# Patient Record
Sex: Female | Born: 1935
Health system: Southern US, Community
[De-identification: ages and names within clinical notes are randomized; demographics above are authoritative.]

## PROBLEM LIST (undated history)

## (undated) DIAGNOSIS — I319 Disease of pericardium, unspecified: Secondary | ICD-10-CM

## (undated) DIAGNOSIS — I4891 Unspecified atrial fibrillation: Secondary | ICD-10-CM

## (undated) DIAGNOSIS — I639 Cerebral infarction, unspecified: Secondary | ICD-10-CM

## (undated) DIAGNOSIS — I10 Essential (primary) hypertension: Secondary | ICD-10-CM

## (undated) DIAGNOSIS — J189 Pneumonia, unspecified organism: Secondary | ICD-10-CM

## (undated) DIAGNOSIS — U071 COVID-19: Secondary | ICD-10-CM

## (undated) DIAGNOSIS — I214 Non-ST elevation (NSTEMI) myocardial infarction: Secondary | ICD-10-CM

## (undated) DIAGNOSIS — M1990 Unspecified osteoarthritis, unspecified site: Secondary | ICD-10-CM

## (undated) DIAGNOSIS — I340 Nonrheumatic mitral (valve) insufficiency: Secondary | ICD-10-CM

## (undated) HISTORY — DX: COVID-19: U07.1

## (undated) HISTORY — DX: Disease of pericardium, unspecified: I31.9

## (undated) HISTORY — DX: Unspecified atrial fibrillation: I48.91

## (undated) HISTORY — DX: Essential (primary) hypertension: I10

## (undated) HISTORY — PX: MITRAL VALVE REPLACEMENT: SHX147

## (undated) HISTORY — DX: Non-ST elevation (NSTEMI) myocardial infarction: I21.4

## (undated) HISTORY — DX: Cerebral infarction, unspecified: I63.9

## (undated) HISTORY — PX: TOTAL ABDOMINAL HYSTERECTOMY W/ BILATERAL SALPINGOOPHORECTOMY: SHX83

## (undated) HISTORY — DX: Nonrheumatic mitral (valve) insufficiency: I34.0

## (undated) HISTORY — DX: Unspecified osteoarthritis, unspecified site: M19.90

---

## 1998-05-07 ENCOUNTER — Other Ambulatory Visit: Admission: RE | Admit: 1998-05-07 | Discharge: 1998-05-07 | Payer: Self-pay | Admitting: Obstetrics & Gynecology

## 1999-05-16 ENCOUNTER — Other Ambulatory Visit: Admission: RE | Admit: 1999-05-16 | Discharge: 1999-05-16 | Payer: Self-pay | Admitting: Obstetrics & Gynecology

## 1999-11-14 ENCOUNTER — Other Ambulatory Visit: Admission: RE | Admit: 1999-11-14 | Discharge: 1999-11-14 | Payer: Self-pay | Admitting: Obstetrics & Gynecology

## 2000-03-19 ENCOUNTER — Encounter: Payer: Self-pay | Admitting: Internal Medicine

## 2000-03-19 ENCOUNTER — Other Ambulatory Visit: Admission: RE | Admit: 2000-03-19 | Discharge: 2000-03-19 | Payer: Self-pay | Admitting: Internal Medicine

## 2000-03-19 ENCOUNTER — Encounter (INDEPENDENT_AMBULATORY_CARE_PROVIDER_SITE_OTHER): Payer: Self-pay | Admitting: Specialist

## 2000-12-21 ENCOUNTER — Other Ambulatory Visit: Admission: RE | Admit: 2000-12-21 | Discharge: 2000-12-21 | Payer: Self-pay | Admitting: Obstetrics & Gynecology

## 2002-02-10 ENCOUNTER — Inpatient Hospital Stay (HOSPITAL_COMMUNITY): Admission: EM | Admit: 2002-02-10 | Discharge: 2002-02-15 | Payer: Self-pay | Admitting: *Deleted

## 2002-02-11 ENCOUNTER — Encounter (INDEPENDENT_AMBULATORY_CARE_PROVIDER_SITE_OTHER): Payer: Self-pay | Admitting: Specialist

## 2002-02-11 ENCOUNTER — Encounter: Payer: Self-pay | Admitting: Internal Medicine

## 2002-02-15 ENCOUNTER — Encounter (INDEPENDENT_AMBULATORY_CARE_PROVIDER_SITE_OTHER): Payer: Self-pay | Admitting: *Deleted

## 2002-06-13 ENCOUNTER — Other Ambulatory Visit: Admission: RE | Admit: 2002-06-13 | Discharge: 2002-06-13 | Payer: Self-pay | Admitting: Obstetrics & Gynecology

## 2003-01-24 ENCOUNTER — Other Ambulatory Visit: Admission: RE | Admit: 2003-01-24 | Discharge: 2003-01-24 | Payer: Self-pay | Admitting: Obstetrics & Gynecology

## 2003-08-14 ENCOUNTER — Encounter: Payer: Self-pay | Admitting: Internal Medicine

## 2003-08-14 ENCOUNTER — Inpatient Hospital Stay (HOSPITAL_COMMUNITY): Admission: AD | Admit: 2003-08-14 | Discharge: 2003-08-18 | Payer: Self-pay | Admitting: Internal Medicine

## 2003-08-15 ENCOUNTER — Encounter: Payer: Self-pay | Admitting: Cardiology

## 2003-08-18 ENCOUNTER — Encounter (INDEPENDENT_AMBULATORY_CARE_PROVIDER_SITE_OTHER): Payer: Self-pay | Admitting: *Deleted

## 2004-01-01 ENCOUNTER — Encounter: Payer: Self-pay | Admitting: Gastroenterology

## 2004-01-01 ENCOUNTER — Encounter (INDEPENDENT_AMBULATORY_CARE_PROVIDER_SITE_OTHER): Payer: Self-pay | Admitting: *Deleted

## 2004-01-01 ENCOUNTER — Inpatient Hospital Stay (HOSPITAL_COMMUNITY): Admission: EM | Admit: 2004-01-01 | Discharge: 2004-01-05 | Payer: Self-pay | Admitting: Emergency Medicine

## 2004-01-04 ENCOUNTER — Encounter: Payer: Self-pay | Admitting: Gastroenterology

## 2004-01-04 ENCOUNTER — Encounter (INDEPENDENT_AMBULATORY_CARE_PROVIDER_SITE_OTHER): Payer: Self-pay | Admitting: Specialist

## 2004-01-05 ENCOUNTER — Encounter (INDEPENDENT_AMBULATORY_CARE_PROVIDER_SITE_OTHER): Payer: Self-pay | Admitting: *Deleted

## 2004-01-25 ENCOUNTER — Ambulatory Visit: Payer: Self-pay | Admitting: Internal Medicine

## 2004-10-15 ENCOUNTER — Inpatient Hospital Stay (HOSPITAL_COMMUNITY): Admission: RE | Admit: 2004-10-15 | Discharge: 2004-10-18 | Payer: Self-pay | Admitting: Obstetrics & Gynecology

## 2004-10-15 HISTORY — PX: ANTERIOR AND POSTERIOR VAGINAL REPAIR: SUR5

## 2005-05-06 ENCOUNTER — Ambulatory Visit: Payer: Self-pay | Admitting: Cardiology

## 2005-05-07 ENCOUNTER — Encounter: Payer: Self-pay | Admitting: Internal Medicine

## 2005-05-07 ENCOUNTER — Inpatient Hospital Stay (HOSPITAL_COMMUNITY): Admission: RE | Admit: 2005-05-07 | Discharge: 2005-05-13 | Payer: Self-pay | Admitting: Cardiology

## 2005-05-07 ENCOUNTER — Ambulatory Visit: Payer: Self-pay | Admitting: Cardiology

## 2005-05-12 ENCOUNTER — Encounter: Payer: Self-pay | Admitting: Cardiology

## 2005-05-21 ENCOUNTER — Ambulatory Visit: Payer: Self-pay | Admitting: Cardiology

## 2005-05-28 ENCOUNTER — Other Ambulatory Visit: Admission: RE | Admit: 2005-05-28 | Discharge: 2005-05-28 | Payer: Self-pay | Admitting: Obstetrics & Gynecology

## 2005-07-31 ENCOUNTER — Ambulatory Visit: Payer: Self-pay | Admitting: Cardiology

## 2005-08-26 ENCOUNTER — Ambulatory Visit: Payer: Self-pay | Admitting: Cardiology

## 2005-10-07 ENCOUNTER — Ambulatory Visit: Payer: Self-pay

## 2005-10-07 ENCOUNTER — Encounter: Payer: Self-pay | Admitting: Cardiology

## 2005-10-07 ENCOUNTER — Ambulatory Visit: Payer: Self-pay | Admitting: Cardiology

## 2005-10-22 ENCOUNTER — Ambulatory Visit: Payer: Self-pay | Admitting: Cardiology

## 2005-11-11 ENCOUNTER — Ambulatory Visit: Payer: Self-pay | Admitting: Cardiology

## 2006-01-08 ENCOUNTER — Ambulatory Visit: Payer: Self-pay | Admitting: Cardiology

## 2006-04-16 ENCOUNTER — Inpatient Hospital Stay (HOSPITAL_COMMUNITY): Admission: RE | Admit: 2006-04-16 | Discharge: 2006-04-19 | Payer: Self-pay | Admitting: Obstetrics and Gynecology

## 2006-04-16 HISTORY — PX: OTHER SURGICAL HISTORY: SHX169

## 2006-04-19 ENCOUNTER — Encounter (INDEPENDENT_AMBULATORY_CARE_PROVIDER_SITE_OTHER): Payer: Self-pay | Admitting: *Deleted

## 2006-06-30 ENCOUNTER — Ambulatory Visit: Payer: Self-pay | Admitting: *Deleted

## 2006-06-30 ENCOUNTER — Emergency Department (HOSPITAL_COMMUNITY): Admission: EM | Admit: 2006-06-30 | Discharge: 2006-06-30 | Payer: Self-pay | Admitting: Emergency Medicine

## 2006-07-17 ENCOUNTER — Ambulatory Visit: Payer: Self-pay | Admitting: Cardiology

## 2006-08-05 ENCOUNTER — Encounter: Payer: Self-pay | Admitting: Cardiology

## 2006-08-05 ENCOUNTER — Ambulatory Visit: Payer: Self-pay

## 2007-01-13 ENCOUNTER — Ambulatory Visit: Payer: Self-pay | Admitting: Cardiology

## 2007-07-26 ENCOUNTER — Ambulatory Visit: Payer: Self-pay | Admitting: Cardiology

## 2008-04-03 ENCOUNTER — Encounter: Admission: RE | Admit: 2008-04-03 | Discharge: 2008-04-03 | Payer: Self-pay | Admitting: Obstetrics & Gynecology

## 2008-08-26 DIAGNOSIS — I10 Essential (primary) hypertension: Secondary | ICD-10-CM | POA: Insufficient documentation

## 2008-09-05 ENCOUNTER — Ambulatory Visit: Payer: Self-pay | Admitting: Cardiology

## 2008-09-05 ENCOUNTER — Ambulatory Visit: Payer: Self-pay

## 2008-09-05 ENCOUNTER — Encounter: Payer: Self-pay | Admitting: Cardiology

## 2008-11-20 ENCOUNTER — Encounter (INDEPENDENT_AMBULATORY_CARE_PROVIDER_SITE_OTHER): Payer: Self-pay | Admitting: *Deleted

## 2009-04-04 ENCOUNTER — Encounter: Payer: Self-pay | Admitting: Cardiology

## 2009-07-31 ENCOUNTER — Encounter (INDEPENDENT_AMBULATORY_CARE_PROVIDER_SITE_OTHER): Payer: Self-pay | Admitting: *Deleted

## 2009-08-21 ENCOUNTER — Ambulatory Visit: Payer: Self-pay | Admitting: Cardiology

## 2009-09-26 DIAGNOSIS — Z8679 Personal history of other diseases of the circulatory system: Secondary | ICD-10-CM | POA: Insufficient documentation

## 2009-09-26 DIAGNOSIS — Z8719 Personal history of other diseases of the digestive system: Secondary | ICD-10-CM | POA: Insufficient documentation

## 2009-09-26 DIAGNOSIS — Z860100 Personal history of colon polyps, unspecified: Secondary | ICD-10-CM | POA: Insufficient documentation

## 2009-09-26 DIAGNOSIS — I2789 Other specified pulmonary heart diseases: Secondary | ICD-10-CM

## 2009-09-26 DIAGNOSIS — K5730 Diverticulosis of large intestine without perforation or abscess without bleeding: Secondary | ICD-10-CM | POA: Insufficient documentation

## 2009-10-02 ENCOUNTER — Ambulatory Visit: Payer: Self-pay | Admitting: Internal Medicine

## 2009-11-27 ENCOUNTER — Telehealth: Payer: Self-pay | Admitting: Internal Medicine

## 2009-11-29 ENCOUNTER — Ambulatory Visit: Payer: Self-pay | Admitting: Internal Medicine

## 2009-12-01 ENCOUNTER — Encounter: Payer: Self-pay | Admitting: Internal Medicine

## 2010-03-31 ENCOUNTER — Encounter: Payer: Self-pay | Admitting: Obstetrics & Gynecology

## 2010-04-11 NOTE — Letter (Signed)
Summary: New Patient letter  [facility]  [address] [place], [place] [address]   Phone: [phone]  Fax: [phone]       07/31/2009 MRN: [id number]  [place], [place]  [address]  Dear [name],  Welcome to the Gastroenterology Division at [facility].    You are scheduled to see Dr.  Olevia Perches on 10/02/2009 at  9:15am on the 3rd floor at [facility], [place] [employer].  We ask that you try to arrive at our office 15 minutes prior to your appointment time to allow for check-in.  We would like you to complete the enclosed self-administered evaluation form prior to your visit and bring it with you on the day of your appointment.  We will review it with you.  Also, please bring a complete list of all your medications or, if you prefer, bring the medication bottles and we will list them.  Please bring your insurance card so that we may make a copy of it.  If your insurance requires a referral to see a specialist, please bring your referral form from your primary care physician.  Co-payments are due at the time of your visit and may be paid by cash, check or credit card.     Your office visit will consist of a consult with your physician (includes a physical exam), any laboratory testing he/she may order, scheduling of any necessary diagnostic testing (e.g. x-ray, ultrasound, CT-scan), and scheduling of a procedure (e.g. Endoscopy, Colonoscopy) if required.  Please allow enough time on your schedule to allow for any/all of these possibilities.    If you cannot keep your appointment, please call [phone] to cancel or reschedule prior to your appointment date.  This allows [place] the opportunity to schedule an appointment for another patient in need of care.  If you do not cancel or reschedule by 5 p.m. the business day prior to your appointment date, you will be charged a $50.00 late cancellation/no-show fee.    Thank you for choosing  [facility] for your medical needs.  We appreciate the opportunity to care for you.  Please visit [place] at our website  to learn more about our practice.                     Sincerely,                                                             The Gastroenterology Division

## 2010-04-11 NOTE — Discharge Summary (Signed)
Summary: Acute Colitis   NAME:  [name]                              ACCOUNT NO.:  [id number]   MEDICAL RECORD NO.:  [id number]                   PATIENT TYPE:  INP   LOCATION:  76                                 FACILITY:  [facility]   PHYSICIAN:  Docia Chuck. Henrene Pastor, M.D. [facility]             DATE OF BIRTH:  1928/05/24   DATE OF ADMISSION:  08/14/2003  DATE OF DISCHARGE:  08/18/2003                                 DISCHARGE SUMMARY   ADMITTING DIAGNOSES:  1. History of ischemic colitis, December 2003.  2. Acute colitis, suspect recurrent ischemic colitis.  3. History adenomatous colon polyps, hemorrhoids, and left sided     diverticulosis.  4. Hypertension.  5. Transient ischemic attack in the early 1990s.  6. History abnormal Pap smear for which she underwent total abdominal     hysterectomy and salpingo-oophorectomy.  Biopsy, pathology at that time     is not known.  7. Questionable status post appendectomy, possibly done at the time of     hysterectomy and not clarified by an abdominal pelvic computed tomography     scan of 2003.  8. Calcified gallstones seen on computed tomography 2003, incidental     finding.  9. Granulomatous changes in the liver and spleen, again incidental finding     seen on computed tomography, December 2003.  10.      Degenerative joint disease, especially involving the right foot.  11.      Heart murmur, specific type of valve abnormality not known.  12.      Allergy to codeine and sulfa.  She is not allergic to Tylenol or     hyoscyamine as has been listed in the chart at some point.  14.      History of scarlet fever, age 4.  48.      Osteopenia, question osteoporosis, currently being managed with     hormone replacement therapy.   DISCHARGE DIAGNOSES:  1. Recurrent ischemic colitis, resolving.  2. Heart murmur with echocardiogram this admission revealing left ventricle     ejection fraction of 55-65%, mild aortic valve regurgitation, and mild     mitral  valve prolapse with mild to moderate mitral valve regurgitation.  3. Carotid bruits.  Carotid Dopplers showing no stenosis but did show     tortuous vessels bilaterally.  4. Hypokalemia, resolved.  5. Hypertension, dosages of medications adjusted during this admission.  6. Osteopenia.  Have discontinued Premarin due to its risk for causing     ischemic colitis.  Have added calcium to her medical regimen.  7. No evidence for hyperlipidemia, see labs for results of the lipid     profile.  8. Degenerative disk disease involving the lumbar spine.  Finding discussed     with orthopedist, Dr. Eulas Post, who said that gas is a normal product of     breakdown of disk material and  can be disregarded.   PROCEDURES:  None.   CONSULTATIONS:  Dr. Ethelle Lyon evaluated the patient for heart murmur  and hypertension.   BRIEF HISTORY:  [name] is a very pleasant 77 year old white female who  lives in [place].  She has had prior colonoscopy, as recently as January  2002, showing left sided diverticulosis, colon polyps, and hemorrhoids.  The  polyp was a tubular adenoma.  In December 2003, she had ischemic colitis.  She had been using Premarin at that time and it was briefly discontinued,  but because of osteopenia it had been restarted.  She also has hot flashes  and gets very anxious when she is not on her hormone replacement therapy.  The patient came to see Dr. Olevia Perches in the office, on June 6th, with  complaints of left sided abdominal pain and diarrhea that had become bloody.  These symptoms had begun the evening prior to her visit to the office.  By  the time she got to the office, her stools were pretty much purely blood of  a mild to moderate amount.  She was afebrile and not dizzy.  Dr. Olevia Perches  thought that she may very well have recurrent ischemic colitis and admitted  her to the hospital.  She had not been using any nonsteroidal's but had been  using low dose aspirin daily.  The patient  does not have any history of  upper GI pathology and does not require use of proton pump inhibitors or H2  blockers.   LABORATORIES:  White blood cell count 9.9, hemoglobin 13, hematocrit 37.7,  MCV 91.4, and platelets of 220,000.  Sed rate 7.  PT 13, INR 1, PTT 28.  Sodium 138, potassium 3.3 - corrected 3.9, glucose 96-102, BUN 9, creatinine  0.7.  Total bilirubin 0.7, alkaline phosphatase 46, AST 18, ALT 13, albumin  3.2.  Total cholesterol 187, triglycerides 144, HDL cholesterol 61, total  cholesterol/HDL ratio 3.1, VLDL cholesterol 29, and LDL cholesterol 97.  All  of these numbers are within normal limits.  TSH level 1.823.  Urinalysis was  negative except for a few bacteria but no leukocyte esterase, nitrites, or  RBC's, WBCs 0-2.   Echocardiogram, findings described above.   The EKG showed a normal sinus rhythm at 61 beats per minute, along with  incomplete right bundle branch block, and LVH with repolarization  abnormalities.   CT scan of the abdomen showed incidental cholelithiasis - uncomplicated,  calcifications in the spleen consistent with prior granulomatous disease and  incidental right renal cyst.  Pelvic views with diffuse thickening of  descending and sigmoid colon with associated inflammatory change in the  surrounding tissues and are consistent with colitis either from infectious  or ischemic etiology.   HOSPITAL COURSE:  1. Ischemic colitis.  The patient was admitted to the hospital and underwent     CT scan with contrast.  This demonstrated and confirmed colitis.  White     cell count was normal.  She was not treated with antibiotics because of     the lack of suspicion for this being infectious.  She was afebrile.  Diet     was initially limited to sips of clear liquids and ultimately advanced     over the course of her five day hospitalization to low residue diet, all    of which she tolerated.  Her bloody stools resolved.  By the time she was      discharged, she was having small  amounts of soft formed bowel movement.     The pain subjectively and tenderness to exam et al. improved.  She was     stable for discharge and was to return home on hospital day five.     Because of suspicion that the Premarin which she uses can be associated     with ischemic colitis, this medication was discontinued.  A call was     placed into Dr. Nori Riis, her gynecologist, office and arrangements were to     be made by Dr. Nori Riis for the patient to follow up in his office following     discharge.  Apparently, she is taking the Premarin for osteopenia.  She     also expresses some symptomatology in terms of hot flashes and an     increase in nervous anxiety when she has been taken off the Premarin in     the past.  2. Heart murmur.  This heart murmur had apparently never been evaluated and     is a late systolic murmur.  Echocardiogram was obtained, and this showed     mitral valve prolapse with associated regurgitation as well as some mild     aortic valve regurgitation.  LV function was preserved.  Cardiology     assessed this and determined that she would from here on in require     antibiotic prophylaxis for dental and medical procedures.  3. History of hypertension.  Because of the new findings of valvular     regurgitation, Dr. Albertine Patricia felt that the patient would be best served by     controlling her blood pressure with ACE inhibitor; therefore, the ACE     inhibitor, benazepril, was continued and the dose was increased from 10     to 20 mg daily.  Verapamil was discontinued because she was having     periods where her blood pressure was in the 50s and it was not felt to be     as beneficial as an ACE inhibitor in her situation.  4. Hypokalemia.  The potassium was low when it was initially checked.     Potassium was added to her IV fluids and the potassium normalized.  5. Osteopenia.  As above, she had been being treated for this with Premarin     and  again this medication was discontinued.  We did add calcium with     vitamin D and magnesium to her medical regimen, and she should take 1500     mg of calcium daily.  She will followup with Dr. Nori Riis in the office to     discuss any other plans for treatment of her osteopenia.  Apparently, she     has tried Fosamax and Actonel in the past, but they have both caused her     to become hoarse, and they were discontinued.  She did not have any     esophageal pain or difficulty swallowing with these medications in the     past, however.  6. Carotid bruits.  A carotid Doppler study revealed no carotid stenosis,     but she did have tortuous vessels bilaterally.  7. Degenerative joint disease.  This is asymptomatic.  The patient does not     have significant low back pain in the lumbar region.  On the initial     verbal report from the CT scan there was some mention of some gas within    some herniated material in the lumbar  disk area.  This was not quoted on     the official dictated report; however, a call was placed to Dr. Eulas Post,     from orthopedics, and he said that this was not an uncommon finding in a     degenerating disk and was not anything to worry about, especially in an     asymptomatic patient.   CONDITION AT DISCHARGE:  Stable.   FOLLOWUP PLANS:  1. The patient does not need to see Dr. Olevia Perches in the office unless she is     having problems, and she should call her p.r.n.  She will be continued on     a every three to five year schedule for screening colonoscopies.  2. The patient was going to be calling Dr. Verlon Au office in order to obtain     and appointment to see him within the next 2-4 weeks.  3. She was advised to go up to [place] to have     her blood pressure checked within the next 3-4 weeks.   MEDICATIONS AT DISCHARGE:  1. She was to stop taking Verapamil and Premarin.  2. She was restart aspirin 81 mg daily on June 20th.  3. Calcium  supplementation which contained vitamin D and magnesium 1500 mg     daily.  She was advised that calcium citrate tends to cause less     constipation than calcium carbonate.  The Centrum Silver, depending on     how much calcium is contained within it may count for one of her doses of     calcium, but she should get a total of 1500 mg of calcium daily between     the calcium supplements and her multivitamin.  4. Lotensin 20 mg daily.  5. Tylenol Extra Strength as needed.  6. Hyoscyamine 0.375 mg one pill b.i.d. p.r.n. crampy abdominal pain.  A     prescription for six pills with one refill was provided as it was not     felt she would need this medication for very long, if at all.   DIET:  Low-fiber for the next two weeks and then regular foods.  She was  updated on current thinking as to treatment of diverticulosis with diet.  Current sentiment is that small seeds and nuts are okay to use, and she had  previously been following a seed and nut restricted diet but would be able  to add these back once she was taking a regular diet again.   SPECIAL INSTRUCTIONS:  She was advised to remember that if she has dental or  medical invasive procedures that she should request prophylactic antibiotics  from her physicians and dentist.      Azucena Freed, P.A. [facility]                   John N. Henrene Pastor, M.D. [facility]    SG/MEDQ  D:  08/18/2003  T:  08/19/2003  Job:  [id number]   cc:   Chipper Herb, M.D.  [place]  [place] [address]  Fax: [phone]   Delfin Edis, M.D. [facility]   Maisie Fus, M.D.  [address] [place]  [place] [address]  Fax: [phone]

## 2010-04-11 NOTE — Assessment & Plan Note (Signed)
Summary: Gastroenterology  ALYDA NIER MR#:  [id number] Page #  NAME:  [name], [name]  OFFICE NO:  [id number]  DATE:  01/25/04  DOB:  Apr 10, 2028  The patient is a very nice 77 year old white female with recurrent ischemic colitis requiring second hospitalization in the last 6 months from 10/24 to 10/28 with recurrent ischemic colitis, which has been attributed to, most likely, her antihypertensive medications. She has labile hypertension, and her medications were adjusted to control her blood pressure, but she developed recurrent ischemic colitis. This was documented on a colonoscopy by Dr. Fuller Plan in October 2005. I have previously done a colonoscopy on her in January 2002. She is now recovering, having normal bowel movements, minimal abdominal pain, no fever, and no rectal bleeding. Her medications have been adjusted to verapamil 240 mg 1/2 tablet a day and Lotensin 10 mg a day. She was also switched from Premarin tablets to Climara patch 0.025 mg weekly.   PHYSICAL EXAMINATION:  Blood pressure 162/70.   Pulse 70.  Weight 118 pounds, which is unchanged.  She was alert, oriented, in no distress.  Lungs were clear to auscultation.  COR with normal S1, normal S2.  Abdomen was soft but still very tender in left lower and middle quadrants. No rebound. Normoactive bowel sounds. No distension. Her rectal exam today shows a small amount of hard Hemoccult-negative stool.   IMPRESSION:  A 77 year old white female recovering from second attack of ischemic colitis, status post adjustment of her antihypertensive medications.   PLAN:   1.  I advised patient to purchase a blood pressure cuff and check her blood pressures daily. If her systolic blood pressure is less than 123456 systolic, she should hold her blood pressure medications.  2.  Salt restriction.  3.  Increase fiber in her diet.  4.  Continue Climara patch.  5.  Patient should be allowed to run systolic blood pressures to 160 up to 170 before adjusting her  medications to prevent hypotensive episodes.  6.  She will be followed by Dr. Elana Alm. We will see her on a p.r.n. basis.      Lowella Bandy. Olevia Perches, M.D.  [id number]/cha004/lj cc:  Dr. Elana Alm, [facility] with Dr. Morrie Sheldon.  D:  01/25/04; T:  ; Job [id number]

## 2010-04-11 NOTE — Letter (Signed)
Summary: [facility] Instructions  [facility]  [place], [place] [address]   Phone: [phone]  Fax: [phone]       CAMYRA CHAMPA    17-Aug-1928    MRN: [id number]       Procedure Day /Date: 11/29/09 Thursday     Arrival Time: 7:30 am     Procedure Time: 8:00am     Location of Procedure:                    _x _  [facility] (4th Floor)  [place]  Starting 5 days prior to your procedure (11/25/09) do not eat nuts, seeds, popcorn, corn, beans, peas,  salads, or any raw vegetables.  Do not take any fiber supplements (e.g. Metamucil, Citrucel, and Benefiber). ____________________________________________________________________________________________________   THE DAY BEFORE YOUR PROCEDURE         DATE: 11/28/09 DAY: Wednesday  1   Drink clear liquids the entire day-NO SOLID FOOD  2   Do not drink anything colored red or purple.  Avoid juices with pulp.  No orange juice.  3   Drink at least 64 oz. (8 glasses) of fluid/clear liquids during the day to prevent dehydration and help the prep work efficiently.  CLEAR LIQUIDS INCLUDE: Water Jello Ice Popsicles Tea (sugar ok, no milk/cream) Powdered fruit flavored drinks Coffee (sugar ok, no milk/cream) Gatorade Juice: apple, white grape, white cranberry  Lemonade Clear bullion, consomm, broth Carbonated beverages (any kind) Strained chicken noodle soup Hard Candy  4   Mix the entire bottle of Miralax with 64 oz. of Gatorade/Powerade in the morning and put in the refrigerator to chill.  5   At 3:00 pm take 2 Dulcolax/Bisacodyl tablets.  6   At 4:30 pm take one Reglan/Metoclopramide tablet.  7  Starting at 5:00 pm drink one 8 oz glass of the Miralax mixture every 15-20 minutes until you have finished drinking the entire 64 oz.  You should finish drinking prep around 7:30 or 8:00 pm.  8   If you are nauseated, you may take the 2nd Reglan/Metoclopramide tablet  at 6:30 pm.        9    At 8:00 pm take 2 more DULCOLAX/Bisacodyl tablets.        THE DAY OF YOUR PROCEDURE      DATE:  11/29/09 DAY: Thursday  You may drink clear liquids until 6:00 am  (2 HOURS BEFORE PROCEDURE).   MEDICATION INSTRUCTIONS  Unless otherwise instructed, you should take regular prescription medications with a small sip of water as early as possible the morning of your procedure.        OTHER INSTRUCTIONS  You will need a responsible adult at least 75 years of age to accompany you and drive you home.   This person must remain in the waiting room during your procedure.  Wear loose fitting clothing that is easily removed.  Leave jewelry and other valuables at home.  However, you may wish to bring a book to read or an iPod/MP3 player to listen to music as you wait for your procedure to start.  Remove all body piercing jewelry and leave at home.  Total time from sign-in until discharge is approximately 2-3 hours.  You should go home directly after your procedure and rest.  You can resume normal activities the day after your procedure.  The day of your procedure you should not:   Drive   Make legal  decisions   Operate machinery   Drink alcohol   Return to work  You will receive specific instructions about eating, activities and medications before you leave.   The above instructions have been reviewed and explained to me by  Madlyn Frankel CMA Deborra Medina)  October 02, 2009 75:29 AM     I fully understand and can verbalize these instructions _____________________________ Date 10/02/73

## 2010-04-11 NOTE — Assessment & Plan Note (Signed)
Summary: yearly  Medications Added AMOXICILLIN 500 MG CAPS (AMOXICILLIN) as needed for dental work        Visit Type:  1 yr f/u Primary Provider:  Herbie Baltimore Day MD  CC:  pt c/o pain between shoulder blades when she is cooking...otherwise no other complaints today.  History of Present Illness: [name] returns today for evaluation and management of her history of mitral valve repair, hypertension, first-degree AV block incomplete right bundle branch block, postoperative atrial for ablation and postoperative pericarditis.  She's doing remarkably well. She has no symptoms of chest pain, orthopnea, PND, palpitations, syncope or presyncope, or extremity edema.  She does have some sharp pain in between her shoulder blades when she's cooking dinner. It happens at no other time.  Her blood pressure done under excellent control.  Her last echocardiogram was in June 2010. At that time showed normal left ventricular function, no LVH, mild left atrial dilatation, mild mitral regurgitation, mild to moderate tricuspid regurgitation.  Current Medications (verified): 1)  Avapro 300 Mg Tabs (Irbesartan) .Marland Kitchen.. 1 Tab Once Daily 2)  Metoprolol Tartrate 50 Mg Tabs (Metoprolol Tartrate) .Marland Kitchen.. 1 Tab Two Times A Day 3)  Estrace 0.1 Mg/gm Crea (Estradiol) .... Use As Directed 4)  Vivelle-Dot 0.025 Mg/24hr Pttw (Estradiol) .... Change Patch 2 X Weekly 5)  Stool Softner .... As Needed 6)  Vitamin D3 1000 Unit Caps (Cholecalciferol) .... Once Daily 7)  Hydrochlorothiazide 25 Mg Tabs (Hydrochlorothiazide) .Marland Kitchen.. 1 Tab Once Daily 8)  Amoxicillin 500 Mg Caps (Amoxicillin) .... As Needed For Dental Work  Allergies: 1)  ! Sulfa 2)  ! Codeine  Review of Systems       negative other history of present illness  Vital Signs:  Patient profile:   77 year old female Height:      63 inches Weight:      121 pounds BMI:     21.51 Pulse rate:   79 / minute Pulse rhythm:   irregular BP sitting:   116 / 70  (left  arm) Cuff size:   large  Vitals Entered By: Julaine Hua, CMA (August 21, 2009 10:11 AM)  Physical Exam  General:  Well developed, well nourished, in no acute distress. Head:  normocephalic and atraumatic Eyes:  PERRLA/EOM intact; conjunctiva and lids normal. Neck:  Neck supple, no JVD. No masses, thyromegaly or abnormal cervical nodes. Chest [name]:  no deformities or breast masses noted Lungs:  Clear bilaterally to auscultation and percussion. Heart:  PMI nondisplaced, regular rate and rhythm, no mitral regurgitation appreciated Abdomen:  Bowel sounds positive; abdomen soft and non-tender without masses, organomegaly, or hernias noted. No hepatosplenomegaly. Msk:  Back normal, normal gait. Muscle strength and tone normal. Pulses:  pulses normal in all 4 extremities Extremities:  No clubbing or cyanosis. Neurologic:  Alert and oriented x 3. Skin:  Intact without lesions or rashes. Psych:  Normal affect.   EKG  Procedure date:  08/21/2009  Findings:      normal sinus rhythm, first-degree AV block, incomplete right bundle branch block, no change  Impression & Recommendations:  Problem # 1:  MITRAL REGURGITATION, SEVERE/.S/P MITRAL VALVE REPAIR (ICD-396.3) Assessment Unchanged  Problem # 2:  HYPERTENSION, UNSPECIFIED (ICD-401.9) Assessment: Improved  Her updated medication list for this problem includes:    Avapro 300 Mg Tabs (Irbesartan) .Marland Kitchen... 1 tab once daily    Metoprolol Tartrate 50 Mg Tabs (Metoprolol tartrate) .Marland Kitchen... 1 tab two times a day    Hydrochlorothiazide 25 Mg Tabs (Hydrochlorothiazide) .Marland KitchenMarland KitchenMarland KitchenMarland Kitchen  1 tab once daily  Problem # 3:  ATRIAL FIBRILLATION/POSTOPERATIVE (ICD-427.31) Assessment: Improved  Her updated medication list for this problem includes:    Metoprolol Tartrate 50 Mg Tabs (Metoprolol tartrate) .Marland Kitchen... 1 tab two times a day  Orders: EKG w/ Interpretation (93000)  Problem # 4:  PERICARDITIS/POSTOPERATIVE (ICD-423.9) Assessment: Improved  Her updated  medication list for this problem includes:    Avapro 300 Mg Tabs (Irbesartan) .Marland Kitchen... 1 tab once daily    Hydrochlorothiazide 25 Mg Tabs (Hydrochlorothiazide) .Marland Kitchen... 1 tab once daily    Amoxicillin 500 Mg Caps (Amoxicillin) .Marland Kitchen... As needed for dental work  Patient Instructions: 1)  Your physician recommends that you schedule a follow-up appointment in: 1 year with Dr. Verl Blalock 2)  Your physician recommends that you continue on your current medications as directed. Please refer to the Current Medication list given to you today.

## 2010-04-11 NOTE — Procedures (Signed)
Summary: Colonoscopy  Patient: Brittlynn Chanda Note: All result statuses are Final unless otherwise noted.  Tests: (1) Colonoscopy (COL)   COL Colonoscopy           [facility] [employer].     [place], [place]  [address]           COLONOSCOPY PROCEDURE REPORT           PATIENT:  [name], [place]  MR#:  [id number]     BIRTHDATE:  Jan 14, 1929, 26 yrs. old  GENDER:  female     ENDOSCOPIST:  Lowella Bandy. Olevia Perches, MD     REF. BY:  Redge Gainer, M.D.     PROCEDURE DATE:  11/29/2009     PROCEDURE:  Colonoscopy [id number]     ASA CLASS:  Class II     INDICATIONS:  Elevated Risk Screening family hx of colon cancer in     a direct relative     adenom. polyp 2002, no polyp in 2005     MEDICATIONS:   Versed 5 mg, Fentanyl 50 mcg           DESCRIPTION OF PROCEDURE:   After the risks benefits and     alternatives of the procedure were thoroughly explained, informed     consent was obtained.  Digital rectal exam was performed and     revealed no rectal masses.   The LB PCF-H180AL [id number] endoscope     was introduced through the anus and advanced to the cecum, which     was identified by both the appendix and ileocecal valve, without     limitations.  The quality of the prep was good, using MiraLax.     The instrument was then slowly withdrawn as the colon was fully     examined.     <<PROCEDUREIMAGES>>           FINDINGS:  A sessile polyp was found. 5 mm sessile polyp at 120 cm     The polyp was removed using cold biopsy forceps (see image7).     Severe diverticulosis was found in the sigmoid to descending colon     segments (see image5, image9, and image10).  This was otherwise a     normal examination of the colon (see image11 and image8).     Retroflexed views in the rectum revealed no abnormalities.    The     scope was then withdrawn from the patient and the procedure     completed.           COMPLICATIONS:  None     ENDOSCOPIC IMPRESSION:     1) Sessile polyp     2) Severe  diverticulosis in the sigmoid to descending colon     segments     3) Otherwise normal examination     photo of the appendiceal opening not retrieved but it was seen     during the exam     RECOMMENDATIONS:     1) Await pathology results     REPEAT EXAM:  In 0 year(s) for.  no recall due to age           ______________________________     Lowella Bandy. Olevia Perches, MD           CC:           n.     eSIGNED:   Lowella Bandy. Brodie at 11/29/2009 08:37 AM  Chinda, [place], [id number]  Note: An exclamation mark (!) indicates a result that was not dispersed into the flowsheet. Document Creation Date: 11/29/2009 8:37 AM _______________________________________________________________________  (1) Order result status: Final Collection or observation date-time: 11/29/2009 08:28 Requested date-time:  Receipt date-time:  Reported date-time:  Referring Physician:   Ordering Physician: Delfin Edis [phone]) Specimen Source:  Source: Tawanna Cooler Order Number: [id number] Lab site:   Appended Document: Colonoscopy no recall due to age

## 2010-04-11 NOTE — Letter (Signed)
Summary: Patient Notice- Polyp Results  [facility]  [address] [place], [place] [address]   Phone: [phone]  Fax: [phone]        December 01, 2009 MRN: [id number]    [place], [place]  [address]    Dear Ms. Vanantwerp,  I am pleased to inform you that the colon polyp(s) removed during your recent colonoscopy was (were) found to be benign (no cancer detected) upon pathologic examination.The Polyp was adenomatous (benigh) x  Should you develop new or worsening symptoms of abdominal pain, bowel habit changes or bleeding from the rectum or bowels, please schedule an evaluation with either your primary care physician or with me.  Additional information/recommendations:  __ No further action with gastroenterology is needed at this time. Please      follow-up with your primary care physician for your other healthcare      needs.  __ Please call [phone] to schedule a return visit to review your      situation.  __ Please keep your follow-up visit as already scheduled.  __ Continue treatment plan as outlined the day of your exam.  Please call us if you are having persistent problems or have questions about your condition that have not been fully answered at this time.  Sincerely,  Lafayette Dragon MD  This letter has been electronically signed by your physician.  Appended Document: Patient Notice- Polyp Results letter mailed

## 2010-04-11 NOTE — Procedures (Signed)
Summary: COLON   Colonoscopy  Procedure date:  03/19/2000  Findings:      Location:  [facility].   Patient Name: [name], [name] MRN: [id number] Procedure Procedures: Colonoscopy CPT: B7970758.    with polypectomy. CPT: X5071110.  Personnel: Endoscopist: Chantille Navarrete L. Olevia Perches, MD.  Referred By: Maisie Fus, M.D.  Exam Location: Exam performed in Outpatient Clinic. Outpatient  Patient Consent: Procedure, Alternatives, Risks and Benefits discussed, consent obtained, from patient.  Indications  Increased Risk Screening: For family history of colorectal neoplasia, in  sibling  History  Current Medications: Patient is taking an non-steroidal medication.  Pre-Exam Physical: Performed Mar 19, 2000. Cardio-pulmonary exam, Rectal exam, HEENT exam , Abdominal exam, Extremity exam, Neurological exam, Mental status exam WNL.  Exam Exam: Extent of exam reached: Cecum, extent intended: Cecum.  Colon retroflexion performed. Images taken. ASA Classification: II. Tolerance: excellent.  Monitoring: Pulse and BP monitoring, Oximetry used. Supplemental O2 given.  Colon Prep Used Phospho Soda for colon prep. Prep results: excellent.  Sedation Meds: Fentanyl 100 mcg. Versed 7 mg.  Findings POLYP: Hepatic Flexure, Maximum size: 5 mm. diminutive, sessile polyp. Procedure:  biopsy without cautery, removed, Polyp sent to pathology. ICD9: Colon Polyps: 211.3.  - DIVERTICULOSIS: Descending Colon to Sigmoid Colon. Not bleeding. ICD9: Diverticulosis: 562.10.   Assessment Abnormal examination, see findings above.  Diagnoses: 562.10: Diverticulosis.  211.3: Colon Polyps.   Events  Unplanned Interventions: No intervention was required.  Unplanned Events: There were no complications. Plans  Post Exam Instructions: No aspirin or non-steroidal containing medications: 2 weeks.  Medication Plan: Await pathology.  Patient Education: Patient given standard instructions for: Polyps.  Diverticulosis.  Disposition: After procedure patient sent to recovery. After recovery patient sent home.  Scheduling/Referral: Follow-Up prn.   This report was created from the original endoscopy report, which was reviewed and signed by the above listed endoscopist.    FINAL DIAGNOSIS    ***MICROSCOPIC EXAMINATION AND DIAGNOSIS***    COLON, HEPATIC FLEXURE POLYP: TUBULAR ADENOMA. NO HIGH GRADE   DYSPLASIA OR MALIGNANCY IDENTIFIED.    smr   Date Reported: 03/20/2000 Chrystie Nose. Saralyn Pilar, MD   *** Electronically Signed Out By JDP ***    Clinical information   R/O adenoma. (tmc)    specimen(s) obtained   Colon, polyp @ hepatic flexure    Gross Description   Received in formalin is a tan, soft tissue fragment that is   submitted in toto. Size: 0.2 (SSW:atb 1/10)    ab/

## 2010-04-11 NOTE — Discharge Summary (Signed)
Summary: Hematochezia....w/addendum    NAME:  [name], [name]                              ACCOUNT NO.:  [id number]   MEDICAL RECORD NO.:  [id number]                   PATIENT TYPE:  INP   LOCATION:  5151                                 FACILITY:  [facility]   PHYSICIAN:  Malcolm T. Fuller Plan, M.D. [facility]          DATE OF BIRTH:  [birth year]   DATE OF ADMISSION:  02/10/2002  DATE OF DISCHARGE:  02/15/2002                                 DISCHARGE SUMMARY   ADMITTING DIAGNOSES:  1. Hematochezia with abdominal pain.  Rule out diverticular bleed, rule out     ischemic colitis, rule out infectious colitis, rule out diverticulitis.  2. Hematuria, question as to whether this is contaminant from rectal     bleeding or urologic in origin.  3. Right foot pain.  4. Mild hypokalemia.  5. History of hypertension controlled on multiple medications.  6. History of colon polyps.  7. History of colon diverticulosis.  8. Transient ischemic attack in the early 1990s.  9. History abnormal Pap smear with questionable cervical cancer.  Status     post total abdominal hysterectomy with salpingo-oophorectomy.  10.      Questionable appendectomy at the time of her pelvic surgery.   DISCHARGE DIAGNOSES:  1. Colitis, probably ischemic.  2. Hematochezia and abdominal pain secondary to colitis, resolved.  3. Hematuria.  A urine cytology is showing atypical cells will need urologic     followup.  4. Hypertension.  The patient is normotensive on all of her blood pressure     medicines except for hydrochlorothiazide which we will continue to hold.  5. Ongoing hormone therapy since the time of her hysterectomy.  Now     discontinued secondary to its being a risk factor for ischemic colitis.     Question as to whether the patient had cervical cancer or just abnormal     Pap smear leading to her hysterectomy/oophorectomy.  6. Hypokalemia, resolved.  7. Mild, normocytic anemia.  The patient was not started on any iron  supplementation.   PROCEDURES:  None.   CONSULTATIONS:  None.   BRIEF HISTORY:  The patient is a pleasant and generally active and healthy  77 year old white female with treated hypertension.  She had had prior  colonoscopy by Dr. Olevia Perches as recently as January 2002 showing left-sided  diverticulosis, hemorrhoids and a tubular adenomatous polyp in the hepatic  flexure.  The patient take a baby aspirin every day for remote history of  TIAs.   On the evening prior to this admission she developed nausea, vomiting, and  loose stools, she did not throw up any bloody or coffee-grounds material and  stopped vomiting within 2 or 3 hours.  Later that evening she developed  stool mixed with blood and later than that through the following morning she  started passing purely bloody stools and developed moderately severe left  lower quadrant pain.  She was  seen by Dr. Sherrlyn Hock at [facility], white count there was around 16,000, hemoglobin was 14.  She was referred to Dr. Olevia Perches and was sent to the emergency room at [facility] where she was evaluated and admitted with abdominal pain and  hematochezia.  Pulse was 126 and blood pressure 137/64.  She was afebrile  and had no history of sweats or chills.   LABORATORIES:  Initial hemoglobin 12.3, it was 11.1 at discharge.  Hematocrit 35.7 on admission, 32.2 at discharge.  MCV was 89.4.  Platelets  ranging 195-248.  PT 13.1, INR 0.9, PTT 29.  Potassium initially 3.0  corrected to 3.8.  Sodium 137.  Glucose was as high as 140, it was 97 once  IV fluids were discontinued.  BUN was 16.  Creatinine 0.9.  Calcium low at  7.6.  Albumin 2.0.  Total bilirubin 0.8, alkaline phosphatase 52, AST 22,  ALT 16.  Lipase was 18.  Urinalysis showed a red appearance of turbid urine.  There was a large amount of hemoglobin, small amount of bilirubin. There was  a small amount of leukocyte esterase.  Rare epithelial cells, only 0-2 white  blood  cells.  Red blood cells were too numerous too count.  There were a few  bacteria.  Urine culture was negative.  Urine cytology showed atypical  urothelial cells.  This may be secondary to lithiasis, inflammation or  instrumentation but a low-grade papillary urothelial neoplasm cannot be  excluded.   HOSPITAL COURSE:  The patient was admitted to unit 5100.  She was  hemodynamically stable and was supported with IV fluids.  She did have  temperatures to a maximum of 101.1 within the first 24 hours of  hospitalization.  She then had CT scan showing transverse and descending  colitis.  Given her overall clinical presentation this was felt to be  ischemic in nature.  She was however, treated for a few days with Cipro and  Flagyl just in case it was infectious.   From a colitis standpoint, the patient had slow but steady resolution of  abdominal pain.  Her hematochezia resolved within 36 hours.  Towards the end  of her hospitalization the patient was not having any stools or any  bleeding.  She was tolerating first clear liquids and then a solid diet  without difficulty.  She was afebrile following the initial 24 hours of  hospitalization.   The patient mentioned that she had seen some blood when wiping after  urinating prior to her coming to the hospital.  It was thought that maybe  this was contaminant from rectal bleeding.  However, she did display gross  hematuria in the hospital on one occasion and urine cytologies were then  sent.  These have confirmed that she does have atypical cells and she will  need further followup of this, therefore, an appointment was made for her to  see Dr. Rana Snare Friday, February 25, 2002 in his office for further  evaluation.   Regarding the patient's chronic medications, these were reviewed and the  Premarin was discontinued because this is a known risk factor for ischemic colitis.  The patient was started on this long ago when she had  hysterectomy  and recently the dose of Premarin had been decreased by her physician.  At  this point being that she is in her 34s it seems an unnecessary drug and  will be discontinued.  The patient's blood pressure medications of Lotensin  and verapamil were continued.  However, her hydrochlorothiazide was held.  Blood pressure monitoring here in the hospital showed that with just the  Lotensin and verapamil that her blood pressure was well controlled  therefore, the hydrochlorothiazide was not restarted.  Hypotension, even  transient, is a risk factor for ischemic colitis so her blood pressure will  need to be monitored by her family doctor and if she becomes hypertensive  the hydrochlorothiazide could be restarted but for now she will not take it.   Other medications which she was advised to avoid for the next 3 weeks were  Advil which she takes p.r.n. and baby aspirin which she was taking daily.   CONDITION AT DISCHARGE:  Stable and improved.   FOLLOW UP APPOINTMENT:  She has an appointment Wednesday, March 23, 2002  at 2:40 a.m. with Delfin Edis in the office.  She has an appointment with  Dr. Rana Snare on Friday, February 25, 2002 at 11 a.m. in his office.  She  is to make appointments with her family doctor, Dr. Olevia Bowens, for future primary  care followups.   MEDICATIONS AT DISCHARGE:  1. Lotensin 10 mg p.o. q.d.  2. Verapamil 240 mg p.o. q.d.   DIET AT DISCHARGE:  Regular.  She tends to eat very moderately anyway.  However, if she notices increase in her abdominal pain, she can lighten up  on her diet.  If abdominal pain is persistent or severe and/or she  redevelops hematochezia, she is to call Dr. Nichola Sizer office.     Gerre Couch, P.A. [facility]                    Malcolm T. Fuller Plan, M.D. [facility]    SG/MEDQ  D:  02/15/2002  T:  02/15/2002  Job:  [id number]   cc:   Delfin Edis, M.D. [facility]. Sherrlyn Hock, M.D.  also George Hugh. Olevia Bowens, M.D.  [place], [place] [address]   Bernestine Amass, M.D.  [address] [place] [address]  Fax: [phone]    NAME:  JAZELYNN, FRONING NO.:  [id number]   MEDICAL RECORD NO.:  [id number]                   PATIENT TYPE:  INP   LOCATION:  5151                                 FACILITY:  [facility]   PHYSICIAN:  Norberto Sorenson T. Fuller Plan, M.D. [facility]          DATE OF BIRTH:  1928/11/26   DATE OF ADMISSION:  02/10/2002  DATE OF DISCHARGE:                                 DISCHARGE SUMMARY   ADDENDUM:  This is an addendum to previously dictated discharge summary,  [id number].  CT report:  CT scan of the abdomen and pelvis were performed on  December 5.  This showed marked transverse and descending colon colitis with  some free fluid in the pericolic gutters.  This was not felt to be  pseudomembranous colitis given the lack of involvement in the proximal  colon.  In the pelvic views, there was also marked transverse and descending  colitis along with sigmoid diverticulosis.  There was no evidence for active  diverticulitis.  There was a moderate to large amount of free pelvic fluid  present.  Incidentally, at the level of the L4-L5 spine, there was a disk  bulge with nitrogen gas formation.  Also, old granulomatous disease in the  liver and spleen seen and a calcified gallstone that was present within the  gallbladder without evidence for cholecystitis.  No mention is made in the  CT report of the appearance of the bladder.     Gerre Couch, P.A. [facility]                    Malcolm T. Fuller Plan, M.D. [facility]    SG/MEDQ  D:  02/15/2002  T:  02/15/2002  Job:  [id number]   cc:   Bernestine Amass, M.D.  [address]  [place]  [place] [address]  Fax: [place]  [address]  [place] [address]  Fax: [phone]

## 2010-04-11 NOTE — Miscellaneous (Signed)
Summary: med update and refill  Clinical Lists Changes  Medications: Changed medication from AVALIDE 300-25 MG TABS (IRBESARTAN-HYDROCHLOROTHIAZIDE) 1 tab once daily to AVAPRO 300 MG TABS (IRBESARTAN) 1 tab once daily - Signed Added new medication of HYDROCHLOROTHIAZIDE 25 MG TABS (HYDROCHLOROTHIAZIDE) 1 tab once daily - Signed Rx of AVAPRO 300 MG TABS (IRBESARTAN) 1 tab once daily;  #30 x 11;  Signed;  Entered by: Julaine Hua, CMA;  Authorized by: Renella Cunas, MD, [facility];  Method used: Telephoned to [facility] [address] [place], [place], [place], [place]  [address], Ph: [phone], Fax: [phone] Rx of HYDROCHLOROTHIAZIDE 25 MG TABS (HYDROCHLOROTHIAZIDE) 1 tab once daily;  #30 x 11;  Signed;  Entered by: Julaine Hua, CMA;  Authorized by: Renella Cunas, MD, [facility];  Method used: Telephoned to [facility] [address] [place], [place], [place], [place]  [address], Ph: [phone], Fax: [phone]    Prescriptions: HYDROCHLOROTHIAZIDE 25 MG TABS (HYDROCHLOROTHIAZIDE) 1 tab once daily  #30 x 11   Entered by:   Julaine Hua, CMA   Authorized by:   Renella Cunas, MD, [facility]   Signed by:   Julaine Hua, CMA on 04/04/2009   Method used:   Telephoned to ...       [facility]  [address] (retail)       [place], [place]  [address]       Ph: [phone]       Fax: [phone]   RxID:   [id number] AVAPRO 300 MG TABS (IRBESARTAN) 1 tab once daily  #30 x 11   Entered by:   Julaine Hua, CMA   Authorized by:   Renella Cunas, MD, [facility]   Signed by:   Julaine Hua, CMA on 04/04/2009   Method used:   Telephoned to ...       [facility]  [address] (retail)       [place], [place]  [address]       Ph: [phone]       Fax: [phone]   RxID:   [id number]

## 2010-04-11 NOTE — Procedures (Signed)
Summary: COLON   Colonoscopy  Procedure date:  01/04/2004  Findings:      Location:  [facility].   Patient Name: [name], [name] MRN: [id number] Procedure Procedures: Colonoscopy CPT: H7044205.    with biopsy. CPT: L3157292.  Personnel: Endoscopist: Pricilla Riffle. Fuller Plan, MD, Marval Regal.  Exam Location: Exam performed in Endoscopy Suite. [facility]  Patient Consent: Procedure, Alternatives, Risks and Benefits discussed, consent obtained, from patient.  Indications Symptoms: Hematochezia. Abdominal pain / bloating.  History  Current Medications: Patient is not currently taking Coumadin.  Pre-Exam Physical: Performed Jan 04, 2004. Cardio-pulmonary exam abnormal. Rectal exam WNL. HEENT exam  abnormal. Abdominal exam, Extremity exam, Neurological exam, Mental status exam WNL. Abnormal PE findings include: syst. murmur, LLQ tenderness.  Exam Exam: Extent of exam reached: Cecum, extent intended: Cecum.  The cecum was identified by appendiceal orifice and IC valve. Colon retroflexion performed. ASA Classification: II. Tolerance: excellent.  Monitoring: Pulse and BP monitoring, Oximetry used. Supplemental O2 given.  Colon Prep Used Miralax for colon prep. Prep results: good.  Sedation Meds: Patient assessed and found to be appropriate for moderate (conscious) sedation. Fentanyl 87.5 given IV. Versed 9 mg. given IV.  Comments: Tortuous sigmoid colon-moderately difficult exam Findings ISCHEMIC COLITIS: Transverse Colon to Descending Colon. suspected. Erosions present, Friability: mild. Activity level moderate, Endoscopic Extent of Disease: Segmental Colitis. superficial ulcers present. Biopsy/Mucosal Abn. taken. ICD9: Colitis, Ischemic: 557.9. Comments: 5-85mm nodular area in the middle of the ischemic segment with separate biopsies obtained.  NORMAL EXAM: Cecum to Transverse Colon.  DIVERTICULOSIS: Sigmoid Colon to Descending Colon. Not bleeding. ICD9: Diverticulosis: 562.10.    NORMAL EXAM: Rectum.   Assessment  Diagnoses: 557.9: Colitis, Ischemic.  562.10: Diverticulosis.   Events  Unplanned Interventions: No intervention was required.  Unplanned Events: There were no complications. Plans  Post Exam Instructions: No aspirin or non-steroidal containing medications: 2 weeks.  Medication Plan: Await pathology. Continue current medications.  Patient Education: Patient given standard instructions for: Colitis. Diverticulosis.  Disposition: After procedure patient sent to recovery. After recovery patient sent back to hospital.  Scheduling/Referral: Office Visit, to [place]. Olevia Perches, MD, around Feb 04, 2004.    This report was created from the original endoscopy report, which was reviewed and signed by the above listed endoscopist.    FINAL DIAGNOSIS    ***MICROSCOPIC EXAMINATION AND DIAGNOSIS***    1. COLON AT SPLENIC FLEXURE, BIOPSY: INFLAMED AND ULCERATED   COLONIC MUCOSA, SEE COMMENT    2. COLON, NODULAR AREA, BIOPSY: FRAGMENTS OF INFLAMMATORY   FIBRINOPURULENT EXUDATE ASSOCIATED WITH A MINUTE FRAGMENT OF   BENIGN COLONIC TISSUE, SEE COMMENT    COMMENT   1. The sections show fragments of benign colonic mucosa, one of   which displays active inflammation, mucosal attenuation and focal   ulceration. There are reactive/regenerative epithelial changes   present. Chronic changes are not appreciated. The features   favor ischemic colitis.    2. Much of the specimen shows fragments of inflammatory   fibrinopurulent exudate. One of these fragments is seen adjacent   to a small fragment of colonic tissue essentially composed of   ulcerated/necrotic mucosa, muscularis mucosa and submucosa. In   that fragment, the appearance is similar to that seen in the   first biopsy. The features favor ischemic colitis. While   pseudomembranous colitis is also in the differential diagnosis,   given the overall appearance it is not favored in this material.    (BNS:kcv 01-05-04)    kv   Date Reported:  01/05/2004 Violet Baldy, MD   *** Electronically Signed Out By BNS ***    Clinical information   Segmental colitis; ischemic vs. IBD (cm)    specimen(s) obtained   1: Colon, biopsy, splenic flexure   2: Colon, biopsy, nodular area    Gross Description   1. Received in formalin are tan, soft tissue fragments that are   submitted in toto. Number: 3   Size: .3 to .4 cm/1 cassette .    2. Received in formalin are four irregular fragments of soft,   reddish-tan material that vary from .2 to .3 cm in greatest   dimension. Submitted in toto in one cassette. (JBM:jy) 01/04/04    jy/

## 2010-04-11 NOTE — Assessment & Plan Note (Signed)
Summary: discuss colonoscopy/lk    History of Present Illness Visit Type: Initial Visit Primary GI MD: Delfin Edis MD Primary Provider: Redge Gainer, MD Chief Complaint: Discuss colonoscopy History of Present Illness:   [name] is a 77 year year old white female who comes today to discuss having a recall colonoscopy. We have seen her in the past for recurrent ischemic colitis requiring hospitalizations which was attributed to, most likely, her antihypertensive medications. She has labile hypertension, and her medications were adjusted to control her blood pressure, but she developed recurrent ischemic colitis. This was documented on a colonoscopy by Dr. Fuller Plan in October 2005. I previously did a colonoscopy on her in January 2002 which showed diverticulosis and a tubular adenoma at the hepatic flexure. Patient's other medical problems include pulmonary hypertension, atrial fibrillation, mitral regurgitation and a history of transient ischemic attacks. She has a family history of colon cancer in her brother. She underwent repair of her mitral valve and had an ablation at [facility] in 2007. She has been followed by Dr Verl Blalock.   GI Review of Systems      Denies abdominal pain, acid reflux, belching, bloating, chest pain, dysphagia with liquids, dysphagia with solids, heartburn, loss of appetite, nausea, vomiting, vomiting blood, weight loss, and  weight gain.      Reports diverticulosis and  hemorrhoids.     Denies anal fissure, black tarry stools, change in bowel habit, constipation, diarrhea, fecal incontinence, heme positive stool, irritable bowel syndrome, jaundice, light color stool, liver problems, rectal bleeding, and  rectal pain.    Current Medications (verified): 1)  Avapro 300 Mg Tabs (Irbesartan) .Marland Kitchen.. 1 Tab Once Daily 2)  Metoprolol Tartrate 50 Mg Tabs (Metoprolol Tartrate) .Marland Kitchen.. 1 Tab Two Times A Day 3)  Estrace 0.1 Mg/gm Crea (Estradiol) .... Use As Directed 4)   Vivelle-Dot 0.025 Mg/24hr Pttw (Estradiol) .... Change Patch 2 X Weekly 5)  Stool Softner .... As Needed 6)  Vitamin D3 1000 Unit Caps (Cholecalciferol) .... Once Daily 7)  Hydrochlorothiazide 25 Mg Tabs (Hydrochlorothiazide) .Marland Kitchen.. 1 Tab Once Daily 8)  Amoxicillin 500 Mg Caps (Amoxicillin) .... As Needed For Dental Work  Allergies (verified): 1)  ! Sulfa 2)  ! Codeine 3)  ! * Anacin  Past History:  Past Medical History: ATRIAL FIBRILLATION/POSTOPERATIVE (ICD-427.31) MITRAL REGURGITATION, SEVERE/.S/P MITRAL VALVE REPAIR (ICD-396.3) PERICARDITIS/POSTOPERATIVE (ICD-423.9) HYPERTENSION, UNSPECIFIED (ICD-401.9)  CVA Arthritis  Past Surgical History: Reviewed history from 09/26/2009 and no changes required. Anterior repair with perigee graft, posterior repair with apogee graft, Lynx mid urethral sling, sacrospinous ligament suspension and cystoscopy.  SURGEON:  Daleen Bo. Gaetano Net, M.D.Marland Kitchen. 04/16/2006 Anterior and posterior vaginal colporrhaphy... SURGEON:  Dr. Nori Riis.. 10/15/2004 Sacral spinous ligament suspension of vagina. Suprapubic cystotomy. Hysterectomy/BSO Mitral Valve Replacement  Family History: Reviewed history from 09/26/2009 and no changes required. Mother: deceased at 102 Father: deceased at 73..MI and lupus Siblings: 2 brothers Family History of Colon Cancer: Brother Family History of Heart Disease: Father & Mother  Social History: Reviewed history from 09/26/2009 and no changes required. Married  Alcohol Use - no Drug Use - no Retired  Patient has never smoked.   Review of Systems       The patient complains of arthritis/joint pain, fatigue, headaches-new, heart murmur, and shortness of breath.  The patient denies allergy/sinus, anemia, anxiety-new, back pain, blood in urine, breast changes/lumps, change in vision, confusion, cough, coughing up blood, depression-new, fainting, fever, hearing problems, heart rhythm changes, itching, menstrual pain, muscle pains/cramps,  night sweats,  nosebleeds, pregnancy symptoms, skin rash, sleeping problems, sore throat, swelling of feet/legs, swollen lymph glands, thirst - excessive , urination - excessive , urination changes/pain, urine leakage, vision changes, and voice change.         Pertinent positive and negative review of systems were noted in the above HPI. All other ROS was otherwise negative.   Vital Signs:  Patient profile:   77 year old female Height:      63 inches Weight:      118.50 pounds BMI:     21.07 Pulse rate:   64 / minute Pulse rhythm:   regular BP sitting:   120 / 68  (left arm) Cuff size:   regular  Vitals Entered By: [name] Deborra Medina) (October 02, 2009 9:22 AM)  Physical Exam  General:  Well developed, well nourished, no acute distress. Eyes:  PERRLA, no icterus. Mouth:  No deformity or lesions, dentition normal. Neck:  Supple; no masses or thyromegaly. Chest Wall:  postthoracotomy scar at right rib cage. Lungs:  Clear throughout to auscultation. Heart:  Regular rate and rhythm; no murmurs, rubs,  or bruits. Abdomen:  Soft, nontender and nondistended. No masses, hepatosplenomegaly or hernias noted. Normal bowel sounds. Rectal:  Normal exam. Stool is Hemoccult negative. Extremities:  No clubbing, cyanosis, edema or deformities noted. Skin:  Intact without significant lesions or rashes. Psych:  Alert and cooperative. Normal mood and affect.   Impression & Recommendations:  Problem # 1:  ISCHEMIC COLITIS, HX OF (ICD-V12.79) Patient has had several documented episodes of ischemic colitis, last one being in 2005. She is clinically stable on the estrogen patch. She has no symptoms suggestive of bowel ischemia.  Problem # 2:  Family Hx of COLON CANCER (ICD-153.9)  Patient has a family history of colon cancer in her brother. She is due for a recall colonoscopy at this time.  Orders: Colonoscopy (Colon)  Problem # 3:  COLONIC POLYPS, ADENOMATOUS, HX OF (ICD-V12.72)  Patient had  adenomatous polyps in 2002. We will schedule her for a colonoscopy at this time.  Orders: Colonoscopy (Colon)  Problem # 4:  TRANSIENT ISCHEMIC ATTACKS, HX OF (ICD-V12.50) Patient is on Avapro 300 mg which she needs to continue throughout colonoscopy.  Patient Instructions: 1)  High-fiber diet. 2)  Colonoscopy in September 2011 with MiraLax prep. 3)  Copy sent to : Dr Morrie Sheldon, Dr T.Wall 4)  The medication list was reviewed and reconciled.  All changed / newly prescribed medications were explained.  A complete medication list was provided to the patient / caregiver. Prescriptions: DULCOLAX 5 MG  TBEC (BISACODYL) Day before procedure take 2 at 3pm and 2 at 8pm.  #4 x 0   Entered by:   Madlyn Frankel CMA (AAMA)   Authorized by:   Lafayette Dragon MD   Signed by:   [place] ([place]) on 10/02/2009   Method used:   Electronically to        [facility] [phone]* (retail)       [place], [place]  [address]       Ph: [phone] or [phone]       Fax: [phone]   RxID:   [id number] REGLAN 10 MG  TABS (METOCLOPRAMIDE HCL) As per prep instructions.  #2 x 0   Entered by:   Madlyn Frankel CMA (AAMA)   Authorized by:   Lafayette Dragon  MD   Signed by:   Madlyn Frankel CMA (AAMA) on 10/02/2009   Method used:   Electronically to        [facility] [phone]* (retail)       [place], [place]  [address]       Ph: [phone] or [phone]       Fax: [phone]   RxID:   [id number] MIRALAX   POWD (POLYETHYLENE GLYCOL 3350) As per prep  instructions.  #255gm x 0   Entered by:   Madlyn Frankel CMA (AAMA)   Authorized by:   Lafayette Dragon MD   Signed by:   Madlyn Frankel CMA ([place]) on 10/02/2009   Method used:   Electronically to        [facility] [phone]* (retail)       [address] [place],   [address]        Ph: [phone] or [phone]       Fax: [phone]   RxID:   [id number]

## 2010-04-11 NOTE — Progress Notes (Signed)
Summary: speak to nurse   Phone Note Call from Patient Call back at Home Phone [phone]   Caller: Patient Call For: Dr Olevia Perches Reason for Call: Talk to Nurse Summary of Call: Patient wants to speak to nurse regarding proc she's having Thurs. Initial call taken by: Ronalee Red,  November 27, 2009 2:11 PM  Follow-up for Phone Call        Patient acidentally made her miralax/powerade mixture at about 1 pm today rather than tommorrow morning for her procedure on Thursday. She would like to know if the solution will be okay. I advised her that it should still be fine, although it may be thicker consistency than normal.  Follow-up by: Madlyn Frankel CMA Deborra Medina),  November 27, 2009 2:31 PM

## 2010-04-11 NOTE — Discharge Summary (Signed)
Summary: Acute Left Sided Abdominal Pain   NAME:  [name], GRESSLEY                  ACCOUNT NO.:  [id number]   MEDICAL RECORD NO.:  [id number]          PATIENT TYPE:  INP   LOCATION:  [id number]                         FACILITY:  [facility]   PHYSICIAN:  Pricilla Riffle. Fuller Plan, M.D. [facility] OF BIRTH:  28-Sep-1928   DATE OF ADMISSION:  01/01/2004  DATE OF DISCHARGE:  01/05/2004                                 DISCHARGE SUMMARY   ADMITTING DIAGNOSES:  1.  Acute left-sided abdominal pain, nausea, vomiting and rectal bleeding in      a patient with history of ischemic colitis x2, suspect recurrent episode      of ischemic colitis, rule out underlying mesenteric insufficiency versus      medication-induced episodes.  2.  History of hypertension.  3.  Status post total abdominal hysterectomy, bilateral salpingo-      oophorectomy and appendectomy.  4.  History of transient ischemic attack x1.  5.  History of diverticulosis and colon polyps.   DISCHARGE DIAGNOSES:  1.  Recurrent segmental ischemic colitis, symptomatically improving.  2.  History of hypertension.  3.  Status post total abdominal hysterectomy, bilateral salpingo-      oophorectomy and appendectomy.  4.  History of transient ischemic attack x1.  5.  History of diverticulosis and colon polyps.  6.  Possible left renal artery stenosis.   CONSULTATIONS:  None.   PROCEDURES:  1.  Colonoscopy per Dr. Norberto Sorenson T. Fuller Plan with biopsies.  2.  CT scan of the abdomen and pelvis.   BRIEF HISTORY:  [name] is a pleasant 77 year old white female with history of  ischemic segmental colitis for which she was admitted in June of 2005 and  had documented on CT scan.  She also had a similar episode in 2003.  Her  last colonoscopy was done in 2002 per Dr. Delfin Edis.  During her last  admission in June, her Premarin was discontinued, as was her verapamil, as  possible causative agent.  However, since that time, she had had significant  problems with hot flashes  and had discussed this with her gynecologist, Dr.  Viona Gilmore. Evette Cristal, and had gone back on the Premarin every other day, which had  controlled her hot flashes.  She was then started back on verapamil at 120  mg rather than 240 mg because of increase in hypertension.  Over the past  couple of weeks, she also had her benazepril dose increased from 20 mg daily  to 40 mg daily due to hypertension.  At this time, she presents with abrupt  onset of nausea, vomiting, abdominal pain and loose stools which eventually  became bloody.  She had multiple episodes into the night and during the day  yesterday, and had to wear a pad, which she was changing every half hour or  so, as it soaked through with blood.  She did not want to come to the  emergency room on the weekend and therefore called our office this morning  and was advised to come in for evaluation.  She was seen and evaluated, felt  to have probably a recurrent acute ischemic colitis and was admitted for  supportive management.   LABORATORY STUDIES ON ADMISSION:  WBC 14.6, hemoglobin 13, hematocrit of 39,  MCV of 92, platelets 241,000.  Followup on January 03, 2004 shows WBC of  9.1, hemoglobin 11.8, hematocrit of 34.3, platelets 206,000.  Pro time 12.9,  INR of 1, PTT of 30.  Antithrombin III level was normal at 91.  Factor IV  Leiden mutation was negative.  ANA was negative.  Protein C and S levels  were not done and may want to pursue as an outpatient.  Anticardiolipin IgM  was inconclusive at 12, anticardiolipin IgA was negative.  Potassium was 3.3  on admission; this corrected to 3.5 and albumin 3.2 on admission.  Liver  function studies normal.   X-RAY STUDIES:  Abdominal films on admission showed a mild diffuse ileus.   CT scan of the abdomen and pelvis on January 01, 2004 showed mucosal edema  of the distal transverse splenic flexure and descending colon consistent  with an acute colitis.  There was no vascular occlusion evident.  She  had an  incidental gallstone.  Origins of the celiac, SMA and renal arteries were  all patent, though it was felt she may have some stenosis at the origin of  the left renal artery.   HOSPITAL COURSE:  The patient was admitted to the service of Dr. Norberto Sorenson T.  Fuller Plan, who was covering the hospital.  She was initially placed on IV  fluids, clear liquid diet, started on antispasmodics and Demerol as needed  for pain control.  She was scheduled for a CT of the abdomen and pelvis with  CT angiogram because of the recurrent nature of her symptoms, CT scan with  findings as outlined above, consistent with a segmental colitis.  There was  no vascular occlusion noted.  We did send off markers for a hypercoagulable  state; these came back negative.  She did not have protein C and S levels  done, which should be done, once her acute symptoms have resolved.  It was  felt that possibly resuming Premarin had contributed to this in addition to  her antihypertensives.  We asked her to stay off of her Premarin and tried  her on a Climara patch, low dose at 0.025 weekly.  She was to follow up with  Dr. Nori Riis in his office in about 2 weeks and can discuss that further with  Dr. Nori Riis, though would favor leaving her off of Premarin.  We decreased her  dose of benazepril back down to 10 mg daily and left her on verapamil at 120  mg daily.  We encouraged liberal hydration as well and low-residue diet for  1 week.  She did undergo colonoscopy with Dr. Fuller Plan on the 27th, which did  confirm the findings of an ischemic-appearing colitis.  She did have 1  nodular area that was biopsied and biopsies were pending at the time of her  discharge, however, they show an area of inflammatory exudate, again most  consistent with an ischemic colitis per biopsies.  By January 05, 2004, she  was feeling better, able to tolerate solid foods, was no longer requiring narcotics and was allowed discharge to home with instructions to  follow up  with Dr. Olevia Perches in the office on January 25, 2004 at 10 a.m. and to call  for any problems in the interim.  She was to keep her followup with Dr. Nori Riis  as well.  MEDICATIONS:  1.  Darvocet-N 100 one every 4-6 hours as needed for pain.  2.  Climara patch 0.025 mg applied once weekly.  3.  Verapamil 240 mg take 1/2 tablet daily as previous.  4.  Benazepril 10 mg daily, which was a decrease in dose.  5.  No Premarin.   DIET:  Low residue for 1 week and then regular as tolerated, and push oral  fluids.      AE/MEDQ  D:  01/17/2004  T:  01/17/2004  Job:  [id number]   cc:   Maisie Fus, M.D.  [address] Cathie Beams  [place]  [place] [address]  Fax: [phone]   Chipper Herb, M.D.  [place]  [place] [address]  Fax: [phone]   Delfin Edis, M.D. [facility]

## 2010-04-11 NOTE — Discharge Summary (Signed)
Summary: Rectocele, Cystocele Repair  NAME:  [name], [name]                  ACCOUNT NO.:  [id number]   MEDICAL RECORD NO.:  [id number]          PATIENT TYPE:  INP   LOCATION:  9317                          FACILITY:  [facility]   PHYSICIAN:  Daleen Bo. Gaetano Net, M.D. DATE OF BIRTH:  02-06-1929   DATE OF ADMISSION:  04/16/2006  DATE OF DISCHARGE:  04/19/2006                               DISCHARGE SUMMARY   ADMITTING DIAGNOSES:  1. Cystocele.  2. Rectocele.  3. Stress urinary continence.   DISCHARGE DIAGNOSES:  1. Cystocele.  2. Rectocele.  3. Stress urinary continence.   PROCEDURE:  On April 16, 2006 anterior repair with Perigee graft,  posterior repair with Apogee graft, Lynx mid-urethral sling,  sacrospinous ligament suspension and cystoscopy.   REASON FOR ADMISSION:  The patient is a 77 year old married white female  G4, P4 status post hysterectomy, status post A&P repair in 2006 with  recurrent symptoms.  Details are dictated in history and physical.  She  is admitted for surgical management.   HOSPITAL COURSE:  The patient is admitted to the hospital and undergoes  the above procedure.  On the evening of surgery she is sore but getting  relief with pain medication.  Has not yet passed flatus.  Vital signs  are stable and she is afebrile.  Urine output is clear.  On the first  postoperative day she has not yet passed flatus.  Foley catheter has  just been removed and her first voiding attempt was unsuccessful.  She  is tolerating liquids.  Vital signs are stable and she is afebrile.  Blood pressures are 123456 systolic over [id number] diastolic range.  Her  metoprolol was held that morning.  She is continued on her Diovan.  Hemoglobin is 12.0.  On the second postoperative day she is ambulating  and passing flatus and tolerating regular diet.  However, she lives 1  hour from home, is quite fatigued and does not feel she is up to the  trip home.  Vital signs are stable and she is  afebrile.  She is observed  for one more night.  On the day of discharge her energy is better and  she is feeling good.  She is ambulating, voiding, passing flatus,  tolerating regular diet, and voiding without difficulty.  Vital signs  remain stable and she is afebrile.   CONDITION ON DISCHARGE:  Good.   DIET:  Regular as tolerated.   ACTIVITY:  No lifting, no operation of automobiles, no vaginal entry.  She is to call the office for problems including not limited to  temperature of 101 degrees, heavy vaginal bleeding, increasing pain, or  persistent nausea and vomiting.   MEDICATIONS:  1. Percocet 5/325 mg #30 one p.o. q.6 h. p.r.n.  2. Ibuprofen 600 mg q.6 h. p.r.n.  3. Colace and milk of magnesia as needed.  4. She will resume her previous medications.   FOLLOW UP:  Followup is in the office in 2 weeks.      Daleen Bo Gaetano Net, M.D.  Electronically Signed     JET/MEDQ  D:  04/19/2006  T:  04/19/2006  Job:  [id number]

## 2010-07-23 NOTE — Assessment & Plan Note (Signed)
[facility]                            CARDIOLOGY OFFICE NOTE   NYGERIA, CORNUTT                         MRN:          [id number]  DATE:07/26/2007                            DOB:          07/10/1928    HISTORY OF PRESENT ILLNESS:  [name] returns today.  She is doing  remarkably well and is totally asymptomatic.  She denies orthopnea, PND  or peripheral edema.  She has had no tachy palpitations.   PROBLEM LIST:  1. History of severe mitral regurgitation status post mitral valve      repair.  A 2-D echo May 2008 showed EF of 50%, mild global      hypokinesia, very mild mitral stenosis, valve area of 2.44.  no      significant mitral regurgitation, moderate dilated left atrium,      mildly increased pulmonary pressures, mild to moderate dilated      right atrium.  No pericardial effusion.  2. Postoperative pericarditis.  3. Postoperative atrial fib.  4. Hypertension.  Her blood pressure has been under great control.   CURRENT MEDICATIONS:  1. Metoprolol 50 b.i.d.  2. Estrace cream two times a week.  3. Vivelle Dot patch 0.025 mg.  4. Avalide 300/25 daily.  5. Simvastatin 20 mg q.h.s.  6. Vitamin D one a week.   She brings in blood work today which I have reviewed.  She has a history  normal coronary arteries and has total cholesterol at HDL ratio of less  than 5.   PHYSICAL EXAMINATION:  VITAL SIGNS:  Blood pressure today is 120/76,  pulse 60 and regular, weight is 131.  HEENT:  Unchanged.  NECK:  Shows no JVD.  Carotid upstrokes are equal bilaterally without  bruits, no thyromegaly.  Trachea is midline.  NECK:  Supple.  LUNGS:  Clear.  HEART:  Reveals a nondisplaced PMI.  She has no mitral regurgitation  murmur.  There is no gallop.  ABDOMEN:  Soft, good bowel sounds.  No midline bruits.  EXTREMITIES:  No cyanosis, clubbing or edema.  Pulses intact.  NEUROLOGICAL:  Exam is intact.   STUDIES:  EKG shows normal sinus rhythm with an  incomplete right bundle  with nonspecific T-wave changes.  They have actually improved in the  lateral leads.   Chest x-ray on June 30, 2006 was negative.   ASSESSMENT/PLAN:  Ms. Cusic is doing well.  I have made no change in her  medical program.  Will see her back again in a year.  At that time, she  needs a 2-D echocardiogram.     [name]. Sharon Blalock, MD, [facility]  Electronically Signed    TCW/MedQ  DD: 07/26/2007  DT: 07/26/2007  Job #: [id number]   cc:   Chipper Herb, M.D.  Maisie Fus, M.D.

## 2010-07-23 NOTE — Assessment & Plan Note (Signed)
[facility]                            CARDIOLOGY OFFICE NOTE   JULETTA, COSTELLA                         MRN:          [id number]  DATE:01/13/2007                            DOB:          1928/03/17    Ms. Hegger comes in today for further management of the following issues:  1. History of severe mitral regurgitation, status post mitral valve      repair.  Last 2D echocardiogram, May 2008, showed an ejection      fraction of 45% to 50%, mild global hyperkinesia, very mild mitral      stenosis of the valve area of 2.44, no significant mitral      regurgitation, moderately dilated left atrium, mildly increased      pulmonary artery systolic pressures, mild tricuspid regurgitation,      mild-to-moderate dilated right atrium and no pericardial effusion.  2. Postoperative pericarditis requiring recurrent long-term non-      steroidal anti-inflammatory agents.  It has now resolved, thank      goodness!  3. Postoperative atrial fibrillation, resolved.  4. Hypertension, which is now under much better control.   She had a bladder repair in February.  She is doing remarkably well.  She says she feels good with no symptoms of chest pain, shortness of  breath, tachy palpitations or peripheral edema.   She is on Avalide 300/25 daily, naproxen 250 mg b.i.d., metoprolol 50  b.i.d.   Her blood pressure today is 125/80, her pulse is 69 and regular, weight  is 129.  She looks remarkably good.  HEENT:  Normocephalic, atraumatic.  PERRLA, extraocular movements  intact.  Sclerae clear, facial symmetry is normal.  NECK:  Supple, carotid upstrokes are equal bilaterally without bruits.  No JVD.  Thyroid is not enlarged, trachea is midline.  LUNGS:  Clear.  HEART:  Reveals a nondisplaced PMI.  She has a normal S1, S2, without a  diastolic rumble.  There is no gallop.  ABDOMINAL EXAM:  Soft, good bowel sounds.  EXTREMITIES:  Reveal no edema.  Pulses are intact.  NEURO  EXAM:  Intact.   EKG shows sinus rhythm of the right bundle which is old.   I am delighted with how Ms. Ruhl is doing.  I have made no change in  her program.  We will plan on seeing her back in 6 months.    Thomas C. Verl Blalock, MD, [facility]  Electronically Signed   TCW/MedQ  DD: 01/13/2007  DT: 01/14/2007  Job #: [id number]   cc:   Genevie Ann, MD

## 2010-07-23 NOTE — Assessment & Plan Note (Signed)
[facility]                            CARDIOLOGY OFFICE NOTE   MAGELINE, OCH                         MRN:          [id number]  DATE:07/17/2006                            DOB:          11-12-1928    Ms. Beville returns today for further management of the following issues:  1. History of severe mitral regurgitation, status post mitral valve      repair.  2. Mild left ventricular systolic dysfunction.  3. Postoperative pericarditis that was recurrent requiring long-term      non-steroidal anti-inflammatory agents.  4. Postoperative atrial fibrillation, now resolved.  5. Hypertension, which was once poorly controlled.  6. Mild pulmonary hypertension.   She had her bladder repair in February.  She feels remarkably better.   She had to switch from Celebrex to naproxen 500 mg p.o. b.i.d. because  of her insurance.  She is on Diovan 320/hydrochlorothiazide 25 mg a day.  She also takes metoprolol 50 b.i.d.   She says she feels the best she has felt in some time with her heart.  She has an occasional sharp pain, but very rare.  She denies any  orthopnea, PND, or peripheral edema.  Her dyspnea on exertion has  improved.   Her insurance company now wants her to switch from Diovan  hydrochlorothiazide to Avalide.   Her blood pressure today was initially 184/84.  I rechecked it in the  left arm with a large cuff, and it was about 140/80.  Her pulse is 62  and regular.  HEENT:  Normocephalic, atraumatic.  PERRLA.  Extraocular movements  intact.  Sclerae clear.  Facial symmetry is normal.  NECK:  Supple.  Carotid upstrokes were equal bilaterally without bruits.  There is no JVD.  Thyroid is not enlarged.  LUNGS:  Clear.  HEART:  Reveals a regular rate and rhythm, no murmur, no rub.  ABDOMINAL EXAM:  Soft, good bowel sounds.  EXTREMITIES:  Reveal no edema.  Pulses are intact.   I had a long talk with Ms. Lewis today.  I think we can taper off of her  naproxen.  I have asked her to cut it to 1 tablet a day for the next  week to 10 days and discontinue it.  I have also changed her Diovan  hydrochlorothiazide to Avalide 300/25 daily for her insurance and to  save her some money.  I will plan on her seeing her back again in 6  months.   In the meantime, we will get a followup 2D echo to follow up on her  mitral valve and left ventricular function.  I suspect that her  pericarditis has resolved and hopefully will not return.     Thomas C. Verl Blalock, MD, [facility]  Electronically Signed    TCW/MedQ  DD: 07/17/2006  DT: 07/17/2006  Job #: [id number]   cc:   Genevie Ann, MD

## 2010-07-26 NOTE — Assessment & Plan Note (Signed)
[facility]                              CARDIOLOGY OFFICE NOTE   Sharon, Baker                         MRN:          [id number]  DATE:01/08/2006                            DOB:          1928-06-21    SUBJECTIVE:  [name] returns today for further management of her chronic  recurrent pericarditis.  She has done remarkably well on Celebrex 200 mg a  day.  She has had no recurrent pain.  Her shortness of breath is improved  and she has had no problems with lower extremity edema. Please see multiple  notes dating from mid summer to her last visit with details of this.   MEDICATIONS:  1. Metoprolol 50 mg b.i.d.  2. Estrace cream two times a week.  3. Vivelle-Dot patch 0.025 mg a day.  4. Diovan HCT 320/25 daily.  5. Celebrex 200 mg a day.   PHYSICAL EXAMINATION:  VITAL SIGNS:  His blood pressure is 134/81. Pulse is  61and regular.  Her weight is 120 up four pounds.  GENERAL:  She looks remarkably good.  SKIN:  Warm and dry.  She is not as pale as usual.  NECK:  There is no JVD.  Carotids upstrokes are equal bilaterally without  bruits.  LUNGS:  Clear.  HEART:  Regular rate and rhythm without gallop or rub.  ABDOMEN:  Soft.  EXTREMITIES:  No edema.  Pulses are present.   ASSESSMENT:  I am delighted that [name] is doing well.  I have cleared  her for bladder surgery with Dr. Dory Horn.  He and I discussed this by  phone.   I will plan on seeing her back in May 2008 which will be a year out from her  mitral valve repair.     Thomas C. Verl Blalock, MD, [facility]  Electronically Signed    TCW/MedQ  DD: 01/08/2006  DT: 01/08/2006  Job #: [id number]   cc:   Genevie Ann, M.D.

## 2010-07-26 NOTE — Discharge Summary (Signed)
[name], [name]                              ACCOUNT NO.:  [id number]   MEDICAL RECORD NO.:  [id number]                   PATIENT TYPE:  INP   LOCATION:  38                                 FACILITY:  [facility]   PHYSICIAN:  Docia Chuck. Henrene Pastor, M.D. [facility]             DATE OF BIRTH:  1928-03-17   DATE OF ADMISSION:  08/14/2003  DATE OF DISCHARGE:  08/18/2003                                 DISCHARGE SUMMARY   ADMITTING DIAGNOSES:  1. History of ischemic colitis, December 2003.  2. Acute colitis, suspect recurrent ischemic colitis.  3. History adenomatous colon polyps, hemorrhoids, and left sided     diverticulosis.  4. Hypertension.  5. Transient ischemic attack in the early 1990s.  6. History abnormal Pap smear for which she underwent total abdominal     hysterectomy and salpingo-oophorectomy.  Biopsy, pathology at that time     is not known.  7. Questionable status post appendectomy, possibly done at the time of     hysterectomy and not clarified by an abdominal pelvic computed tomography     scan of 2003.  8. Calcified gallstones seen on computed tomography 2003, incidental     finding.  9. Granulomatous changes in the liver and spleen, again incidental finding     seen on computed tomography, December 2003.  10.      Degenerative joint disease, especially involving the right foot.  11.      Heart murmur, specific type of valve abnormality not known.  12.      Allergy to codeine and sulfa.  She is not allergic to Tylenol or     hyoscyamine as has been listed in the chart at some point.  67.      History of scarlet fever, age 77.  36.      Osteopenia, question osteoporosis, currently being managed with     hormone replacement therapy.   DISCHARGE DIAGNOSES:  1. Recurrent ischemic colitis, resolving.  2. Heart murmur with echocardiogram this admission revealing left ventricle     ejection fraction of 55-65%, mild aortic valve regurgitation, and mild     mitral valve prolapse with mild  to moderate mitral valve regurgitation.  3. Carotid bruits.  Carotid Dopplers showing no stenosis but did show     tortuous vessels bilaterally.  4. Hypokalemia, resolved.  5. Hypertension, dosages of medications adjusted during this admission.  6. Osteopenia.  Have discontinued Premarin due to its risk for causing     ischemic colitis.  Have added calcium to her medical regimen.  7. No evidence for hyperlipidemia, see labs for results of the lipid     profile.  8. Degenerative disk disease involving the lumbar spine.  Finding discussed     with orthopedist, Dr. Eulas Post, who said that gas is a normal product of     breakdown of disk material and can be disregarded.  PROCEDURES:  None.   CONSULTATIONS:  Dr. Ethelle Lyon evaluated the patient for heart murmur  and hypertension.   BRIEF HISTORY:  [name] is a very pleasant 77 year old white female who  lives in [place].  She has had prior colonoscopy, as recently as January  2002, showing left sided diverticulosis, colon polyps, and hemorrhoids.  The  polyp was a tubular adenoma.  In December 2003, she had ischemic colitis.  She had been using Premarin at that time and it was briefly discontinued,  but because of osteopenia it had been restarted.  She also has hot flashes  and gets very anxious when she is not on her hormone replacement therapy.  The patient came to see Dr. Olevia Perches in the office, on June 6th, with  complaints of left sided abdominal pain and diarrhea that had become bloody.  These symptoms had begun the evening prior to her visit to the office.  By  the time she got to the office, her stools were pretty much purely blood of  a mild to moderate amount.  She was afebrile and not dizzy.  Dr. Olevia Perches  thought that she may very well have recurrent ischemic colitis and admitted  her to the hospital.  She had not been using any nonsteroidal's but had been  using low dose aspirin daily.  The patient does not have any  history of  upper GI pathology and does not require use of proton pump inhibitors or H2  blockers.   LABORATORIES:  White blood cell count 9.9, hemoglobin 13, hematocrit 37.7,  MCV 91.4, and platelets of 220,000.  Sed rate 7.  PT 13, INR 1, PTT 28.  Sodium 138, potassium 3.3 - corrected 3.9, glucose 96-102, BUN 9, creatinine  0.7.  Total bilirubin 0.7, alkaline phosphatase 46, AST 18, ALT 13, albumin  3.2.  Total cholesterol 187, triglycerides 144, HDL cholesterol 61, total  cholesterol/HDL ratio 3.1, VLDL cholesterol 29, and LDL cholesterol 97.  All  of these numbers are within normal limits.  TSH level 1.823.  Urinalysis was  negative except for a few bacteria but no leukocyte esterase, nitrites, or  RBC's, WBCs 0-2.   Echocardiogram, findings described above.   The EKG showed a normal sinus rhythm at 61 beats per minute, along with  incomplete right bundle branch block, and LVH with repolarization  abnormalities.   CT scan of the abdomen showed incidental cholelithiasis - uncomplicated,  calcifications in the spleen consistent with prior granulomatous disease and  incidental right renal cyst.  Pelvic views with diffuse thickening of  descending and sigmoid colon with associated inflammatory change in the  surrounding tissues and are consistent with colitis either from infectious  or ischemic etiology.   HOSPITAL COURSE:  1. Ischemic colitis.  The patient was admitted to the hospital and underwent     CT scan with contrast.  This demonstrated and confirmed colitis.  White     cell count was normal.  She was not treated with antibiotics because of     the lack of suspicion for this being infectious.  She was afebrile.  Diet     was initially limited to sips of clear liquids and ultimately advanced     over the course of her five day hospitalization to low residue diet, all    of which she tolerated.  Her bloody stools resolved.  By the time she was     discharged, she was having  small amounts of soft formed bowel  movement.     The pain subjectively and tenderness to exam et al. improved.  She was     stable for discharge and was to return home on hospital day five.     Because of suspicion that the Premarin which she uses can be associated     with ischemic colitis, this medication was discontinued.  A call was     placed into Dr. Nori Riis, her gynecologist, office and arrangements were to     be made by Dr. Nori Riis for the patient to follow up in his office following     discharge.  Apparently, she is taking the Premarin for osteopenia.  She     also expresses some symptomatology in terms of hot flashes and an     increase in nervous anxiety when she has been taken off the Premarin in     the past.  2. Heart murmur.  This heart murmur had apparently never been evaluated and     is a late systolic murmur.  Echocardiogram was obtained, and this showed     mitral valve prolapse with associated regurgitation as well as some mild     aortic valve regurgitation.  LV function was preserved.  Cardiology     assessed this and determined that she would from here on in require     antibiotic prophylaxis for dental and medical procedures.  3. History of hypertension.  Because of the new findings of valvular     regurgitation, Dr. Albertine Patricia felt that the patient would be best served by     controlling her blood pressure with ACE inhibitor; therefore, the ACE     inhibitor, benazepril, was continued and the dose was increased from 10     to 20 mg daily.  Verapamil was discontinued because she was having     periods where her blood pressure was in the 50s and it was not felt to be     as beneficial as an ACE inhibitor in her situation.  4. Hypokalemia.  The potassium was low when it was initially checked.     Potassium was added to her IV fluids and the potassium normalized.  5. Osteopenia.  As above, she had been being treated for this with Premarin     and again this medication was  discontinued.  We did add calcium with     vitamin D and magnesium to her medical regimen, and she should take 1500     mg of calcium daily.  She will followup with Dr. Nori Riis in the office to     discuss any other plans for treatment of her osteopenia.  Apparently, she     has tried Fosamax and Actonel in the past, but they have both caused her     to become hoarse, and they were discontinued.  She did not have any     esophageal pain or difficulty swallowing with these medications in the     past, however.  6. Carotid bruits.  A carotid Doppler study revealed no carotid stenosis,     but she did have tortuous vessels bilaterally.  7. Degenerative joint disease.  This is asymptomatic.  The patient does not     have significant low back pain in the lumbar region.  On the initial     verbal report from the CT scan there was some mention of some gas within    some herniated material in the lumbar disk area.  This was  not quoted on     the official dictated report; however, a call was placed to Dr. Eulas Post,     from orthopedics, and he said that this was not an uncommon finding in a     degenerating disk and was not anything to worry about, especially in an     asymptomatic patient.   CONDITION AT DISCHARGE:  Stable.   FOLLOWUP PLANS:  1. The patient does not need to see Dr. Olevia Perches in the office unless she is     having problems, and she should call her p.r.n.  She will be continued on     a every three to five year schedule for screening colonoscopies.  2. The patient was going to be calling Dr. Verlon Au office in order to obtain     and appointment to see him within the next 2-4 weeks.  3. She was advised to go up to [place] to have     her blood pressure checked within the next 3-4 weeks.   MEDICATIONS AT DISCHARGE:  1. She was to stop taking Verapamil and Premarin.  2. She was restart aspirin 81 mg daily on June 20th.  3. Calcium supplementation which contained  vitamin D and magnesium 1500 mg     daily.  She was advised that calcium citrate tends to cause less     constipation than calcium carbonate.  The Centrum Silver, depending on     how much calcium is contained within it may count for one of her doses of     calcium, but she should get a total of 1500 mg of calcium daily between     the calcium supplements and her multivitamin.  4. Lotensin 20 mg daily.  5. Tylenol Extra Strength as needed.  6. Hyoscyamine 0.375 mg one pill b.i.d. p.r.n. crampy abdominal pain.  A     prescription for six pills with one refill was provided as it was not     felt she would need this medication for very long, if at all.   DIET:  Low-fiber for the next two weeks and then regular foods.  She was  updated on current thinking as to treatment of diverticulosis with diet.  Current sentiment is that small seeds and nuts are okay to use, and she had  previously been following a seed and nut restricted diet but would be able  to add these back once she was taking a regular diet again.   SPECIAL INSTRUCTIONS:  She was advised to remember that if she has dental or  medical invasive procedures that she should request prophylactic antibiotics  from her physicians and dentist.      Azucena Freed, P.A. [facility]                   John N. Henrene Pastor, M.D. [facility]    SG/MEDQ  D:  08/18/2003  T:  08/19/2003  Job:  [id number]   cc:   Chipper Herb, M.D.  [place]  [place] [address]  Fax: [phone]   Delfin Edis, M.D. [facility]   Maisie Fus, M.D.  [address] [place]  [place] [address]  Fax: [phone]

## 2010-07-26 NOTE — Discharge Summary (Signed)
   NAMEINGA, AHLSTROM                              ACCOUNT NO.:  [id number]   MEDICAL RECORD NO.:  [id number]                   PATIENT TYPE:  INP   LOCATION:  5151                                 FACILITY:  [facility]   PHYSICIAN:  Norberto Sorenson T. Fuller Plan, M.D. [facility]          DATE OF BIRTH:  Apr 06, 1928   DATE OF ADMISSION:  02/10/2002  DATE OF DISCHARGE:                                 DISCHARGE SUMMARY   ADDENDUM:  This is an addendum to previously dictated discharge summary,  [id number].  CT report:  CT scan of the abdomen and pelvis were performed on  December 5.  This showed marked transverse and descending colon colitis with  some free fluid in the pericolic gutters.  This was not felt to be  pseudomembranous colitis given the lack of involvement in the proximal  colon.  In the pelvic views, there was also marked transverse and descending  colitis along with sigmoid diverticulosis.  There was no evidence for active  diverticulitis.  There was a moderate to large amount of free pelvic fluid  present.  Incidentally, at the level of the L4-L5 spine, there was a disk  bulge with nitrogen gas formation.  Also, old granulomatous disease in the  liver and spleen seen and a calcified gallstone that was present within the  gallbladder without evidence for cholecystitis.  No mention is made in the  CT report of the appearance of the bladder.     Gerre Couch, P.A. [facility]                    Malcolm T. Fuller Plan, M.D. [facility]    SG/MEDQ  D:  02/15/2002  T:  02/15/2002  Job:  [id number]   cc:   Bernestine Amass, M.D.  [address]  [place]  [place] [address]  Fax: [place]  [address]  [place] [address]  Fax: [phone]

## 2010-07-26 NOTE — H&P (Signed)
NAME:  [name], [name]                              ACCOUNT NO.:  [id number]   MEDICAL RECORD NO.:  [id number]                   Baker TYPE:  INP   LOCATION:  5151                                 FACILITY:  [facility]   PHYSICIAN:  Delfin Edis, M.D. [facility]               DATE OF BIRTH:  1929/02/27   DATE OF ADMISSION:  08/14/2003  DATE OF DISCHARGE:                                HISTORY & PHYSICAL   PRIMARY CARE PHYSICIANS:  Dr. Vicie Mutters and Dr. Redge Gainer at [facility].   GI PHYSICIAN:  Dr. Delfin Edis.   CHIEF COMPLAINT:  Acute abdominal pain, associated with diarrhea and bloody  stools and nausea and vomiting.   HISTORY OF PRESENT ILLNESS:  [name] is a very nice 77 year old white  female who has known diverticulosis of Sharon left colon, colon polyps at Sharon  hepatic flexure and hemorrhoids on colonoscopy in January 2002.  Sharon  pathology on Sharon polyps was tubular adenoma.  [name] had an episode of  colitis in December 2003, which was attributed to ischemia.  She had been  using Premarin as well as ibuprofen at that time.  These medications were  discontinued, but she was restarted on Sharon Premarin soon after because she  has very debilitating hot flashes and quite a lot of anxiety when she does  not use Sharon Premarin.  She has been doing well from a GI standpoint.  In  fact, she does not have to see her primary care doctor very often either.  She has controlled hypertension.   Yesterday at 6:00 p.m. she ate a dinner along with her family consisting of  mashed potatoes, roast beef and green beans.  Within 20 to 30 minutes, she  had acute abdominal pain, left sided, but radiating towards Sharon right side  in Sharon mid to lower abdomen associated with loose stools and nausea and  vomiting.  Sharon vomiting was mostly bilious and not coffee ground containing.  Sharon diarrhea was watery at first, but ultimately she began passing blood and  this morning and through Sharon  afternoon she has continued to pass blood per  rectum and Sharon pain is persistent, though she no longer is vomiting.  She  went to see Dr. Olevia Perches in Sharon office today and was heme positive on rectal  exam and quite tender to abdominal exam.  She was afebrile.  She was  admitted for management of recurrent colitis, probably ischemic again.  Sharon  Baker uses a low dose aspirin every day, but is not taking any  nonsteroidals.   PAST MEDICAL HISTORY:  1. History of ischemic colitis in December 2003.  2. History of adenomatous colon polyps, hemorrhoids and left sided     diverticulosis.  3. Hypertension.  4. Transient ischemic attack in Sharon early 1990s.  5. History of abnormal Pap smear, for which she underwent  a total abdominal     hysterectomy and salpingo-oophorectomy.  There is some question as to     whether she had cervical cancer at that time or not.  6. Question status post appendectomy.  This was not clarified by Sharon CT scan     in 2003 and it was possibly performed at Sharon time of her hysterectomy.  7. Calcified gallstones seen on CT in 2003, incidental finding.  8. Granulomatous changes in Sharon liver and spleen incidentally seen on CT of     December 2003.  9. Degenerative joint disease, especially involving Sharon right foot.  10.      Heart murmur, specific type of valve abnormality not known.  11.      Allergies to CODEINE and SULFA.  Other allergies listed in Sharon past     include ANACIN, but she is able to take aspirin.  There is also a prior     questionable history to TYLENOL, which caused nausea and vomiting, but     she now takes acetaminophen with impunity.  Therefore suspect that this     was a TYLENOL/NARCOTIC COMBINATION which she had trouble with in Sharon     past.  12.      History of gross hematuria in December 2003.  She did see Dr.     Risa Grill and apparently had a cystoscopy with no significant pathology     found.  This is per Baker report; do not have any details as  this was     all managed outpatient.  13.      History of mild normocytic anemia during December 2003 admission;     hemoglobin low of 10.6 at that time.  14.      History of hypokalemia during December 2003 admission.   CURRENT MEDICATIONS:  1. Verapamil 240 mg daily.  2. Lotensin 10 mg daily.  3. Aspirin 81 mg daily.  4. Centrum Silver.  5. Premarin 0.9 mg daily.  6. Tylenol Extra Strength as needed - usually takes it once to twice a week     at Sharon most.   FAMILY HISTORY:  One of her brothers has had colon cancer.  There is also a  history of lupus and coronary artery disease in her father.   SOCIAL HISTORY:  [name] lives in [place] with her husband.  She has  never smoked.  She does not consume alcoholic beverages.  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   REVIEW OF SYSTEMS:  PULMONARY:  No shortness of breath, no cough, no  pleuritic pain.  CARDIOVASCULAR:  No chest pain, no palpitations, no  presyncope or syncope.  NEUROLOGIC:  No dizziness, no headaches.  Sharon  Baker has never had any troubles with seizure or mini stroke type symptoms  since she had her TIA some years ago.  She has no history of seizures.  MUSCULOSKELETAL:  Does have frequent intermittent pain and stiffness in her  right foot.  This has unfortunately limited her ability to walk or get other  type of aerobic exercise.  HEMATOLOGIC:  [name] bruises easily, but  when she cuts herself Sharon blood clots readily.  DERMATOLOGIC:  No history of  skin cancer or suspicious lesions.  No rashes or pruritus.  GYN:  She does  take Sharon Premarin because if she does not she gets debilitating hot flashes.  PSYCHIATRIC:  [name] reports a significant amount of worry and anxiety  related to her grandson having recently enrolled in Sharon [employer].  She tends to feel that she is a bit of a nervous, high strung person to begin with,  but  manages to deal with it by working hard and staying active.  No history  of excessive emotional swings or tearfulness.  GENERAL:  [name]'s  appetite is generally good.  No trouble with irregular bowel habits or  bleeding per rectum.  No difficulty with anorexia.  Her weight is stable.   PHYSICAL EXAMINATION:  GENERAL:  [name] is a pleasant, petite, elderly  white female.  She looks generally to be in good health.  VITAL SIGNS:  Blood pressure 160/70; pulse 80; respirations 20; temperature  97.7; room air saturation is 97%.  HEENT:  Sclerae are nonicteric.  Conjunctivae are pink.  Extraocular  movements are intact.  Oropharynx; Sharon oral mucosa is moist and clear.  Dentition is in good repair.  NECK:  No JVD, no masses, no bruits and no thyromegaly.  CHEST:  Clear to auscultation and percussion bilaterally.  COR:  There is regular rate and rhythm with an audible 2 to 3/6 diastolic  murmur.  No rubs or gallops.  ABDOMEN:  Soft with hypoactive bowel sounds.  Tenderness is present  diffusely, but it is most significant in Sharon left lower quadrant.  There is  no guarding or rebound.  She is mildly distended, but abdomen is soft.  RECTAL:  Watery stool mixed with blood which is grossly and fecal occult  blood positive.  No masses.  NEUROLOGIC:  She is alert and oriented times three.  No tremors.  Grip  __________ strength 5/5 bilaterally.  PSYCHIATRIC:  In good spirits.  Does not appear depressed or overly anxious.  DERMATOLOGIC:  She has some broken blood vessels and some prominent veins in  Sharon lower extremities.  HEMATOLOGIC:  Some small petechiae on Sharon upper extremities.  EXTREMITIES:  No cyanosis, clubbing or edema.  Dorsalis pedis pulses 3+  bilaterally.   LABORATORIES:  Pending at present are CBC with differential, TSH, CMET, sed  rate, PT, PTT and urinalysis.  Also pending at this time is a CT scan of Sharon  abdomen and pelvis.   IMPRESSION:  1. Acute colitis - Suspect  recurrent ischemic colitis.  With lack of chronic     symptoms, doubt that this represents inflammatory bowel disease and doubt     that this represents infectious colitis as Sharon entire family ate Sharon same     meal that she had and did not get sick.  2. History of diverticulosis - Sharon history that she gives is not consistent     with diverticulitis.  However, CT scan should be able to rule this out     definitively.  3. History of hypertension - Controlled.  4. Heart murmur, for which she has never undergone any type of     echocardiographic evaluation.  5. Ongoing hormone replacement therapy for management of hot flashes and     anxiety.      Azucena Freed, P.A. [facility]                   Delfin Edis, M.D. [facility]    SG/MEDQ  D:  08/14/2003  T:  08/15/2003  Job:  [id number]

## 2010-07-26 NOTE — Op Note (Signed)
NAMEJANEVA, [name]                  ACCOUNT NO.:  [id number]   MEDICAL RECORD NO.:  [id number]          PATIENT TYPE:  OBV   LOCATION:  9312                          FACILITY:  [facility]   PHYSICIAN:  Maisie Fus, M.D.   DATE OF BIRTH:  05/20/1928   DATE OF PROCEDURE:  10/15/2004  DATE OF DISCHARGE:                                 OPERATIVE REPORT   PREOPERATIVE DIAGNOSES:  1.  Cystocele and prolapse.  2.  First degree rectocele.  3.  Vaginal vault descensus.   PAST OBSTETRICAL HISTORY:  1.  Cystocele and prolapse.  2.  First degree rectocele.  3.  Vaginal vault descensus.   OPERATIVE PROCEDURE:  1.  Anterior and posterior vaginal colporrhaphy.  2.  Sacral spinous ligament suspension of vagina.  3.  Suprapubic cystotomy.   SURGEON:  Dr. Nori Riis   ASSISTANT:  Dr. Damian Leavell INTRAOPERATIVE BLOOD LOSS:  350 mL.   INTRAOPERATIVE COMPLICATIONS:  None.   Details of the present illness are recorded in the admission note.  Patient  was admitted on the morning of surgery.  She received gentamicin and  ampicillin IV preoperatively as prophylaxis for heart murmur.  She was  placed in PAS hose.  She was brought to the operating room, there placed  under adequate general anesthesia, placed in dorsal lithotomy position.  Perineum and vagina were prepped in the usual fashion using Betadine  solution.  Sterile drapes were applied.  Posterior weighted vaginal  retractor was placed.  Mucosa anteriorly was grasped with Allis forceps and  a sharp incision made in the upper one-third of the vagina.  This dissection  was carried distally approximately 1 cm distal to the urethral meatus.  Bladder was carefully dissected off of the vaginal mucosa and the urogenital  fascia separated from mucosa.  Plication sutures of 0 Monocryl were then  used to elevate the urethrovesical angle to reduce the cystocele plicating  the urogenital fascia in midline.  Urethral length and patency was checked.  Length was at least 2.5 cm.  Segments of mucosa were excised.  The defect  anteriorly was closed with figure-of-eight of 0 Monocryl.  Attention was  turned posteriorly.  Fourchette was grasped on each side with Allis'.  Parametal shaped segment of skin was excised from the perineal body.  The  mucosa overlying the rectum was carefully separated from the rectal and  pararectal tissues.  The sacral spinous ligament was digitally explored and  identified.  Using the Capio Device and Vicryl suture suture was placed into  the mid portion of the ligament.  It was attached to the upper vaginal  mucosa posterior to the vaginal cuff.  Plication sutures of 0 Monocryl were  then used to recreate the rectovaginal shelf and elevate the levators.  The  mucosal defect was closed to the level of the vaginal introitus.  The sacral  spinous ligament stitch was tied and adequately suspended to the vagina.  The perineal defect was closed in a  fashion similar to episiotomy using the running 3-0 Vicryl suture.  Bladder  was  filled with irrigating solution.  Suprapubic Bonanno catheter was then  placed without difficulty and anchored to the skin with nylon sutures.  2-  inch iodoform gauze pack was placed.  Patient was awakened, taken to  recovery in good condition.       WRN/MEDQ  D:  10/15/2004  T:  10/15/2004  Job:  [id number]

## 2010-07-26 NOTE — Assessment & Plan Note (Signed)
[facility]                              CARDIOLOGY OFFICE NOTE   [name], [name]                         MRN:          [id number]  DATE:10/22/2005                            DOB:          1928-09-24    [name] returns today for further management of her pericarditic pain.   Her pain totally resolved on Advil 600 mg q.i.d.  After she took it for 10  days it has come back.  It is well localized in the precordium.  It hurts to  take a deep breath.  She denies any fever, chills or cough.   Her cough went away off of lisinopril.  She is on Diovan/hydrochlorothiazide  160/12.5.  Her blood pressure has been better.  Today it is 154/83 compared  to 160/85 last visit.   PHYSICAL EXAMINATION:  GENERAL APPEARANCE:  She is in no acute distress.  SKIN:  Warm and dry.  NECK:  Carotids are full.  There is no JVD.  LUNGS:  Clear.  No rub.  HEART:  Regular rate and rhythm without gallop or rub.  ABDOMEN:  Soft.  EXTREMITIES:  No edema.  Pulses are intact.   ASSESSMENT/PLAN:  [name] has recurrent pericarditic pain.  I have asked  her to restart her Advil 600 with meals and at bedtime for 10 more days.  Hopefully this will resolve this completely.  She will increase her  Diovan/hydrochlorothiazide to 320/25 for her blood pressure.  I will have  her follow up in about three weeks.                               Thomas C. Verl Blalock, MD, [facility]    TCW/MedQ  DD:  10/22/2005  DT:  10/22/2005  Job #:  [id number]   cc:   Genevie Ann, MD

## 2010-07-26 NOTE — Consult Note (Signed)
[name], [name] NO.:  [id number]   MEDICAL RECORD NO.:  [id number]                   PATIENT TYPE:  INP   LOCATION:  80                                 FACILITY:  [facility]   PHYSICIAN:  Ethelle Lyon, M.D. [facility]         DATE OF BIRTH:  May 14, 1928   DATE OF CONSULTATION:  08/16/2003  DATE OF DISCHARGE:                                   CONSULTATION   PHYSICIAN REQUESTING CONSULT:  Dr. Docia Chuck. Henrene Pastor.   PRIMARY CARE PHYSICIAN:  Dr. Chipper Herb.   REASON FOR CONSULTATION:  Heart murmur.   HISTORY OF PRESENT ILLNESS:  [name] is a 77 year old lady without prior  history of cardiac disease.  She was admitted on August 14, 2003 with an  episode of recurrent ischemic colitis.  She has been managed conservatively  and is now tolerating clear liquids.  She denies any history of chest  discomfort with exertion or at rest, exertional dyspnea, PND, orthopnea,  edema, claudication, palpitations, and syncope.  There is a history of  questionable TIA 10 years ago.   PAST MEDICAL HISTORY:  1. Hypertension.  2. Recurrent ischemic colitis.  3. Colonic polyps.  4. Diverticulosis.  5. History of distant TIA.  6. Cholelithiasis.  7. Scarlet fever at age 34.  55. Status post TAH/BSO.  9. Status post appendectomy.  10.      Degenerative disk disease.  11.      Degenerative joint disease.   ALLERGIES:  CODEINE and SULFA.   CURRENT MEDICATIONS:  Tylenol, Demerol, Phenergan.   MEDICATIONS PRIOR TO ADMISSION:  1. Verapamil 240 mg per day.  2. Lotensin 10 mg per day.  3. Aspirin 81 mg per day.  4. Multivitamin.  5. Premarin.   SOCIAL HISTORY:  The patient lives in [place] with her husband.  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  She has never  smoked and denies alcohol use.   FAMILY HISTORY:  Father died at 15 of myocardial infarction.  One brother  had colon cancer.   REVIEW OF SYSTEMS:  Remarkable for abdominal pain, bloody  diarrhea,  otherwise, negative in detail except as above.   PHYSICAL EXAMINATION:  GENERAL/VITAL SIGNS:  On physical exam, she is a  generally well-appearing thin woman in no distress with heart rate 62, blood  pressure 129/94, oxygen saturation of 97% on room air.  She is afebrile.  NECK:  She has no jugular venous distention and no thyromegaly or thyroid  nodules.  LUNGS:  Lungs are clear to auscultation.  CARDIAC:  She has a nondisplaced point of maximal cardiac impulse.  There is  a regular rate and rhythm.  There is a 3/6 murmur which begins in mid-  systole and is constant in intensity throughout systole.  It is loudest at  the apex but heard well across the precordium.  There is no  S3 and S4.  There is no diastolic murmur.  Carotid pulses are 2+ bilaterally without  bruit.  Femoral pulses are 2+ bilaterally.  Dorsalis pedis and PT pulses are  both 2+ bilaterally.  ABDOMEN:  The abdomen is soft with diffuse tenderness.  No hepatomegaly.  Unable to assess splenomegaly due to tenderness.  Bowel sounds are normal.  There is no rebound or guarding.  EXTREMITIES:  Extremities are warm without clubbing, cyanosis, edema, or  ulceration.   IMAGING STUDIES:  CT of the abdomen and pelvis demonstrated cholelithiasis  with clear lung bases, granulomatous disease in the spleen and changes  consistent with colitis.   LABORATORY STUDIES:  Laboratory studies remarkable for hematocrit of 38,  creatinine 0.8, ESR 7.   ELECTROCARDIOGRAM:  Electrocardiogram demonstrates normal sinus rhythm with  incomplete right bundle branch block, LVH with repolarization abnormalities.   ECHOCARDIOGRAM:  Echocardiogram dated August 15, 2003 demonstrates normal left  ventricular size and systolic function, EF of [id number] to 65%, left ventricular  wall thickness at the upper limits of normal, mild aortic insufficiency,  prolapse of the posterior leaflet of the mitral valve with mild-to-moderate  regurgitation.  Normal  estimated pulmonary pressures.   IMPRESSION/RECOMMENDATION:  [age]-year-old lady with mitral valve  prolapse with mild-to-moderate mitral regurgitation and mild aortic  insufficiency.  She should be treated with antibiotic prophylaxis for any  non-sterile invasive procedures including all dental work.  This was  stressed to the patient and her husband.  No further cardiac evaluation is  indicated.   Her history of recurrent ischemic colitis suggests atherosclerotic disease.  Aspirin is held due to bleeding.  Would check fasting lipid profile and add  statin with goal LDL of less than 70.                                               Ethelle Lyon, M.D. [facility]    WED/MEDQ  D:  08/16/2003  T:  08/17/2003  Job:  [id number]   cc:   Chipper Herb, M.D.  [place]  [place] [address]  Fax: [phone]   Delfin Edis, M.D. [facility]   Docia Chuck. Henrene Pastor, M.D. [facility]

## 2010-07-26 NOTE — H&P (Signed)
[name], GRIME NO.:  [id number]   MEDICAL RECORD NO.:  [id number]                   PATIENT TYPE:  INP   LOCATION:  5151                                 FACILITY:  [facility]   PHYSICIAN:  Gerre Couch, P.A. [facility]              DATE OF BIRTH:  [birth year]   DATE OF ADMISSION:  02/10/2002  DATE OF DISCHARGE:                                HISTORY & PHYSICAL   PRIMARY CARE PHYSICIAN:  De Nurse, M.D.   CHIEF COMPLAINT:  Hematochezia and left lower quadrant pain with nausea and  vomiting.   HISTORY OF PRESENT ILLNESS:  This is a generally healthy 77 year old white  female. She has a GI history of diverticulosis and hemorrhoids in the left  colon, as well as a polyp at the hepatic flexure when she had the most  recent colonoscopy in January 2002. Pathology was consistent with tubular  adenoma. No high grade dysplasia or malignancy. She also had diverticulosis  and hemorrhoids noticed on a 1996 colonoscopy. She has had a history of  remote TIA. She has treated hypertension. She does take a baby aspirin every  day. She has had some recent increase in moderate  ibuprofen use for right  foot pain and has no history of GI bleed.   At 7:30 p.m. yesterday. She developed nausea and vomiting and loose  stools.  She did not throw up any coffee ground material or blood. The vomiting  stopped by 10:30, although she still has been having  occasional queasiness  through today and it is now about 4:30 p.m. At about 9:30 p.m., she noted  that her stool was mixed with blood. At 5 a.m. she passed the first of five  or six purely bloody stools. The last of these occurred at about 1:30 p.m.  today. With this she has had left lower quadrant pain which does tend to  ease when she passes the bowel movements. She has not had fever or chills.  She has not dizziness nor light headedness. No chest pain or shortness of  breath.   She went  to see Dr. Sherrlyn Hock at [facility]. Labs were  obtained and she had a white count of 16.4000, hemoglobin adequate at 14.  She was afebrile and Dr. Sydell Axon Brodie's office was contacted and Dr. Sherrlyn Hock  was advised to send the patient to the emergency room at [facility].  2. ANACIN.  3. SULFA.  4. SHE SAYS THAT TYLENOL HAS CAUSED NAUSEA AND VOMITING IN THE PAST BUT     THERE IS A QUESTION MAYBE THAT THIS WAS A TYLENOL MIXED WITH A NARCOTIC.   MEDICATIONS:  1. Lotensin 10 mg p.o. q.d.  2. Hydrochlorothiazide 12.5 mg p.o. q.d.  3. Premarin 0.9 mg p.o. q.d.  4. Verapamil 240 mg p.o. q.d.  5. Aspirin 81  mg p.o. q.d.  6. Ibuprofen 200 mg p.r.n. She used 2 of these last night and has used     approximately a total of  5 over the last couple of weeks for control of     this foot pain in the right foot.   PAST MEDICAL HISTORY:  1. Colon polyps.  2. Colon diverticulosis.  3. Hypertension.  4. TIA in the early 1990s.  5. History of abnormal Pap smear, questionable cervical  cancer for which     she underwent total abdominal hysterectomy and salpingo-oophorectomy .  6. Question as to whether she had appendectomy  along with her pelvic     surgery.   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 She  lives in [place] with her husband. She has never been a smoker. She does not  consume alcoholic beverages.   FAMILY HISTORY:  Positive for colon cancer in a brother. Her father had  lupus and coronary artery disease.   REVIEW OF SYSTEMS:  PULMONARY:  No shortness of breath, no cough.  CARDIOVASCULAR:  No history of heart problems. No history of arrhythmias. No  current palpitations and no current chest pain. No pedal edema. GI:  As  above. She does not have much in the way of even intermittent chronic GERD  symptoms. She does not have dysphasia. She does say that she  ate salad at a  [employer] yesterday and had some fried onions on a hamburger while she was  there. She has noticed that she gets a little bit of upset intestine when  she has eaten large volumes of salad before. GENERAL: She has not had any  weight loss. Generally her appetite is very good. DERMATOLOGIC:  No history  of skin cancer, no rashes, no pruritus. HEMATOLOGIC:  No problems with  bleeding or easy bruising. ENT:  No rhinorrhea or sinus problems. No  epistaxis. ENDOCRINE: She does not have any sweating, urinary frequency or  polydipsia. GENITOURINARY:  The patient does say that when she urinated  today that she wiped herself and saw blood, but this was after she had  passed a bloody stool and the urine in the commode was mixed with stool so  she cannot say whether or not there was hematuria; it sounds more like she  just contaminated the tissue with blood that was left over from when she had  had rectal bleeding.   PHYSICAL EXAMINATION:  VITAL SIGNS:  Pulse 126, blood pressure 137/64,  temperature 98, respirations 18.  GENERAL:  The patient looks well. She is not in any distress and provides a  good history.  HEENT:  Sclerae anicteric. No conjunctival pallor. Extraocular movements  intact. Oropharynx, she has dental bridges. Mucous membranes are moist and  clear. No oral  lesions.  NECK:  No JVD, no goiter, no masses.  CHEST:  Clear to auscultation and percussion bilaterally. No cough with deep  inspiration.  COR:  There is regular rate and rhythm, no murmurs, rubs, gallops.  ABDOMEN:  Soft, nondistended with active bowel sounds. There is notable  tenderness in the left lower quadrant but no rebound or guarding associated  with this. No hepatosplenomegaly, no masses.  RECTAL:  On examination glove there is obvious fresh blood, no masses. There  are minor external hemorrhoids which are definitely not friable. EXTREMITIES:  No cyanosis, clubbing or edema. Dorsalis  pedis pulses 3+   bilaterally.  NEUROLOGIC:  No tremor. Grip strength and pedal strength is 5/5 bilaterally.  Speech is clear and lucid.  DERMATOLOGIC:  No rashes, no jaundice.  HEMATOLOGIC:  No bruises, no petechiae.  ENDOCRINE:  No sweating.   LABORATORY DATA:  Sodium 137, potassium slightly low at 3.0, BUN 16,  creatinine 0.9, glucose 114. Total bilirubin  0.8, alkaline phosphatase 52,  AST 22, ALT 16. Urinalysis is pending. Hemoglobin 14, hematocrit 41.5, WBC  count 16.4 thousand, platelets 307, MCV 93.1. Lipase 18. Pending at this  time is PT, INR and PTT.   ASSESSMENT:  1. Hematochezia with lower GI bleed and non bloody nausea and vomiting, rule     out diverticular bleed, but given the leukocytosis and left lower     quadrant pain and tenderness, question as to whether this is an ischemic     colitis; however, the bleeding seems quite excessive for an occurrence of     ischemic colitis. Other considerations are diverticulitis, but again the     amount of bleeding she is having  is excessive for diverticulitis.  2. Questionable Tylenol intolerance.  3. Right foot pain, for which she uses very moderate amounts of ibuprofen of     late. Possibly she could be very sensitive to the NSAIDS and these could     have caused some colonic ulcerations.  4. Mild hypokalemia.   PLAN:  She is to be admitted. She is to be continued on IV fluids. We will  obtain serial CBCs. Question as to whether we should start antibiotics; for  the time being I am going to hold off on these.  Plan to get a CT scan of the abdomen and pelvis. Supplement potassium in IV  fluids and give a small dose of oral potassium. Allow the patient to  continue on a diet of clear liquids.                                               Gerre Couch, P.A. [facility]    SG/MEDQ  D:  02/10/2002  T:  02/10/2002  Job:  [id number]

## 2010-07-26 NOTE — H&P (Signed)
[name], [name]                  ACCOUNT NO.:  [id number]   MEDICAL RECORD NO.:  [id number]          PATIENT TYPE:  INP   LOCATION:  [id number]                         [facility]AC[facility]:  [facility]   PHYSICIAN:  [name], M.D. [facility] OF BIRTH:  [birth year]   DATE OF ADMISSION:  01/01/2004  DATE OF DISCHARGE:                                HISTORY & PHYSICAL   CHIEF COMPLAINT:  Abdominal pain and rectal bleeding onset December 30, 2003.   HISTORY:  [name] is a very nice 77 year old white female with a history of  ischemic segmental colitis for which she was admitted in June of 2005 and  had documented on CT scan.  She also had a similar episode in 2003. Her last  colonoscopy was done in January of 2002. At that time, she had one small  polyp and descending colon diverticulosis.   This last admission in June, her Premarin was stopped and her verapamil was  stopped as well.  However since then she had significant problems with hot  flashes, discussed this with her gynecologist, Dr. Nori Riis and went back on the  Premarin every other day which has controlled her hot flashes. She also was  started back on verapamil at 120 mg rather than 240 mg per day because of  hypertension. Within the past couple of weeks, she also required having her  benazepril dose increased from 20 mg to 40 mg secondary to hypertension. She  has now had abrupt onset on Saturday evening, October 22, at about midnight  with severe abdominal crampy pain associated with diaphoresis, nausea,  vomiting and then loose stools which eventually became bloody. She had  multiple episodes that night into yesterday and had to wear a pad which she  says she was soaking with blood every half an hour or so. By the morning of  admission, her bleeding had decreased. She called the office and was advised  to come to the emergency room for evaluation. She says she did not want to  come over the weekend because she did not want to have to wait for  several  hours in the ER.   CURRENT MEDICATIONS:  1.  Premarin 0.625 every other day.  2.  Verapamil 120 mg daily.  3.  Benazepril recently increased from 20 mg to 40 mg daily.  4.  Hyoscyamine p.r.n.   ALLERGIES:  CODEINE and SULFA.   PAST MEDICAL HISTORY:  She is status post total abdominal hysterectomy, BSO,  history of hypertension, degenerative joint disease. She is status post  appendectomy and has history of colon polyps, history of TIA in the 58's and  history of scarlet fever age 38 with history of mitral valve disease.   FAMILY HISTORY:  One brother with history of colon cancer, father with lupus  and coronary artery disease.   SOCIAL HISTORY:  The patient is retired, lives with her husband. She is a  nonsmoker, nondrinker.   REVIEW OF SYMPTOMS:  CARDIOVASCULAR:  Denies any chest pain or anginal  symptoms.  She says she does require antibiotics for procedures. PULMONARY:  Negative  for cough, shortness of breath or sputum production. GENITOURINARY:  Denies any dysuria, urgency or frequency. GI:  As above.   PHYSICAL EXAMINATION:  GENERAL:  Well-developed, white female in no acute  distress. She is alert and oriented.  VITAL SIGNS:  Temperature is 98.8, blood pressure 112/76, pulse is 90,  respirations 20.  HEENT:  Normocephalic, atraumatic.  EOMI, PERRLA. Sclera anicteric.  NECK:  Supple without nodes.  CARDIOVASCULAR:  Regular rate and rhythm with S1 and S2. She has a soft  murmur.  PULMONARY:  Clear.  ABDOMEN:  Soft. Bowel sounds are active. She is tender in the left upper  quadrant, left mid quadrant and left lower quadrant as well as the  suprapubic region. There is slight distention, no rebound and no guarding.  No mass or hepatosplenomegaly.  RECTAL:  Mucousy red blood on the examining glove.  EXTREMITIES:  Without cyanosis, clubbing or edema.   LABORATORY DATA:  WBC on admission 14.6, hemoglobin 13, hematocrit 39, MCV  92.  Pro time 12.9, INR 1, potassium  3.3, BUN 12, creatinine 1, liver  function studies normal.   IMPRESSION:  A 77 year old white female with recurrent acute left abdominal  pain, nausea, vomiting and rectal bleeding with history of ischemic colitis  x2 in the past. Suspect this is a recurrent episode of ischemic colitis.  Rule out underlying mesenteric insufficiency, rule out medication induced  with estrogens and antihypertensives.  1.  History of hypertension.  2.  Status post TAH/BSO and appendectomy.  3.  History of TIA x1.  4.  History of diverticulosis and colon polyps.   Baker:  The patient is admitted to the service of [name], M.D. for  IV fluid hydration, bowel rest, pain control. Will hold her Premarin and her  antihypertensives in the shortterm. Will Baker CT abdomen with CT angiogram  this admission to evaluate her large vessels due to the recurrent nature of  her problem. She also may need to be worked up for underlying anticoagulable  state or underlying collagen vascular disease.      AE/MEDQ  D:  01/03/2004  T:  01/03/2004  Job:  [id number]   cc:   Chipper Herb, M.D.  [place]  [place] [address]  Fax: [phone]   W. Evette Cristal, M.D.  [address] [place]  [place] [address]  Fax: [phone]

## 2010-07-26 NOTE — Cardiovascular Report (Signed)
NAME:  [name], [name] NO.:  [id number]   MEDICAL RECORD NO.:  [id number]          PATIENT TYPE:  INP   LOCATION:  [id number]                         FACILITY:  [facility]   PHYSICIAN:  Eustace Quail, M.D. [facility] DATE OF BIRTH:  [birth year]   DATE OF PROCEDURE:  05/08/2005  DATE OF DISCHARGE:                              CARDIAC CATHETERIZATION   RIGHT AND LEFT HEART CATHETERIZATION REPORT:   CLINICAL HISTORY:  [name] is 77 years old and has mitral regurgitation  and has symptoms of dyspnea on exertion. She was recently seen by Dr. Verl Blalock  and she had an echocardiogram which suggested a pulmonary artery pressure of  80 systolic and severe mitral regurgitation with mitral valve prolapse. She  was scheduled for evaluation with angiography today.   PROCEDURE:  Right heart catheterization was performed percutaneously via the  right femoral artery using a medium sheath and Swan-Ganz thermodilution  catheter. A left heart catheterization was performed percutaneously via left  femoral artery using an arterial sheath and #6 French preperformed coronary  catheters. The right femoral artery was closed at the end of the procedure.  The patient tolerated the procedure well and left the laboratory in  satisfactory condition.   RESULTS:  1.  The left main coronary artery was free of significant disease.  2.  The left anterior descending artery gave rise to 3 separate perforators      and 2 diagonal branches. These were probably free of significant      disease.  3.  The circumflex marginal gave rise to a marginal branch and a      posterolateral branch. These vessels were free of significant disease.  4.  The right coronary artery gave rise to a conus branch, a right      ventricular branch, a posterior descending branch and a large      posterolateral branch. These vessels were free of significant disease.   The left ventriculogram was performed in the RAO projection showed good wall  motion with no evidence of hypokinesis. The estimated ejection fraction was  60%. There was a moderately severe 3+ mitral regurgitation, although this  was difficult to assess due to ectopy from the ventriculogram.   HEMODYNAMIC DATA:  The right aortic pressure was 5 mean. The pulmonary  artery pressure was 66/19, with a mean of 38. Pulmonary wedge pressure was  17 with V waves of 28 and this increased to a mean of 18 and V waves to 35  later in the procedure. Left ventricular pressure was 139/15. The aortic  pressure was 139/73. Cardiac output/cardiac index was 3.0/2.0  liters/minute/meter squared by Fick.   CONCLUSION:  1.  Moderately severe to severe mitral regurgitation with an elevated      pulmonary artery wedge pressure of 17 and V waves of 18 to 35.  2.  Normal coronary angiography and left ventricular function.   RECOMMENDATIONS:  The patient has severe mitral regurgitation by echo, and  at least moderately severe mitral regurgitation by angiography, with  elevation of pulmonary wedge and pulmonary artery pressures. She was  also  symptomatic with dyspnea on exertion. We will plan further evaluation with a  transesophageal echocardiogram tomorrow to further assess the severity of  her mitral regurgitation and the feasibility for repair. I believe that she  will probably require mitral valve repair.           ______________________________  Eustace Quail, M.D. [facility]     BB/MEDQ  D:  05/08/2005  T:  05/09/2005  Job:  [id number]   cc:   Genevie Ann, M.D.  [place] C. Wall, M.D.  [address] [place]  [place] [address]

## 2010-07-26 NOTE — Assessment & Plan Note (Signed)
[facility]                              CARDIOLOGY OFFICE NOTE   [name], [name]                         MRN:          [id number]  DATE:10/07/2005                            DOB:          03/22/1928    Ms. Burstein comes in today because of some sharp stabbing pain when she takes  a deep breath in the anterior chest.  This started about three or four days  ago. She has had some night sweats.  She has had a dry cough but no colored  sputum.  She denies any hemoptysis.   She denies any orthopnea, PND or peripheral edema.  She does have some  dyspnea on exertion.   We repeated a 2D echocardiogram today and preliminary report shows minimal  posterior pericardial effusion. Her LV function has decreased since her  mitral valve repair to about EF of about 40%.  LV chamber size is normal  now.  Her mitral valve repair is intact with a main transmitral gradient of  4 mmHg.  She has no mitral regurgitation.  She has bi-atrial enlargement  which is mild.  Her main pulmonary pressure has dropped by half to the mid  40s from 80 before.  Her cath pulmonary pressure was 66/19 with a mean of  38.  There was minimal tricuspid regurgitation.   Her blood pressure has been running high at home.   Her medications are:  1.  Benazepril 10 mg a day.  2.  Metoprolol 25 mg b.i.d.  (We started that last visit to decrease her      heart rate which was in the 90s).  3.  Aspirin 325 a day.  4.  Stool softener.  5.  Vivelle-Dot and Estrace.   PHYSICAL EXAMINATION:  VITAL SIGNS:  Her blood pressure is 170/85, pulse is  73 and regular, weight is 115.  GENERAL:  She is in no acute distress except if she  takes a deep breath,  she winces.  NECK:  No JVD, carotids are full without bruits.  LUNGS:  Good breath sounds with no rub.  Her lateral incision is nicely  healed.  CARDIAC:  Precordial exam shows a normal S1 and S2 without murmur.  There is  no rub heard in multiple  auscultation positions.  ABDOMEN:  Soft, good bowel sounds.  EXTREMITIES:  No edema.  Pulses are intact.   ASSESSMENT:  1.  Pericarditic pain with a very minimal pericardial effusion on 2D echo.  2.  Mild left ventricular systolic dysfunction, ejection fraction decreased      after mitral valve repair.  3.  Stable mitral valve repair without any  mitral regurgitation.  4.  Postoperative atrial fibrillation, now resolved.  5.  Hypertension, poorly controlled.  6.  Mild pulmonary hypertension, significantly decreased since mitral valve      repair.   PLAN:  1.  Discontinue benazepril because of dry cough.  Will substitute Diovan      HCTZ 160/12.5 q.a.m.  2.  Discontinue her aspirin.  3.  Increase metoprolol to 50 b.i.d.  4.  Advil 600 mg q.i.d. with meals and at bedtime for ten days.   I will schedule her for followup in two weeks.  Will check her blood  pressure at that time as well as some blood work.  Hopefully this  pericarditic pain wears off.                               Thomas C. Verl Blalock, MD, [facility]    TCW/MedQ  DD:  10/07/2005  DT:  10/07/2005  Job #:  [id number]   cc:   Genevie Ann, M.D.  Dr. Amador Cunas

## 2010-07-26 NOTE — Discharge Summary (Signed)
NAMESELINDA, [name]                  ACCOUNT NO.:  [id number]   MEDICAL RECORD NO.:  [id number]          PATIENT TYPE:  INP   LOCATION:  [id number]                         FACILITY:  [facility]   PHYSICIAN:  Norberto Sorenson T. Fuller Plan, M.D. [facility] OF BIRTH:  04-01-1928   DATE OF ADMISSION:  01/01/2004  DATE OF DISCHARGE:  01/05/2004                                 DISCHARGE SUMMARY   ADMITTING DIAGNOSES:  1.  Acute left-sided abdominal pain, nausea, vomiting and rectal bleeding in      a patient with history of ischemic colitis x2, suspect recurrent episode      of ischemic colitis, rule out underlying mesenteric insufficiency versus      medication-induced episodes.  2.  History of hypertension.  3.  Status post total abdominal hysterectomy, bilateral salpingo-      oophorectomy and appendectomy.  4.  History of transient ischemic attack x1.  5.  History of diverticulosis and colon polyps.   DISCHARGE DIAGNOSES:  1.  Recurrent segmental ischemic colitis, symptomatically improving.  2.  History of hypertension.  3.  Status post total abdominal hysterectomy, bilateral salpingo-      oophorectomy and appendectomy.  4.  History of transient ischemic attack x1.  5.  History of diverticulosis and colon polyps.  6.  Possible left renal artery stenosis.   CONSULTATIONS:  None.   PROCEDURES:  1.  Colonoscopy per Dr. Norberto Sorenson T. Fuller Plan with biopsies.  2.  CT scan of the abdomen and pelvis.   BRIEF HISTORY:  [name] is a pleasant 77 year old white female with history of  ischemic segmental colitis for which she was admitted in June of 77 and  had documented on CT scan.  She also had a similar episode in 2003.  Her  last colonoscopy was done in 2002 per Dr. Delfin Edis.  During her last  admission in June, her Premarin was discontinued, as was her verapamil, as  possible causative agent.  However, since that time, she had had significant  problems with hot flashes and had discussed this with her gynecologist,  Dr.  Viona Gilmore. Evette Cristal, and had gone back on the Premarin every other day, which had  controlled her hot flashes.  She was then started back on verapamil at 120  mg rather than 240 mg because of increase in hypertension.  Over the past  couple of weeks, she also had her benazepril dose increased from 20 mg daily  to 40 mg daily due to hypertension.  At this time, she presents with abrupt  onset of nausea, vomiting, abdominal pain and loose stools which eventually  became bloody.  She had multiple episodes into the night and during the day  yesterday, and had to wear a pad, which she was changing every half hour or  so, as it soaked through with blood.  She did not want to come to the  emergency room on the weekend and therefore called our office this morning  and was advised to come in for evaluation.  She was seen and evaluated, felt  to have probably a recurrent acute ischemic  colitis and was admitted for  supportive management.   LABORATORY STUDIES ON ADMISSION:  WBC 14.6, hemoglobin 13, hematocrit of 39,  MCV of 92, platelets 241,000.  Followup on January 03, 2004 shows WBC of  9.1, hemoglobin 11.8, hematocrit of 34.3, platelets 206,000.  Pro time 12.9,  INR of 1, PTT of 30.  Antithrombin III level was normal at 91.  Factor IV  Leiden mutation was negative.  ANA was negative.  Protein C and S levels  were not done and may want to pursue as an outpatient.  Anticardiolipin IgM  was inconclusive at 12, anticardiolipin IgA was negative.  Potassium was 3.3  on admission; this corrected to 3.5 and albumin 3.2 on admission.  Liver  function studies normal.   X-RAY STUDIES:  Abdominal films on admission showed a mild diffuse ileus.   CT scan of the abdomen and pelvis on January 01, 2004 showed mucosal edema  of the distal transverse splenic flexure and descending colon consistent  with an acute colitis.  There was no vascular occlusion evident.  She had an  incidental gallstone.  Origins of  the celiac, SMA and renal arteries were  all patent, though it was felt she may have some stenosis at the origin of  the left renal artery.   HOSPITAL COURSE:  The patient was admitted to the service of Dr. Norberto Sorenson T.  Fuller Plan, who was covering the hospital.  She was initially placed on IV  fluids, clear liquid diet, started on antispasmodics and Demerol as needed  for pain control.  She was scheduled for a CT of the abdomen and pelvis with  CT angiogram because of the recurrent nature of her symptoms, CT scan with  findings as outlined above, consistent with a segmental colitis.  There was  no vascular occlusion noted.  We did send off markers for a hypercoagulable  state; these came back negative.  She did not have protein C and S levels  done, which should be done, once her acute symptoms have resolved.  It was  felt that possibly resuming Premarin had contributed to this in addition to  her antihypertensives.  We asked her to stay off of her Premarin and tried  her on a Climara patch, low dose at 0.025 weekly.  She was to follow up with  Dr. Nori Riis in his office in about 2 weeks and can discuss that further with  Dr. Nori Riis, though would favor leaving her off of Premarin.  We decreased her  dose of benazepril back down to 10 mg daily and left her on verapamil at 120  mg daily.  We encouraged liberal hydration as well and low-residue diet for  1 week.  She did undergo colonoscopy with Dr. Fuller Plan on the 27th, which did  confirm the findings of an ischemic-appearing colitis.  She did have 1  nodular area that was biopsied and biopsies were pending at the time of her  discharge, however, they show an area of inflammatory exudate, again most  consistent with an ischemic colitis per biopsies.  By January 05, 2004, she  was feeling better, able to tolerate solid foods, was no longer requiring narcotics and was allowed discharge to home with instructions to follow up  with Dr. Olevia Perches in the office on  January 25, 2004 at 10 a.m. and to call  for any problems in the interim.  She was to keep her followup with Dr. Nori Riis  as well.   MEDICATIONS:  1.  Darvocet-N  100 one every 4-6 hours as needed for pain.  2.  Climara patch 0.025 mg applied once weekly.  3.  Verapamil 240 mg take 1/2 tablet daily as previous.  4.  Benazepril 10 mg daily, which was a decrease in dose.  5.  No Premarin.   DIET:  Low residue for 1 week and then regular as tolerated, and push oral  fluids.      AE/MEDQ  D:  01/17/2004  T:  01/17/2004  Job:  [id number]   cc:   Maisie Fus, M.D.  [address] Cathie Beams  [place]  [place] [address]  Fax: [phone]   Chipper Herb, M.D.  [place]  [place] [address]  Fax: [phone]   Delfin Edis, M.D. [facility]

## 2010-07-26 NOTE — Consult Note (Signed)
[name], [name] NO.:  [id number]   MEDICAL RECORD NO.:  [id number]          PATIENT TYPE:  EMS   LOCATION:  MAJO                         FACILITY:  [facility]   PHYSICIAN:  Titus Mould, MD     DATE OF BIRTH:  Apr 25, 1928   DATE OF CONSULTATION:  06/30/2006  DATE OF DISCHARGE:                                 CONSULTATION   CHIEF COMPLAINT:  Right shoulder pain.   HISTORY OF PRESENT ILLNESS:  This is a very pleasant 77 year old female  who last night had the onset of right upper arm, right shoulder  discomfort, along with some discomfort in the back of her neck.  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  She has had no  loss of range of motion, no motor or sensation changes in her right  upper extremity.  She has taken some Tylenol without significant relief.  She is unable to describe the exact type of discomfort this is, but  states that it is 6/10.  She states that she has had no left-sided chest  discomfort, no PND or orthopnea, no lower extremity swelling, no syncope  or presyncope, no palpitations, no worsening dyspnea on exertion.   PAST MEDICAL HISTORY:  1. History of mitral valve repair/annuloplasty ring in 2007.  2. Postoperative atrial fibrillation.  3. Cardiac catheterization 2007 with normal coronary arteries.  4. Hypertension.  5. Osteopenia.  6. History of pulmonary hypertension.  7. Bladder surgery.  8. Postoperative pericarditis.  9. Anterior-posterior vaginal repair.   MEDICATIONS:  1. Diovan HCT 325/25 1 tablet p.o. daily.  2. Naproxen 600 mg p.o. b.i.d.  3. No Lopressor 50 mg p.o. b.i.d.   ALLERGIES:  1. SULFA.  2. CODEINE.   SOCIAL HISTORY:  The patient denies tobacco use.   FAMILY HISTORY:  Noncontributory.   REVIEW OF SYSTEMS:  All systems reviewed in detail are negative except  as noted in the history of present illness.   PHYSICAL EXAMINATION:  VITAL SIGNS:  Temperature 98.0, heart rate 57,  respiratory rate 20, oxygen saturation 100%.  Blood pressure 154/82.  GENERAL:  Well-developed, well-nourished elderly female, alert and  oriented x3, no apparent distress.  HEENT:  Atraumatic, normocephalic, pupils equal, round and react to  light.  Extraocular movements.  NECK:  Supple with no adenopathy.  No JVD.  CHEST:  Lungs clear to auscultation bilaterally.  CORONARY:  Regular rhythm, normal rate, normal S1 and S2 with no  murmurs.  ABDOMEN:  Soft, nontender, nondistended.  EXTREMITIES:  No clubbing, cyanosis or edema.  No pinpoint pain in her  right upper extremity, normal range of motion, normal sensation, 2+  right radial pulse.   Chest x-ray shows cardiomegaly with no other acute findings.  Shoulder x-  ray shows osteophytes, no other abnormalities.   IMPRESSION:  Musculoskeletal pain.   PLAN:  1. We will go ahead and discharge the patient to home, this is not      consistent with  any symptoms of mitral valve disorder or coronary      disease.  2. We offered the patient pain medicines and she declined.  3. We will give her Flexeril 10 mg p.o. t.i.d. p.r.n.      Titus Mould, MD  Electronically Signed     TRK/MEDQ  D:  06/30/2006  T:  07/01/2006  Job:  [id number]

## 2010-07-26 NOTE — Discharge Summary (Signed)
[name], [name]                  ACCOUNT NO.:  [id number]   MEDICAL RECORD NO.:  [id number]          PATIENT TYPE:  INP   LOCATION:  9312                          FACILITY:  [facility]   PHYSICIAN:  Maisie Fus, M.D.   DATE OF BIRTH:  [birth year]   DATE OF ADMISSION:  10/15/2004  DATE OF DISCHARGE:                                 DISCHARGE SUMMARY   DISCHARGE DIAGNOSES:  1.  Cystocele with prolapse.  2.  First degree rectocele.  3.  Vaginal vault descensus.   OPERATIVE PROCEDURE:  Anterior and posterior colporrhaphy, sacrospinous  ligament suspension of vagina.   INTRAOPERATIVE AND POSTOPERATIVE COMPLICATIONS:  None.   DISPOSITION:  The patient is in satisfactory condition at the time of her  discharge. She is progressing with her voiding trials and voiding small  amounts with relatively large residuals. She is to be discharged home with  catheter care instructions and supplies with a goal of voiding 200 mL or  more with residual of less than 100. She will return to the office when this  goal is reached. She is given Percocet 5/325 to be taken as needed for  postoperative pain. She is also given Motrin 800 mg to be taken one t.i.d.  as needed for pain. She is to resume all of her preoperative medications.  She is to take a regular diet. She is to call for fever or heavy bleeding.   Details of the present illness, past history, family history, and review of  systems are recorded in the admission note. Findings were remarkable for  vaginal relaxation.   Laboratory data during this admission includes normal serum electrolytes on  admission. Hemoglobin of 13.1 on admission. Postoperative electrolytes were  normal. Postoperative hemoglobin was 10.8. Admission prothrombin time and  PTT were normal.   HOSPITAL COURSE:  The patient was admitted on the morning of surgery. She  was treated perioperatively with IV antibiotics for heart murmur including  ampicillin and gentamycin. This was  continued for 24 hours after the  surgery. She was placed in PAS hose. The above-described surgical procedure  was accomplished without difficulty. Her postoperative course was without  complication. She remained essentially afebrile throughout her hospital  stay. By the morning of postoperative day #3 she was considered to be in  satisfactory condition for discharge.   DISPOSITION:  As noted above.      Maisie Fus, M.D.  Electronically Signed     WRN/MEDQ  D:  10/18/2004  T:  10/18/2004  Job:  [id number]

## 2010-07-26 NOTE — Discharge Summary (Signed)
[name], [name] NO.:  [id number]   MEDICAL RECORD NO.:  [id number]          PATIENT TYPE:  INP   LOCATION:  [id number]                         FACILITY:  [facility]   PHYSICIAN:  Kirk Ruths, M.D. LHCDATE OF BIRTH:  Aug 03, 1928   DATE OF ADMISSION:  05/07/2005  DATE OF DISCHARGE:  05/13/2005                                 DISCHARGE SUMMARY   The patient's primary cardiologist is Marijo Conception. Wall, M.D.   PRINCIPAL DIAGNOSIS:  Congestive heart failure in setting of severe mitral  regurgitation.   OTHER DIAGNOSES:  1.  Hypertension.  2.  Upper respiratory infection.  3.  Postmenopausal.  4.  History of left rotator cuff injury.   ALLERGIES:  SULFA, CODEINE and HYOSCYAMINE.   PROCEDURES:  Left heart cardiac catheterization.   HISTORY OF PRESENT ILLNESS:  A 77 year old white female who was recently  seen in the clinic by Dr. Jenell Milliner on May 06, 2005, secondary to a  three-month history of progressive exertional dyspnea and fatigue as well as  chest pain and orthopnea.  She was directly admitted from the office for  further evaluation after discovery of a holosystolic murmur.   HOSPITAL COURSE:  The patient underwent a 2-D echocardiogram on February 28,  which revealed severe mitral valve prolapse with mitral regurgitation and  elevated pulmonary artery pressure.  Plans were then made for right and left  heart cardiac catheterization.  Catheterization took place on March 1  revealing normal coronary arteries with an EF of approximately 60% and 3+  mitral regurgitation.  Her pulmonary artery wedge pressure was 18 mmHg with  V-waves of 35 mmHg.  A plan was then made for transesophageal echocardiogram  to further evaluate mitral regurgitation, and this revealed mildly thickened  mitral valve with a partially flail posterior leaflet, severe anterior leaf-  directed mitral regurgitation, with normal LV function and moderate left  atrial enlargement.  She has  been placed on Lasix and potassium replacement  therapy and has had some improvement in her dyspnea.  She has developed  while hospitalized a productive cough and was febrile on March 5 and was  placed on a Z-Pak as well as Mucinex.  Subsequently initiating antibiotic  therapy, she is markedly feeling better and is being discharged home today  in satisfactory condition.   DISCHARGE LABORATORY DATA:  Hemoglobin 12.6, hematocrit 36.3, WBC 4.8,  platelets 185.  Sodium 138, potassium 4.1, chloride 106, CO2 25, BUN 11,  creatinine 1.0, glucose 88, calcium 8.9.  Admission BNP was 110.9.   DISPOSITION:  The patient is being discharged home today in good condition.   FOLLOW-UP PLANS AND APPOINTMENTS:  She has a follow-up appointment with Dr.  Jenell Milliner on March 14 at 3:45 p.m.  She has a follow-up with primary care  physician, Dr. Elana Alm, in the next three to four weeks.   DISCHARGE MEDICATIONS:  1.  Lotensin 10 mg daily.  2.  Estradiol patch 0.05 mg each week.  3.  Premarin cream Tuesday and Friday.  4.  Verapamil 120 mg b.i.d.  5.  Lasix 20  mg daily.  6.  K-Dur 20 mEq daily.  7.  Zithromax 250 mg daily x3 more days.  8.  Mucinex 600 mg b.i.d. for upper respiratory symptoms.  9.  Aspirin 81 mg daily.   OUTSTANDING LAB STUDIES:  None.   DURATION OF DISCHARGE ENCOUNTER:  40 minutes.      Rogelia Mire, NP    ______________________________  Kirk Ruths, M.D. [facility]    CRB/MEDQ  D:  05/13/2005  T:  05/14/2005  Job:  [id number]   cc:   Thomas C. Wall, M.D.  [address]  [address]  [place]  [place] [address]   Genevie Ann, M.D.

## 2010-07-26 NOTE — Assessment & Plan Note (Signed)
[facility]                              CARDIOLOGY OFFICE NOTE   REIGHAN, DORSEY                         MRN:          [id number]  DATE:11/11/2005                            DOB:          1928-05-04    Ms. Alkhafaji returns today for further management of her chronic pericarditis.   Unfortunately, she did her second course of Advil, as outlined in our  previous note, with recurrence of sharp, stabbing pain in her chest.  She  says it is also bad when she leans over.  She placed herself back on Advil  but did not take one today.  She feels quite comfortable when she is on an  anti-inflammatory.   PHYSICAL EXAMINATION:  VITAL SIGNS:  Her blood pressures have also been  high, but today it is 132/83.  Her pulse is 80 and regular.  GENERAL:  She is in no acute distress.  Her weight is 116.  SKIN:  Skin color is good.  NECK:  Carotids are full with no JVD.  LUNGS:  Clear with no rub.  HEART:  Regular rate and rhythm with a soft systolic murmur but no rub.  ABDOMEN:  Soft with good bowel sounds.  EXTREMITIES:  No edema.  Pulses are intact.   I have had a long talk with Ms. Dragovich. We are getting into a chronic  pericarditic picture, which we are going to have a harder and harder time  breaking, both physically and mentally.   After a long discussion, we decided to place her on Celebrex 200 mg once  daily for starters.  Have her return in November to see if we can start  weaning this down to 100 mg a day and then stopping it altogether by the  time of the end of the year.  Hopefully, she will not require b.i.d.  Celebrex.   I am delighted her blood pressure is better.  I have made no changes in that  program.                               Marcello Moores C. Verl Blalock, MD, [facility]    TCW/MedQ  DD:  11/11/2005  DT:  11/11/2005  Job #:  [id number]   cc:   Dr. Herbie Baltimore Day

## 2010-07-26 NOTE — Discharge Summary (Signed)
NAMEBREINDY, TRIMARCO                  ACCOUNT NO.:  [id number]   MEDICAL RECORD NO.:  [id number]          PATIENT TYPE:  INP   LOCATION:  9317                          FACILITY:  [facility]   PHYSICIAN:  Daleen Bo. Gaetano Net, M.D. DATE OF BIRTH:  07/20/1928   DATE OF ADMISSION:  04/16/2006  DATE OF DISCHARGE:  04/19/2006                               DISCHARGE SUMMARY   ADMITTING DIAGNOSES:  1. Cystocele.  2. Rectocele.  3. Stress urinary continence.   DISCHARGE DIAGNOSES:  1. Cystocele.  2. Rectocele.  3. Stress urinary continence.   PROCEDURE:  On April 16, 2006 anterior repair with Perigee graft,  posterior repair with [place]ogee graft, Lynx mid-urethral sling,  sacrospinous ligament suspension and cystoscopy.   REASON FOR ADMISSION:  The patient is a 77 year old married white female  G4, P4 status post hysterectomy, status post A&P repair in 2006 with  recurrent symptoms.  Details are dictated in history and physical.  She  is admitted for surgical management.   HOSPITAL COURSE:  The patient is admitted to the hospital and undergoes  the above procedure.  On the evening of surgery she is sore but getting  relief with pain medication.  Has not yet passed flatus.  Vital signs  are stable and she is afebrile.  Urine output is clear.  On the first  postoperative day she has not yet passed flatus.  Foley catheter has  just been removed and her first voiding attempt was unsuccessful.  She  is tolerating liquids.  Vital signs are stable and she is afebrile.  Blood pressures are 123456 systolic over [id number] diastolic range.  Her  metoprolol was held that morning.  She is continued on her Diovan.  Hemoglobin is 12.0.  On the second postoperative day she is ambulating  and passing flatus and tolerating regular diet.  However, she lives 1  hour from home, is quite fatigued and does not feel she is up to the  trip home.  Vital signs are stable and she is afebrile.  She is observed  for one more  night.  On the day of discharge her energy is better and  she is feeling good.  She is ambulating, voiding, passing flatus,  tolerating regular diet, and voiding without difficulty.  Vital signs  remain stable and she is afebrile.   CONDITION ON DISCHARGE:  Good.   DIET:  Regular as tolerated.   ACTIVITY:  No lifting, no operation of automobiles, no vaginal entry.  She is to call the office for problems including not limited to  temperature of 101 degrees, heavy vaginal bleeding, increasing pain, or  persistent nausea and vomiting.   MEDICATIONS:  1. Percocet 5/325 mg #30 one p.o. q.6 h. p.r.n.  2. Ibuprofen 600 mg q.6 h. p.r.n.  3. Colace and milk of magnesia as needed.  4. She will resume her previous medications.   FOLLOW UP:  Followup is in the office in 2 weeks.      Daleen Bo Gaetano Net, M.D.  Electronically Signed     JET/MEDQ  D:  04/19/2006  T:  04/19/2006  Job:  [id number]

## 2010-07-26 NOTE — Discharge Summary (Signed)
[name], [name]                              ACCOUNT NO.:  [id number]   MEDICAL RECORD NO.:  [id number]                   PATIENT TYPE:  INP   LOCATION:  5151                                 FACILITY:  [facility]   PHYSICIAN:  Norberto Sorenson T. Fuller Plan, M.D. [facility]          DATE OF BIRTH:  1928-08-14   DATE OF ADMISSION:  02/10/2002  DATE OF DISCHARGE:  02/15/2002                                 DISCHARGE SUMMARY   ADMITTING DIAGNOSES:  1. Hematochezia with abdominal pain.  Rule out diverticular bleed, rule out     ischemic colitis, rule out infectious colitis, rule out diverticulitis.  2. Hematuria, question as to whether this is contaminant from rectal     bleeding or urologic in origin.  3. Right foot pain.  4. Mild hypokalemia.  5. History of hypertension controlled on multiple medications.  6. History of colon polyps.  7. History of colon diverticulosis.  8. Transient ischemic attack in the early 1990s.  9. History abnormal Pap smear with questionable cervical cancer.  Status     post total abdominal hysterectomy with salpingo-oophorectomy.  10.      Questionable appendectomy at the time of her pelvic surgery.   DISCHARGE DIAGNOSES:  1. Colitis, probably ischemic.  2. Hematochezia and abdominal pain secondary to colitis, resolved.  3. Hematuria.  A urine cytology is showing atypical cells will need urologic     followup.  4. Hypertension.  The patient is normotensive on all of her blood pressure     medicines except for hydrochlorothiazide which we will continue to hold.  5. Ongoing hormone therapy since the time of her hysterectomy.  Now     discontinued secondary to its being a risk factor for ischemic colitis.     Question as to whether the patient had cervical cancer or just abnormal     Pap smear leading to her hysterectomy/oophorectomy.  6. Hypokalemia, resolved.  7. Mild, normocytic anemia.  The patient was not started on any iron     supplementation.   PROCEDURES:  None.   CONSULTATIONS:  None.   BRIEF HISTORY:  The patient is a pleasant and generally active and healthy  77 year old white female with treated hypertension.  She had had prior  colonoscopy by Dr. Olevia Perches as recently as January 2002 showing left-sided  diverticulosis, hemorrhoids and a tubular adenomatous polyp in the hepatic  flexure.  The patient take a baby aspirin every day for remote history of  TIAs.   On the evening prior to this admission she developed nausea, vomiting, and  loose stools, she did not throw up any bloody or coffee-grounds material and  stopped vomiting within 2 or 3 hours.  Later that evening she developed  stool mixed with blood and later than that through the following morning she  started passing purely bloody stools and developed moderately severe left  lower quadrant pain.  She was seen  by Dr. Sherrlyn Hock at [facility], white count there was around 16,000, hemoglobin was 14.  She was referred to Dr. Olevia Perches and was sent to the emergency room at [facility] where she was evaluated and admitted with abdominal pain and  hematochezia.  Pulse was 126 and blood pressure 137/64.  She was afebrile  and had no history of sweats or chills.   LABORATORIES:  Initial hemoglobin 12.3, it was 11.1 at discharge.  Hematocrit 35.7 on admission, 32.2 at discharge.  MCV was 89.4.  Platelets  ranging 195-248.  PT 13.1, INR 0.9, PTT 29.  Potassium initially 3.0  corrected to 3.8.  Sodium 137.  Glucose was as high as 140, it was 97 once  IV fluids were discontinued.  BUN was 16.  Creatinine 0.9.  Calcium low at  7.6.  Albumin 2.0.  Total bilirubin 0.8, alkaline phosphatase 52, AST 22,  ALT 16.  Lipase was 18.  Urinalysis showed a red appearance of turbid urine.  There was a large amount of hemoglobin, small amount of bilirubin. There was  a small amount of leukocyte esterase.  Rare epithelial cells, only 0-2 white  blood cells.  Red blood cells were too numerous too  count.  There were a few  bacteria.  Urine culture was negative.  Urine cytology showed atypical  urothelial cells.  This may be secondary to lithiasis, inflammation or  instrumentation but a low-grade papillary urothelial neoplasm cannot be  excluded.   HOSPITAL COURSE:  The patient was admitted to unit 5100.  She was  hemodynamically stable and was supported with IV fluids.  She did have  temperatures to a maximum of 101.1 within the first 24 hours of  hospitalization.  She then had CT scan showing transverse and descending  colitis.  Given her overall clinical presentation this was felt to be  ischemic in nature.  She was however, treated for a few days with Cipro and  Flagyl just in case it was infectious.   From a colitis standpoint, the patient had slow but steady resolution of  abdominal pain.  Her hematochezia resolved within 36 hours.  Towards the end  of her hospitalization the patient was not having any stools or any  bleeding.  She was tolerating first clear liquids and then a solid diet  without difficulty.  She was afebrile following the initial 24 hours of  hospitalization.   The patient mentioned that she had seen some blood when wiping after  urinating prior to her coming to the hospital.  It was thought that maybe  this was contaminant from rectal bleeding.  However, she did display gross  hematuria in the hospital on one occasion and urine cytologies were then  sent.  These have confirmed that she does have atypical cells and she will  need further followup of this, therefore, an appointment was made for her to  see Dr. Rana Snare Friday, February 25, 2002 in his office for further  evaluation.   Regarding the patient's chronic medications, these were reviewed and the  Premarin was discontinued because this is a known risk factor for ischemic colitis.  The patient was started on this long ago when she had hysterectomy  and recently the dose of Premarin had been  decreased by her physician.  At  this point being that she is in her 51s it seems an unnecessary drug and  will be discontinued.  The patient's blood pressure medications of Lotensin  and  verapamil were continued.  However, her hydrochlorothiazide was held.  Blood pressure monitoring here in the hospital showed that with just the  Lotensin and verapamil that her blood pressure was well controlled  therefore, the hydrochlorothiazide was not restarted.  Hypotension, even  transient, is a risk factor for ischemic colitis so her blood pressure will  need to be monitored by her family doctor and if she becomes hypertensive  the hydrochlorothiazide could be restarted but for now she will not take it.   Other medications which she was advised to avoid for the next 3 weeks were  Advil which she takes p.r.n. and baby aspirin which she was taking daily.   CONDITION AT DISCHARGE:  Stable and improved.   FOLLOW UP APPOINTMENT:  She has an appointment Wednesday, March 23, 2002  at 2:40 a.m. with Delfin Edis in the office.  She has an appointment with  Dr. Rana Snare on Friday, February 25, 2002 at 11 a.m. in his office.  She  is to make appointments with her family doctor, Dr. Olevia Bowens, for future primary  care followups.   MEDICATIONS AT DISCHARGE:  1. Lotensin 10 mg p.o. q.d.  2. Verapamil 240 mg p.o. q.d.   DIET AT DISCHARGE:  Regular.  She tends to eat very moderately anyway.  However, if she notices increase in her abdominal pain, she can lighten up  on her diet.  If abdominal pain is persistent or severe and/or she  redevelops hematochezia, she is to call Dr. Nichola Sizer office.     Gerre Couch, P.A. [facility]                    Malcolm T. Fuller Plan, M.D. [facility]    SG/MEDQ  D:  02/15/2002  T:  02/15/2002  Job:  [id number]   cc:   Delfin Edis, M.D. [facility]. Sherrlyn Hock, M.D.  also [facility]. Olevia Bowens, M.D.  [place], [place] [address]   Bernestine Amass,  M.D.  [address] [place]  [place] [address]  Fax: [phone]

## 2010-07-26 NOTE — H&P (Signed)
[name], [name]                  ACCOUNT NO.:  [id number]   MEDICAL RECORD NO.:  [id number]          PATIENT TYPE:  AMB   LOCATION:  [place]                           FACILITY:  [facility]   PHYSICIAN:  Maisie Fus, M.D.   DATE OF BIRTH:  Jan 02, 1929   DATE OF ADMISSION:  10/15/2004  DATE OF DISCHARGE:                                HISTORY & PHYSICAL   ADMITTING DIAGNOSIS:  Cystocele, rectocele, symptomatic.   HISTORY OF PRESENT ILLNESS:  The patient is a 77 year old white married  female, gravida 4, para 4, who is currently symptomatic with cystocele and  rectocele.  She is now admitted for anterior and posterior vaginal repair.  She does have significant GYN history with grade I, stage I endometrial  carcinoma diagnosis at hysterectomy in 1986.  The procedure at that time was  a total abdominal hysterectomy and bilateral salpingo-oophorectomy.  She has  subsequently been maintained on hormone replacement therapy throughout the  interval since that surgery, and actually started hormone replacement before  that surgery at approximately the age of 9.   CURRENT REVIEW OF SYSTEMS:  Negative for cardiopulmonary or GI symptoms at  the present time.   PAST MEDICAL HISTORY:  The patient is known to have hypertension, for which  she takes 2 medications - __________ and verapamil.  She has no other known  significant medical illnesses.  She is, however, known to have osteopenia,  and this is the reason for continued use of estrogen and calcium  supplementation.   MEDICATIONS:  1.  __________ 10 mg a day.  2.  Verapamil one-half of a 240 mg tablet a day.  3.  She also takes baby aspirin 81 mg a day, but has stopped this      approximately 10-12 days before surgery.  4.  Climara 0.025 mg.  This is done for hormone replacement therapy.   SOCIAL HISTORY:  She is not a cigarettes smoker.  Does not use alcohol.   FAMILY HISTORY:  Noncontributory.   PHYSICAL EXAMINATION:  VITAL SIGNS:  Blood  pressure in the office was  142/90.  HEENT:  Grossly within normal limits.  Thyroid gland is not palpably  enlarged.  CHEST:  Clear to auscultation.  HEART:  Normal sinus rhythm.  There is a late systolic crescendo murmur,  grade 2/6, at the left second intercostal space.  There are no other audible  murmurs, no other rubs or gallops.  CHEST:  Clear to auscultation.  BREAST EXAM:  Considered to be normal.  No palpable masses.  No skin change  or nipple discharge.  (The last mammogram was normal in November of 2005.)  ABDOMEN:  Soft and nontender without appreciable organomegaly or palpable  masses.  EXTREMITIES:  Without clubbing, cyanosis, or edema.  PELVIC:  The external genitalia, vagina, and cervix are normal except for  mild relaxation of the vaginal outlet.  There is a third degree cystocele  which prolapses with Valsalva.  There is a first degree rectocele.  The cuff  is intact and without visible abnormality.  There is first degree  vaginal  vault prolapse.   ASSESSMENT:  Symptomatic pelvic relaxation with cystocele with prolapse,  first degree rectocele, and vaginal vault prolapse.   PLAN:  Anterior and posterior colporrhaphy, probable sacrospinous ligament  suspension of vaginal vault, and antibiotic prophylaxis for heart murmur.       WRN/MEDQ  D:  10/14/2004  T:  10/14/2004  Job:  [id number]

## 2010-07-26 NOTE — H&P (Signed)
[name], [name]                  ACCOUNT NO.:  [id number]   MEDICAL RECORD NO.:  [id number]          PATIENT TYPE:  AMB   LOCATION:  [place]                           FACILITY:  [facility]   PHYSICIAN:  Daleen Bo. Gaetano Net, M.D. DATE OF BIRTH:  03-24-1928   DATE OF ADMISSION:  04/16/2006  DATE OF DISCHARGE:                              HISTORY & PHYSICAL   CHIEF COMPLAINT:  Recurrent cystocele.   HISTORY OF PRESENT ILLNESS:  This patient is a 77 year old married lady,  G4, P4, status post hysterectomy, status post A&P repair in 10/2004, who  has a recurrent cystocele.  This is quite uncomfortable for her.  In  addition, she has symptoms of stress urinary incontinence.  She also has  stress urinary incontinence as demonstrated on urodynamic testing.  After discussion of the options, she is being admitted for anterior  repair with Perigee graft, possible posterior repair with Apogee graft,  and a midurethral sling.  Potential risks and complications have been  discussed preoperatively.   PAST MEDICAL HISTORY:  1. Chronic hypertension.  2. Osteopenia.  3. Pulmonary hypertension.  4. Mitral stenosis, status post valve replacement.   PAST SURGICAL HISTORY:  1. Hysterectomy.  2. Anterior posterior vaginal repair.  3. Mitral valve replacement.   MEDICATIONS:  1. Metoprolol 50 mg daily.  2. Celebrex 200 mg daily.  3. Diovan 320/25 daily.  4. Vivelle dot 0.825 mg daily.   ALLERGIES:  SULFA AND CODEINE.   REVIEW OF SYSTEMS:  NEURO:  Denies headache.  GI:  Denies recent changes  in bowel habits.   PHYSICAL EXAMINATION:  VITAL SIGNS:  Height 5 feet 4 inches.  Weight 110  pounds.  Blood pressure 142/80.  HEENT:  Without thyromegaly.  LUNGS:  Clear to auscultation.  HEART:  Regular rate and rhythm.  BACK:  Without CVA tenderness.  BREAST:  Not examined.  ABDOMEN:  Soft, nontender without masses.  PELVIC:  Vulva and vagina without lesions.  There is a cystocele at the  vaginal introitus.   Fair support posteriorly and at the cuff.  EXTREMITIES:  Grossly within normal limits.  NEUROLOGIC:  Grossly within normal limits.   ASSESSMENT:  Recurrent cystocele and stress urinary incontinence.   PLAN:  Anterior repair with Perigee graft, midurethral sling, possible  posterior repair with Apogee graft.      Daleen Bo Gaetano Net, M.D.  Electronically Signed     JET/MEDQ  D:  04/14/2006  T:  04/14/2006  Job:  [id number]

## 2010-07-26 NOTE — Op Note (Signed)
NAMECARLEENE, CLAR                  ACCOUNT NO.:  [id number]   MEDICAL RECORD NO.:  [id number]          PATIENT TYPE:  AMB   LOCATION:  [place]                           FACILITY:  [facility]   PHYSICIAN:  Daleen Bo. Gaetano Net, M.D. DATE OF BIRTH:  April 29, 1928   DATE OF PROCEDURE:  04/16/2006  DATE OF DISCHARGE:                               OPERATIVE REPORT   PREOPERATIVE DIAGNOSIS:  Cystocele, rectocele, stress urinary  incontinence.   POSTOPERATIVE DIAGNOSIS:  Cystocele, rectocele, stress urinary  incontinence.   PROCEDURE:  Anterior repair with perigee graft, posterior repair with  apogee graft, Lynx mid urethral sling, sacrospinous ligament suspension  and cystoscopy.   SURGEON:  Daleen Bo. Gaetano Net, M.D.   ASSISTANT:  Darlyn Chamber, M.D.   ANESTHESIA:  General with endotracheal intubation.   ESTIMATED BLOOD LOSS:  200 mL.   INDICATIONS AND CONSENT:  This patient is a 77 year old married white  female G4, P4, status post hysterectomy and then status post A&P repair  in 2006, has recurrent pelvic relaxation and stress urinary continence.  Details are dictated in the history and physical.  Anterior-posterior  repair with grafts and mid urethral sling has been discussed with the  patient preoperatively.  Potential risks and complications have been  reviewed including but limited to infection, bowel, bladder, ureteral  damage, bleeding requiring transfusion of blood products with possible  transfusion reaction, HIV and hepatitis acquisition, DVT, PE, pneumonia,  fistula formation, pelvic pain, dyspareunia, recurrent urinary  incontinence, erosion, long-term catheterization, self-catheterization  or return to the OR.  All questions have been answered and consent is  signed on the chart.   PROCEDURE:  The patient is taken to the operating room, placed in the  dorsal supine position.  After identification, general anesthesia was  induced via endotracheal intubation.  She is then placed in  dorsal  lithotomy position where she is prepped, bladder is straight  catheterized and she is draped in sterile fashion.  The anterior vaginal  mucosa is grasped at the fornix and the anterior mucosa is injected with  0.50% Xylocaine with 1:200,000 epinephrine.  An inverted T-shaped  incision is made and dissection is carried out to approximately 3-4 cm  below the urethral meatus.  Mucosa is dissected bilaterally sharply and  bluntly.  Then the location of the four incisions for the perigee graft  is carefully made and injected with of the same solution of local  anesthetic.  The anterior needles are initially passed and the passage  of the needle is monitored throughout with the examining finger.  The  pink arms are then attached and they are withdrawn bilaterally.  The  posterior needles are then passed again.  The passage of the needle  followed with the examining finger bilaterally.  The posterior arms are  attached and withdrawn as well.  The graft is trimmed to fit.  It is  anchored to the posterior aspect of the anterior vaginal mucosa in the  midline as well as one anteriorly.  Two others on the posterior corners  are used to anchor as well.  These are all with 0 Monocryl.  The  anterior mucosa is closed in running locking fashion with 2-0 Vicryl  suture.  The suburethral anterior vaginal mucosa is then incised in the  midline and dissected bilaterally.  Foley catheter is in the bladder and  is used to drain the bladder.  After marking the suprapubic locations  one finger lateral to the midline bilaterally and injecting with local  anesthetic, incisions are made and the [place] needles are passed  bilaterally, again the suburethral passage followed with the examining  finger.  The arms of the sling are then attached withdrawn through the  suprapubic incisions.  Foley catheter is then removed and cystoscopy is  carried out with 70 degree scope.  There is no evidence of foreign body   perforation.  A good puff of urine is noted from the ureters  bilaterally.  Indigo carmine had previously been injected to facilitate  in this.  Foley catheter is replaced, the bladder is drained.  The sling  is tensioned so that it is resting just against the tissue, but a Kelly  clamp can be placed below the sling and rotated perpendicular to the  floor without any tension.  The mucosa is closed with 2-0 running  locking Vicryl suture.  The arms are trimmed at the level of the skin  suprapubically.  The arms of the perigee graft are also properly  tensioned and taken down as well.  Posterior vaginal mucosa is then  injected with the same solution of local anesthetic with epinephrine.  Posterior vaginal mucosa is taken down in the midline.  The rectum is  dissected from the vaginal mucosa bilaterally and also down from the  apex of the vagina with careful sharp and blunt dissection.  Then using  the Capio needle driver a 0 permanent suture is placed through the left  sacrospinous ligament one to two fingerbreadths medial to the spine.  This is anchored to the posterior vaginal mucosa.  After marking the  appropriate location, I injected with local anesthetic, incisions are  made and apogee needles are passed out bilaterally with the needle tips  followed with the examining finger.  The arms are applied and then  withdrawn through the incisions with careful attention paid to not  placing any undue traction on the pelvic sidewalls.  After trimming to  fit, the apical portion of the graft is anchored in the midline with a  single suture to the posterior vaginal mucosa.  The sacrospinous stitch  is then tied down and elevates the vaginal apex nicely.  The posterior  vaginal mucosa is closed in a running locking fashion with 2-0 Vicryl  suture.  After appropriate tensioning, the arms of the apogee graft are  also trimmed at the level of the skin after removing the sheaths.  All the incisions  are cleaned and Dermabond is applied.  Moistened Kerlix is  used to pack the vagina.  All counts are correct.  The patient is  awakened and taken to the recovery room in stable condition.      Daleen Bo Gaetano Net, M.D.  Electronically Signed     JET/MEDQ  D:  04/16/2006  T:  04/16/2006  Job:  [id number]

## 2010-07-26 NOTE — Assessment & Plan Note (Signed)
[facility]                              CARDIOLOGY OFFICE NOTE   STARASIA, ESCOVAR                         MRN:          [id number]  DATE:11/28/2005                            DOB:          [birth year]    PHONE CALL  I got a phone call today from Dr. Dory Horn of [facility].  Ms.  Hiraoka needs a re-do bladder repair.   We had a long discussion about her cardiac condition including her recurrent  pericarditis.  I advised him to continue the Celebrex 200 mg a day, which  seems to be working at present.  I think her bleeding risk is minimal.  He  will cover her with appropriate antibiotics.                               Thomas C. Verl Blalock, MD, [facility]    TCW/MedQ  DD:  11/28/2005  DT:  12/01/2005  Job #:  [id number]   cc:   Maisie Fus, M.D.

## 2010-10-04 ENCOUNTER — Encounter: Payer: Self-pay | Admitting: Cardiology

## 2010-10-09 ENCOUNTER — Ambulatory Visit (INDEPENDENT_AMBULATORY_CARE_PROVIDER_SITE_OTHER): Payer: Medicare Other | Admitting: Cardiology

## 2010-10-09 ENCOUNTER — Encounter: Payer: Self-pay | Admitting: Cardiology

## 2010-10-09 VITALS — BP 130/80 | HR 53 | Resp 12 | Ht 64.0 in | Wt 119.0 lb

## 2010-10-09 DIAGNOSIS — I059 Rheumatic mitral valve disease, unspecified: Secondary | ICD-10-CM

## 2010-10-09 DIAGNOSIS — I340 Nonrheumatic mitral (valve) insufficiency: Secondary | ICD-10-CM | POA: Insufficient documentation

## 2010-10-09 DIAGNOSIS — Z8679 Personal history of other diseases of the circulatory system: Secondary | ICD-10-CM

## 2010-10-09 DIAGNOSIS — I058 Other rheumatic mitral valve diseases: Secondary | ICD-10-CM

## 2010-10-09 DIAGNOSIS — I10 Essential (primary) hypertension: Secondary | ICD-10-CM

## 2010-10-09 NOTE — Patient Instructions (Signed)
Your physician recommends that you schedule a follow-up appointment in: 1 year with Dr.Wall

## 2010-10-09 NOTE — Progress Notes (Signed)
HPI [name] returns for the evaluation and management of her history of mitral valve prolapse, status post mitral valve repair, hypertension, and postoperative pericarditis and atrial fibrillation.  She continues to be very active and offers no complaints of significant dyspnea on exertion, orthopnea, or edema. She denies recurrent symptoms of atrial fibrillation. She also denies any syncope or presyncope.  EKG today shows normal sinus rhythm with a first-degree block which is stable. She has a right bundle branch block. Past Medical History  Diagnosis Date  . Atrial fibrillation     postoperative  . MR (mitral regurgitation)     severe  . Pericarditis     postoperative  . HTN (hypertension)   . CVA (cerebral vascular accident)   . Arthritis     Past Surgical History  Procedure Date  . Anterior repair with perigee graft 04/16/06    posterior repair with apogee graft; lynx mid urethral sling; sacrospinous ligamnet suspension and cystoscopy; Dr. Joya Martyr  . Anterior and posterior vaginal repair 10/15/04    Dr. Nori Riis   . Sacral spinous ligament suspension of vagina   . Suprapubic cystectomy   . Total abdominal hysterectomy w/ bilateral salpingoophorectomy   . Mitral valve replacement     Family History  Problem Relation Age of Onset  . Colon cancer Brother   . Heart disease Father   . Heart disease Mother     History   Social History  . Marital Status: Married    Spouse Name: N/A    Number of Children: N/A  . Years of Education: N/A   Occupational History  . Not on file.   Social History Main Topics  . Smoking status: Never Smoker   . Smokeless tobacco: Not on file  . Alcohol Use: No  . Drug Use: No  . Sexually Active: Not on file   Other Topics Concern  . Not on file   Social History Narrative   Married, retired.     Allergies  Allergen Reactions  . Aspirin-Caffeine   . Codeine     REACTION: upset stomach  . Sulfonamide Derivatives     REACTION:  swollen tongue    Current Outpatient Prescriptions  Medication Sig Dispense Refill  . amoxicillin (AMOXIL) 500 MG capsule Take 500 mg by mouth as needed. Dental use      . Cholecalciferol (VITAMIN D3) 10000 UNITS capsule Take 10,000 Units by mouth daily.        Mariane Baumgarten Calcium (STOOL SOFTENER PO) Take by mouth as needed.        Marland Kitchen estradiol (ESTRACE) 0.1 MG/GM vaginal cream Place 2 g vaginally as directed.        Marland Kitchen estradiol (VIVELLE-DOT) 0.025 MG/24HR Place 1 patch onto the skin 2 (two) times a week.        . hydrochlorothiazide 25 MG tablet Take 25 mg by mouth daily.        . irbesartan (AVAPRO) 300 MG tablet Take 300 mg by mouth daily.        . metoprolol (LOPRESSOR) 50 MG tablet Take 50 mg by mouth 2 (two) times daily.          ROS Negative other than HPI.   PE General Appearance: well developed, well nourished in no acute distress HEENT: symmetrical face, PERRLA, good dentition  Neck: no JVD, thyromegaly, or adenopathy, trachea midline Chest: symmetric without deformity Cardiac: PMI non-displaced, RRR, normal S1, S2, no gallop, very minimal systolic murmur at the apex.Lung: clear to ausculation  and percussion Vascular: all pulses full without bruits  Abdominal: nondistended, nontender, good bowel sounds, no HSM, no bruits Extremities: no cyanosis, clubbing or edema, no sign of DVT, no varicosities  Skin: normal color, no rashes Neuro: alert and oriented x 3, non-focal Pysch: normal affect Filed Vitals:   10/09/10 1136  BP: 130/80  Pulse: 53  Resp: 12  Height: 5\' 4"  (1.626 m)  Weight: 119 lb (53.978 kg)    EKG  Labs and Studies Reviewed.   No results found for this basename: WBC, HGB, HCT, MCV, PLT      Chemistry   No results found for this basename: NA, K, CL, CO2, BUN, CREATININE, GLU   No results found for this basename: CALCIUM, ALKPHOS, AST, ALT, BILITOT       No results found for this basename: CHOL   No results found for this basename: HDL   No  results found for this basename: LDLCALC   No results found for this basename: TRIG   No results found for this basename: CHOLHDL   No results found for this basename: HGBA1C   No results found for this basename: ALT, AST, GGT, ALKPHOS, BILITOT   No results found for this basename: TSH

## 2011-02-09 ENCOUNTER — Other Ambulatory Visit: Payer: Self-pay | Admitting: Cardiology

## 2011-03-24 ENCOUNTER — Other Ambulatory Visit: Payer: Self-pay | Admitting: Cardiology

## 2011-04-07 DIAGNOSIS — Z1231 Encounter for screening mammogram for malignant neoplasm of breast: Secondary | ICD-10-CM | POA: Diagnosis not present

## 2011-06-23 ENCOUNTER — Other Ambulatory Visit: Payer: Self-pay | Admitting: Dermatology

## 2011-06-23 DIAGNOSIS — D0460 Carcinoma in situ of skin of unspecified upper limb, including shoulder: Secondary | ICD-10-CM | POA: Diagnosis not present

## 2011-06-23 DIAGNOSIS — L570 Actinic keratosis: Secondary | ICD-10-CM | POA: Diagnosis not present

## 2011-07-24 ENCOUNTER — Other Ambulatory Visit: Payer: Self-pay | Admitting: Cardiology

## 2011-08-21 ENCOUNTER — Other Ambulatory Visit: Payer: Self-pay | Admitting: Cardiology

## 2011-08-21 NOTE — Telephone Encounter (Signed)
Refilled avapro

## 2011-08-27 DIAGNOSIS — Z01419 Encounter for gynecological examination (general) (routine) without abnormal findings: Secondary | ICD-10-CM | POA: Diagnosis not present

## 2011-08-27 DIAGNOSIS — Z1212 Encounter for screening for malignant neoplasm of rectum: Secondary | ICD-10-CM | POA: Diagnosis not present

## 2011-08-27 DIAGNOSIS — B373 Candidiasis of vulva and vagina: Secondary | ICD-10-CM | POA: Diagnosis not present

## 2011-08-27 DIAGNOSIS — Z130 Encounter for screening for diseases of the blood and blood-forming organs and certain disorders involving the immune mechanism: Secondary | ICD-10-CM | POA: Diagnosis not present

## 2011-09-25 ENCOUNTER — Other Ambulatory Visit: Payer: Self-pay | Admitting: Cardiology

## 2011-11-05 ENCOUNTER — Ambulatory Visit: Payer: Medicare Other | Admitting: Cardiology

## 2011-11-06 ENCOUNTER — Ambulatory Visit (INDEPENDENT_AMBULATORY_CARE_PROVIDER_SITE_OTHER): Payer: Medicare Other | Admitting: Cardiology

## 2011-11-06 ENCOUNTER — Encounter: Payer: Self-pay | Admitting: Cardiology

## 2011-11-06 VITALS — BP 142/80 | HR 61 | Ht 64.0 in | Wt 117.0 lb

## 2011-11-06 DIAGNOSIS — R5383 Other fatigue: Secondary | ICD-10-CM | POA: Insufficient documentation

## 2011-11-06 DIAGNOSIS — I951 Orthostatic hypotension: Secondary | ICD-10-CM

## 2011-11-06 DIAGNOSIS — I059 Rheumatic mitral valve disease, unspecified: Secondary | ICD-10-CM

## 2011-11-06 DIAGNOSIS — I10 Essential (primary) hypertension: Secondary | ICD-10-CM

## 2011-11-06 DIAGNOSIS — R5381 Other malaise: Secondary | ICD-10-CM

## 2011-11-06 DIAGNOSIS — R531 Weakness: Secondary | ICD-10-CM | POA: Insufficient documentation

## 2011-11-06 DIAGNOSIS — Z8679 Personal history of other diseases of the circulatory system: Secondary | ICD-10-CM | POA: Diagnosis not present

## 2011-11-06 DIAGNOSIS — I2789 Other specified pulmonary heart diseases: Secondary | ICD-10-CM

## 2011-11-06 DIAGNOSIS — I340 Nonrheumatic mitral (valve) insufficiency: Secondary | ICD-10-CM

## 2011-11-06 HISTORY — DX: Orthostatic hypotension: I95.1

## 2011-11-06 LAB — BASIC METABOLIC PANEL
CO2: 29 mEq/L (ref 19–32)
Chloride: 101 mEq/L (ref 96–112)
Creatinine, Ser: 0.9 mg/dL (ref 0.4–1.2)
Potassium: 3.7 mEq/L (ref 3.5–5.1)
Sodium: 138 mEq/L (ref 135–145)

## 2011-11-06 LAB — CBC WITH DIFFERENTIAL/PLATELET
Basophils Relative: 0.4 % (ref 0.0–3.0)
Eosinophils Absolute: 0.1 10*3/uL (ref 0.0–0.7)
Eosinophils Relative: 1.6 % (ref 0.0–5.0)
HCT: 37.8 % (ref 36.0–46.0)
Hemoglobin: 12.5 g/dL (ref 12.0–15.0)
Lymphs Abs: 1.6 10*3/uL (ref 0.7–4.0)
MCHC: 33 g/dL (ref 30.0–36.0)
MCV: 94.8 fl (ref 78.0–100.0)
Monocytes Absolute: 0.4 10*3/uL (ref 0.1–1.0)
Neutro Abs: 3.8 10*3/uL (ref 1.4–7.7)
RBC: 3.98 Mil/uL (ref 3.87–5.11)

## 2011-11-06 MED ORDER — METOPROLOL TARTRATE 50 MG PO TABS: 25.0000 mg | ORAL_TABLET | Freq: Two times a day (BID) | ORAL | Status: DC

## 2011-11-06 NOTE — Progress Notes (Signed)
HPI [name] returns today for evaluation and management of her history of mitral valve repair for mitral valve prolapse, postoperative A. fib and pericarditis without recurrence, and a history of hypertension and CVA.  She's been complaining of intermittent weak spells that come on her rather suddenly. She also feels lightheaded at times when she stands up and starts to walk too quickly. She denies any chest pain during that time, palpitations, or syncope. She feels like at imes she might faint if she didn't sit down. She denies any focal neurological complaints. She has a history of mild pulmonary hypertension preop. She is has a history of hypertension and has been hard to control in the past. She did not take any of her blood pressure meds this morning and her pressures 142/80.  Past Medical History  Diagnosis Date  . Atrial fibrillation     postoperative  . MR (mitral regurgitation)     severe  . Pericarditis     postoperative  . HTN (hypertension)   . CVA (cerebral vascular accident)   . Arthritis     Current Outpatient Prescriptions  Medication Sig Dispense Refill  . amoxicillin (AMOXIL) 500 MG capsule Take 500 mg by mouth as needed. Dental use      . Cholecalciferol (VITAMIN D3) 10000 UNITS capsule Take 10,000 Units by mouth daily.        Mariane Baumgarten Calcium (STOOL SOFTENER PO) Take by mouth as needed.        Marland Kitchen estradiol (ESTRACE) 0.1 MG/GM vaginal cream Place 2 g vaginally as directed.        Marland Kitchen estradiol (VIVELLE-DOT) 0.025 MG/24HR Place 1 patch onto the skin 2 (two) times a week.        . hydrochlorothiazide (HYDRODIURIL) 25 MG tablet TAKE ONE TABLET BY MOUTH EVERY DAY  30 tablet  4  . irbesartan (AVAPRO) 300 MG tablet TAKE ONE TABLET BY MOUTH EVERY DAY  30 tablet  3  . metoprolol (LOPRESSOR) 50 MG tablet TAKE ONE TABLET BY MOUTH TWICE DAILY  60 tablet  9    Allergies  Allergen Reactions  . Aspirin-Caffeine   . Codeine     REACTION: upset stomach  . Sulfonamide  Derivatives     REACTION: swollen tongue    Family History  Problem Relation Age of Onset  . Colon cancer Brother   . Heart disease Father   . Heart disease Mother     History   Social History  . Marital Status: Married    Spouse Name: N/A    Number of Children: N/A  . Years of Education: N/A   Occupational History  . Not on file.   Social History Main Topics  . Smoking status: Never Smoker   . Smokeless tobacco: Not on file  . Alcohol Use: No  . Drug Use: No  . Sexually Active: Not on file   Other Topics Concern  . Not on file   Social History Narrative   Married, retired.     ROS ALL NEGATIVE EXCEPT THOSE NOTED IN HPI  PE  General Appearance: well developed, well nourished in no acute distress, looks slightly dry. HEENT: symmetrical face, PERRLA, good dentition  Neck: no JVD, thyromegaly, or adenopathy, trachea midline Chest: symmetric without deformity Cardiac: PMI non-displaced, RRR, normal S1, S2, no gallop or significant murmur Lung: clear to ausculation and percussion Vascular: all pulses full without bruits  Abdominal: nondistended, nontender, good bowel sounds, no HSM, no bruits Extremities: no cyanosis, clubbing or  edema, no sign of DVT, no varicosities  Skin: normal color, no rashes Neuro: alert and oriented x 3, non-focal Pysch: normal affect  EKG Normal sinus rhythm with occasional PVC. She has a right bundle branch block. BMET No results found for this basename: na, k, cl, co2, glucose, bun, creatinine, calcium, gfrnonaa, gfraa    Lipid Panel  No results found for this basename: chol, trig, hdl, cholhdl, vldl, ldlcalc    CBC No results found for this basename: wbc, rbc, hgb, hct, plt, mcv, mch, mchc, rdw, neutrabs, lymphsabs, monoabs, eosabs, basosabs

## 2011-11-06 NOTE — Assessment & Plan Note (Signed)
Not sure what this is but concerned that she is dehydrated, probably somewhat overmedicated with metoprolol, and may have worsening pulmonary hypertension or LV dysfunction. I've asked to stay well-hydrated as above, cut her metoprolol dose down, and will obtain 2-D echocardiogram.

## 2011-11-06 NOTE — Assessment & Plan Note (Signed)
I think at this point her blood pressure may be overtreated and it may be responsible so for some of her symptoms. Her metoprolol back. Please note that she had not taken her medicines this morning and she was not hypertensive. In fact on careful check her blood pressure she was only 123/67. Heart rate was 61 at rest.

## 2011-11-06 NOTE — Assessment & Plan Note (Signed)
That she's not clearly orthostatic in the office, historically she has symptoms very suggestive of this. Please no changes made under fatigue. We'll also obtain blood work including a CBC and a comprehensive metabolic profile.

## 2011-11-06 NOTE — Patient Instructions (Addendum)
Your physician has requested that you have an echocardiogram. Echocardiography is a painless test that uses sound waves to create images of your heart. It provides your doctor with information about the size and shape of your heart and how well your heart's chambers and valves are working. This procedure takes approximately one hour. There are no restrictions for this procedure.  Your physician recommends that you schedule a follow-up appointment in:  October 8,2013 with Dr. Verl Blalock  Your physician has recommended you make the following change in your medication:   Decrease Metoprolol to 25 mg 1 tablet twice a day   Your physician recommends that you have lab work today.

## 2011-11-06 NOTE — Assessment & Plan Note (Signed)
Status post repair. Seems stable by exam.

## 2011-12-03 ENCOUNTER — Other Ambulatory Visit: Payer: Self-pay

## 2011-12-03 ENCOUNTER — Ambulatory Visit (HOSPITAL_COMMUNITY): Payer: Medicare Other | Attending: Cardiology | Admitting: Radiology

## 2011-12-03 DIAGNOSIS — I10 Essential (primary) hypertension: Secondary | ICD-10-CM | POA: Insufficient documentation

## 2011-12-03 DIAGNOSIS — I059 Rheumatic mitral valve disease, unspecified: Secondary | ICD-10-CM | POA: Diagnosis not present

## 2011-12-03 DIAGNOSIS — I951 Orthostatic hypotension: Secondary | ICD-10-CM | POA: Insufficient documentation

## 2011-12-03 DIAGNOSIS — I2789 Other specified pulmonary heart diseases: Secondary | ICD-10-CM

## 2011-12-03 DIAGNOSIS — I369 Nonrheumatic tricuspid valve disorder, unspecified: Secondary | ICD-10-CM | POA: Insufficient documentation

## 2011-12-03 DIAGNOSIS — I080 Rheumatic disorders of both mitral and aortic valves: Secondary | ICD-10-CM | POA: Insufficient documentation

## 2011-12-03 DIAGNOSIS — I340 Nonrheumatic mitral (valve) insufficiency: Secondary | ICD-10-CM

## 2011-12-03 NOTE — Progress Notes (Signed)
Echocardiogram performed.  

## 2011-12-16 ENCOUNTER — Other Ambulatory Visit: Payer: Self-pay

## 2011-12-16 ENCOUNTER — Encounter: Payer: Self-pay | Admitting: Cardiology

## 2011-12-16 ENCOUNTER — Ambulatory Visit (INDEPENDENT_AMBULATORY_CARE_PROVIDER_SITE_OTHER): Payer: Medicare Other | Admitting: Cardiology

## 2011-12-16 VITALS — BP 130/60 | HR 62 | Ht 63.0 in | Wt 117.0 lb

## 2011-12-16 DIAGNOSIS — I059 Rheumatic mitral valve disease, unspecified: Secondary | ICD-10-CM

## 2011-12-16 DIAGNOSIS — K5730 Diverticulosis of large intestine without perforation or abscess without bleeding: Secondary | ICD-10-CM | POA: Diagnosis not present

## 2011-12-16 DIAGNOSIS — I340 Nonrheumatic mitral (valve) insufficiency: Secondary | ICD-10-CM

## 2011-12-16 DIAGNOSIS — I951 Orthostatic hypotension: Secondary | ICD-10-CM

## 2011-12-16 DIAGNOSIS — I10 Essential (primary) hypertension: Secondary | ICD-10-CM | POA: Diagnosis not present

## 2011-12-16 DIAGNOSIS — R5381 Other malaise: Secondary | ICD-10-CM | POA: Diagnosis not present

## 2011-12-16 DIAGNOSIS — R5383 Other fatigue: Secondary | ICD-10-CM | POA: Diagnosis not present

## 2011-12-16 DIAGNOSIS — I2789 Other specified pulmonary heart diseases: Secondary | ICD-10-CM

## 2011-12-16 DIAGNOSIS — I058 Other rheumatic mitral valve diseases: Secondary | ICD-10-CM

## 2011-12-16 MED ORDER — HYDROCHLOROTHIAZIDE 25 MG PO TABS: 25.0000 mg | ORAL_TABLET | Freq: Every day | ORAL | Status: DC

## 2011-12-16 MED ORDER — IRBESARTAN 300 MG PO TABS: 300.0000 mg | ORAL_TABLET | Freq: Every day | ORAL | Status: DC

## 2011-12-16 NOTE — Assessment & Plan Note (Signed)
Good control. No change in medical therapy. 

## 2011-12-16 NOTE — Patient Instructions (Addendum)
Your physician recommends that you continue on your current medications as directed. Please refer to the Current Medication list given to you today.  Your physician wants you to follow-up in: 1 year. You will receive a reminder letter in the mail two months in advance. If you don't receive a letter, please call our office to schedule the follow-up appointment.  

## 2011-12-16 NOTE — Assessment & Plan Note (Signed)
Her fatigue is markedly improved with a reduction in beta blocker. No change in medical therapy.

## 2011-12-16 NOTE — Progress Notes (Signed)
HPI [name] returns today for her history of mitral regurgitation status post mitral valve repair, history of hypertension that has been difficult to control in the past, postoperative pericarditis in A. fib, and fatigue.  Her fatigue is markedly improved with a reduction in her beta blocker. She denies orthopnea, PND palpitations. Her blood pressures been under good control. She's had no chest pain. Today she feels remarkably better since last visit.  Past Medical History  Diagnosis Date  . Atrial fibrillation     postoperative  . MR (mitral regurgitation)     severe  . Pericarditis     postoperative  . HTN (hypertension)   . CVA (cerebral vascular accident)   . Arthritis     Current Outpatient Prescriptions  Medication Sig Dispense Refill  . amoxicillin (AMOXIL) 500 MG capsule Take 500 mg by mouth as needed. Dental use      . Cholecalciferol (VITAMIN D3) 10000 UNITS capsule Take 10,000 Units by mouth daily.        [name] Calcium (STOOL SOFTENER PO) Take by mouth as needed.        Marland Kitchen estradiol (ESTRACE) 0.1 MG/GM vaginal cream Place 2 g vaginally as directed.        Marland Kitchen estradiol (VIVELLE-DOT) 0.025 MG/24HR Place 1 patch onto the skin 2 (two) times a week.        . hydrochlorothiazide (HYDRODIURIL) 25 MG tablet TAKE ONE TABLET BY MOUTH EVERY DAY  30 tablet  4  . irbesartan (AVAPRO) 300 MG tablet TAKE ONE TABLET BY MOUTH EVERY DAY  30 tablet  3  . metoprolol (LOPRESSOR) 50 MG tablet Take 0.5 tablets (25 mg total) by mouth 2 (two) times daily.  60 tablet  9    Allergies  Allergen Reactions  . Aspirin-Caffeine   . Codeine     REACTION: upset stomach  . Sulfonamide Derivatives     REACTION: swollen tongue    Family History  Problem Relation Age of Onset  . Colon cancer Brother   . Heart disease Father   . Heart disease Mother     History   Social History  . Marital Status: Married    Spouse Name: N/A    Number of Children: N/A  . Years of Education: N/A    Occupational History  . Not on file.   Social History Main Topics  . Smoking status: Never Smoker   . Smokeless tobacco: Not on file  . Alcohol Use: No  . Drug Use: No  . Sexually Active: Not on file   Other Topics Concern  . Not on file   Social History Narrative   Married, retired.     ROS ALL NEGATIVE EXCEPT THOSE NOTED IN HPI  PE  General Appearance: well developed, well nourished in no acute distress HEENT: symmetrical face, PERRLA, good dentition  Neck: no JVD, thyromegaly, or adenopathy, trachea midline Chest: symmetric without deformity Cardiac: PMI non-displaced, RRR, normal S1, S2, no gallop or murmur Lung: clear to ausculation and percussion Vascular: all pulses full without bruits  Abdominal: nondistended, nontender, good bowel sounds, no HSM, no bruits Extremities: no cyanosis, clubbing or edema, no sign of DVT, no varicosities  Skin: normal color, no rashes Neuro: alert and oriented x 3, non-focal Pysch: normal affect  EKG  BMET    Component Value Date/Time   NA 138 11/06/2011 1213   K 3.7 11/06/2011 1213   CL 101 11/06/2011 1213   CO2 29 11/06/2011 1213   GLUCOSE 91  11/06/2011 1213   BUN 20 11/06/2011 1213   CREATININE 0.9 11/06/2011 1213   CALCIUM 9.8 11/06/2011 1213    Lipid Panel  No results found for this basename: chol, trig, hdl, cholhdl, vldl, ldlcalc    CBC    Component Value Date/Time   WBC 5.9 11/06/2011 1213   RBC 3.98 11/06/2011 1213   HGB 12.5 11/06/2011 1213   HCT 37.8 11/06/2011 1213   PLT 215.0 11/06/2011 1213   MCV 94.8 11/06/2011 1213   MCHC 33.0 11/06/2011 1213   RDW 13.1 11/06/2011 1213   LYMPHSABS 1.6 11/06/2011 1213   MONOABS 0.4 11/06/2011 1213   EOSABS 0.1 11/06/2011 1213   BASOSABS 0.0 11/06/2011 1213

## 2011-12-16 NOTE — Assessment & Plan Note (Signed)
Remarkably stable historically as well as by exam. Continue current therapy. No indication for echocardiography.

## 2011-12-22 ENCOUNTER — Other Ambulatory Visit: Payer: Self-pay | Admitting: Cardiology

## 2012-01-20 ENCOUNTER — Telehealth: Payer: Self-pay | Admitting: Cardiology

## 2012-01-20 NOTE — Telephone Encounter (Signed)
Pt was here last month two rx's  were called in for  her irbesartan and hydrochlorothiazide needs 30 days supply with refills, [facility] [place]  [phone]

## 2012-01-21 ENCOUNTER — Other Ambulatory Visit: Payer: Self-pay | Admitting: *Deleted

## 2012-03-24 ENCOUNTER — Other Ambulatory Visit: Payer: Self-pay | Admitting: *Deleted

## 2012-03-24 MED ORDER — HYDROCHLOROTHIAZIDE 25 MG PO TABS: 25.0000 mg | ORAL_TABLET | Freq: Every day | ORAL | Status: DC

## 2012-03-24 MED ORDER — IRBESARTAN 300 MG PO TABS: 300.0000 mg | ORAL_TABLET | Freq: Every day | ORAL | Status: DC

## 2012-03-26 DIAGNOSIS — H8309 Labyrinthitis, unspecified ear: Secondary | ICD-10-CM | POA: Diagnosis not present

## 2012-05-04 DIAGNOSIS — Z1231 Encounter for screening mammogram for malignant neoplasm of breast: Secondary | ICD-10-CM | POA: Diagnosis not present

## 2012-07-27 ENCOUNTER — Telehealth: Payer: Self-pay | Admitting: *Deleted

## 2012-07-27 DIAGNOSIS — E860 Dehydration: Secondary | ICD-10-CM | POA: Diagnosis not present

## 2012-07-27 DIAGNOSIS — Z79899 Other long term (current) drug therapy: Secondary | ICD-10-CM | POA: Diagnosis not present

## 2012-07-27 DIAGNOSIS — J449 Chronic obstructive pulmonary disease, unspecified: Secondary | ICD-10-CM | POA: Diagnosis not present

## 2012-07-27 DIAGNOSIS — R1031 Right lower quadrant pain: Secondary | ICD-10-CM | POA: Diagnosis not present

## 2012-07-27 DIAGNOSIS — R6510 Systemic inflammatory response syndrome (SIRS) of non-infectious origin without acute organ dysfunction: Secondary | ICD-10-CM | POA: Diagnosis not present

## 2012-07-27 DIAGNOSIS — K8020 Calculus of gallbladder without cholecystitis without obstruction: Secondary | ICD-10-CM | POA: Diagnosis not present

## 2012-07-27 DIAGNOSIS — R3989 Other symptoms and signs involving the genitourinary system: Secondary | ICD-10-CM | POA: Diagnosis present

## 2012-07-27 DIAGNOSIS — Z882 Allergy status to sulfonamides status: Secondary | ICD-10-CM | POA: Diagnosis not present

## 2012-07-27 DIAGNOSIS — E871 Hypo-osmolality and hyponatremia: Secondary | ICD-10-CM | POA: Diagnosis not present

## 2012-07-27 DIAGNOSIS — R509 Fever, unspecified: Secondary | ICD-10-CM | POA: Diagnosis not present

## 2012-07-27 DIAGNOSIS — J4489 Other specified chronic obstructive pulmonary disease: Secondary | ICD-10-CM | POA: Diagnosis not present

## 2012-07-27 DIAGNOSIS — Z885 Allergy status to narcotic agent status: Secondary | ICD-10-CM | POA: Diagnosis not present

## 2012-07-27 DIAGNOSIS — J159 Unspecified bacterial pneumonia: Secondary | ICD-10-CM | POA: Diagnosis present

## 2012-07-27 DIAGNOSIS — N390 Urinary tract infection, site not specified: Secondary | ICD-10-CM | POA: Diagnosis not present

## 2012-07-27 DIAGNOSIS — J984 Other disorders of lung: Secondary | ICD-10-CM | POA: Diagnosis not present

## 2012-07-27 NOTE — Telephone Encounter (Signed)
Patient presents to [facility] with fever of 100.5, WBC count of 13, and suprapubic tenderness.  Negative U/A and CXR.  Reports fever up to 102 at home.  Dr. Alphonzo Cruise is going to direct her to the ED for IV antibiotics and further evaluation.  She is to f/u with [place].

## 2012-07-28 DIAGNOSIS — N390 Urinary tract infection, site not specified: Secondary | ICD-10-CM | POA: Diagnosis not present

## 2012-07-28 DIAGNOSIS — E860 Dehydration: Secondary | ICD-10-CM | POA: Diagnosis not present

## 2012-07-29 DIAGNOSIS — N390 Urinary tract infection, site not specified: Secondary | ICD-10-CM | POA: Diagnosis not present

## 2012-07-29 DIAGNOSIS — E860 Dehydration: Secondary | ICD-10-CM | POA: Diagnosis not present

## 2012-08-09 ENCOUNTER — Ambulatory Visit (INDEPENDENT_AMBULATORY_CARE_PROVIDER_SITE_OTHER): Payer: Medicare Other | Admitting: General Practice

## 2012-08-09 ENCOUNTER — Encounter: Payer: Self-pay | Admitting: General Practice

## 2012-08-09 VITALS — BP 131/69 | HR 71 | Temp 96.9°F | Ht 62.0 in | Wt 112.0 lb

## 2012-08-09 DIAGNOSIS — Z87440 Personal history of urinary (tract) infections: Secondary | ICD-10-CM | POA: Diagnosis not present

## 2012-08-09 LAB — POCT UA - MICROSCOPIC ONLY

## 2012-08-09 LAB — POCT URINALYSIS DIPSTICK
Bilirubin, UA: NEGATIVE
Glucose, UA: NEGATIVE
Ketones, UA: NEGATIVE
Spec Grav, UA: 1.015
Urobilinogen, UA: NEGATIVE

## 2012-08-09 NOTE — Progress Notes (Signed)
  Subjective:    Patient ID: [name], female    DOB: 03-Nov-1928, 79 y.o.   MRN: [id number]  HPI Presents today for hospital follow up visit. She reports being admitted on 07/27/12 for UTI, she reports having a fever and chills prior to admission. She reports staying in hospital for two days. She reports taking Levofloxacin for 10 days after being released from hospital. Denies having any pain, discomfort, fever, or chills at this time.  Patient reports difficulty sleeping since being out of hospital. Reports sleeping 3-4 hours a night. Denies taking any medications OTC.    Review of Systems  Constitutional: Negative for fever and chills.  HENT: Negative for ear pain and neck pain.   Respiratory: Negative for chest tightness and shortness of breath.   Cardiovascular: Negative for chest pain and palpitations.  Gastrointestinal: Negative for abdominal pain and abdominal distention.  Genitourinary: Negative for dysuria, hematuria, flank pain, vaginal bleeding, difficulty urinating and pelvic pain.  Musculoskeletal: Negative for myalgias and gait problem.  Skin: Negative.   Neurological: Negative for dizziness, syncope, weakness and light-headedness.  Psychiatric/Behavioral: Negative.        Objective:   Physical Exam  Constitutional: She is oriented to person, place, and time. She appears well-developed and well-nourished.  HENT:  Head: Normocephalic and atraumatic.  Right Ear: External ear normal.  Left Ear: External ear normal.  Cardiovascular: Normal rate, regular rhythm and normal heart sounds.   No murmur heard. Pulmonary/Chest: Effort normal and breath sounds normal. No respiratory distress. She exhibits no tenderness.  Abdominal: Soft. Bowel sounds are normal. She exhibits no distension. There is no tenderness.  Neurological: She is alert and oriented to person, place, and time.  Skin: Skin is warm and dry.  Psychiatric: She has a normal mood and affect.   Results for  orders placed in visit on 08/09/12  POCT UA - MICROSCOPIC ONLY      Result Value Range   WBC, Ur, HPF, POC 1.3     RBC, urine, microscopic 1-3     Bacteria, U Microscopic occ     Mucus, UA negative     Epithelial cells, urine per micros occ     Crystals, Ur, HPF, POC negative     Casts, Ur, LPF, POC negative     Yeast, UA negative    POCT URINALYSIS DIPSTICK      Result Value Range   Color, UA gold     Clarity, UA clear     Glucose, UA negative     Bilirubin, UA negative     Ketones, UA negative     Spec Grav, UA 1.015     Blood, UA trace     pH, UA 5.0     Protein, UA negative     Urobilinogen, UA negative     Nitrite, UA negative     Leukocytes, UA              Assessment & Plan:  Resolved UTI -Continue medications as prescribed -discussed fall precautions -RTO if symptoms develop -Patient verbalized understanding -Erby Pian, FNP-C

## 2012-08-09 NOTE — Patient Instructions (Signed)
Urinary Tract Infection Urinary tract infections (UTIs) can develop anywhere along your urinary tract. Your urinary tract is your body's drainage system for removing wastes and extra water. Your urinary tract includes two kidneys, two ureters, a bladder, and a urethra. Your kidneys are a pair of bean-shaped organs. Each kidney is about the size of your fist. They are located below your ribs, one on each side of your spine. CAUSES Infections are caused by microbes, which are microscopic organisms, including fungi, viruses, and bacteria. These organisms are so small that they can only be seen through a microscope. Bacteria are the microbes that most commonly cause UTIs. SYMPTOMS  Symptoms of UTIs may vary by age and gender of the patient and by the location of the infection. Symptoms in young women typically include a frequent and intense urge to urinate and a painful, burning feeling in the bladder or urethra during urination. Older women and men are more likely to be tired, shaky, and weak and have muscle aches and abdominal pain. A fever may mean the infection is in your kidneys. Other symptoms of a kidney infection include pain in your back or sides below the ribs, nausea, and vomiting. DIAGNOSIS To diagnose a UTI, your caregiver will ask you about your symptoms. Your caregiver also will ask to provide a urine sample. The urine sample will be tested for bacteria and white blood cells. White blood cells are made by your body to help fight infection. TREATMENT  Typically, UTIs can be treated with medication. Because most UTIs are caused by a bacterial infection, they usually can be treated with the use of antibiotics. The choice of antibiotic and length of treatment depend on your symptoms and the type of bacteria causing your infection. HOME CARE INSTRUCTIONS  If you were prescribed antibiotics, take them exactly as your caregiver instructs you. Finish the medication even if you feel better after you  have only taken some of the medication.  Drink enough water and fluids to keep your urine clear or pale yellow.  Avoid caffeine, tea, and carbonated beverages. They tend to irritate your bladder.  Empty your bladder often. Avoid holding urine for long periods of time.  Empty your bladder before and after sexual intercourse.  After a bowel movement, women should cleanse from front to back. Use each tissue only once. SEEK MEDICAL CARE IF:   You have back pain.  You develop a fever.  Your symptoms do not begin to resolve within 3 days. SEEK IMMEDIATE MEDICAL CARE IF:   You have severe back pain or lower abdominal pain.  You develop chills.  You have nausea or vomiting.  You have continued burning or discomfort with urination. MAKE SURE YOU:   Understand these instructions.  Will watch your condition.  Will get help right away if you are not doing well or get worse. Document Released: 12/04/2004 Document Revised: 08/26/2011 Document Reviewed: 04/04/2011 [facility] Patient Information 2014 [place]. Insomnia Insomnia is frequent trouble falling and/or staying asleep. Insomnia can be a long term problem or a short term problem. Both are common. Insomnia can be a short term problem when the wakefulness is related to a certain stress or worry. Long term insomnia is often related to ongoing stress during waking hours and/or poor sleeping habits. Overtime, sleep deprivation itself can make the problem worse. Every little thing feels more severe because you are overtired and your ability to cope is decreased. CAUSES   Stress, anxiety, and depression.  Poor sleeping habits.  Distractions such as TV in the bedroom.  Naps close to bedtime.  Engaging in emotionally charged conversations before bed.  Technical reading before sleep.  Alcohol and other sedatives. They may make the problem worse. They can hurt normal sleep patterns and normal dream activity.  Stimulants such  as caffeine for several hours prior to bedtime.  Pain syndromes and shortness of breath can cause insomnia.  Exercise late at night.  Changing time zones may cause sleeping problems (jet lag). It is sometimes helpful to have someone observe your sleeping patterns. They should look for periods of not breathing during the night (sleep apnea). They should also look to see how long those periods last. If you live alone or observers are uncertain, you can also be observed at a sleep clinic where your sleep patterns will be professionally monitored. Sleep apnea requires a checkup and treatment. Give your caregivers your medical history. Give your caregivers observations your family has made about your sleep.  SYMPTOMS   Not feeling rested in the morning.  Anxiety and restlessness at bedtime.  Difficulty falling and staying asleep. TREATMENT   Your caregiver may prescribe treatment for an underlying medical disorders. Your caregiver can give advice or help if you are using alcohol or other drugs for self-medication. Treatment of underlying problems will usually eliminate insomnia problems.  Medications can be prescribed for short time use. They are generally not recommended for lengthy use.  Over-the-counter sleep medicines are not recommended for lengthy use. They can be habit forming.  You can promote easier sleeping by making lifestyle changes such as:  Using relaxation techniques that help with breathing and reduce muscle tension.  Exercising earlier in the day.  Changing your diet and the time of your last meal. No night time snacks.  Establish a regular time to go to bed.  Counseling can help with stressful problems and worry.  Soothing music and white noise may be helpful if there are background noises you cannot remove.  Stop tedious detailed work at least one hour before bedtime. HOME CARE INSTRUCTIONS   Keep a diary. Inform your caregiver about your progress. This includes  any medication side effects. See your caregiver regularly. Take note of:  Times when you are asleep.  Times when you are awake during the night.  The quality of your sleep.  How you feel the next day. This information will help your caregiver care for you.  Get out of bed if you are still awake after 15 minutes. Read or do some quiet activity. Keep the lights down. Wait until you feel sleepy and go back to bed.  Keep regular sleeping and waking hours. Avoid naps.  Exercise regularly.  Avoid distractions at bedtime. Distractions include watching television or engaging in any intense or detailed activity like attempting to balance the household checkbook.  Develop a bedtime ritual. Keep a familiar routine of bathing, brushing your teeth, climbing into bed at the same time each night, listening to soothing music. Routines increase the success of falling to sleep faster.  Use relaxation techniques. This can be using breathing and muscle tension release routines. It can also include visualizing peaceful scenes. You can also help control troubling or intruding thoughts by keeping your mind occupied with boring or repetitive thoughts like the old concept of counting sheep. You can make it more creative like imagining planting one beautiful flower after another in your backyard garden.  During your day, work to eliminate stress. When this is not possible use  some of the previous suggestions to help reduce the anxiety that accompanies stressful situations. MAKE SURE YOU:   Understand these instructions.  Will watch your condition.  Will get help right away if you are not doing well or get worse. Document Released: 02/22/2000 Document Revised: 05/19/2011 Document Reviewed: 03/24/2007 [facility] Patient Information 2014 [place].

## 2012-08-10 ENCOUNTER — Telehealth: Payer: Self-pay | Admitting: General Practice

## 2012-08-10 NOTE — Telephone Encounter (Signed)
Spoke with patient.

## 2012-09-13 DIAGNOSIS — Z1212 Encounter for screening for malignant neoplasm of rectum: Secondary | ICD-10-CM | POA: Diagnosis not present

## 2012-09-13 DIAGNOSIS — Z130 Encounter for screening for diseases of the blood and blood-forming organs and certain disorders involving the immune mechanism: Secondary | ICD-10-CM | POA: Diagnosis not present

## 2012-09-13 DIAGNOSIS — Z124 Encounter for screening for malignant neoplasm of cervix: Secondary | ICD-10-CM | POA: Diagnosis not present

## 2012-10-08 ENCOUNTER — Ambulatory Visit (INDEPENDENT_AMBULATORY_CARE_PROVIDER_SITE_OTHER): Payer: Medicare Other | Admitting: Cardiovascular Disease

## 2012-10-08 ENCOUNTER — Encounter: Payer: Self-pay | Admitting: Cardiovascular Disease

## 2012-10-08 VITALS — BP 124/64 | HR 64 | Ht 62.0 in | Wt 114.0 lb

## 2012-10-08 DIAGNOSIS — I10 Essential (primary) hypertension: Secondary | ICD-10-CM | POA: Diagnosis not present

## 2012-10-08 DIAGNOSIS — Z8249 Family history of ischemic heart disease and other diseases of the circulatory system: Secondary | ICD-10-CM | POA: Diagnosis not present

## 2012-10-08 DIAGNOSIS — I059 Rheumatic mitral valve disease, unspecified: Secondary | ICD-10-CM

## 2012-10-08 MED ORDER — IRBESARTAN 300 MG PO TABS: 300.0000 mg | ORAL_TABLET | Freq: Every day | ORAL | Status: DC

## 2012-10-08 NOTE — Assessment & Plan Note (Signed)
S/P repair by [facility] in [place] No residual MR and normal EF  SBE prophylaxis Stable

## 2012-10-08 NOTE — Patient Instructions (Addendum)
Your physician wants you to follow-up in: YEAR WITH DR NISHAN  You will receive a reminder letter in the mail two months in advance. If you don't receive a letter, please call our office to schedule the follow-up appointment.  Your physician recommends that you continue on your current medications as directed. Please refer to the Current Medication list given to you today. 

## 2012-10-08 NOTE — Assessment & Plan Note (Signed)
Well controlled.  Continue current medications and low sodium Dash type diet.    

## 2012-10-08 NOTE — Progress Notes (Signed)
Patient ID: [name], female   DOB: [birth year], 79 y.o.   MRN: [id number] 79 yo patient of Dr Verl Blalock  History of MV surgery PAF and HTN.  Had fatigue in past with higher dose beta blocker Also history of HTN Echo 12/03/11  Looked good  I take care of her husband who has had an AVR  Study Conclusions  - Left ventricle: The estimated ejection fraction was 60%. Wall motion was normal; there were no regional wall motion abnormalities. Doppler parameters are consistent with high ventricular filling pressure. - Aortic valve: The valve appears to be grossly normal. Mild regurgitation. - Mitral valve: Mitral valve repair is working well. Valve area by pressure half-time: 2.27cm^2. - Left atrium: The atrium was mildly dilated. - Right atrium: The atrium was mildly dilated. - Pulmonary arteries: PA peak pressure: 80mm Hg (S).   ROS: Denies fever, malais, weight loss, blurry vision, decreased visual acuity, cough, sputum, SOB, hemoptysis, pleuritic pain, palpitaitons, heartburn, abdominal pain, melena, lower extremity edema, claudication, or rash.  All other systems reviewed and negative  General: Affect appropriate Healthy:  appears stated age 79: normal Neck supple with no adenopathy JVP normal no bruits no thyromegaly Lungs clear with no wheezing and good diaphragmatic motion Heart:  S1/S2 no murmur, no rub, gallop or click PMI normal Abdomen: benighn, BS positve, no tenderness, no AAA no bruit.  No HSM or HJR Distal pulses intact with no bruits No edema Neuro non-focal Skin warm and dry No muscular weakness Arthritic changes in hands   Current Outpatient Prescriptions  Medication Sig Dispense Refill  . Cholecalciferol (VITAMIN D3) 10000 UNITS capsule Take 10,000 Units by mouth daily.        Mariane Baumgarten Calcium (STOOL SOFTENER PO) Take by mouth as needed.        Marland Kitchen estradiol (ESTRACE) 0.1 MG/GM vaginal cream Place 2 g vaginally as directed.        Marland Kitchen estradiol (VIVELLE-DOT) 0.025  MG/24HR Place 1 patch onto the skin 2 (two) times a week.        . hydrochlorothiazide (HYDRODIURIL) 25 MG tablet Take 1 tablet (25 mg total) by mouth daily.  30 tablet  11  . irbesartan (AVAPRO) 300 MG tablet Take 1 tablet (300 mg total) by mouth daily.  30 tablet  6  . metoprolol (LOPRESSOR) 50 MG tablet Take 0.5 tablets (25 mg total) by mouth 2 (two) times daily.  60 tablet  9   No current facility-administered medications for this visit.    Allergies  Aspirin-caffeine; Codeine; and Sulfonamide derivatives  Electrocardiogram:  Assessment and Plan

## 2012-10-25 ENCOUNTER — Other Ambulatory Visit: Payer: Self-pay | Admitting: Cardiology

## 2012-11-15 ENCOUNTER — Other Ambulatory Visit: Payer: Self-pay | Admitting: Cardiology

## 2012-12-22 ENCOUNTER — Other Ambulatory Visit: Payer: Self-pay | Admitting: Cardiology

## 2012-12-27 ENCOUNTER — Ambulatory Visit (INDEPENDENT_AMBULATORY_CARE_PROVIDER_SITE_OTHER): Payer: Medicare Other

## 2012-12-27 DIAGNOSIS — Z23 Encounter for immunization: Secondary | ICD-10-CM

## 2012-12-28 ENCOUNTER — Other Ambulatory Visit: Payer: Self-pay | Admitting: *Deleted

## 2012-12-28 MED ORDER — IRBESARTAN 300 MG PO TABS: 300.0000 mg | ORAL_TABLET | Freq: Every day | ORAL | Status: DC

## 2013-02-10 ENCOUNTER — Other Ambulatory Visit: Payer: Self-pay | Admitting: Cardiovascular Disease

## 2013-05-11 DIAGNOSIS — Z1231 Encounter for screening mammogram for malignant neoplasm of breast: Secondary | ICD-10-CM | POA: Diagnosis not present

## 2013-06-15 ENCOUNTER — Other Ambulatory Visit: Payer: Self-pay | Admitting: Dermatology

## 2013-06-15 DIAGNOSIS — L820 Inflamed seborrheic keratosis: Secondary | ICD-10-CM | POA: Diagnosis not present

## 2013-06-15 DIAGNOSIS — D485 Neoplasm of uncertain behavior of skin: Secondary | ICD-10-CM | POA: Diagnosis not present

## 2013-08-02 ENCOUNTER — Other Ambulatory Visit: Payer: Self-pay | Admitting: Cardiovascular Disease

## 2013-08-15 ENCOUNTER — Other Ambulatory Visit: Payer: Self-pay | Admitting: Cardiovascular Disease

## 2013-08-22 ENCOUNTER — Ambulatory Visit (INDEPENDENT_AMBULATORY_CARE_PROVIDER_SITE_OTHER): Payer: Medicare Other

## 2013-08-22 ENCOUNTER — Ambulatory Visit (INDEPENDENT_AMBULATORY_CARE_PROVIDER_SITE_OTHER): Payer: Medicare Other | Admitting: Family

## 2013-08-22 ENCOUNTER — Encounter: Payer: Self-pay | Admitting: Family

## 2013-08-22 ENCOUNTER — Telehealth: Payer: Self-pay | Admitting: Nurse Practitioner

## 2013-08-22 VITALS — BP 101/56 | HR 71 | Temp 100.3°F | Wt 112.8 lb

## 2013-08-22 DIAGNOSIS — R509 Fever, unspecified: Secondary | ICD-10-CM

## 2013-08-22 DIAGNOSIS — R059 Cough, unspecified: Secondary | ICD-10-CM

## 2013-08-22 DIAGNOSIS — J069 Acute upper respiratory infection, unspecified: Secondary | ICD-10-CM | POA: Diagnosis not present

## 2013-08-22 DIAGNOSIS — R05 Cough: Secondary | ICD-10-CM

## 2013-08-22 MED ORDER — BENZONATATE 100 MG PO CAPS: 100.0000 mg | ORAL_CAPSULE | Freq: Three times a day (TID) | ORAL | Status: DC | PRN

## 2013-08-22 MED ORDER — AMOXICILLIN 500 MG PO CAPS: 500.0000 mg | ORAL_CAPSULE | Freq: Two times a day (BID) | ORAL | Status: DC

## 2013-08-22 NOTE — Patient Instructions (Addendum)
Upper Respiratory Infection, Adult An upper respiratory infection (URI) is also sometimes known as the common cold. The upper respiratory tract includes the nose, sinuses, throat, trachea, and bronchi. Bronchi are the airways leading to the lungs. Most people improve within 1 week, but symptoms can last up to 2 weeks. A residual cough may last even longer.  CAUSES Many different viruses can infect the tissues lining the upper respiratory tract. The tissues become irritated and inflamed and often become very moist. Mucus production is also common. A cold is contagious. You can easily spread the virus to others by oral contact. This includes kissing, sharing a glass, coughing, or sneezing. Touching your mouth or nose and then touching a surface, which is then touched by another person, can also spread the virus. SYMPTOMS  Symptoms typically develop 1 to 3 days after you come in contact with a cold virus. Symptoms vary from person to person. They may include:  Runny nose.  Sneezing.  Nasal congestion.  Sinus irritation.  Sore throat.  Loss of voice (laryngitis).  Cough.  Fatigue.  Muscle aches.  Loss of appetite.  Headache.  Low-grade fever. DIAGNOSIS  You might diagnose your own cold based on familiar symptoms, since most people get a cold 2 to 3 times a year. Your caregiver can confirm this based on your exam. Most importantly, your caregiver can check that your symptoms are not due to another disease such as strep throat, sinusitis, pneumonia, asthma, or epiglottitis. Blood tests, throat tests, and X-rays are not necessary to diagnose a common cold, but they may sometimes be helpful in excluding other more serious diseases. Your caregiver will decide if any further tests are required. RISKS AND COMPLICATIONS  You may be at risk for a more severe case of the common cold if you smoke cigarettes, have chronic heart disease (such as heart failure) or lung disease (such as asthma), or if  you have a weakened immune system. The very young and very old are also at risk for more serious infections. Bacterial sinusitis, middle ear infections, and bacterial pneumonia can complicate the common cold. The common cold can worsen asthma and chronic obstructive pulmonary disease (COPD). Sometimes, these complications can require emergency medical care and may be life-threatening. PREVENTION  The best way to protect against getting a cold is to practice good hygiene. Avoid oral or hand contact with people with cold symptoms. Wash your hands often if contact occurs. There is no clear evidence that vitamin C, vitamin E, echinacea, or exercise reduces the chance of developing a cold. However, it is always recommended to get plenty of rest and practice good nutrition. TREATMENT  Treatment is directed at relieving symptoms. There is no cure. Antibiotics are not effective, because the infection is caused by a virus, not by bacteria. Treatment may include:  Increased fluid intake. Sports drinks offer valuable electrolytes, sugars, and fluids.  Breathing heated mist or steam (vaporizer or shower).  Eating chicken soup or other clear broths, and maintaining good nutrition.  Getting plenty of rest.  Using gargles or lozenges for comfort.  Controlling fevers with ibuprofen or acetaminophen as directed by your caregiver.  Increasing usage of your inhaler if you have asthma. Zinc gel and zinc lozenges, taken in the first 24 hours of the common cold, can shorten the duration and lessen the severity of symptoms. Pain medicines may help with fever, muscle aches, and throat pain. A variety of non-prescription medicines are available to treat congestion and runny nose. Your caregiver   can make recommendations and may suggest nasal or lung inhalers for other symptoms.  HOME CARE INSTRUCTIONS   Only take over-the-counter or prescription medicines for pain, discomfort, or fever as directed by your  caregiver.  Use a warm mist humidifier or inhale steam from a shower to increase air moisture. This may keep secretions moist and make it easier to breathe.  Drink enough water and fluids to keep your urine clear or pale yellow.  Rest as needed.  Return to work when your temperature has returned to normal or as your caregiver advises. You may need to stay home longer to avoid infecting others. You can also use a face mask and careful hand washing to prevent spread of the virus. SEEK MEDICAL CARE IF:   After the first few days, you feel you are getting worse rather than better.  You need your caregiver's advice about medicines to control symptoms.  You develop chills, worsening shortness of breath, or brown or red sputum. These may be signs of pneumonia.  You develop yellow or brown nasal discharge or pain in the face, especially when you bend forward. These may be signs of sinusitis.  You develop a fever, swollen neck glands, pain with swallowing, or white areas in the back of your throat. These may be signs of strep throat. SEEK IMMEDIATE MEDICAL CARE IF:   You have a fever.  You develop severe or persistent headache, ear pain, sinus pain, or chest pain.  You develop wheezing, a prolonged cough, cough up blood, or have a change in your usual mucus (if you have chronic lung disease).  You develop sore muscles or a stiff neck. Document Released: 08/20/2000 Document Revised: 05/19/2011 Document Reviewed: 06/28/2010 [employer] Patient Information 2014 [place], [place].  - Take meds as prescribed - Use a cool mist humidifier  -Use saline nose sprays frequently -Saline irrigations of the nose can be very helpful if done frequently.  * 4X daily for 1 week*  * Use of a nettie pot can be helpful with this. Follow directions with this* -Force fluids -For any cough or congestion  Use plain Mucinex- regular strength or max strength is fine   * Children- consult with Pharmacist for  dosing -For fever or aces or pains- take tylenol or ibuprofen appropriate for age and weight.  * for fevers greater than 101 orally you may alternate ibuprofen and tylenol every  3 hours. -Throat lozenges if help -New toothbrush in 3 days   Evelina Dun, FNP

## 2013-08-22 NOTE — Progress Notes (Signed)
   Subjective:    Patient ID: [name], female    DOB: 03/26/1928, 80 y.o.   MRN: [id number]  Cough This is a new problem. The current episode started in the past 7 days (Thursday). The problem has been unchanged. The problem occurs every few minutes. The cough is non-productive. Associated symptoms include a fever, rhinorrhea and a sore throat. Pertinent negatives include no ear congestion, ear pain, nasal congestion, postnasal drip, shortness of breath or wheezing. The symptoms are aggravated by lying down. She has tried prescription cough suppressant for the symptoms. The treatment provided mild relief. There is no history of asthma or COPD.      Review of Systems  Constitutional: Positive for fever.  HENT: Positive for rhinorrhea and sore throat. Negative for ear pain and postnasal drip.   Respiratory: Positive for cough. Negative for shortness of breath and wheezing.   All other systems reviewed and are negative.      Objective:   Physical Exam  Vitals reviewed. Constitutional: She is oriented to person, place, and time. She appears well-developed and well-nourished. No distress.  HENT:  Head: Normocephalic and atraumatic.  Right Ear: External ear normal.  Mouth/Throat: Oropharynx is clear and moist.  Bilateral nasal passage erythemas with mild swelling   Eyes: Pupils are equal, round, and reactive to light.  Neck: Normal range of motion. Neck supple. No thyromegaly present.  Cardiovascular: Normal rate, regular rhythm, normal heart sounds and intact distal pulses.   No murmur heard. Pulmonary/Chest: Effort normal and breath sounds normal. No respiratory distress. She has no wheezes.  Diminished breath sounds bilateral   Abdominal: Soft. Bowel sounds are normal. She exhibits no distension. There is no tenderness.  Musculoskeletal: Normal range of motion. She exhibits no edema and no tenderness.  Neurological: She is alert and oriented to person, place, and time.  Skin:  Skin is warm and dry.  Psychiatric: She has a normal mood and affect. Her behavior is normal. Judgment and thought content normal.    BP 101/56  Pulse 71  Temp(Src) 100.3 F (37.9 C) (Oral)  Wt 112 lb 12.8 oz (51.166 kg)  X-ray-WNL. Preliminary reading by Evelina Dun, FNP [facility]      Assessment & Plan:  1. Cough - DG Chest 2 View; Future - benzonatate (TESSALON) 100 MG capsule; Take 1 capsule (100 mg total) by mouth 3 (three) times daily as needed for cough.  Dispense: 30 capsule; Refill: 0  2. Fever, unspecified - DG Chest 2 View; Future  3. Upper respiratory infection -- Take meds as prescribed - Use a cool mist humidifier  -Use saline nose sprays frequently -Saline irrigations of the nose can be very helpful if done frequently.  * 4X daily for 1 week*  * Use of a nettie pot can be helpful with this. Follow directions with this* -Force fluids -For any cough or congestion  Use plain Mucinex- regular strength or max strength is fine   * Children- consult with Pharmacist for dosing -For fever or aces or pains- take tylenol or ibuprofen appropriate for age and weight.  * for fevers greater than 101 orally you may alternate ibuprofen and tylenol every  3 hours. -Throat lozenges if help - amoxicillin (AMOXIL) 500 MG capsule; Take 1 capsule (500 mg total) by mouth 2 (two) times daily.  Dispense: 20 capsule; Refill: 0 RTO prn

## 2013-09-08 ENCOUNTER — Encounter: Payer: Self-pay | Admitting: Family Medicine

## 2013-09-08 ENCOUNTER — Ambulatory Visit (INDEPENDENT_AMBULATORY_CARE_PROVIDER_SITE_OTHER): Payer: Medicare Other | Admitting: Family Medicine

## 2013-09-08 VITALS — BP 132/71 | HR 81 | Temp 100.3°F | Ht 62.0 in | Wt 113.0 lb

## 2013-09-08 DIAGNOSIS — R05 Cough: Secondary | ICD-10-CM

## 2013-09-08 DIAGNOSIS — R059 Cough, unspecified: Secondary | ICD-10-CM | POA: Diagnosis not present

## 2013-09-08 DIAGNOSIS — J209 Acute bronchitis, unspecified: Secondary | ICD-10-CM

## 2013-09-08 MED ORDER — AZITHROMYCIN 250 MG PO TABS: ORAL_TABLET | ORAL | Status: DC

## 2013-09-08 MED ORDER — METHYLPREDNISOLONE ACETATE 80 MG/ML IJ SUSP
80.0000 mg | Freq: Once | INTRAMUSCULAR | Status: AC
  Administered 2013-09-08: 80 mg via INTRAMUSCULAR

## 2013-09-08 MED ORDER — LEVALBUTEROL HCL 1.25 MG/0.5ML IN NEBU: 1.2500 mg | INHALATION_SOLUTION | Freq: Once | RESPIRATORY_TRACT | Status: DC

## 2013-09-08 MED ORDER — BENZONATATE 100 MG PO CAPS: 200.0000 mg | ORAL_CAPSULE | Freq: Three times a day (TID) | ORAL | Status: DC | PRN

## 2013-09-08 MED ORDER — LEVALBUTEROL HCL 1.25 MG/3ML IN NEBU
1.2500 mg | INHALATION_SOLUTION | Freq: Once | RESPIRATORY_TRACT | Status: AC
  Administered 2013-09-08: 1.25 mg via RESPIRATORY_TRACT

## 2013-09-08 MED ORDER — LEVALBUTEROL HCL 1.25 MG/3ML IN NEBU: 1.2500 mg | INHALATION_SOLUTION | RESPIRATORY_TRACT | Status: DC | PRN

## 2013-09-12 NOTE — Progress Notes (Signed)
   Subjective:    Patient ID: [name], female    DOB: 04-Oct-1928, 80 y.o.   MRN: [id number]  HPI  This 80 y.o. female presents for evaluation of persistent cough and uri sx's for over a week. She was seen a week ago and she states the cough has not quit.  She has been having more night time sx's.  Review of Systems C/o cough and uri sx's   No chest pain, SOB, HA, dizziness, vision change, N/V, diarrhea, constipation, dysuria, urinary urgency or frequency, myalgias, arthralgias or rash.  Objective:   Physical Exam   Vital signs noted  Well developed well nourished female.  HEENT - Head atraumatic Normocephalic                Eyes - PERRLA, Conjuctiva - clear Sclera- Clear EOMI                Ears - EAC's Wnl TM's Wnl Gross Hearing WNL                Throat - oropharanx wnl Respiratory - Lungs diminished throughout. Cardiac - RRR S1 and S2 without murmur GI - Abdomen soft Nontender and bowel sounds active x 4    Post neb - Breath sounds CTA and better air movement through out. Assessment & Plan:  Cough - Plan: azithromycin (ZITHROMAX) 250 MG tablet, methylPREDNISolone acetate (DEPO-MEDROL) injection 80 mg, benzonatate (TESSALON) 100 MG capsule, levalbuterol (XOPENEX) nebulizer solution 1.25 mg, levalbuterol (XOPENEX) nebulizer solution 1.25 mg  Acute bronchitis, unspecified organism - Plan: azithromycin (ZITHROMAX) 250 MG tablet, methylPREDNISolone acetate (DEPO-MEDROL) injection 80 mg, benzonatate (TESSALON) 100 MG capsule, levalbuterol (XOPENEX) nebulizer solution 1.25 mg, levalbuterol (XOPENEX) nebulizer solution 1.25 mg  Push po fluids, rest, tylenol and motrin otc prn as directed for fever, arthralgias, and myalgias.  Follow up prn if sx's continue or persist.  Lysbeth Penner FNP

## 2013-09-19 DIAGNOSIS — Z124 Encounter for screening for malignant neoplasm of cervix: Secondary | ICD-10-CM | POA: Diagnosis not present

## 2013-09-19 DIAGNOSIS — Z9189 Other specified personal risk factors, not elsewhere classified: Secondary | ICD-10-CM | POA: Diagnosis not present

## 2013-10-12 ENCOUNTER — Ambulatory Visit: Payer: Medicare Other | Admitting: Cardiovascular Disease

## 2013-10-12 ENCOUNTER — Ambulatory Visit (INDEPENDENT_AMBULATORY_CARE_PROVIDER_SITE_OTHER): Payer: Medicare Other | Admitting: Cardiovascular Disease

## 2013-10-12 ENCOUNTER — Encounter: Payer: Self-pay | Admitting: Cardiovascular Disease

## 2013-10-12 VITALS — BP 102/64 | HR 57 | Ht 62.0 in | Wt 109.4 lb

## 2013-10-12 DIAGNOSIS — I8390 Asymptomatic varicose veins of unspecified lower extremity: Secondary | ICD-10-CM | POA: Diagnosis not present

## 2013-10-12 DIAGNOSIS — I4510 Unspecified right bundle-branch block: Secondary | ICD-10-CM | POA: Diagnosis not present

## 2013-10-12 DIAGNOSIS — I10 Essential (primary) hypertension: Secondary | ICD-10-CM | POA: Diagnosis not present

## 2013-10-12 DIAGNOSIS — I059 Rheumatic mitral valve disease, unspecified: Secondary | ICD-10-CM | POA: Diagnosis not present

## 2013-10-12 DIAGNOSIS — I058 Other rheumatic mitral valve diseases: Secondary | ICD-10-CM

## 2013-10-12 DIAGNOSIS — I340 Nonrheumatic mitral (valve) insufficiency: Secondary | ICD-10-CM

## 2013-10-12 MED ORDER — METOPROLOL TARTRATE 50 MG PO TABS: 50.0000 mg | ORAL_TABLET | Freq: Two times a day (BID) | ORAL | Status: DC

## 2013-10-12 MED ORDER — IRBESARTAN 300 MG PO TABS: 300.0000 mg | ORAL_TABLET | Freq: Every day | ORAL | Status: DC

## 2013-10-12 NOTE — Addendum Note (Signed)
Addended by: Devra Dopp E on: 10/12/2013 03:02 PM   Modules accepted: Orders

## 2013-10-12 NOTE — Assessment & Plan Note (Signed)
Well controlled.  Continue current medications and low sodium Dash type diet.   Continue ARB

## 2013-10-12 NOTE — Assessment & Plan Note (Signed)
Post MV repair  No murmur normal EF  SBE prophylaxis

## 2013-10-12 NOTE — Progress Notes (Signed)
Patient ID: [name], female   DOB: [birth year], 80 y.o.   MRN: [id number] 80 yo patient of Dr Verl Blalock History of MV surgery PAF and HTN. Had fatigue in past with higher dose beta blocker Also history of HTN  Echo 12/03/11 Looked good I take care of her husband who has had an AVR  Study Conclusions  - Left ventricle: The estimated ejection fraction was 60%. Wall motion was normal; there were no regional wall motion abnormalities. Doppler parameters are consistent with high ventricular filling pressure. - Aortic valve: The valve appears to be grossly normal. Mild regurgitation. - Mitral valve: Mitral valve repair is working well. Valve area by pressure half-time: 2.27cm^2. - Left atrium: The atrium was mildly dilated. - Right atrium: The atrium was mildly dilated. - Pulmonary arteries: PA peak pressure: 82mm Hg (S).  Still driving Needs refill on BP meds  Has some varicose veins that look bad but don't hurt and no edema     ROS: Denies fever, malais, weight loss, blurry vision, decreased visual acuity, cough, sputum, SOB, hemoptysis, pleuritic pain, palpitaitons, heartburn, abdominal pain, melena, lower extremity edema, claudication, or rash.  All other systems reviewed and negative  General: Affect appropriate Healthy:  appears stated age 12: normal Neck supple with no adenopathy JVP normal no bruits no thyromegaly Lungs clear with no wheezing and good diaphragmatic motion Heart:  S1/S2 no murmur, no rub, gallop or click PMI normal Abdomen: benighn, BS positve, no tenderness, no AAA no bruit.  No HSM or HJR Distal pulses intact with no bruits No edema  Varicose veins RLE greater than left  Neuro non-focal Skin warm and dry No muscular weakness   Current Outpatient Prescriptions  Medication Sig Dispense Refill  . Cholecalciferol (VITAMIN D3) 10000 UNITS capsule Take 10,000 Units by mouth daily.        Marland Kitchen estradiol (ESTRACE) 0.1 MG/GM vaginal cream Place 2 g vaginally as  directed.        Marland Kitchen estradiol (VIVELLE-DOT) 0.025 MG/24HR Place 1 patch onto the skin 2 (two) times a week.        . hydrochlorothiazide (HYDRODIURIL) 25 MG tablet Take 1 tablet (25 mg total) by mouth daily.  30 tablet  11  . irbesartan (AVAPRO) 300 MG tablet TAKE ONE TABLET BY MOUTH ONCE DAILY  30 tablet  3  . metoprolol (LOPRESSOR) 50 MG tablet TAKE ONE TABLET BY MOUTH TWICE DAILY  60 tablet  11   Current Facility-Administered Medications  Medication Dose Route Frequency Provider Last Rate Last Dose  . levalbuterol (XOPENEX) nebulizer solution 1.25 mg  1.25 mg Nebulization Once Lysbeth Penner, FNP        Allergies  Aspirin-caffeine; Codeine; and Sulfonamide derivatives  Electrocardiogram:  SR rate 57 RBBB no change from 2014   Assessment and Plan

## 2013-10-12 NOTE — Assessment & Plan Note (Signed)
Stable no high grade AV block  F/u ECG yearly

## 2013-10-12 NOTE — Patient Instructions (Signed)
Your physician wants you to follow-up in:   [name] will receive a reminder letter in the mail two months in advance. If you don't receive a letter, please call our office to schedule the follow-up appointment.  Your physician recommends that you continue on your current medications as directed. Please refer to the Current Medication list given to you today.

## 2013-10-12 NOTE — Assessment & Plan Note (Signed)
No edema not painful given age did not recommend f/u or scleroRx

## 2013-10-17 ENCOUNTER — Other Ambulatory Visit: Payer: Self-pay | Admitting: Cardiovascular Disease

## 2013-11-16 ENCOUNTER — Other Ambulatory Visit: Payer: Self-pay | Admitting: Cardiovascular Disease

## 2013-11-30 ENCOUNTER — Encounter: Payer: Self-pay | Admitting: Internal Medicine

## 2013-12-06 ENCOUNTER — Ambulatory Visit (INDEPENDENT_AMBULATORY_CARE_PROVIDER_SITE_OTHER): Payer: Medicare Other

## 2013-12-06 ENCOUNTER — Other Ambulatory Visit (INDEPENDENT_AMBULATORY_CARE_PROVIDER_SITE_OTHER): Payer: Medicare Other | Admitting: *Deleted

## 2013-12-06 DIAGNOSIS — Z23 Encounter for immunization: Secondary | ICD-10-CM | POA: Diagnosis not present

## 2014-03-17 ENCOUNTER — Ambulatory Visit (INDEPENDENT_AMBULATORY_CARE_PROVIDER_SITE_OTHER): Payer: Medicare Other | Admitting: Family Medicine

## 2014-03-17 ENCOUNTER — Encounter: Payer: Self-pay | Admitting: Family Medicine

## 2014-03-17 ENCOUNTER — Other Ambulatory Visit: Payer: Self-pay | Admitting: Cardiovascular Disease

## 2014-03-17 VITALS — BP 140/72 | HR 80 | Temp 98.0°F | Ht 62.0 in | Wt 113.0 lb

## 2014-03-17 DIAGNOSIS — J206 Acute bronchitis due to rhinovirus: Secondary | ICD-10-CM | POA: Diagnosis not present

## 2014-03-17 MED ORDER — LEVALBUTEROL HCL 1.25 MG/0.5ML IN NEBU
1.2500 mg | INHALATION_SOLUTION | Freq: Once | RESPIRATORY_TRACT | Status: AC
  Administered 2014-03-17: 1.25 mg via RESPIRATORY_TRACT

## 2014-03-17 MED ORDER — BENZONATATE 100 MG PO CAPS: 100.0000 mg | ORAL_CAPSULE | Freq: Three times a day (TID) | ORAL | Status: DC | PRN

## 2014-03-17 MED ORDER — METHYLPREDNISOLONE ACETATE 40 MG/ML IJ SUSP
40.0000 mg | Freq: Once | INTRAMUSCULAR | Status: AC
  Administered 2014-03-17: 40 mg via INTRAMUSCULAR

## 2014-03-17 MED ORDER — AZITHROMYCIN 250 MG PO TABS: ORAL_TABLET | ORAL | Status: DC

## 2014-03-17 NOTE — Progress Notes (Signed)
   Subjective:    Patient ID: [name], female    DOB: [birth year], 81 y.o.   MRN: [id number]  HPI Patient c/o cough and uri sx's.   Review of Systems  Constitutional: Negative for fever.  HENT: Negative for ear pain.   Eyes: Negative for discharge.  Respiratory: Negative for cough.   Cardiovascular: Negative for chest pain.  Gastrointestinal: Negative for abdominal distention.  Endocrine: Negative for polyuria.  Genitourinary: Negative for difficulty urinating.  Musculoskeletal: Negative for gait problem and neck pain.  Skin: Negative for color change and rash.  Neurological: Negative for speech difficulty and headaches.  Psychiatric/Behavioral: Negative for agitation.       Objective:    BP 140/72 mmHg  Pulse 80  Temp(Src) 98 F (36.7 C) (Oral)  Ht 5\' 2"  (1.575 m)  Wt 113 lb (51.256 kg)  BMI 20.66 kg/m2 Physical Exam  Constitutional: She is oriented to person, place, and time. She appears well-developed and well-nourished.  HENT:  Head: Normocephalic and atraumatic.  Mouth/Throat: Oropharynx is clear and moist.  Eyes: Pupils are equal, round, and reactive to light.  Neck: Normal range of motion. Neck supple.  Cardiovascular: Normal rate and regular rhythm.   No murmur heard. Pulmonary/Chest: Effort normal and breath sounds normal.  Abdominal: Soft. Bowel sounds are normal. There is no tenderness.  Neurological: She is alert and oriented to person, place, and time.  Skin: Skin is warm and dry.  Psychiatric: She has a normal mood and affect.          Assessment & Plan:     ICD-9-CM ICD-10-CM   1. Acute bronchitis due to Rhinovirus 466.0 J20.6 azithromycin (ZITHROMAX) 250 MG tablet   079.3  benzonatate (TESSALON PERLES) 100 MG capsule     methylPREDNISolone acetate (DEPO-MEDROL) injection 40 mg     levalbuterol (XOPENEX) nebulizer solution 1.25 mg     No Follow-up on file.  Lysbeth Penner FNP

## 2014-03-20 ENCOUNTER — Other Ambulatory Visit (INDEPENDENT_AMBULATORY_CARE_PROVIDER_SITE_OTHER): Payer: Medicare Other

## 2014-03-20 DIAGNOSIS — I10 Essential (primary) hypertension: Secondary | ICD-10-CM | POA: Diagnosis not present

## 2014-03-20 DIAGNOSIS — R5383 Other fatigue: Secondary | ICD-10-CM | POA: Diagnosis not present

## 2014-03-20 NOTE — Progress Notes (Signed)
Lab only 

## 2014-03-21 LAB — LIPID PANEL
Chol/HDL Ratio: 3.4 ratio units (ref 0.0–4.4)
Cholesterol, Total: 204 mg/dL — ABNORMAL HIGH (ref 100–199)
HDL: 60 mg/dL (ref >39)
LDL Calculated: 124 mg/dL — ABNORMAL HIGH (ref 0–99)
Triglycerides: 102 mg/dL (ref 0–149)
VLDL Cholesterol Cal: 20 mg/dL (ref 5–40)

## 2014-03-21 LAB — CMP14+EGFR
ALT: 10 IU/L (ref 0–32)
AST: 12 IU/L (ref 0–40)
Albumin/Globulin Ratio: 1.4 (ref 1.1–2.5)
Albumin: 4 g/dL (ref 3.5–4.7)
Alkaline Phosphatase: 75 IU/L (ref 39–117)
BUN/Creatinine Ratio: 20 (ref 11–26)
BUN: 23 mg/dL (ref 8–27)
CO2: 25 mmol/L (ref 18–29)
Calcium: 9.8 mg/dL (ref 8.7–10.3)
Chloride: 99 mmol/L (ref 97–108)
Creatinine, Ser: 1.15 mg/dL — ABNORMAL HIGH (ref 0.57–1.00)
GFR calc Af Amer: 51 mL/min/{1.73_m2} — ABNORMAL LOW (ref >59)
GFR calc non Af Amer: 44 mL/min/{1.73_m2} — ABNORMAL LOW (ref >59)
Globulin, Total: 2.9 g/dL (ref 1.5–4.5)
Glucose: 95 mg/dL (ref 65–99)
Potassium: 3.9 mmol/L (ref 3.5–5.2)
Sodium: 141 mmol/L (ref 134–144)
Total Bilirubin: 0.5 mg/dL (ref 0.0–1.2)
Total Protein: 6.9 g/dL (ref 6.0–8.5)

## 2014-03-21 LAB — THYROID PANEL WITH TSH
Free Thyroxine Index: 2.2 (ref 1.2–4.9)
T3 Uptake Ratio: 28 % (ref 24–39)
T4, Total: 8 ug/dL (ref 4.5–12.0)
TSH: 4.51 u[IU]/mL — ABNORMAL HIGH (ref 0.450–4.500)

## 2014-03-22 ENCOUNTER — Other Ambulatory Visit: Payer: Self-pay | Admitting: Family Medicine

## 2014-03-22 MED ORDER — LEVOTHYROXINE SODIUM 25 MCG PO CAPS: 25.0000 ug | ORAL_CAPSULE | Freq: Every day | ORAL | Status: DC

## 2014-04-16 NOTE — Progress Notes (Signed)
Patient ID: [name], female   DOB: 09/04/1928, 81 y.o.   MRN: [id number] 81 y.o.  patient of Dr Verl Blalock History of MV surgery PAF and HTN. Had fatigue in past with higher dose beta blocker Also history of HTN  Echo 12/03/11 Looked good I take care of her husband who has had an AVR  Study Conclusions  - Left ventricle: The estimated ejection fraction was 60%. Wall motion was normal; there were no regional wall motion abnormalities. Doppler parameters are consistent with high ventricular filling pressure. - Aortic valve: The valve appears to be grossly normal. Mild regurgitation. - Mitral valve: Mitral valve repair is working well. Valve area by pressure half-time: 2.27cm^2. - Left atrium: The atrium was mildly dilated. - Right atrium: The atrium was mildly dilated. - Pulmonary arteries: PA peak pressure: 55mm Hg (S).  Still driving Needs refill on BP meds  Has some varicose veins that look bad but don't hurt and no edema     ROS: Denies fever, malais, weight loss, blurry vision, decreased visual acuity, cough, sputum, SOB, hemoptysis, pleuritic pain, palpitaitons, heartburn, abdominal pain, melena, lower extremity edema, claudication, or rash.  All other systems reviewed and negative  General: Affect appropriate Healthy:  appears stated age 81: normal Neck supple with no adenopathy JVP normal no bruits no thyromegaly Lungs clear with no wheezing and good diaphragmatic motion Heart:  S1/S2 no murmur, no rub, gallop or click PMI normal Abdomen: benighn, BS positve, no tenderness, no AAA no bruit.  No HSM or HJR Distal pulses intact with no bruits No edema  Varicose veins RLE greater than left  Neuro non-focal Skin warm and dry No muscular weakness   Current Outpatient Prescriptions  Medication Sig Dispense Refill  . azithromycin (ZITHROMAX) 250 MG tablet Take 2 po first day and then one po qd x 4 days 6 tablet 0  . benzonatate (TESSALON PERLES) 100 MG capsule Take 1 capsule  (100 mg total) by mouth 3 (three) times daily as needed. 20 capsule 1  . Cholecalciferol (VITAMIN D3) 10000 UNITS capsule Take 10,000 Units by mouth daily.      Marland Kitchen estradiol (ESTRACE) 0.1 MG/GM vaginal cream Place 2 g vaginally as directed.      Marland Kitchen estradiol (VIVELLE-DOT) 0.025 MG/24HR Place 1 patch onto the skin 2 (two) times a week.      . hydrochlorothiazide (HYDRODIURIL) 25 MG tablet TAKE ONE TABLET BY MOUTH ONCE DAILY 90 tablet 0  . irbesartan (AVAPRO) 300 MG tablet Take 1 tablet (300 mg total) by mouth daily. 30 tablet 11  . Levothyroxine Sodium 25 MCG CAPS Take 1 capsule (25 mcg total) by mouth daily before breakfast. 30 capsule 11  . metoprolol (LOPRESSOR) 50 MG tablet Take 1 tablet (50 mg total) by mouth 2 (two) times daily. 60 tablet 11   Current Facility-Administered Medications  Medication Dose Route Frequency Provider Last Rate Last Dose  . levalbuterol (XOPENEX) nebulizer solution 1.25 mg  1.25 mg Nebulization Once Lysbeth Penner, FNP   1.25 mg at 09/08/13 1715    Allergies  Aspirin-caffeine; Codeine; and Sulfonamide derivatives  Electrocardiogram:   8/15 SR rate 57 RBBB no change from 2014   Assessment and Plan

## 2014-04-17 ENCOUNTER — Ambulatory Visit (INDEPENDENT_AMBULATORY_CARE_PROVIDER_SITE_OTHER): Payer: Medicare Other | Admitting: Cardiovascular Disease

## 2014-04-17 ENCOUNTER — Encounter: Payer: Self-pay | Admitting: Cardiovascular Disease

## 2014-04-17 VITALS — BP 126/72 | HR 56 | Ht 62.0 in | Wt 114.0 lb

## 2014-04-17 DIAGNOSIS — I340 Nonrheumatic mitral (valve) insufficiency: Secondary | ICD-10-CM

## 2014-04-17 DIAGNOSIS — I868 Varicose veins of other specified sites: Secondary | ICD-10-CM

## 2014-04-17 DIAGNOSIS — I8390 Asymptomatic varicose veins of unspecified lower extremity: Secondary | ICD-10-CM

## 2014-04-17 DIAGNOSIS — I058 Other rheumatic mitral valve diseases: Secondary | ICD-10-CM

## 2014-04-17 DIAGNOSIS — I10 Essential (primary) hypertension: Secondary | ICD-10-CM | POA: Diagnosis not present

## 2014-04-17 MED ORDER — HYDROCHLOROTHIAZIDE 25 MG PO TABS: 25.0000 mg | ORAL_TABLET | Freq: Every day | ORAL | Status: DC

## 2014-04-17 NOTE — Assessment & Plan Note (Signed)
Chronic  Wearing support hose continue low dose diuretic  No scleroRx given age

## 2014-04-17 NOTE — Assessment & Plan Note (Signed)
Well controlled.  Continue current medications and low sodium Dash type diet.    

## 2014-04-17 NOTE — Patient Instructions (Signed)
Your physician wants you to follow-up in:  6 MONTHS WITH DR NISHAN  You will receive a reminder letter in the mail two months in advance. If you don't receive a letter, please call our office to schedule the follow-up appointment. Your physician recommends that you continue on your current medications as directed. Please refer to the Current Medication list given to you today. 

## 2014-04-17 NOTE — Assessment & Plan Note (Signed)
S/P mitral valve repair No diastolic or systolic murmurs  SBE  Normal EF no need for echo at this time

## 2014-04-25 DIAGNOSIS — H25811 Combined forms of age-related cataract, right eye: Secondary | ICD-10-CM | POA: Diagnosis not present

## 2014-05-02 DIAGNOSIS — H25811 Combined forms of age-related cataract, right eye: Secondary | ICD-10-CM | POA: Diagnosis not present

## 2014-05-15 DIAGNOSIS — Z1231 Encounter for screening mammogram for malignant neoplasm of breast: Secondary | ICD-10-CM | POA: Diagnosis not present

## 2014-05-29 DIAGNOSIS — H2511 Age-related nuclear cataract, right eye: Secondary | ICD-10-CM | POA: Diagnosis not present

## 2014-05-29 DIAGNOSIS — H25811 Combined forms of age-related cataract, right eye: Secondary | ICD-10-CM | POA: Diagnosis not present

## 2014-08-28 DIAGNOSIS — H26491 Other secondary cataract, right eye: Secondary | ICD-10-CM | POA: Diagnosis not present

## 2014-09-25 DIAGNOSIS — Z6821 Body mass index (BMI) 21.0-21.9, adult: Secondary | ICD-10-CM | POA: Diagnosis not present

## 2014-09-25 DIAGNOSIS — Z01419 Encounter for gynecological examination (general) (routine) without abnormal findings: Secondary | ICD-10-CM | POA: Diagnosis not present

## 2014-10-28 ENCOUNTER — Other Ambulatory Visit: Payer: Self-pay | Admitting: Cardiovascular Disease

## 2014-10-30 DIAGNOSIS — H02831 Dermatochalasis of right upper eyelid: Secondary | ICD-10-CM | POA: Diagnosis not present

## 2014-10-30 DIAGNOSIS — H02834 Dermatochalasis of left upper eyelid: Secondary | ICD-10-CM | POA: Diagnosis not present

## 2014-11-24 ENCOUNTER — Other Ambulatory Visit: Payer: Self-pay | Admitting: Cardiovascular Disease

## 2014-12-04 ENCOUNTER — Telehealth: Payer: Self-pay | Admitting: Cardiovascular Disease

## 2014-12-04 NOTE — Telephone Encounter (Signed)
New message      Talk to the nurse regarding her medications.  Questions about 2 of them

## 2014-12-04 NOTE — Telephone Encounter (Signed)
PER PT  HAVING EYE SURGERY  NEXT  WEEK  WAS  WANTING TO  KNOW   IF ANY MEDS   WERE TO BE HELD   ONLY MED  NEEDING TO   BE  HELD  WOULD BE    HCTZ  BUT  TO TAKE LATER IN DAY AFTER  PROCEDURE . PT VERBALIZED UNDERSTANDING.  NO  CLEARANCE  FORM RECEIVED  FROM  SURGEON   DOING PROCEDURE .Adonis Housekeeper

## 2014-12-11 DIAGNOSIS — H02831 Dermatochalasis of right upper eyelid: Secondary | ICD-10-CM | POA: Diagnosis not present

## 2014-12-11 DIAGNOSIS — H02834 Dermatochalasis of left upper eyelid: Secondary | ICD-10-CM | POA: Diagnosis not present

## 2014-12-22 ENCOUNTER — Other Ambulatory Visit: Payer: Self-pay | Admitting: Cardiovascular Disease

## 2014-12-22 NOTE — Telephone Encounter (Signed)
Josue Hector, MD at 04/16/2014 2:49 PM  irbesartan (AVAPRO) 300 MG tabletTake 1 tablet (300 mg total) by mouth daily metoprolol (LOPRESSOR) 50 MG tabletTake 1 tablet (50 mg total) by mouth 2 (two) times daily Patient Instructions     Your physician wants you to follow-up in: [name] Cancel

## 2015-01-27 NOTE — Progress Notes (Signed)
Patient ID: [name], female   DOB: 06/07/1928, 81 y.o.   MRN: [id number] 81 y.o.  patient of Dr Verl Blalock History of MV surgery PAF and HTN. Had fatigue in past with higher dose beta blocker Also history of HTN   Echo 12/03/11 Looked good I take care of her husband who has had an AVR  Study Conclusions  - Left ventricle: The estimated ejection fraction was 60%. Wall motion was normal; there were no regional wall motion abnormalities. Doppler parameters are consistent with high ventricular filling pressure. - Aortic valve: The valve appears to be grossly normal. Mild regurgitation. - Mitral valve: Mitral valve repair is working well. Valve area by pressure half-time: 2.27cm^2. - Left atrium: The atrium was mildly dilated. - Right atrium: The atrium was mildly dilated. - Pulmonary arteries: PA peak pressure: 69mm Hg (S).  Still driving Needs refill on BP meds  Has some varicose veins that look bad but don't hurt and no edema    ROS: Denies fever, malais, weight loss, blurry vision, decreased visual acuity, cough, sputum, SOB, hemoptysis, pleuritic pain, palpitaitons, heartburn, abdominal pain, melena, lower extremity edema, claudication, or rash.  All other systems reviewed and negative  General: Affect appropriate Healthy:  appears stated age 37: normal Neck supple with no adenopathy JVP normal no bruits no thyromegaly Lungs clear with no wheezing and good diaphragmatic motion Heart:  S1/S2 no murmur, no rub, gallop or click PMI normal Abdomen: benighn, BS positve, no tenderness, no AAA no bruit.  No HSM or HJR Distal pulses intact with no bruits No edema  Varicose veins RLE greater than left  Neuro non-focal Skin warm and dry No muscular weakness   Current Outpatient Prescriptions  Medication Sig Dispense Refill  . azithromycin (ZITHROMAX) 250 MG tablet Take 2 po first day and then one po qd x 4 days 6 tablet 0  . benzonatate (TESSALON PERLES) 100 MG capsule Take 1 capsule  (100 mg total) by mouth 3 (three) times daily as needed. 20 capsule 1  . Cholecalciferol (VITAMIN D3) 10000 UNITS capsule Take 10,000 Units by mouth daily.      Marland Kitchen estradiol (ESTRACE) 0.1 MG/GM vaginal cream Place 2 g vaginally as directed.      Marland Kitchen estradiol (VIVELLE-DOT) 0.025 MG/24HR Place 1 patch onto the skin 2 (two) times a week.      . hydrochlorothiazide (HYDRODIURIL) 25 MG tablet Take 1 tablet (25 mg total) by mouth daily. 90 tablet 3  . irbesartan (AVAPRO) 300 MG tablet TAKE ONE TABLET BY MOUTH ONCE DAILY 30 tablet 0  . Levothyroxine Sodium 25 MCG CAPS Take 1 capsule (25 mcg total) by mouth daily before breakfast. 30 capsule 11  . metoprolol (LOPRESSOR) 50 MG tablet TAKE ONE TABLET BY MOUTH TWICE DAILY MUST  HAVE  APPOINTMENT  FOR  ADDITIONAL  REFILLS 60 tablet 0   Current Facility-Administered Medications  Medication Dose Route Frequency Provider Last Rate Last Dose  . levalbuterol (XOPENEX) nebulizer solution 1.25 mg  1.25 mg Nebulization Once Lysbeth Penner, FNP   1.25 mg at 09/08/13 1715    Allergies  Aspirin-caffeine; Codeine; and Sulfonamide derivatives  Electrocardiogram:   8/15 SR rate 57 RBBB no change from 2014   01/29/15  SR rate 65  RBBB no changes   Assessment and Plan MV Repair:  SBE  No murmur good repair stable HTN: Well controlled.  Continue current medications and low sodium Dash type diet.   Thyroid:  Continue current replacement dose consider increasing  by 25ug Lab Results  Component Value Date   TSH 4.510* 03/20/2014   PAF:  Maint NSR no palpitations ECG looks fine no change from 2015   [facility]

## 2015-01-29 ENCOUNTER — Encounter: Payer: Self-pay | Admitting: Cardiovascular Disease

## 2015-01-29 ENCOUNTER — Ambulatory Visit (INDEPENDENT_AMBULATORY_CARE_PROVIDER_SITE_OTHER): Payer: Medicare Other | Admitting: Cardiovascular Disease

## 2015-01-29 VITALS — BP 140/64 | HR 65 | Ht 62.0 in | Wt 114.0 lb

## 2015-01-29 DIAGNOSIS — I340 Nonrheumatic mitral (valve) insufficiency: Secondary | ICD-10-CM

## 2015-01-29 DIAGNOSIS — I10 Essential (primary) hypertension: Secondary | ICD-10-CM

## 2015-01-29 DIAGNOSIS — I058 Other rheumatic mitral valve diseases: Secondary | ICD-10-CM

## 2015-01-29 NOTE — Patient Instructions (Signed)

## 2015-01-30 ENCOUNTER — Other Ambulatory Visit: Payer: Self-pay | Admitting: Cardiovascular Disease

## 2015-02-26 ENCOUNTER — Other Ambulatory Visit: Payer: Self-pay | Admitting: Cardiovascular Disease

## 2015-03-16 ENCOUNTER — Ambulatory Visit (INDEPENDENT_AMBULATORY_CARE_PROVIDER_SITE_OTHER): Payer: Medicare Other

## 2015-03-16 DIAGNOSIS — Z23 Encounter for immunization: Secondary | ICD-10-CM | POA: Diagnosis not present

## 2015-04-05 IMAGING — CR DG CHEST 2V
2 series · 2 of 2 positions shown · non-contrast
Comparison: DG CHEST 2V dated 07/27/2012

CLINICAL DATA: Fever, cough

EXAM:
CHEST  2 VIEW

[view not recorded (1 of 2)]
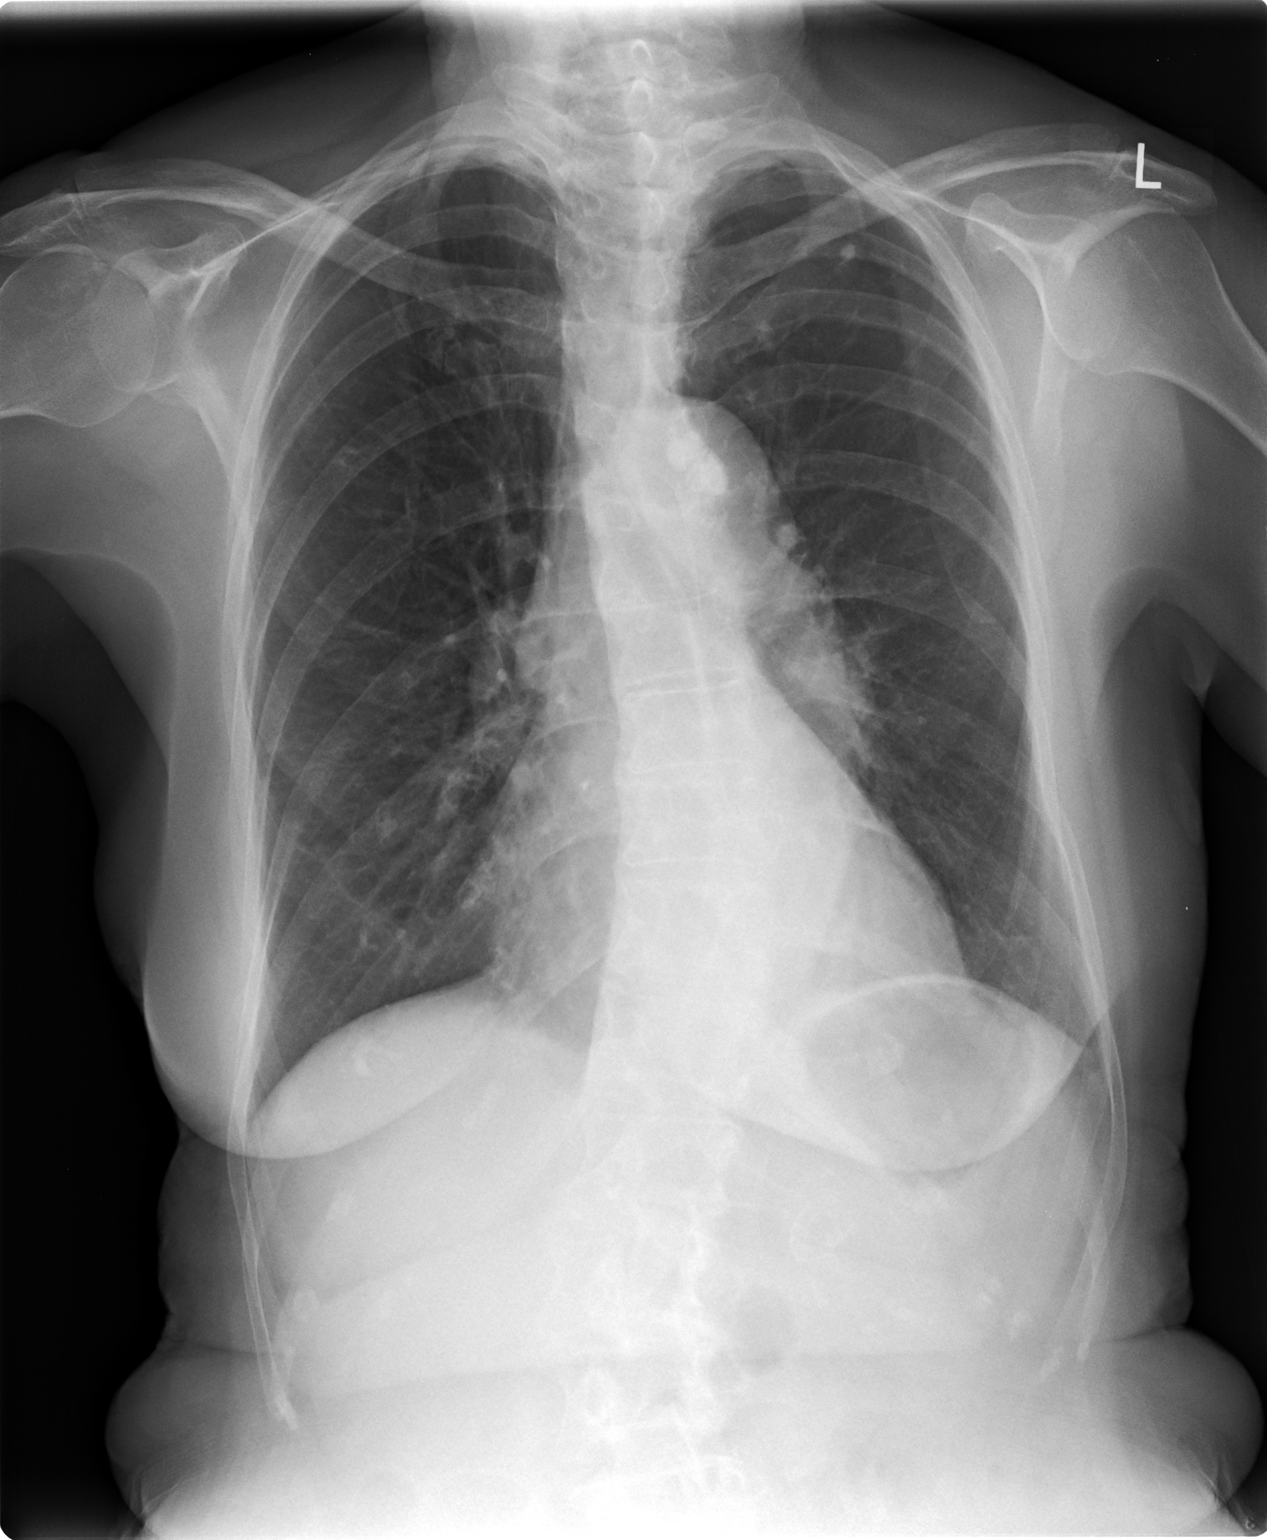

[view not recorded (2 of 2)]
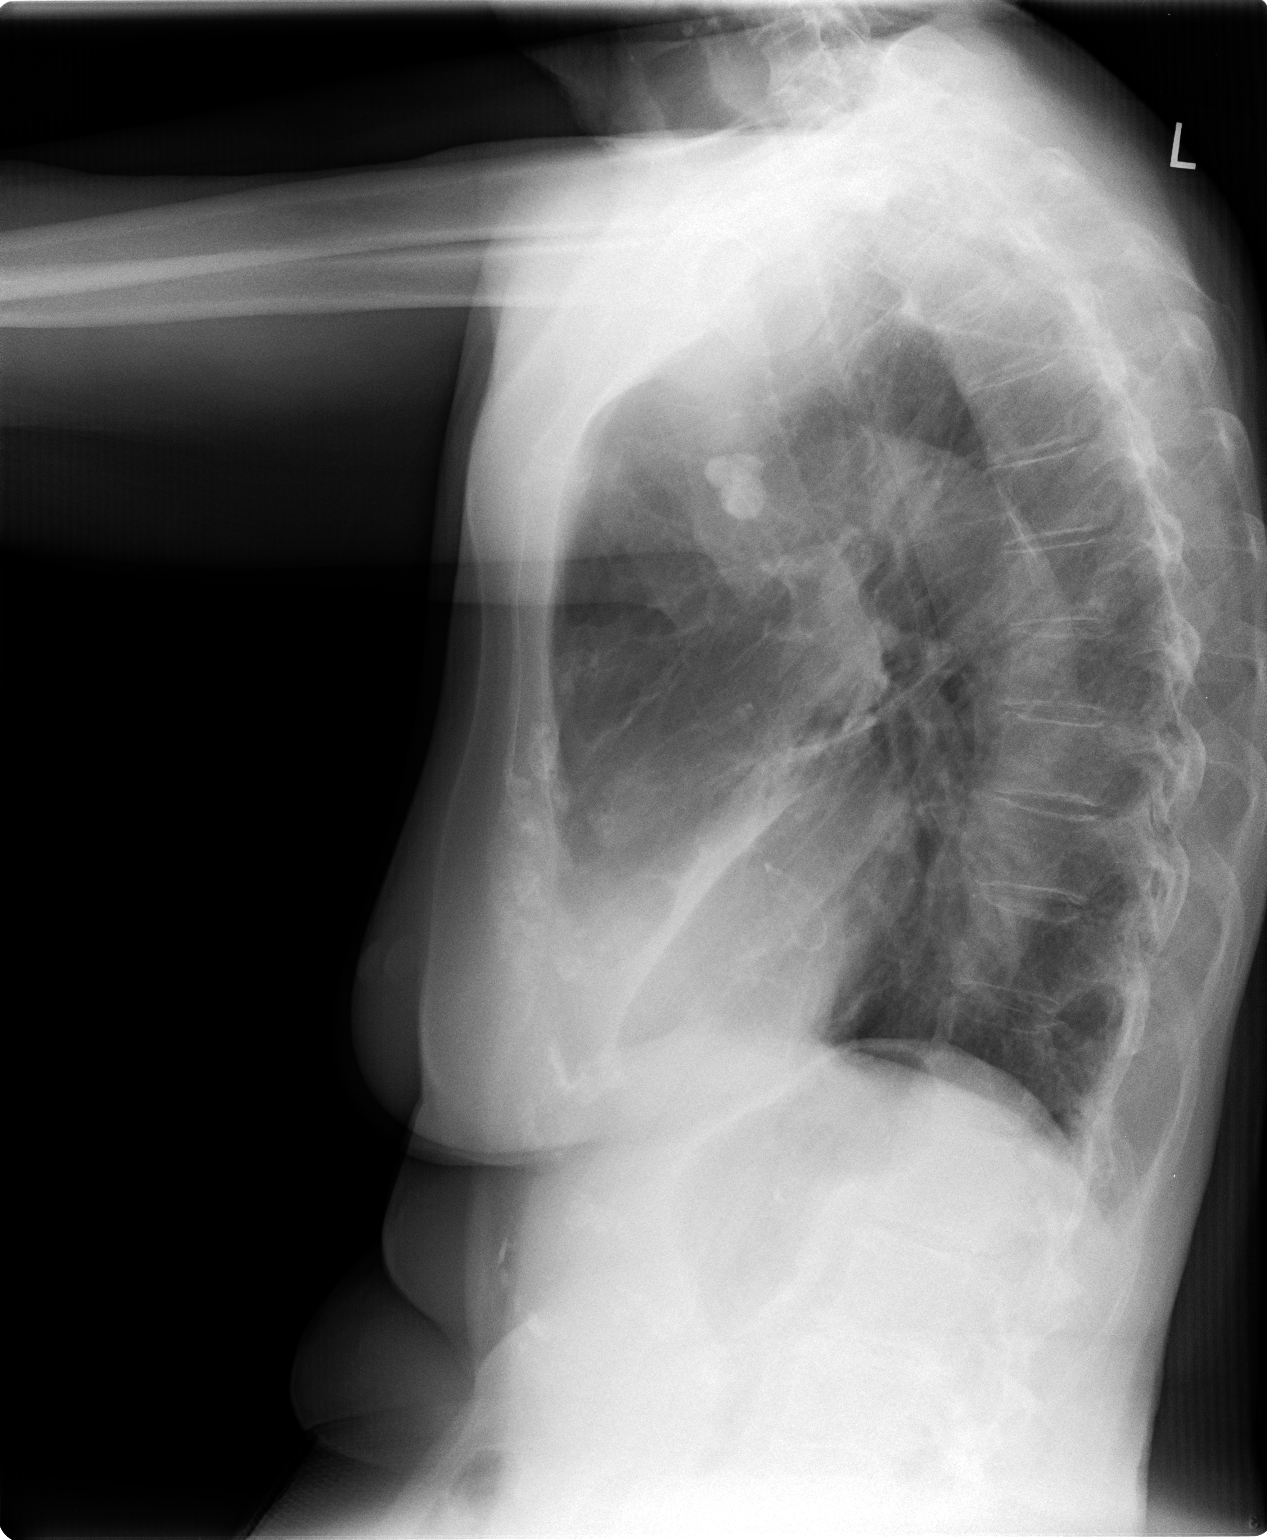

[2 of 2 positions shown; findings below may reference images not displayed]

FINDINGS: Calcified left upper lobe pulmonary nodule likely representing
sequela prior granulomatous disease. There is no focal parenchymal
opacity, pleural effusion, or pneumothorax. The heart and
mediastinal contours are unremarkable. There are calcified left
periaortic lymph nodes.

There is a levo curvature of the thoracic spine.
IMPRESSION: No active cardiopulmonary disease.

## 2015-05-22 ENCOUNTER — Ambulatory Visit (INDEPENDENT_AMBULATORY_CARE_PROVIDER_SITE_OTHER): Payer: Medicare Other | Admitting: Pediatrics

## 2015-05-22 ENCOUNTER — Encounter: Payer: Self-pay | Admitting: Pediatrics

## 2015-05-22 VITALS — BP 101/61 | HR 78 | Temp 99.2°F | Ht 62.0 in | Wt 110.0 lb

## 2015-05-22 DIAGNOSIS — H65111 Acute and subacute allergic otitis media (mucoid) (sanguinous) (serous), right ear: Secondary | ICD-10-CM

## 2015-05-22 DIAGNOSIS — J069 Acute upper respiratory infection, unspecified: Secondary | ICD-10-CM | POA: Diagnosis not present

## 2015-05-22 MED ORDER — AZITHROMYCIN 250 MG PO TABS: ORAL_TABLET | ORAL | Status: DC

## 2015-05-22 NOTE — Progress Notes (Signed)
    Subjective:    Patient ID: [name], female    DOB: August 17, 1928, 81 y.o.   MRN: [id number]  CC: Cough and Nasal Congestion   HPI: [name] is a 82 y.o. female presenting for Cough and Nasal Congestion  Having chills at home Coughing for past 4 days Appetite down Minimal congestion  Husband just dx with lung ca, came home over the weekend on hospice Pt says she is doing ok, he was given 3-6 mo by oncologist   Depression screen [facility] 2/9 05/22/2015 03/17/2014 09/08/2013  Decreased Interest 0 0 0  Down, Depressed, Hopeless 0 0 0  PHQ - 2 Score 0 0 0     Relevant past medical, surgical, family and social history reviewed and updated as indicated. Interim medical history since our last visit reviewed. Allergies and medications reviewed and updated.    ROS: Per HPI unless specifically indicated above  History  Smoking status  . Never Smoker   Smokeless tobacco  . Not on file    Past Medical History Patient Active Problem List   Diagnosis Date Noted  . RBBB 10/12/2013  . Varicose veins 10/12/2013  . Fatigue 11/06/2011  . Orthostatic hypotension 11/06/2011  . Mitral valve failure 10/09/2010  . PULMONARY HYPERTENSION, MILD 09/26/2009  . DIVERTICULOSIS, COLON 09/26/2009  . TRANSIENT ISCHEMIC ATTACKS, HX OF 09/26/2009  . COLONIC POLYPS, ADENOMATOUS, HX OF 09/26/2009  . ISCHEMIC COLITIS, HX OF 09/26/2009  . Essential hypertension 08/26/2008        Objective:    BP 101/61 mmHg  Pulse 78  Temp(Src) 99.2 F (37.3 C) (Oral)  Ht 5\' 2"  (1.575 m)  Wt 110 lb (49.896 kg)  BMI 20.11 kg/m2  SpO2 96%  Wt Readings from Last 3 Encounters:  05/22/15 110 lb (49.896 kg)  01/29/15 114 lb (51.71 kg)  04/17/14 114 lb (51.71 kg)     Gen: NAD, alert, cooperative with exam, NCAT, congested, coughing EYES: EOMI, no scleral injection or icterus ENT: R TM with layer of opaque white fluid, L TM red, also with small amount opaque fluid. OP with mild erythema LYMPH: no  cervical LAD CV: NRRR, normal S1/S2, no murmur, distal pulses 2+ b/l Resp: CTABL, no wheezes, normal WOB Abd: +BS, soft, NTND. Ext: No edema, warm Neuro: Alert and oriented, strength equal b/l UE and LE, coordination grossly normal MSK: normal muscle bulk     Assessment & Plan:    [name] was seen today for cough and nasal congestion, Baker ot have b/l AOM. Discussed may continue to cough for a couple of weeks. Lung exam normal today. If worsening needs to be seen.  Diagnoses and all orders for this visit:  Acute mucoid otitis media of right ear -     azithromycin (ZITHROMAX) 250 MG tablet; Take 2 the first day and then one each day after.  Acute URI    Follow up plan: Return if symptoms worsen or fail to improve.  Sharon Found, MD [facility] 05/22/2015, 9:40 AM

## 2015-05-24 ENCOUNTER — Telehealth: Payer: Self-pay | Admitting: Family Medicine

## 2015-05-24 DIAGNOSIS — H6693 Otitis media, unspecified, bilateral: Secondary | ICD-10-CM

## 2015-05-24 MED ORDER — AMOXICILLIN 500 MG PO CAPS: 500.0000 mg | ORAL_CAPSULE | Freq: Two times a day (BID) | ORAL | Status: DC

## 2015-05-24 NOTE — Telephone Encounter (Signed)
Can stop azithromycin. Start amoxicillin to finish additional 5 days for ear infections. Cough could go on for another couple weeks, keep water near you.

## 2015-05-24 NOTE — Telephone Encounter (Signed)
Spoke with patient's daughter.  She states that patient still has a cough and the tussinex is not working and patient can not take delsym.  Informed patient that cough will linger.

## 2015-05-26 ENCOUNTER — Ambulatory Visit (INDEPENDENT_AMBULATORY_CARE_PROVIDER_SITE_OTHER): Payer: Medicare Other | Admitting: Family Medicine

## 2015-05-26 ENCOUNTER — Encounter: Payer: Self-pay | Admitting: Family Medicine

## 2015-05-26 VITALS — BP 127/72 | HR 71 | Temp 97.2°F | Ht 62.0 in | Wt 109.2 lb

## 2015-05-26 DIAGNOSIS — J209 Acute bronchitis, unspecified: Secondary | ICD-10-CM | POA: Diagnosis not present

## 2015-05-26 MED ORDER — BETAMETHASONE SOD PHOS & ACET 6 (3-3) MG/ML IJ SUSP
6.0000 mg | Freq: Once | INTRAMUSCULAR | Status: AC
  Administered 2015-05-26: 6 mg via INTRAMUSCULAR

## 2015-05-26 MED ORDER — BENZONATATE 200 MG PO CAPS: 200.0000 mg | ORAL_CAPSULE | Freq: Three times a day (TID) | ORAL | Status: DC | PRN

## 2015-05-26 MED ORDER — LEVOFLOXACIN 500 MG PO TABS: 500.0000 mg | ORAL_TABLET | Freq: Every day | ORAL | Status: DC

## 2015-05-26 NOTE — Progress Notes (Signed)
Subjective:  Patient ID: [name], female    DOB: 11-26-1928  Age: 82 y.o. MRN: [id number]  CC: Cough   HPI SHANTELL REMALY presents for Increasing cough in spite of use Zpack and Honey and lemon. Unable to use stronger cough meds due to tongue swelling when she took codeine. Upset stomach as well. Cough is profuse. Constant, kept her awake for 2 nights. Onset was several days before that. The ear is not painful  Now.  Husband died this week. Pt. Distraught. Worried about missing funeral due to the cough. Denies depression, but overwhelmed by current circumstances.   History Ova has a past medical history of Atrial fibrillation ([place]); MR (mitral regurgitation); Pericarditis; HTN (hypertension); CVA (cerebral vascular accident) ([place]); and Arthritis.   She has past surgical history that includes anterior repair with perigee graft (04/16/06); Anterior and posterior vaginal repair (10/15/04); sacral spinous ligament suspension of vagina; suprapubic cystectomy; Total abdominal hysterectomy w/ bilateral salpingoophorectomy; and Mitral valve replacement.   Her family history includes Colon cancer in her brother; Heart attack in her father; Heart disease in her father and mother; Lupus in her father.She reports that she has never smoked. She does not have any smokeless tobacco history on file. She reports that she does not drink alcohol or use illicit drugs.    ROS Review of Systems  Constitutional: Negative for fever, chills, activity change and appetite change.  HENT: Positive for congestion, postnasal drip, rhinorrhea and sinus pressure. Negative for ear discharge, ear pain, hearing loss, nosebleeds, sneezing and trouble swallowing.   Respiratory: Positive for cough. Negative for chest tightness and shortness of breath.   Cardiovascular: Negative for chest pain and palpitations.  Skin: Negative for rash.    Objective:  BP 127/72 mmHg  Pulse 71  Temp(Src) 97.2 F (36.2 C) (Oral)  Ht 5'  2" (1.575 m)  Wt 109 lb 3.2 oz (49.533 kg)  BMI 19.97 kg/m2  BP Readings from Last 3 Encounters:  05/26/15 127/72  05/22/15 101/61  01/29/15 140/64    Wt Readings from Last 3 Encounters:  05/26/15 109 lb 3.2 oz (49.533 kg)  05/22/15 110 lb (49.896 kg)  01/29/15 114 lb (51.71 kg)     Physical Exam  Constitutional: She appears well-developed and well-nourished.  HENT:  Head: Normocephalic and atraumatic.  Right Ear: Tympanic membrane and external ear normal. No decreased hearing is noted.  Left Ear: Tympanic membrane and external ear normal. No decreased hearing is noted.  Nose: Mucosal edema present. Right sinus exhibits no frontal sinus tenderness. Left sinus exhibits no frontal sinus tenderness.  Mouth/Throat: No oropharyngeal exudate or posterior oropharyngeal erythema.  Neck: No Brudzinski's sign noted.  Pulmonary/Chest: Breath sounds normal. No respiratory distress.  Lymphadenopathy:       Head (right side): No preauricular adenopathy present.       Head (left side): No preauricular adenopathy present.       Right cervical: No superficial cervical adenopathy present.      Left cervical: No superficial cervical adenopathy present.     Lab Results  Component Value Date   WBC 5.9 11/06/2011   HGB 12.5 11/06/2011   HCT 37.8 11/06/2011   PLT 215.0 11/06/2011   GLUCOSE 95 03/20/2014   CHOL 204* 03/20/2014   TRIG 102 03/20/2014   HDL 60 03/20/2014   LDLCALC 124* 03/20/2014   ALT 10 03/20/2014   AST 12 03/20/2014   NA 141 03/20/2014   K 3.9 03/20/2014   CL 99 03/20/2014  CREATININE 1.15* 03/20/2014   BUN 23 03/20/2014   CO2 25 03/20/2014   TSH 4.510* 03/20/2014    No results found.  Assessment & Plan:   Donnajean was seen today for cough.  Diagnoses and all orders for this visit:  Acute bronchitis, unspecified organism -     betamethasone acetate-betamethasone sodium phosphate (CELESTONE) injection 6 mg; Inject 1 mL (6 mg total) into the muscle once.  Other  orders -     levofloxacin (LEVAQUIN) 500 MG tablet; Take 1 tablet (500 mg total) by mouth daily. -     benzonatate (TESSALON) 200 MG capsule; Take 1 capsule (200 mg total) by mouth 3 (three) times daily as needed for cough.      I have discontinued Ms. Fata's azithromycin. I am also having her start on levofloxacin and benzonatate. Additionally, I am having her maintain her estradiol, Vitamin D3, estradiol, Levothyroxine Sodium, hydrochlorothiazide, irbesartan, irbesartan, metoprolol, and amoxicillin. We will continue to administer levalbuterol and betamethasone acetate-betamethasone sodium phosphate.  Meds ordered this encounter  Medications  . betamethasone acetate-betamethasone sodium phosphate (CELESTONE) injection 6 mg    Sig:   . levofloxacin (LEVAQUIN) 500 MG tablet    Sig: Take 1 tablet (500 mg total) by mouth daily.    Dispense:  7 tablet    Refill:  0  . benzonatate (TESSALON) 200 MG capsule    Sig: Take 1 capsule (200 mg total) by mouth 3 (three) times daily as needed for cough.    Dispense:  20 capsule    Refill:  0     Follow-up: Return if symptoms worsen or fail to improve.  [name], M.D.

## 2015-06-02 NOTE — Telephone Encounter (Signed)
Detailed message left for patient.

## 2015-06-25 ENCOUNTER — Other Ambulatory Visit: Payer: Self-pay | Admitting: Cardiovascular Disease

## 2015-07-26 DIAGNOSIS — Z1231 Encounter for screening mammogram for malignant neoplasm of breast: Secondary | ICD-10-CM | POA: Diagnosis not present

## 2015-08-02 ENCOUNTER — Ambulatory Visit: Payer: Medicare Other | Admitting: Cardiovascular Disease

## 2015-08-29 DIAGNOSIS — N762 Acute vulvitis: Secondary | ICD-10-CM | POA: Diagnosis not present

## 2015-10-03 DIAGNOSIS — Z681 Body mass index (BMI) 19 or less, adult: Secondary | ICD-10-CM | POA: Diagnosis not present

## 2015-10-03 DIAGNOSIS — Z124 Encounter for screening for malignant neoplasm of cervix: Secondary | ICD-10-CM | POA: Diagnosis not present

## 2015-10-03 DIAGNOSIS — Z1272 Encounter for screening for malignant neoplasm of vagina: Secondary | ICD-10-CM | POA: Diagnosis not present

## 2015-10-03 DIAGNOSIS — Z01419 Encounter for gynecological examination (general) (routine) without abnormal findings: Secondary | ICD-10-CM | POA: Diagnosis not present

## 2015-10-19 ENCOUNTER — Ambulatory Visit (INDEPENDENT_AMBULATORY_CARE_PROVIDER_SITE_OTHER): Payer: Medicare Other | Admitting: Family

## 2015-10-19 ENCOUNTER — Encounter: Payer: Self-pay | Admitting: Family

## 2015-10-19 VITALS — BP 123/71 | HR 71 | Temp 97.0°F | Ht 62.0 in | Wt 106.2 lb

## 2015-10-19 DIAGNOSIS — J309 Allergic rhinitis, unspecified: Secondary | ICD-10-CM

## 2015-10-19 MED ORDER — FLUTICASONE PROPIONATE 50 MCG/ACT NA SUSP: 2.0000 | Freq: Every day | NASAL | 6 refills | Status: DC

## 2015-10-19 NOTE — Patient Instructions (Signed)
Allergic Rhinitis Allergic rhinitis is when the mucous membranes in the nose respond to allergens. Allergens are particles in the air that cause your body to have an allergic reaction. This causes you to release allergic antibodies. Through a chain of events, these eventually cause you to release histamine into the blood stream. Although meant to protect the body, it is this release of histamine that causes your discomfort, such as frequent sneezing, congestion, and an itchy, runny nose.  CAUSES Seasonal allergic rhinitis (hay fever) is caused by pollen allergens that may come from grasses, trees, and weeds. Year-round allergic rhinitis (perennial allergic rhinitis) is caused by allergens such as house dust mites, pet dander, and mold spores. SYMPTOMS  Nasal stuffiness (congestion).  Itchy, runny nose with sneezing and tearing of the eyes. DIAGNOSIS Your health care provider can help you determine the allergen or allergens that trigger your symptoms. If you and your health care provider are unable to determine the allergen, skin or blood testing may be used. Your health care provider will diagnose your condition after taking your health history and performing a physical exam. Your health care provider may assess you for other related conditions, such as asthma, pink eye, or an ear infection. TREATMENT Allergic rhinitis does not have a cure, but it can be controlled by:  Medicines that block allergy symptoms. These may include allergy shots, nasal sprays, and oral antihistamines.  Avoiding the allergen. Hay fever may often be treated with antihistamines in pill or nasal spray forms. Antihistamines block the effects of histamine. There are over-the-counter medicines that may help with nasal congestion and swelling around the eyes. Check with your health care provider before taking or giving this medicine. If avoiding the allergen or the medicine prescribed do not work, there are many new medicines  your health care provider can prescribe. Stronger medicine may be used if initial measures are ineffective. Desensitizing injections can be used if medicine and avoidance does not work. Desensitization is when a patient is given ongoing shots until the body becomes less sensitive to the allergen. Make sure you follow up with your health care provider if problems continue. HOME CARE INSTRUCTIONS It is not possible to completely avoid allergens, but you can reduce your symptoms by taking steps to limit your exposure to them. It helps to know exactly what you are allergic to so that you can avoid your specific triggers. SEEK MEDICAL CARE IF:  You have a fever.  You develop a cough that does not stop easily (persistent).  You have shortness of breath.  You start wheezing.  Symptoms interfere with normal daily activities.   This information is not intended to replace advice given to you by your health care provider. Make sure you discuss any questions you have with your health care provider.   Document Released: 11/19/2000 Document Revised: 03/17/2014 Document Reviewed: 11/01/2012 Elsevier Interactive Patient Education 2016 [employer]  

## 2015-10-19 NOTE — Progress Notes (Signed)
   Subjective:    Patient ID: [name], female    DOB: 09/19/1928, 82 y.o.   MRN: [id number]  Sinusitis  This is a new problem. The current episode started yesterday. The problem has been gradually worsening since onset. There has been no fever. Her pain is at a severity of 2/10. The pain is mild. Associated symptoms include congestion, coughing, ear pain, a hoarse voice, sinus pressure, sneezing and a sore throat. Pertinent negatives include no headaches. Past treatments include acetaminophen. The treatment provided mild relief.      Review of Systems  HENT: Positive for congestion, ear pain, hoarse voice, sinus pressure, sneezing and sore throat.   Respiratory: Positive for cough.   Neurological: Negative for headaches.  All other systems reviewed and are negative.      Objective:   Physical Exam  Constitutional: She is oriented to person, place, and time. She appears well-developed and well-nourished. No distress.  HENT:  Head: Normocephalic and atraumatic.  Right Ear: External ear normal.  Left Ear: External ear normal.  Nose: Mucosal edema and rhinorrhea present. Right sinus exhibits no maxillary sinus tenderness and no frontal sinus tenderness. Left sinus exhibits no maxillary sinus tenderness and no frontal sinus tenderness.  Mouth/Throat: Posterior oropharyngeal erythema present.  Eyes: Pupils are equal, round, and reactive to light.  Neck: Normal range of motion. Neck supple. No thyromegaly present.  Cardiovascular: Normal rate, regular rhythm, normal heart sounds and intact distal pulses.   No murmur heard. Pulmonary/Chest: Effort normal and breath sounds normal. No respiratory distress. She has no wheezes.  Abdominal: Soft. Bowel sounds are normal. She exhibits no distension. There is no tenderness.  Musculoskeletal: Normal range of motion. She exhibits no edema or tenderness.  Neurological: She is alert and oriented to person, place, and time. She has normal reflexes.  No cranial nerve deficit.  Skin: Skin is warm and dry.  Psychiatric: She has a normal mood and affect. Her behavior is normal. Judgment and thought content normal.  Vitals reviewed.     BP 123/71   Pulse 71   Temp 97 F (36.1 C) (Oral)   Ht 5\' 2"  (1.575 m)   Wt 106 lb 4 oz (48.2 kg)   BMI 19.43 kg/m      Assessment & Plan:  1. Allergic rhinitis, unspecified allergic rhinitis type -- Take meds as prescribed - Use a cool mist humidifier  -Use saline nose sprays frequently -Saline irrigations of the nose can be very helpful if done frequently.  * 4X daily for 1 week*  * Use of a nettie pot can be helpful with this. Follow directions with this* -Force fluids -For any cough or congestion  Use plain Mucinex- regular strength or max strength is fine   * Children- consult with Pharmacist for dosing -For fever or aces or pains- take tylenol or ibuprofen appropriate for age and weight.  * for fevers greater than 101 orally you may alternate ibuprofen and tylenol every  3 hours. -Throat lozenges if help - fluticasone (FLONASE) 50 MCG/ACT nasal spray; Place 2 sprays into both nostrils daily.  Dispense: 16 g; Refill: [place], FNP

## 2015-10-22 NOTE — Progress Notes (Signed)
Patient ID: [name], female   DOB: 09-Sep-1928, 82 y.o.   MRN: [id number] 82 y.o.  patient of Dr Verl Blalock History of MV surgery PAF and HTN. Had fatigue in past with higher dose beta blocker Also history of HTN   Echo 12/03/11 Looked good I take care of her husband who has had an AVR  Study Conclusions  - Left ventricle: The estimated ejection fraction was 60%. Wall motion was normal; there were no regional wall motion abnormalities. Doppler parameters are consistent with high ventricular filling pressure. - Aortic valve: The valve appears to be grossly normal. Mild regurgitation. - Mitral valve: Mitral valve repair is working well. Valve area by pressure half-time: 2.27cm^2. - Left atrium: The atrium was mildly dilated. - Right atrium: The atrium was mildly dilated. - Pulmonary arteries: PA peak pressure: 4mm Hg (S).  Still driving Needs refill on BP meds  Has some varicose veins that look bad but don't hurt and no edema   Husband [name] past this year of lung cancer They were married just short of 60 years   ROS: Denies fever, malais, weight loss, blurry vision, decreased visual acuity, cough, sputum, SOB, hemoptysis, pleuritic pain, palpitaitons, heartburn, abdominal pain, melena, lower extremity edema, claudication, or rash.  All other systems reviewed and negative  General: Affect appropriate Healthy:  appears stated age 31: normal Neck supple with no adenopathy JVP normal no bruits no thyromegaly Lungs clear with no wheezing and good diaphragmatic motion Heart:  S1/S2 no murmur, no rub, gallop or click PMI normal Abdomen: benighn, BS positve, no tenderness, no AAA no bruit.  No HSM or HJR Distal pulses intact with no bruits No edema  Varicose veins RLE greater than left  Neuro non-focal Skin warm and dry No muscular weakness   Current Outpatient Prescriptions  Medication Sig Dispense Refill  . Cholecalciferol (VITAMIN D3) 10000 UNITS capsule Take 10,000 Units by  mouth daily.      . fluticasone (FLONASE) 50 MCG/ACT nasal spray Place 2 sprays into both nostrils daily. 16 g 6  . hydrochlorothiazide (HYDRODIURIL) 25 MG tablet Take 1 tablet (25 mg total) by mouth daily. 90 tablet 3  . irbesartan (AVAPRO) 300 MG tablet Take 1 tablet (300 mg total) by mouth daily. 90 tablet 3  . Levothyroxine Sodium 25 MCG CAPS Take 1 capsule (25 mcg total) by mouth daily before breakfast. 30 capsule 11  . metoprolol (LOPRESSOR) 50 MG tablet Take 1 tablet (50 mg total) by mouth 2 (two) times daily. 180 tablet 3   Current Facility-Administered Medications  Medication Dose Route Frequency Provider Last Rate Last Dose  . levalbuterol (XOPENEX) nebulizer solution 1.25 mg  1.25 mg Nebulization Once Lysbeth Penner, FNP        Allergies  Aspirin-caffeine; Codeine; and Sulfonamide derivatives  Electrocardiogram:   8/15 SR rate 57 RBBB no change from 2014   01/29/15  SR rate 65  RBBB no changes  10/23/15  SR rate 64 RBBB   Assessment and Plan MV Repair:  SBE  No murmur good repair stable HTN: Well controlled.  Continue current medications and low sodium Dash type diet.   Thyroid:  Continue current replacement dose consider increasing by 25ug Lab Results  Component Value Date   TSH 4.510 (H) 03/20/2014   PAF:  Maint NSR no palpitations ECG looks fine no change from 2015   Varicose Veins:  Only painful on long trips encouraged her to wear support hose  Depression:  Husband [name] died of  lung cancer this year has good family support Son [name] lives next door and daughter with her today   [name]

## 2015-10-23 ENCOUNTER — Ambulatory Visit (INDEPENDENT_AMBULATORY_CARE_PROVIDER_SITE_OTHER): Payer: Medicare Other | Admitting: Cardiovascular Disease

## 2015-10-23 ENCOUNTER — Encounter: Payer: Self-pay | Admitting: Cardiovascular Disease

## 2015-10-23 VITALS — BP 140/60 | HR 64 | Ht 63.5 in | Wt 105.8 lb

## 2015-10-23 DIAGNOSIS — I340 Nonrheumatic mitral (valve) insufficiency: Secondary | ICD-10-CM

## 2015-10-23 DIAGNOSIS — I058 Other rheumatic mitral valve diseases: Secondary | ICD-10-CM

## 2015-10-23 MED ORDER — METOPROLOL TARTRATE 50 MG PO TABS: 50.0000 mg | ORAL_TABLET | Freq: Two times a day (BID) | ORAL | 3 refills | Status: DC

## 2015-10-23 MED ORDER — IRBESARTAN 300 MG PO TABS: 300.0000 mg | ORAL_TABLET | Freq: Every day | ORAL | 3 refills | Status: DC

## 2015-10-23 MED ORDER — HYDROCHLOROTHIAZIDE 25 MG PO TABS: 25.0000 mg | ORAL_TABLET | Freq: Every day | ORAL | 3 refills | Status: DC

## 2015-10-23 NOTE — Patient Instructions (Signed)

## 2016-01-22 ENCOUNTER — Ambulatory Visit (INDEPENDENT_AMBULATORY_CARE_PROVIDER_SITE_OTHER): Payer: Medicare Other

## 2016-01-22 DIAGNOSIS — Z23 Encounter for immunization: Secondary | ICD-10-CM

## 2016-02-05 DIAGNOSIS — H01001 Unspecified blepharitis right upper eyelid: Secondary | ICD-10-CM | POA: Diagnosis not present

## 2016-02-05 DIAGNOSIS — H04123 Dry eye syndrome of bilateral lacrimal glands: Secondary | ICD-10-CM | POA: Diagnosis not present

## 2016-02-05 DIAGNOSIS — H02831 Dermatochalasis of right upper eyelid: Secondary | ICD-10-CM | POA: Diagnosis not present

## 2016-04-23 NOTE — Progress Notes (Signed)
Patient ID: [name], female   DOB: [birth year], 83 y.o.   MRN: [id number]   83 y.o.  patient of Dr Verl Blalock History of MV surgery PAF and HTN. Had fatigue in past with higher dose beta blocker     Echo 12/03/11 Looked good I take care of her husband who has had an AVR  Study Conclusions  - Left ventricle: The estimated ejection fraction was 60%. Wall motion was normal; there were no regional wall motion abnormalities. Doppler parameters are consistent with high ventricular filling pressure. - Aortic valve: The valve appears to be grossly normal. Mild regurgitation. - Mitral valve: Mitral valve repair is working well. Valve area by pressure half-time: 2.27cm^2. - Left atrium: The atrium was mildly dilated. - Right atrium: The atrium was mildly dilated. - Pulmonary arteries: PA peak pressure: 79mm Hg (S).  Still driving Needs refill on BP meds  Has some varicose veins that look bad but don't hurt and no edema   Husband [name] past last year  of lung cancer They were married just short of 44 years He was one of my favorite patients   ROS: Denies fever, malais, weight loss, blurry vision, decreased visual acuity, cough, sputum, SOB, hemoptysis, pleuritic pain, palpitaitons, heartburn, abdominal pain, melena, lower extremity edema, claudication, or rash.  All other systems reviewed and negative  General: Affect appropriate Healthy:  appears stated age 83: normal Neck supple with no adenopathy JVP normal no bruits no thyromegaly Lungs clear with no wheezing and good diaphragmatic motion Heart:  S1/S2 no murmur, no rub, gallop or click PMI normal Abdomen: benighn, BS positve, no tenderness, no AAA no bruit.  No HSM or HJR Distal pulses intact with no bruits No edema  Varicose veins RLE greater than left  Neuro non-focal Skin warm and dry No muscular weakness   Current Outpatient Prescriptions  Medication Sig Dispense Refill  . Cholecalciferol (VITAMIN D3) 10000 UNITS capsule  Take 10,000 Units by mouth daily.      . fluticasone (FLONASE) 50 MCG/ACT nasal spray Place 2 sprays into both nostrils daily. 16 g 6  . hydrochlorothiazide (HYDRODIURIL) 25 MG tablet Take 1 tablet (25 mg total) by mouth daily. 90 tablet 3  . irbesartan (AVAPRO) 300 MG tablet Take 1 tablet (300 mg total) by mouth daily. 90 tablet 3  . Levothyroxine Sodium 25 MCG CAPS Take 1 capsule (25 mcg total) by mouth daily before breakfast. 30 capsule 11  . metoprolol (LOPRESSOR) 50 MG tablet Take 1 tablet (50 mg total) by mouth 2 (two) times daily. 180 tablet 3   Current Facility-Administered Medications  Medication Dose Route Frequency Provider Last Rate Last Dose  . levalbuterol (XOPENEX) nebulizer solution 1.25 mg  1.25 mg Nebulization Once Lysbeth Penner, FNP        Allergies  Aspirin-caffeine; Codeine; and Sulfonamide derivatives  Electrocardiogram:   8/15 SR rate 57 RBBB no change from 2014   01/29/15  SR rate 65  RBBB no changes  10/23/15  SR rate 64 RBBB   Assessment and Plan MV Repair:  SBE  No murmur good repair stable HTN: Well controlled.  Continue current medications and low sodium Dash type diet.   Thyroid:  Continue current replacement dose consider increasing by 25ug Lab Results  Component Value Date   TSH 4.510 (H) 03/20/2014   PAF:  Maint NSR no palpitations ECG looks fine no change from 2015   Varicose Veins:  Only painful on long trips encouraged her to wear support  hose  Depression:  Husband [name] died of lung cancer   has good family support Son [name].  lives next door and daughter with her today   [name]

## 2016-05-01 ENCOUNTER — Encounter: Payer: Self-pay | Admitting: Cardiovascular Disease

## 2016-05-01 ENCOUNTER — Ambulatory Visit (INDEPENDENT_AMBULATORY_CARE_PROVIDER_SITE_OTHER): Payer: Medicare Other | Admitting: Cardiovascular Disease

## 2016-05-01 VITALS — BP 150/80 | HR 79 | Ht 63.5 in | Wt 106.4 lb

## 2016-05-01 DIAGNOSIS — I340 Nonrheumatic mitral (valve) insufficiency: Secondary | ICD-10-CM | POA: Diagnosis not present

## 2016-05-01 NOTE — Patient Instructions (Signed)

## 2016-07-29 DIAGNOSIS — Z1231 Encounter for screening mammogram for malignant neoplasm of breast: Secondary | ICD-10-CM | POA: Diagnosis not present

## 2016-10-06 DIAGNOSIS — Z681 Body mass index (BMI) 19 or less, adult: Secondary | ICD-10-CM | POA: Diagnosis not present

## 2016-10-06 DIAGNOSIS — Z01419 Encounter for gynecological examination (general) (routine) without abnormal findings: Secondary | ICD-10-CM | POA: Diagnosis not present

## 2016-11-12 NOTE — Progress Notes (Signed)
Patient ID: [name], female   DOB: 11/19/1928, 83 y.o.   MRN: [id number]   83 y.o.  patient of Dr Verl Blalock History of MV surgery PAF and HTN. Had fatigue in past with higher dose beta blocker     Echo 12/03/11 Looked good I take care of her husband who has had an AVR  Study Conclusions  - Left ventricle: The estimated ejection fraction was 60%. Wall motion was normal; there were no regional wall motion abnormalities. Doppler parameters are consistent with high ventricular filling pressure. - Aortic valve: The valve appears to be grossly normal. Mild regurgitation. - Mitral valve: Mitral valve repair is working well. Valve area by pressure half-time: 2.27cm^2. - Left atrium: The atrium was mildly dilated. - Right atrium: The atrium was mildly dilated. - Pulmonary arteries: PA peak pressure: 13mm Hg (S).  Still driving Needs refill on BP meds  Has some varicose veins that look bad but don't hurt and no edema   Husband [name] past last year  of lung cancer They were married just short of 3 years He was one of my favorite patients   Son [name] lives on same road and two daughters one near [place] and one close by They do a good job checking on her   ROS: Denies fever, malais, weight loss, blurry vision, decreased visual acuity, cough, sputum, SOB, hemoptysis, pleuritic pain, palpitaitons, heartburn, abdominal pain, melena, lower extremity edema, claudication, or rash.  All other systems reviewed and negative  General: BP 136/80   Pulse 67   Ht 5' 3.5" (1.613 m)   Wt 105 lb (47.6 kg)   SpO2 95%   BMI 18.31 kg/m  Affect appropriate Healthy:  appears stated age 83: normal Neck supple with no adenopathy JVP normal no bruits no thyromegaly Lungs clear with no wheezing and good diaphragmatic motion Heart:  S1/S2 no murmur, no rub, gallop or click PMI normal Abdomen: benighn, BS positve, no tenderness, no AAA no bruit.  No HSM or HJR Distal pulses intact with no  bruits Varicose veins right greater than left with plus one edema  Neuro non-focal Skin warm and dry No muscular weakness     Current Outpatient Prescriptions  Medication Sig Dispense Refill  . Cholecalciferol (VITAMIN D3) 10000 UNITS capsule Take 10,000 Units by mouth daily.      . fluticasone (FLONASE) 50 MCG/ACT nasal spray Place 2 sprays into both nostrils daily. 16 g 6  . hydrochlorothiazide (HYDRODIURIL) 25 MG tablet Take 1 tablet (25 mg total) by mouth daily. 90 tablet 3  . irbesartan (AVAPRO) 300 MG tablet Take 1 tablet (300 mg total) by mouth daily. 90 tablet 3  . Levothyroxine Sodium 25 MCG CAPS Take 1 capsule (25 mcg total) by mouth daily before breakfast. 30 capsule 11  . metoprolol (LOPRESSOR) 50 MG tablet Take 1 tablet (50 mg total) by mouth 2 (two) times daily. 180 tablet 3   Current Facility-Administered Medications  Medication Dose Route Frequency Provider Last Rate Last Dose  . levalbuterol (XOPENEX) nebulizer solution 1.25 mg  1.25 mg Nebulization Once Lysbeth Penner, FNP        Allergies  Aspirin-caffeine; Codeine; and Sulfonamide derivatives  Electrocardiogram:   8/15 SR rate 57 RBBB no change from 2014   01/29/15  SR rate 65  RBBB no changes  10/23/15  SR rate 64 RBBB  11/17/16 SR ate 60 RBBB LAFB no changes   Assessment and Plan MV Repair:  SBE  No murmur last  echo 2013 will update  HTN: Well controlled.  Continue current medications and low sodium Dash type diet.   Thyroid:  Continue current replacement dose consider increasing by 25ug Lab Results  Component Value Date   TSH 4.510 (H) 03/20/2014   PAF:  Maint NSR no palpitations ECG looks fine no change from 2015   Varicose Veins:  Only painful on long trips encouraged her to wear support hose  Depression:  Husband [name] died of lung cancer   has good family support  [name]

## 2016-11-17 ENCOUNTER — Other Ambulatory Visit: Payer: Self-pay | Admitting: Cardiovascular Disease

## 2016-11-17 ENCOUNTER — Ambulatory Visit (INDEPENDENT_AMBULATORY_CARE_PROVIDER_SITE_OTHER): Payer: Medicare Other | Admitting: Cardiovascular Disease

## 2016-11-17 ENCOUNTER — Encounter: Payer: Self-pay | Admitting: Cardiovascular Disease

## 2016-11-17 VITALS — BP 136/80 | HR 67 | Ht 63.5 in | Wt 105.0 lb

## 2016-11-17 DIAGNOSIS — Z9889 Other specified postprocedural states: Secondary | ICD-10-CM

## 2016-11-17 DIAGNOSIS — I10 Essential (primary) hypertension: Secondary | ICD-10-CM | POA: Diagnosis not present

## 2016-11-17 MED ORDER — HYDROCHLOROTHIAZIDE 25 MG PO TABS: 25.0000 mg | ORAL_TABLET | Freq: Every day | ORAL | 3 refills | Status: DC

## 2016-11-17 NOTE — Patient Instructions (Addendum)

## 2016-11-18 NOTE — Addendum Note (Signed)
Addended by: Aris Georgia, Megan Presti L on: 11/18/2016 03:04 PM   Modules accepted: Orders

## 2017-01-06 ENCOUNTER — Ambulatory Visit (INDEPENDENT_AMBULATORY_CARE_PROVIDER_SITE_OTHER): Payer: Medicare Other | Admitting: Family

## 2017-01-06 ENCOUNTER — Encounter: Payer: Self-pay | Admitting: Family

## 2017-01-06 VITALS — BP 123/73 | HR 78 | Temp 97.3°F | Ht 63.5 in | Wt 102.6 lb

## 2017-01-06 DIAGNOSIS — I10 Essential (primary) hypertension: Secondary | ICD-10-CM | POA: Diagnosis not present

## 2017-01-06 DIAGNOSIS — R6889 Other general symptoms and signs: Secondary | ICD-10-CM | POA: Diagnosis not present

## 2017-01-06 DIAGNOSIS — R5383 Other fatigue: Secondary | ICD-10-CM | POA: Diagnosis not present

## 2017-01-06 DIAGNOSIS — E039 Hypothyroidism, unspecified: Secondary | ICD-10-CM | POA: Insufficient documentation

## 2017-01-06 DIAGNOSIS — Z23 Encounter for immunization: Secondary | ICD-10-CM | POA: Diagnosis not present

## 2017-01-06 DIAGNOSIS — F321 Major depressive disorder, single episode, moderate: Secondary | ICD-10-CM | POA: Diagnosis not present

## 2017-01-06 NOTE — Progress Notes (Signed)
Subjective:    Patient ID: [name], female    DOB: 06/01/1928, 83 y.o.   MRN: [id number]  PT presents to the office today with weakness, fatigue, and depression. Pt's son died last week. Pt has not has lab work in over 03/2014. Depression         This is a new problem.  The current episode started in the past 7 days.   The onset quality is gradual.   The problem occurs intermittently.  Associated symptoms include fatigue and sad.  Associated symptoms include no helplessness, no hopelessness, not irritable, no decreased interest and no headaches.     The symptoms are aggravated by family issues.  Past treatments include nothing.  Compliance with treatment is good.  Past medical history includes thyroid problem.   Thyroid Problem  Presents for follow-up visit. Symptoms include anxiety, fatigue, hoarse voice and palpitations. Patient reports no constipation, depressed mood or dry skin. The symptoms have been worsening.  Hypertension  This is a chronic problem. The current episode started more than 1 year ago. The problem has been resolved since onset. The problem is controlled. Associated symptoms include malaise/fatigue and palpitations. Pertinent negatives include no headaches, peripheral edema or shortness of breath. There is no history of kidney disease or CAD/MI. Identifiable causes of hypertension include a thyroid problem.      Review of Systems  Constitutional: Positive for fatigue and malaise/fatigue.  HENT: Positive for hoarse voice.   Respiratory: Negative for shortness of breath.   Cardiovascular: Positive for palpitations.  Gastrointestinal: Negative for constipation.  Neurological: Negative for headaches.  Psychiatric/Behavioral: Positive for depression. The patient is nervous/anxious.   All other systems reviewed and are negative.      Objective:   Physical Exam  Constitutional: She is oriented to person, place, and time. She appears well-developed and well-nourished.  She is not irritable. No distress.  HENT:  Head: Normocephalic and atraumatic.  Right Ear: External ear normal.  Left Ear: External ear normal.  Nose: Nose normal.  Mouth/Throat: Oropharynx is clear and moist.  Eyes: Pupils are equal, round, and reactive to light.  Neck: Normal range of motion. Neck supple. No thyromegaly present.  Cardiovascular: Normal rate, regular rhythm, normal heart sounds and intact distal pulses.   No murmur heard. Pulmonary/Chest: Effort normal and breath sounds normal. No respiratory distress. She has no wheezes.  Abdominal: Soft. Bowel sounds are normal. She exhibits no distension. There is no tenderness.  Musculoskeletal: Normal range of motion. She exhibits no edema or tenderness.  Neurological: She is alert and oriented to person, place, and time.  Skin: Skin is warm and dry.  Psychiatric: Her behavior is normal. Judgment and thought content normal. She exhibits a depressed mood.  Tearful  Vitals reviewed.     BP 123/73   Pulse 78   Temp (!) 97.3 F (36.3 C) (Oral)   Ht 5' 3.5" (1.613 m)   Wt 102 lb 9.6 oz (46.5 kg)   BMI 17.89 kg/m      Assessment & Plan:  1. Essential hypertension - CMP14+EGFR  2. Fatigue, unspecified type - Anemia Profile B - CMP14+EGFR - TSH  3. Hypothyroidism, unspecified type - CMP14+EGFR - TSH  4. Current moderate episode of major depressive disorder without prior episode [facility]) Pt's son just died last week  And is dealing with this loss. Pt does not want any medication at this time - CMP14+EGFR   Continue all meds Labs pending Health Maintenance reviewed Diet and  exercise encouraged RTO 2 months    , FNP  

## 2017-01-06 NOTE — Patient Instructions (Signed)

## 2017-01-07 DIAGNOSIS — Z0289 Encounter for other administrative examinations: Secondary | ICD-10-CM

## 2017-01-07 LAB — ANEMIA PROFILE B
BASOS: 0 %
Basophils Absolute: 0 10*3/uL (ref 0.0–0.2)
EOS (ABSOLUTE): 0 10*3/uL (ref 0.0–0.4)
EOS: 0 %
FERRITIN: 341 ng/mL — AB (ref 15–150)
Folate: 11.3 ng/mL (ref >3.0)
HEMATOCRIT: 36.7 % (ref 34.0–46.6)
HEMOGLOBIN: 12.5 g/dL (ref 11.1–15.9)
IMMATURE GRANS (ABS): 0 10*3/uL (ref 0.0–0.1)
IRON SATURATION: 8 % — AB (ref 15–55)
Immature Granulocytes: 0 %
Iron: 22 ug/dL — ABNORMAL LOW (ref 27–139)
LYMPHS ABS: 1.3 10*3/uL (ref 0.7–3.1)
Lymphs: 12 %
MCH: 31.4 pg (ref 26.6–33.0)
MCHC: 34.1 g/dL (ref 31.5–35.7)
MCV: 92 fL (ref 79–97)
MONOS ABS: 1 10*3/uL — AB (ref 0.1–0.9)
Monocytes: 9 %
NEUTROS ABS: 8.4 10*3/uL — AB (ref 1.4–7.0)
Neutrophils: 79 %
Platelets: 244 10*3/uL (ref 150–379)
RBC: 3.98 x10E6/uL (ref 3.77–5.28)
RDW: 13.9 % (ref 12.3–15.4)
Retic Ct Pct: 1.3 % (ref 0.6–2.6)
Total Iron Binding Capacity: 281 ug/dL (ref 250–450)
UIBC: 259 ug/dL (ref 118–369)
VITAMIN B 12: 211 pg/mL — AB (ref 232–1245)
WBC: 10.7 10*3/uL (ref 3.4–10.8)

## 2017-01-07 LAB — CMP14+EGFR
A/G RATIO: 1.6 (ref 1.2–2.2)
ALT: 8 IU/L (ref 0–32)
AST: 17 IU/L (ref 0–40)
Albumin: 4.2 g/dL (ref 3.5–4.7)
Alkaline Phosphatase: 69 IU/L (ref 39–117)
BUN/Creatinine Ratio: 23 (ref 12–28)
BUN: 31 mg/dL — ABNORMAL HIGH (ref 8–27)
Bilirubin Total: 1.5 mg/dL — ABNORMAL HIGH (ref 0.0–1.2)
CALCIUM: 9.8 mg/dL (ref 8.7–10.3)
CHLORIDE: 97 mmol/L (ref 96–106)
CO2: 24 mmol/L (ref 20–29)
Creatinine, Ser: 1.34 mg/dL — ABNORMAL HIGH (ref 0.57–1.00)
GFR, EST AFRICAN AMERICAN: 41 mL/min/{1.73_m2} — AB (ref >59)
GFR, EST NON AFRICAN AMERICAN: 36 mL/min/{1.73_m2} — AB (ref >59)
Globulin, Total: 2.7 g/dL (ref 1.5–4.5)
Glucose: 96 mg/dL (ref 65–99)
POTASSIUM: 3.8 mmol/L (ref 3.5–5.2)
SODIUM: 137 mmol/L (ref 134–144)
Total Protein: 6.9 g/dL (ref 6.0–8.5)

## 2017-01-07 LAB — TSH: TSH: 1.56 u[IU]/mL (ref 0.450–4.500)

## 2017-01-08 ENCOUNTER — Other Ambulatory Visit: Payer: Self-pay | Admitting: Family

## 2017-01-08 DIAGNOSIS — D509 Iron deficiency anemia, unspecified: Secondary | ICD-10-CM

## 2017-01-12 ENCOUNTER — Other Ambulatory Visit (HOSPITAL_COMMUNITY): Payer: Self-pay | Admitting: Oncology

## 2017-01-12 ENCOUNTER — Encounter (HOSPITAL_COMMUNITY): Payer: Self-pay | Admitting: Oncology

## 2017-01-12 ENCOUNTER — Other Ambulatory Visit: Payer: Self-pay | Admitting: *Deleted

## 2017-01-12 ENCOUNTER — Encounter (HOSPITAL_COMMUNITY): Payer: Medicare Other

## 2017-01-12 ENCOUNTER — Encounter (HOSPITAL_COMMUNITY): Payer: Medicare Other | Attending: Oncology | Admitting: Oncology

## 2017-01-12 DIAGNOSIS — D500 Iron deficiency anemia secondary to blood loss (chronic): Secondary | ICD-10-CM | POA: Insufficient documentation

## 2017-01-12 DIAGNOSIS — E611 Iron deficiency: Secondary | ICD-10-CM

## 2017-01-12 LAB — RETICULOCYTES
RBC.: 3.76 MIL/uL — AB (ref 3.87–5.11)
RETIC COUNT ABSOLUTE: 37.6 10*3/uL (ref 19.0–186.0)
RETIC CT PCT: 1 % (ref 0.4–3.1)

## 2017-01-12 LAB — LACTATE DEHYDROGENASE: LDH: 140 U/L (ref 98–192)

## 2017-01-12 LAB — IRON AND TIBC
IRON: 56 ug/dL (ref 28–170)
Saturation Ratios: 19 % (ref 10.4–31.8)
TIBC: 300 ug/dL (ref 250–450)
UIBC: 244 ug/dL

## 2017-01-12 LAB — FERRITIN: Ferritin: 227 ng/mL (ref 11–307)

## 2017-01-12 MED ORDER — HYDROXYZINE HCL 25 MG PO TABS: 25.0000 mg | ORAL_TABLET | Freq: Four times a day (QID) | ORAL | 1 refills | Status: DC | PRN

## 2017-01-12 MED ORDER — FERROUS SULFATE 325 (65 FE) MG PO TBEC: 325.0000 mg | DELAYED_RELEASE_TABLET | ORAL | 3 refills | Status: DC

## 2017-01-12 NOTE — Progress Notes (Signed)
[facility] Initial Visit:  Patient Care Team: [name], MD as PCP - General (Family Medicine) Chipper Herb, MD (Family Medicine)  CHIEF COMPLAINTS/PURPOSE OF CONSULTATION:   No history exists.    HISTORY OF PRESENTING ILLNESS: [name] 83 y.o. female is here because of  low iron level.  Patient is somewhat feeling weak and tired recently but recently patient has lost her son. Has not seen any blood in stool or urine. Had colonoscopy several years ago.  Patient has mitral valve insufficiency being evaluated by cardiologist on a regular basis.  Getting regular mammogram done.  Has not lost any weight.  Review of Systems - Oncology GENERAL:  Feels weak and tired .  Recently depressed because of loss of her son.  No fevers, sweats or weight loss. PERFORMANCE STATUS (ECOG):  1 HEENT:  No visual changes, runny nose, sore throat, mouth sores or tenderness. Lungs: No shortness of breath or cough.  No hemoptysis. Cardiac:  No chest pain, palpitations, orthopnea, or PND. GI:  No nausea, vomiting, diarrhea, constipation, melena or hematochezia. GU:  No urgency, frequency, dysuria, or hematuria. Musculoskeletal:  No back pain.  No joint pain.  No muscle tenderness. Extremities:  No pain or swelling. Skin:  No rashes or skin changes. Neuro:  No headache, numbness or weakness, balance or coordination issues. Endocrine:  No diabetes, thyroid issues, hot flashes or night sweats. Psych:  No mood changes, depression or anxiety. Pain:  No focal pain. Review of systems:  All other systems reviewed and found to be negative. MEDICAL HISTORY: Past Medical History:  Diagnosis Date  . Arthritis   . Atrial fibrillation ([place])    postoperative  . CVA (cerebral vascular accident) ([place])   . HTN (hypertension)   . MR (mitral regurgitation)    severe  . Pericarditis    postoperative    SURGICAL HISTORY: Past Surgical History:  Procedure Laterality Date  . ANTERIOR  AND POSTERIOR VAGINAL REPAIR  10/15/04   Dr. Nori Riis   . anterior repair with perigee graft  04/16/06   posterior repair with apogee graft; lynx mid urethral sling; sacrospinous ligamnet suspension and cystoscopy; Dr. Joya Martyr  . MITRAL VALVE REPLACEMENT    . sacral spinous ligament suspension of vagina    . suprapubic cystectomy    . TOTAL ABDOMINAL HYSTERECTOMY W/ BILATERAL SALPINGOOPHORECTOMY      SOCIAL HISTORY: Social History   Socioeconomic History  . Marital status: Married    Spouse name: Not on file  . Number of children: Not on file  . Years of education: Not on file  . Highest education level: Not on file  Social Needs  . Financial resource strain: Not on file  . Food insecurity - worry: Not on file  . Food insecurity - inability: Not on file  . Transportation needs - medical: Not on file  . Transportation needs - non-medical: Not on file  Occupational History  . Not on file  Tobacco Use  . Smoking status: Never Smoker  . Smokeless tobacco: Never Used  Substance and Sexual Activity  . Alcohol use: No  . Drug use: No  . Sexual activity: Not on file  Other Topics Concern  . Not on file  Social History Narrative   Married, retired.     FAMILY HISTORY Family History  Problem Relation Age of Onset  . Heart disease Father   . Lupus Father   . Heart attack Father   . Heart disease Mother   .  Colon cancer Brother     ALLERGIES:  is allergic to aspirin-caffeine; codeine; and sulfonamide derivatives.  MEDICATIONS:  Current Outpatient Medications  Medication Sig Dispense Refill  . Cholecalciferol (VITAMIN D3) 10000 UNITS capsule Take 10,000 Units by mouth daily.      . hydrochlorothiazide (HYDRODIURIL) 25 MG tablet Take 1 tablet (25 mg total) by mouth daily. 90 tablet 3  . irbesartan (AVAPRO) 300 MG tablet TAKE ONE TABLET BY MOUTH ONCE DAILY 90 tablet 3  . metoprolol (LOPRESSOR) 50 MG tablet Take 1 tablet (50 mg total) by mouth 2 (two) times daily. 180 tablet 3  .  fluticasone (FLONASE) 50 MCG/ACT nasal spray Place 2 sprays into both nostrils daily. (Patient not taking: Reported on 01/12/2017) 16 g 6  . Levothyroxine Sodium 25 MCG CAPS Take 1 capsule (25 mcg total) by mouth daily before breakfast. (Patient not taking: Reported on 01/12/2017) 30 capsule 11   Current Facility-Administered Medications  Medication Dose Route Frequency Provider Last Rate Last Dose  . levalbuterol (XOPENEX) nebulizer solution 1.25 mg  1.25 mg Nebulization Once Lysbeth Penner, FNP        PHYSICAL EXAMINATION:  ECOG PERFORMANCE STATUS: 1 - Symptomatic but completely ambulatory   Vitals:   01/12/17 0921  BP: (!) 148/51  Pulse: 63  Resp: 16  Temp: 98 F (36.7 C)    Filed Weights   01/12/17 0921  Weight: 101 lb 6.4 oz (46 kg)     Physical Exam  GENERAL:  Well developed, well nourished, sitting comfortably in the exam room in no acute distress. MENTAL STATUS:  Alert and oriented to person, place and time. HEAD:  .  Normocephalic, atraumatic, face symmetric, no Cushingoid features. EYES: .  Pupils equal round and reactive to light and accomodation.  No conjunctivitis or scleral icterus. ENT:  Oropharynx clear without lesion.  Tongue normal. Mucous membranes moist.  RESPIRATORY:  Clear to auscultation without rales, wheezes or rhonchi. CARDIOVASCULAR:  Regular rate and rhythm without murmur, rub or gallop.  ABDOMEN:  Soft, non-tender, with active bowel sounds, and no hepatosplenomegaly.  No masses. BACK:  No CVA tenderness.  No tenderness on percussion of the back or rib cage. SKIN:  No rashes, ulcers or lesions. EXTREMITIES: No edema, no skin discoloration or tenderness.  No palpable cords. LYMPH NODES: No palpable cervical, supraclavicular, axillary or inguinal adenopathy  NEUROLOGICAL: Unremarkable. PSYCH:  Appropriate. LABORATORY DATA: I have personally reviewed the data as listed:  Office Visit on 01/06/2017  Component Date Value Ref Range Status  .  Total Iron Binding Capacity 01/06/2017 281  250 - 450 ug/dL Final  . UIBC 01/06/2017 259  118 - 369 ug/dL Final  . Iron 01/06/2017 22* 27 - 139 ug/dL Final  . Iron Saturation 01/06/2017 8* 15 - 55 % Final  . Ferritin 01/06/2017 341* 15 - 150 ng/mL Final  . Vitamin B-12 01/06/2017 211* 232 - 1,245 pg/mL Final  . Folate 01/06/2017 11.3  >3.0 ng/mL Final   Comment: A serum folate concentration of less than 3.1 ng/mL is considered to represent clinical deficiency.   . WBC 01/06/2017 10.7  3.4 - 10.8 x10E3/uL Final  . RBC 01/06/2017 3.98  3.77 - 5.28 x10E6/uL Final  . Hemoglobin 01/06/2017 12.5  11.1 - 15.9 g/dL Final  . Hematocrit 01/06/2017 36.7  34.0 - 46.6 % Final  . MCV 01/06/2017 92  79 - 97 fL Final  . MCH 01/06/2017 31.4  26.6 - 33.0 pg Final  . MCHC 01/06/2017 34.1  31.5 - 35.7 g/dL Final  . RDW 01/06/2017 13.9  12.3 - 15.4 % Final  . Platelets 01/06/2017 244  150 - 379 x10E3/uL Final  . Neutrophils 01/06/2017 79  Not Estab. % Final  . Lymphs 01/06/2017 12  Not Estab. % Final  . Monocytes 01/06/2017 9  Not Estab. % Final  . Eos 01/06/2017 0  Not Estab. % Final  . Basos 01/06/2017 0  Not Estab. % Final  . Neutrophils Absolute 01/06/2017 8.4* 1.4 - 7.0 x10E3/uL Final  . Lymphocytes Absolute 01/06/2017 1.3  0.7 - 3.1 x10E3/uL Final  . Monocytes Absolute 01/06/2017 1.0* 0.1 - 0.9 x10E3/uL Final  . EOS (ABSOLUTE) 01/06/2017 0.0  0.0 - 0.4 x10E3/uL Final  . Basophils Absolute 01/06/2017 0.0  0.0 - 0.2 x10E3/uL Final  . Immature Granulocytes 01/06/2017 0  Not Estab. % Final  . Immature Grans (Abs) 01/06/2017 0.0  0.0 - 0.1 x10E3/uL Final  . Retic Ct Pct 01/06/2017 1.3  0.6 - 2.6 % Final  . Glucose 01/06/2017 96  65 - 99 mg/dL Final  . BUN 01/06/2017 31* 8 - 27 mg/dL Final  . Creatinine, Ser 01/06/2017 1.34* 0.57 - 1.00 mg/dL Final  . GFR calc non Af Amer 01/06/2017 36* >59 mL/min/1.73 Final  . GFR calc Af Amer 01/06/2017 41* >59 mL/min/1.73 Final  . BUN/Creatinine Ratio 01/06/2017  23  12 - 28 Final  . Sodium 01/06/2017 137  134 - 144 mmol/L Final  . Potassium 01/06/2017 3.8  3.5 - 5.2 mmol/L Final  . Chloride 01/06/2017 97  96 - 106 mmol/L Final  . CO2 01/06/2017 24  20 - 29 mmol/L Final  . Calcium 01/06/2017 9.8  8.7 - 10.3 mg/dL Final  . Total Protein 01/06/2017 6.9  6.0 - 8.5 g/dL Final  . Albumin 01/06/2017 4.2  3.5 - 4.7 g/dL Final  . Globulin, Total 01/06/2017 2.7  1.5 - 4.5 g/dL Final  . Albumin/Globulin Ratio 01/06/2017 1.6  1.2 - 2.2 Final  . Bilirubin Total 01/06/2017 1.5* 0.0 - 1.2 mg/dL Final  . Alkaline Phosphatase 01/06/2017 69  39 - 117 IU/L Final  . AST 01/06/2017 17  0 - 40 IU/L Final  . ALT 01/06/2017 8  0 - 32 IU/L Final  . TSH 01/06/2017 1.560  0.450 - 4.500 uIU/mL Final    RADIOGRAPHIC STUDIES: I have personally reviewed the radiological images as listed and agree with the findings in the report  No results found.  ASSESSMENT/PLAN Low iron level without any significant evidence of anemia. Colonoscopy several years ago. Will check stool for occult blood if it is positive colonoscopy and upper endoscopy may be recommended To try oral iron in the patient does not tolerate and if hemoglobin gradually drops then possibility of intravenous iron can be considered. Reticulocyte count LDH to rule out any hemolytic anemia or hemolytic component would be considered as bilirubin is slightly elevated. Likely elevated serum creatinine may suggest that anemia might be partly related to chronic renal insufficiency This note was electronically signed.    Forest Gleason, MD  01/12/2017 9:30 AM

## 2017-01-15 ENCOUNTER — Telehealth: Payer: Self-pay | Admitting: Family

## 2017-01-15 ENCOUNTER — Other Ambulatory Visit: Payer: Self-pay | Admitting: Family

## 2017-01-15 MED ORDER — ESCITALOPRAM OXALATE 10 MG PO TABS: 10.0000 mg | ORAL_TABLET | Freq: Every day | ORAL | 3 refills | Status: DC

## 2017-01-15 NOTE — Telephone Encounter (Signed)
Lexapro 10 mg Prescription sent to pharmacy, follow up in 6 weeks

## 2017-01-15 NOTE — Telephone Encounter (Signed)
Daughter aware.

## 2017-02-20 ENCOUNTER — Other Ambulatory Visit (HOSPITAL_COMMUNITY): Payer: Self-pay | Admitting: *Deleted

## 2017-02-20 DIAGNOSIS — D509 Iron deficiency anemia, unspecified: Secondary | ICD-10-CM

## 2017-02-23 ENCOUNTER — Other Ambulatory Visit: Payer: Self-pay

## 2017-02-23 ENCOUNTER — Encounter (HOSPITAL_COMMUNITY): Payer: Medicare Other | Attending: Oncology | Admitting: Oncology

## 2017-02-23 ENCOUNTER — Encounter (HOSPITAL_COMMUNITY): Payer: Self-pay | Admitting: Oncology

## 2017-02-23 ENCOUNTER — Encounter (HOSPITAL_COMMUNITY): Payer: Medicare Other

## 2017-02-23 VITALS — BP 132/60 | HR 72 | Temp 97.7°F | Resp 16 | Ht 63.5 in | Wt 100.0 lb

## 2017-02-23 DIAGNOSIS — D500 Iron deficiency anemia secondary to blood loss (chronic): Secondary | ICD-10-CM

## 2017-02-23 DIAGNOSIS — D509 Iron deficiency anemia, unspecified: Secondary | ICD-10-CM

## 2017-02-23 DIAGNOSIS — K922 Gastrointestinal hemorrhage, unspecified: Secondary | ICD-10-CM | POA: Diagnosis not present

## 2017-02-23 LAB — COMPREHENSIVE METABOLIC PANEL
ALBUMIN: 3.8 g/dL (ref 3.5–5.0)
ALK PHOS: 62 U/L (ref 38–126)
ALT: 14 U/L (ref 14–54)
AST: 21 U/L (ref 15–41)
Anion gap: 6 (ref 5–15)
BILIRUBIN TOTAL: 1.1 mg/dL (ref 0.3–1.2)
BUN: 31 mg/dL — AB (ref 6–20)
CALCIUM: 9.7 mg/dL (ref 8.9–10.3)
CO2: 29 mmol/L (ref 22–32)
Chloride: 104 mmol/L (ref 101–111)
Creatinine, Ser: 1.25 mg/dL — ABNORMAL HIGH (ref 0.44–1.00)
GFR calc Af Amer: 44 mL/min — ABNORMAL LOW (ref >60)
GFR calc non Af Amer: 38 mL/min — ABNORMAL LOW (ref >60)
GLUCOSE: 102 mg/dL — AB (ref 65–99)
POTASSIUM: 3.7 mmol/L (ref 3.5–5.1)
Sodium: 139 mmol/L (ref 135–145)
TOTAL PROTEIN: 7.2 g/dL (ref 6.5–8.1)

## 2017-02-23 LAB — RETICULOCYTES
RBC.: 3.94 MIL/uL (ref 3.87–5.11)
RETIC COUNT ABSOLUTE: 35.5 10*3/uL (ref 19.0–186.0)
Retic Ct Pct: 0.9 % (ref 0.4–3.1)

## 2017-02-23 LAB — CBC WITH DIFFERENTIAL/PLATELET
BASOS ABS: 0 10*3/uL (ref 0.0–0.1)
BASOS PCT: 1 %
Eosinophils Absolute: 0.2 10*3/uL (ref 0.0–0.7)
Eosinophils Relative: 4 %
HEMATOCRIT: 37.2 % (ref 36.0–46.0)
HEMOGLOBIN: 12 g/dL (ref 12.0–15.0)
Lymphocytes Relative: 17 %
Lymphs Abs: 1.1 10*3/uL (ref 0.7–4.0)
MCH: 30.5 pg (ref 26.0–34.0)
MCHC: 32.3 g/dL (ref 30.0–36.0)
MCV: 94.4 fL (ref 78.0–100.0)
Monocytes Absolute: 0.5 10*3/uL (ref 0.1–1.0)
Monocytes Relative: 7 %
NEUTROS ABS: 4.8 10*3/uL (ref 1.7–7.7)
NEUTROS PCT: 71 %
Platelets: 249 10*3/uL (ref 150–400)
RBC: 3.94 MIL/uL (ref 3.87–5.11)
RDW: 13.6 % (ref 11.5–15.5)
WBC: 6.6 10*3/uL (ref 4.0–10.5)

## 2017-02-23 LAB — FERRITIN: Ferritin: 218 ng/mL (ref 11–307)

## 2017-02-23 LAB — IRON AND TIBC
Iron: 94 ug/dL (ref 28–170)
SATURATION RATIOS: 28 % (ref 10.4–31.8)
TIBC: 339 ug/dL (ref 250–450)
UIBC: 245 ug/dL

## 2017-02-23 LAB — LACTATE DEHYDROGENASE: LDH: 147 U/L (ref 98–192)

## 2017-02-23 NOTE — Progress Notes (Signed)
[facility] Follow up:    [name], MD [place] [address]   DIAGNOSIS: Iron deficiency anemia  CURRENT THERAPY: Oral ferrous sulfate 325 mg PO Daily  INTERVAL HISTORY: [name] 83 y.o. female presents today for continued follow-up of her iron deficiency anemia.  She states that she has been tolerating ferrous sulfate 325 mg p.o. daily after supper well.  She states that there was a period for 2 weeks which she did not take it because she was unsure whether she was supposed to continue taking her iron tablets, but she states that she is resumed taking it.  She denies any GI upset or constipation from her iron tablet.  She states that she is also been eating chicken livers to boost up her iron level.  Her hemoglobin is 12.8 g/dL today.  She denies any bleeding including melena, hematochezia, hematuria, hemoptysis.   Patient Active Problem List   Diagnosis Date Noted  . Iron deficiency anemia due to chronic blood loss 01/12/2017  . Hypothyroidism 01/06/2017  . RBBB 10/12/2013  . Varicose veins 10/12/2013  . Fatigue 11/06/2011  . Orthostatic hypotension 11/06/2011  . Mitral valve failure 10/09/2010  . PULMONARY HYPERTENSION, MILD 09/26/2009  . DIVERTICULOSIS, COLON 09/26/2009  . TRANSIENT ISCHEMIC ATTACKS, HX OF 09/26/2009  . COLONIC POLYPS, ADENOMATOUS, HX OF 09/26/2009  . ISCHEMIC COLITIS, HX OF 09/26/2009  . Essential hypertension 08/26/2008    is allergic to aspirin-caffeine; codeine; and sulfonamide derivatives.  MEDICAL HISTORY: Past Medical History:  Diagnosis Date  . Arthritis   . Atrial fibrillation ([place])    postoperative  . CVA (cerebral vascular accident) ([place])   . HTN (hypertension)   . MR (mitral regurgitation)    severe  . Pericarditis    postoperative    SURGICAL HISTORY: Past Surgical History:  Procedure Laterality Date  . ANTERIOR AND POSTERIOR VAGINAL REPAIR  10/15/04   Dr. Nori Riis   . anterior repair with  perigee graft  04/16/06   posterior repair with apogee graft; lynx mid urethral sling; sacrospinous ligamnet suspension and cystoscopy; Dr. Joya Martyr  . MITRAL VALVE REPLACEMENT    . sacral spinous ligament suspension of vagina    . suprapubic cystectomy    . TOTAL ABDOMINAL HYSTERECTOMY W/ BILATERAL SALPINGOOPHORECTOMY      SOCIAL HISTORY: Social History   Socioeconomic History  . Marital status: Married    Spouse name: Not on file  . Number of children: Not on file  . Years of education: Not on file  . Highest education level: Not on file  Social Needs  . Financial resource strain: Not on file  . Food insecurity - worry: Not on file  . Food insecurity - inability: Not on file  . Transportation needs - medical: Not on file  . Transportation needs - non-medical: Not on file  Occupational History  . Not on file  Tobacco Use  . Smoking status: Never Smoker  . Smokeless tobacco: Never Used  Substance and Sexual Activity  . Alcohol use: No  . Drug use: No  . Sexual activity: Not on file  Other Topics Concern  . Not on file  Social History Narrative   Married, retired.     FAMILY HISTORY: Family History  Problem Relation Age of Onset  . Heart disease Father   . Lupus Father   . Heart attack Father   . Heart disease Mother   . Colon cancer Brother     Review  of Systems - Oncology  ROS as per HPI otherwise 12 point ROS is negative.   PHYSICAL EXAMINATION   Vitals:   02/23/17 0900  BP: 132/60  Pulse: 72  Resp: 16  Temp: 97.7 F (36.5 C)  SpO2: 100%    Physical Exam Constitutional: Well-developed, well-nourished, and in no distress.   HENT:  Head: Normocephalic and atraumatic.  Mouth/Throat: No oropharyngeal exudate. Mucosa moist. Eyes: Pupils are equal, round, and reactive to light. Conjunctivae are normal. No scleral icterus.  Neck: Normal range of motion. Neck supple. No JVD present.  Cardiovascular: Normal rate, regular rhythm and normal heart sounds.   Exam reveals no gallop and no friction rub.   No murmur heard. Pulmonary/Chest: Effort normal and breath sounds normal. No respiratory distress. No wheezes.No rales.  Abdominal: Soft. Bowel sounds are normal. No distension. There is no tenderness. There is no guarding.  Musculoskeletal: No edema or tenderness.  Lymphadenopathy:    No cervical or supraclavicular adenopathy.  Neurological: Alert and oriented to person, place, and time. No cranial nerve deficit.  Skin: Skin is warm and dry. No rash noted. No erythema. No pallor.  Psychiatric: Affect and judgment normal.    LABORATORY DATA:  CBC    Component Value Date/Time   WBC 6.6 02/23/2017 0903   RBC 3.94 02/23/2017 0903   RBC 3.94 02/23/2017 0903   HGB 12.0 02/23/2017 0903   HGB 12.5 01/06/2017 0947   HCT 37.2 02/23/2017 0903   HCT 36.7 01/06/2017 0947   PLT 249 02/23/2017 0903   PLT 244 01/06/2017 0947   MCV 94.4 02/23/2017 0903   MCV 92 01/06/2017 0947   MCH 30.5 02/23/2017 0903   MCHC 32.3 02/23/2017 0903   RDW 13.6 02/23/2017 0903   RDW 13.9 01/06/2017 0947   LYMPHSABS 1.1 02/23/2017 0903   LYMPHSABS 1.3 01/06/2017 0947   MONOABS 0.5 02/23/2017 0903   EOSABS 0.2 02/23/2017 0903   EOSABS 0.0 01/06/2017 0947   BASOSABS 0.0 02/23/2017 0903   BASOSABS 0.0 01/06/2017 0947    CMP     Component Value Date/Time   NA 139 02/23/2017 0903   NA 137 01/06/2017 0947   K 3.7 02/23/2017 0903   CL 104 02/23/2017 0903   CO2 29 02/23/2017 0903   GLUCOSE 102 (H) 02/23/2017 0903   BUN 31 (H) 02/23/2017 0903   BUN 31 (H) 01/06/2017 0947   CREATININE 1.25 (H) 02/23/2017 0903   CALCIUM 9.7 02/23/2017 0903   PROT 7.2 02/23/2017 0903   PROT 6.9 01/06/2017 0947   ALBUMIN 3.8 02/23/2017 0903   ALBUMIN 4.2 01/06/2017 0947   AST 21 02/23/2017 0903   ALT 14 02/23/2017 0903   ALKPHOS 62 02/23/2017 0903   BILITOT 1.1 02/23/2017 0903   BILITOT 1.5 (H) 01/06/2017 0947   GFRNONAA 38 (L) 02/23/2017 0903   GFRAA 44 (L) 02/23/2017 0903          ASSESSMENT and THERAPY PLAN:  -I have reviewed patient CBC in detail with her today.  Hemoglobin is stable at 12 g/dL. -Continue ferrous sulfate 325 mg p.o. daily and nutritional supplement with foods high in iron.  Her iron studies are pending today, will follow up on her results and call her with them.  She is tolerating her oral iron supplement well without any side effects. -Return to clinic in 73months for follow-up with labs CBC, CMP, iron studies.  Orders Placed This Encounter  Procedures  . CBC with Differential    Standing Status:  Future    Standing Expiration Date:   02/23/2018  . Comprehensive metabolic panel    Standing Status:   Future    Standing Expiration Date:   02/23/2018  . Ferritin    Standing Status:   Future    Standing Expiration Date:   02/23/2018  . Iron and TIBC    Standing Status:   Future    Standing Expiration Date:   02/23/2018    All questions were answered. The patient knows to call the clinic with any problems, questions or concerns. We can certainly see the patient much sooner if necessary.  This note was electronically signed. Twana First, MD 02/23/2017

## 2017-03-04 ENCOUNTER — Other Ambulatory Visit: Payer: Self-pay | Admitting: Cardiovascular Disease

## 2017-04-06 ENCOUNTER — Telehealth: Payer: Self-pay | Admitting: Cardiovascular Disease

## 2017-04-06 NOTE — Telephone Encounter (Signed)
Pt c/o medication issue:  1. Name of Medication: Irbesartan   2. How are you currently taking this medication (dosage and times per day)? 300 mg, 1 tablet daily   3. Are you having a reaction (difficulty breathing--STAT)? no  4. What is your medication issue? Being recalled

## 2017-04-06 NOTE — Telephone Encounter (Signed)
Called patient and informed her to contact her pharmacy. Informed patient if pharmacy does not have another medication from a different [facility], that they can send our office a request for an alternative medication. Patient verbalized understanding and she will call her pharmacy.

## 2017-05-21 ENCOUNTER — Other Ambulatory Visit (HOSPITAL_COMMUNITY): Payer: Self-pay

## 2017-05-21 DIAGNOSIS — D500 Iron deficiency anemia secondary to blood loss (chronic): Secondary | ICD-10-CM

## 2017-05-25 ENCOUNTER — Inpatient Hospital Stay (HOSPITAL_COMMUNITY): Payer: Medicare Other | Attending: Internal Medicine

## 2017-05-25 DIAGNOSIS — Z952 Presence of prosthetic heart valve: Secondary | ICD-10-CM | POA: Diagnosis not present

## 2017-05-25 DIAGNOSIS — I10 Essential (primary) hypertension: Secondary | ICD-10-CM | POA: Diagnosis not present

## 2017-05-25 DIAGNOSIS — I4891 Unspecified atrial fibrillation: Secondary | ICD-10-CM | POA: Insufficient documentation

## 2017-05-25 DIAGNOSIS — M1990 Unspecified osteoarthritis, unspecified site: Secondary | ICD-10-CM | POA: Insufficient documentation

## 2017-05-25 DIAGNOSIS — E039 Hypothyroidism, unspecified: Secondary | ICD-10-CM | POA: Insufficient documentation

## 2017-05-25 DIAGNOSIS — Z79899 Other long term (current) drug therapy: Secondary | ICD-10-CM | POA: Diagnosis not present

## 2017-05-25 DIAGNOSIS — Z882 Allergy status to sulfonamides status: Secondary | ICD-10-CM | POA: Insufficient documentation

## 2017-05-25 DIAGNOSIS — D500 Iron deficiency anemia secondary to blood loss (chronic): Secondary | ICD-10-CM

## 2017-05-25 DIAGNOSIS — Z885 Allergy status to narcotic agent status: Secondary | ICD-10-CM | POA: Insufficient documentation

## 2017-05-25 DIAGNOSIS — J029 Acute pharyngitis, unspecified: Secondary | ICD-10-CM | POA: Insufficient documentation

## 2017-05-25 DIAGNOSIS — D509 Iron deficiency anemia, unspecified: Secondary | ICD-10-CM | POA: Insufficient documentation

## 2017-05-25 DIAGNOSIS — Z800 Family history of malignant neoplasm of digestive organs: Secondary | ICD-10-CM | POA: Insufficient documentation

## 2017-05-25 LAB — CBC WITH DIFFERENTIAL/PLATELET
BASOS ABS: 0 10*3/uL (ref 0.0–0.1)
BASOS PCT: 0 %
EOS ABS: 0.2 10*3/uL (ref 0.0–0.7)
Eosinophils Relative: 2 %
HCT: 39.3 % (ref 36.0–46.0)
HEMOGLOBIN: 12.8 g/dL (ref 12.0–15.0)
LYMPHS ABS: 1.6 10*3/uL (ref 0.7–4.0)
Lymphocytes Relative: 19 %
MCH: 30.8 pg (ref 26.0–34.0)
MCHC: 32.6 g/dL (ref 30.0–36.0)
MCV: 94.7 fL (ref 78.0–100.0)
Monocytes Absolute: 0.5 10*3/uL (ref 0.1–1.0)
Monocytes Relative: 7 %
NEUTROS PCT: 72 %
Neutro Abs: 6 10*3/uL (ref 1.7–7.7)
Platelets: 225 10*3/uL (ref 150–400)
RBC: 4.15 MIL/uL (ref 3.87–5.11)
RDW: 13.6 % (ref 11.5–15.5)
WBC: 8.3 10*3/uL (ref 4.0–10.5)

## 2017-05-25 LAB — COMPREHENSIVE METABOLIC PANEL
ALBUMIN: 4.2 g/dL (ref 3.5–5.0)
ALK PHOS: 59 U/L (ref 38–126)
ALT: 14 U/L (ref 14–54)
AST: 22 U/L (ref 15–41)
Anion gap: 11 (ref 5–15)
BUN: 26 mg/dL — ABNORMAL HIGH (ref 6–20)
CALCIUM: 10.2 mg/dL (ref 8.9–10.3)
CHLORIDE: 102 mmol/L (ref 101–111)
CO2: 27 mmol/L (ref 22–32)
CREATININE: 1.15 mg/dL — AB (ref 0.44–1.00)
GFR calc Af Amer: 48 mL/min — ABNORMAL LOW (ref >60)
GFR calc non Af Amer: 42 mL/min — ABNORMAL LOW (ref >60)
GLUCOSE: 92 mg/dL (ref 65–99)
Potassium: 4.2 mmol/L (ref 3.5–5.1)
SODIUM: 140 mmol/L (ref 135–145)
Total Bilirubin: 1.1 mg/dL (ref 0.3–1.2)
Total Protein: 7.4 g/dL (ref 6.5–8.1)

## 2017-05-25 LAB — IRON AND TIBC
Iron: 91 ug/dL (ref 28–170)
Saturation Ratios: 27 % (ref 10.4–31.8)
TIBC: 343 ug/dL (ref 250–450)
UIBC: 252 ug/dL

## 2017-05-25 LAB — FERRITIN: Ferritin: 239 ng/mL (ref 11–307)

## 2017-05-26 ENCOUNTER — Telehealth: Payer: Self-pay | Admitting: *Deleted

## 2017-05-26 MED ORDER — LISINOPRIL 40 MG PO TABS: 40.0000 mg | ORAL_TABLET | Freq: Every day | ORAL | 11 refills | Status: DC

## 2017-05-26 NOTE — Telephone Encounter (Signed)
Do not see that patient has tried an ACEi in the past. Would recommend change to ACEi due to backorders and recalls with ARBs. Would recommend lisinopril 40mg  daily. Monitor pressures for 3-4 weeks and call with changes.

## 2017-05-26 NOTE — Telephone Encounter (Signed)
Received fax from [facility] requesting an alternative for the irbesartan as it is on backorder. Please advise. Thanks, MI

## 2017-05-26 NOTE — Telephone Encounter (Signed)
Called patient with changes for now. Patient will start lisinopril since irbesartan is on backorder. Patient will monitor BP for 3 to 4 weeks and will call with any changes. Patient verbalized understanding and will call with any other questions.

## 2017-06-01 ENCOUNTER — Inpatient Hospital Stay (HOSPITAL_BASED_OUTPATIENT_CLINIC_OR_DEPARTMENT_OTHER): Payer: Medicare Other | Admitting: Internal Medicine

## 2017-06-01 ENCOUNTER — Encounter (HOSPITAL_COMMUNITY): Payer: Self-pay | Admitting: Internal Medicine

## 2017-06-01 ENCOUNTER — Other Ambulatory Visit: Payer: Self-pay

## 2017-06-01 VITALS — BP 140/79 | HR 64 | Temp 98.1°F | Resp 20 | Wt 101.9 lb

## 2017-06-01 DIAGNOSIS — M1990 Unspecified osteoarthritis, unspecified site: Secondary | ICD-10-CM | POA: Diagnosis not present

## 2017-06-01 DIAGNOSIS — J029 Acute pharyngitis, unspecified: Secondary | ICD-10-CM

## 2017-06-01 DIAGNOSIS — Z79899 Other long term (current) drug therapy: Secondary | ICD-10-CM

## 2017-06-01 DIAGNOSIS — D508 Other iron deficiency anemias: Secondary | ICD-10-CM

## 2017-06-01 DIAGNOSIS — Z885 Allergy status to narcotic agent status: Secondary | ICD-10-CM

## 2017-06-01 DIAGNOSIS — E039 Hypothyroidism, unspecified: Secondary | ICD-10-CM | POA: Diagnosis not present

## 2017-06-01 DIAGNOSIS — Z882 Allergy status to sulfonamides status: Secondary | ICD-10-CM

## 2017-06-01 DIAGNOSIS — D509 Iron deficiency anemia, unspecified: Secondary | ICD-10-CM | POA: Diagnosis not present

## 2017-06-01 DIAGNOSIS — I10 Essential (primary) hypertension: Secondary | ICD-10-CM

## 2017-06-01 DIAGNOSIS — Z952 Presence of prosthetic heart valve: Secondary | ICD-10-CM

## 2017-06-01 DIAGNOSIS — I4891 Unspecified atrial fibrillation: Secondary | ICD-10-CM | POA: Diagnosis not present

## 2017-06-01 DIAGNOSIS — Z800 Family history of malignant neoplasm of digestive organs: Secondary | ICD-10-CM | POA: Diagnosis not present

## 2017-06-01 DIAGNOSIS — D500 Iron deficiency anemia secondary to blood loss (chronic): Secondary | ICD-10-CM

## 2017-06-01 NOTE — Progress Notes (Signed)
Diagnosis No diagnosis found.  Staging Cancer Staging No matching staging information was found for the patient.  Assessment and Plan:  1.  IDA.  Patient had labs done 05/25/2017 that shows white count 8.3 hemoglobin 12.8 ferritin 239.  She remains on oral iron and is tolerating treatment without problems.  She denies any blood in her stool or her urine.  She will return to clinic in 6 months for follow-up and repeat labs.  2.  Sore throat.  Patient is afebrile in clinic with a temperature 98.1.  White count is 8.3.  She is advised to do symptomatic therapy with rinses and lozenges.  She should notify the office if her symptoms worsen.  3.  Hypertension.  Blood pressure is 140/79.  Continue to follow with PCP.  Interval History:  84 y.o. female followed by Dr. Talbert Cage for iron deficiency anemia.  She states that she has been tolerating ferrous sulfate 325 mg p.o. daily after supper well.    Current Status:  She reports a sore throat.  She denies any fevers at home.  She is here to go over lab studies.  She denies any GI upset or constipation from her iron tablet.   She denies any bleeding including melena, hematochezia, hematuria.    Problem List Patient Active Problem List   Diagnosis Date Noted  . Iron deficiency anemia due to chronic blood loss [D50.0] 01/12/2017  . Hypothyroidism [E03.9] 01/06/2017  . RBBB [I45.10] 10/12/2013  . Varicose veins [I83.90] 10/12/2013  . Fatigue [R53.83] 11/06/2011  . Orthostatic hypotension [I95.1] 11/06/2011  . Mitral valve failure [I34.0] 10/09/2010  . PULMONARY HYPERTENSION, MILD [I27.89] 09/26/2009  . DIVERTICULOSIS, COLON [K57.30] 09/26/2009  . TRANSIENT ISCHEMIC ATTACKS, HX OF [Z86.79] 09/26/2009  . COLONIC POLYPS, ADENOMATOUS, HX OF [Z86.010] 09/26/2009  . ISCHEMIC COLITIS, HX OF [Z87.19] 09/26/2009  . Essential hypertension [I10] 08/26/2008    Past Medical History Past Medical History:  Diagnosis Date  . Arthritis   . Atrial fibrillation  ([place])    postoperative  . CVA (cerebral vascular accident) ([place])   . HTN (hypertension)   . MR (mitral regurgitation)    severe  . Pericarditis    postoperative    Past Surgical History Past Surgical History:  Procedure Laterality Date  . ANTERIOR AND POSTERIOR VAGINAL REPAIR  10/15/04   Dr. Nori Riis   . anterior repair with perigee graft  04/16/06   posterior repair with apogee graft; lynx mid urethral sling; sacrospinous ligamnet suspension and cystoscopy; Dr. Joya Martyr  . MITRAL VALVE REPLACEMENT    . sacral spinous ligament suspension of vagina    . suprapubic cystectomy    . TOTAL ABDOMINAL HYSTERECTOMY W/ BILATERAL SALPINGOOPHORECTOMY      Family History Family History  Problem Relation Age of Onset  . Heart disease Father   . Lupus Father   . Heart attack Father   . Heart disease Mother   . Colon cancer Brother      Social History  reports that she has never smoked. She has never used smokeless tobacco. She reports that she does not drink alcohol or use drugs.  Medications  Current Outpatient Medications:  .  Cholecalciferol (VITAMIN D3) 10000 UNITS capsule, Take 10,000 Units by mouth daily.  , Disp: , Rfl:  .  escitalopram (LEXAPRO) 10 MG tablet, Take 1 tablet (10 mg total) daily by mouth., Disp: 90 tablet, Rfl: 3 .  fluticasone (FLONASE) 50 MCG/ACT nasal spray, Place 2 sprays into both nostrils daily., Disp: 16 g,  Rfl: 6 .  hydrochlorothiazide (HYDRODIURIL) 25 MG tablet, Take 1 tablet (25 mg total) by mouth daily., Disp: 90 tablet, Rfl: 3 .  hydrOXYzine (ATARAX/VISTARIL) 25 MG tablet, Take 1 tablet (25 mg total) 4 (four) times daily as needed by mouth for anxiety., Disp: 30 tablet, Rfl: 1 .  lisinopril (PRINIVIL,ZESTRIL) 40 MG tablet, Take 1 tablet (40 mg total) by mouth daily., Disp: 30 tablet, Rfl: 11 .  metoprolol tartrate (LOPRESSOR) 50 MG tablet, TAKE ONE TABLET BY MOUTH TWICE DAILY, Disp: 180 tablet, Rfl: 3 .  ferrous sulfate 325 (65 FE) MG EC tablet, Take 1 tablet  (325 mg total) daily after supper by mouth. (Patient not taking: Reported on 02/23/2017), Disp: 100 tablet, Rfl: 3  Current Facility-Administered Medications:  .  levalbuterol (XOPENEX) nebulizer solution 1.25 mg, 1.25 mg, Nebulization, Once, [place], Orson Ape, FNP  Allergies Aspirin-caffeine; Codeine; and Sulfonamide derivatives  Review of Systems Review of Systems - Oncology ROS as per HPI otherwise 12 point ROS is negative.   Physical Exam  Vitals Wt Readings from Last 3 Encounters:  06/01/17 101 lb 14.4 oz (46.2 kg)  02/23/17 100 lb (45.4 kg)  01/12/17 101 lb 6.4 oz (46 kg)   Temp Readings from Last 3 Encounters:  06/01/17 98.1 F (36.7 C) (Oral)  02/23/17 97.7 F (36.5 C) (Oral)  01/12/17 98 F (36.7 C) (Oral)   BP Readings from Last 3 Encounters:  06/01/17 140/79  02/23/17 132/60  01/12/17 (!) 148/51   Pulse Readings from Last 3 Encounters:  06/01/17 64  02/23/17 72  01/12/17 63   Constitutional: Well-developed, well-nourished, and in no distress.   HENT: Head: Normocephalic and atraumatic.  Mouth/Throat: No oropharyngeal exudate. Mucosa moist. Eyes: Pupils are equal, round, and reactive to light. Conjunctivae are normal. No scleral icterus.  Neck: Normal range of motion. Neck supple. No JVD present.  Cardiovascular: Normal rate, regular rhythm and normal heart sounds.  Exam reveals no gallop and no friction rub.   No murmur heard. Pulmonary/Chest: Effort normal and breath sounds normal. No respiratory distress. No wheezes.No rales.  Abdominal: Soft. Bowel sounds are normal. No distension. There is no tenderness. There is no guarding.  Musculoskeletal: No edema or tenderness.  Lymphadenopathy: No cervical, axillary or supraclavicular adenopathy.  Neurological: Alert and oriented to person, place, and time. No cranial nerve deficit.  Skin: Skin is warm and dry. No rash noted. No erythema. No pallor.  Psychiatric: Affect and judgment normal.   Labs No visits  with results within 3 Day(s) from this visit.  Latest known visit with results is:  Appointment on 05/25/2017  Component Date Value Ref Range Status  . WBC 05/25/2017 8.3  4.0 - 10.5 K/uL Final  . RBC 05/25/2017 4.15  3.87 - 5.11 MIL/uL Final  . Hemoglobin 05/25/2017 12.8  12.0 - 15.0 g/dL Final  . HCT 05/25/2017 39.3  36.0 - 46.0 % Final  . MCV 05/25/2017 94.7  78.0 - 100.0 fL Final  . MCH 05/25/2017 30.8  26.0 - 34.0 pg Final  . MCHC 05/25/2017 32.6  30.0 - 36.0 g/dL Final  . RDW 05/25/2017 13.6  11.5 - 15.5 % Final  . Platelets 05/25/2017 225  150 - 400 K/uL Final  . Neutrophils Relative % 05/25/2017 72  % Final  . Neutro Abs 05/25/2017 6.0  1.7 - 7.7 K/uL Final  . Lymphocytes Relative 05/25/2017 19  % Final  . Lymphs Abs 05/25/2017 1.6  0.7 - 4.0 K/uL Final  . Monocytes Relative 05/25/2017  7  % Final  . Monocytes Absolute 05/25/2017 0.5  0.1 - 1.0 K/uL Final  . Eosinophils Relative 05/25/2017 2  % Final  . Eosinophils Absolute 05/25/2017 0.2  0.0 - 0.7 K/uL Final  . Basophils Relative 05/25/2017 0  % Final  . Basophils Absolute 05/25/2017 0.0  0.0 - 0.1 K/uL Final   Performed at [facility], [address] [place], [place] [address]  . Sodium 05/25/2017 140  135 - 145 mmol/L Final  . Potassium 05/25/2017 4.2  3.5 - 5.1 mmol/L Final  . Chloride 05/25/2017 102  101 - 111 mmol/L Final  . CO2 05/25/2017 27  22 - 32 mmol/L Final  . Glucose, Bld 05/25/2017 92  65 - 99 mg/dL Final  . BUN 05/25/2017 26* 6 - 20 mg/dL Final  . Creatinine, Ser 05/25/2017 1.15* 0.44 - 1.00 mg/dL Final  . Calcium 05/25/2017 10.2  8.9 - 10.3 mg/dL Final  . Total Protein 05/25/2017 7.4  6.5 - 8.1 g/dL Final  . Albumin 05/25/2017 4.2  3.5 - 5.0 g/dL Final  . AST 05/25/2017 22  15 - 41 U/L Final  . ALT 05/25/2017 14  14 - 54 U/L Final  . Alkaline Phosphatase 05/25/2017 59  38 - 126 U/L Final  . Total Bilirubin 05/25/2017 1.1  0.3 - 1.2 mg/dL Final  . GFR calc non Af Amer 05/25/2017 42* >60 mL/min Final  .  GFR calc Af Amer 05/25/2017 48* >60 mL/min Final   Comment: (NOTE) The eGFR has been calculated using the CKD EPI equation. This calculation has not been validated in all clinical situations. eGFR's persistently <60 mL/min signify possible Chronic Kidney Disease.   Georgiann Hahn gap 05/25/2017 11  5 - 15 Final   Performed at [facility], [address] [place], [place] [address]  . Ferritin 05/25/2017 239  11 - 307 ng/mL Final   Performed at [facility] [address] [place], [place] [address]  . Iron 05/25/2017 91  28 - 170 ug/dL Final  . TIBC 05/25/2017 343  250 - 450 ug/dL Final  . Saturation Ratios 05/25/2017 27  10.4 - 31.8 % Final  . UIBC 05/25/2017 252  ug/dL Final   Performed at [facility] [address] [place], [place] [address]     Pathology No orders of the defined types were placed in this encounter.      Zoila Shutter MD

## 2017-06-01 NOTE — Patient Instructions (Addendum)
[place] at [facility] Discharge Instructions  You were seen today by Dr. Walden Field. She went over your recent lab work and everything looked good. Continue taking your iron tablets. We will see you back in 6 months for labs and follow up.    Thank you for choosing [place] at [facility] to provide your oncology and hematology care.  To afford each patient quality time with our provider, please arrive at least 15 minutes before your scheduled appointment time.   If you have a lab appointment with the [facility] please come in thru the  Main Entrance and check in at the main information desk  You need to re-schedule your appointment should you arrive 10 or more minutes late.  We strive to give you quality time with our providers, and arriving late affects you and other patients whose appointments are after yours.  Also, if you no show three or more times for appointments you may be dismissed from the clinic at the providers discretion.     Again, thank you for choosing [facility].  Our hope is that these requests will decrease the amount of time that you wait before being seen by our physicians.       _____________________________________________________________  Should you have questions after your visit to [facility], please contact our office at [phone] between the hours of 8:30 a.m. and 4:30 p.m.  Voicemails left after 4:30 p.m. will not be returned until the following business day.  For prescription refill requests, have your pharmacy contact our office.       Resources For Cancer Patients and their Caregivers ? American Cancer Society: Can assist with transportation, wigs, general needs, runs Look Good Feel Better.        930-099-8247 ? Cancer Care: Provides financial assistance, online support groups, medication/co-pay assistance.  1-800-813-HOPE 340-117-6287) ? [facility] Assists [place] cancer patients and their families through emotional , educational and financial support.  (806) 345-1694 ? [place] DSS Where to apply for food stamps, Medicaid and utility assistance. 915-020-8991 ? [facility]: Transportation to medical appointments. 802-654-9881 ? Social Security Administration: May apply for disability if have a Stage IV cancer. 949-529-3701 9095233826 ? [employer], Disability and Transit Services: Assists with nutrition, care and transit needs. [place] Support Programs:   > Cancer Support Group  2nd Tuesday of the month 1pm-2pm, Journey Room   > Creative Journey  3rd Tuesday of the month 1130am-1pm, Journey Room

## 2017-06-03 ENCOUNTER — Encounter: Payer: Self-pay | Admitting: Family Medicine

## 2017-06-03 ENCOUNTER — Ambulatory Visit (INDEPENDENT_AMBULATORY_CARE_PROVIDER_SITE_OTHER): Payer: Medicare Other | Admitting: Family Medicine

## 2017-06-03 VITALS — BP 119/75 | HR 86 | Temp 97.7°F | Ht 63.5 in | Wt 102.5 lb

## 2017-06-03 DIAGNOSIS — J40 Bronchitis, not specified as acute or chronic: Secondary | ICD-10-CM

## 2017-06-03 DIAGNOSIS — J329 Chronic sinusitis, unspecified: Secondary | ICD-10-CM | POA: Diagnosis not present

## 2017-06-03 MED ORDER — HYDROCODONE-HOMATROPINE 5-1.5 MG/5ML PO SYRP: 5.0000 mL | ORAL_SOLUTION | Freq: Four times a day (QID) | ORAL | 0 refills | Status: DC | PRN

## 2017-06-03 MED ORDER — AMOXICILLIN-POT CLAVULANATE 875-125 MG PO TABS: 1.0000 | ORAL_TABLET | Freq: Two times a day (BID) | ORAL | 0 refills | Status: DC

## 2017-06-03 NOTE — Progress Notes (Signed)
Chief Complaint  Patient presents with  . Sinus Problem    pt here today c/o left ear pressure, sinus pressure, headache, cough, sore throat and drainage    HPI  Patient presents today for Patient presents with upper respiratory congestion. Rhinorrhea that is scant. Intermittent left ear pain when she coughs. There is moderate sore throat with hoarseness. Patient reports coughing profusely as well. Yellow sputum noted. There is no fever, chills or sweats. The patient denies being short of breath. Onset was 3 days ago. Gradually worsening. Tried OTCs without improvement.  PMH: Smoking status noted ROS: Per HPI  Objective: BP 119/75   Pulse 86   Temp 97.7 F (36.5 C) (Oral)   Ht 5' 3.5" (1.613 m)   Wt 102 lb 8 oz (46.5 kg)   BMI 17.87 kg/m  Gen: NAD, alert, cooperative with exam HEENT: NCAT, Nasal passages swollen, red TMS clear CV: RRR, good S1/S2, no murmur Resp: Bronchitis changes with scattered wheezes, non-labored Ext: No edema, warm Neuro: Alert and oriented, No gross deficits  Assessment and plan:  1. Sinobronchitis     Meds ordered this encounter  Medications  . amoxicillin-clavulanate (AUGMENTIN) 875-125 MG tablet    Sig: Take 1 tablet by mouth 2 (two) times daily.    Dispense:  20 tablet    Refill:  0  . HYDROcodone-homatropine (HYCODAN) 5-1.5 MG/5ML syrup    Sig: Take 5 mLs by mouth every 6 (six) hours as needed for cough.    Dispense:  120 mL    Refill:  0    No orders of the defined types were placed in this encounter.   Follow up as needed.  [name], MD

## 2017-06-15 ENCOUNTER — Ambulatory Visit (INDEPENDENT_AMBULATORY_CARE_PROVIDER_SITE_OTHER): Payer: Medicare Other | Admitting: Nurse Practitioner

## 2017-06-15 ENCOUNTER — Encounter: Payer: Self-pay | Admitting: Nurse Practitioner

## 2017-06-15 VITALS — BP 139/65 | HR 67 | Temp 97.4°F

## 2017-06-15 DIAGNOSIS — Z23 Encounter for immunization: Secondary | ICD-10-CM | POA: Diagnosis not present

## 2017-06-15 DIAGNOSIS — S51811A Laceration without foreign body of right forearm, initial encounter: Secondary | ICD-10-CM | POA: Diagnosis not present

## 2017-06-15 NOTE — Patient Instructions (Signed)
Sterile Tape Wound Care Some cuts and wounds can be closed using sterile tape, also called skin adhesive strips. Skin adhesive strips can be used for shallow (superficial) and simple cuts, wounds, lacerations, and some surgical incisions. These strips act in place of stitches, or in addition to stitches, to hold the edges of the wound together to allow for better healing. Unlike stitches, the adhesive strips do not require needles or anesthetic medicine for placement. The strips usually fall off on their own as the wound is healing. It is important to take proper care of your wound at home while it heals. How to care for a sterile tape wound  Try to keep the area around your wound clean and dry. Do not allow the adhesive strips to get wet for the first 12 hours.  Do not use any soaps or ointments on the wound for the first 12 hours.  If a bandage (dressing) has been applied, keep it dry.  Follow instructions from your health care provider about how often to change the dressing. ? Wash your hands with soap and water before you change your dressing. If soap and water are not available, use hand sanitizer. ? Change your dressing as told by your health care provider. ? Leave adhesive strips in place. These skin closures may need to stay in place for 2 weeks or longer. If adhesive strip edges start to loosen and curl up, you may trim the loose edges. Do not remove adhesive strips completely unless your health care provider tells you to do that.  Do not scratch, rub, or pick at the wound area.  Protect the wound from further injury until it is healed.  Protect the wound from sun and tanning bed exposure while it is healing, and for several weeks after healing.  Check the wound every day for signs of infection. Check for: ? More redness, swelling, or pain. ? More fluid or blood. ? Warmth. ? Pus or a bad smell. Follow these instructions at home:  Take over-the-counter and prescription medicines  only as told by your health care provider.  Keep all follow-up visits as told by your health care provider. This is important. Contact a health care provider if:  Your adhesive strips become soaked with blood or fall off before the wound has healed. The tape will need to be replaced.  You have a fever. Get help right away if:  You have chills.  You develop a rash after the strips are applied.  You have a red streak that goes away from the wound.  You have more redness, swelling, or pain around your wound.  You have more fluid or blood coming from your wound.  Your wound feels warm to the touch.  You have pus or a bad smell coming from your wound.  Your wound breaks open. This information is not intended to replace advice given to you by your health care provider. Make sure you discuss any questions you have with your health care provider. Document Released: 04/03/2004 Document Revised: 01/18/2016 Document Reviewed: 01/18/2016 Elsevier Interactive Patient Education  2017 [employer].

## 2017-06-15 NOTE — Progress Notes (Signed)
   Subjective:    Patient ID: [name], female    DOB: [birth year], 84 y.o.   MRN: [id number]  HPI  Patient come sin today having fallen and has a superificial laceration to right forearm. She also hit her nose on trash can but it is not sre at all, just bruised.   Review of Systems  Constitutional: Negative for activity change and appetite change.  HENT: Negative.   Eyes: Negative for pain.  Respiratory: Negative for shortness of breath.   Cardiovascular: Negative for chest pain, palpitations and leg swelling.  Gastrointestinal: Negative for abdominal pain.  Endocrine: Negative for polydipsia.  Genitourinary: Negative.   Skin: Negative for rash.  Neurological: Negative for dizziness, weakness and headaches.  Hematological: Does not bruise/bleed easily.  Psychiatric/Behavioral: Negative.   All other systems reviewed and are negative.      Objective:   Physical Exam  Constitutional: She is oriented to person, place, and time. She appears well-developed and well-nourished. No distress.  Cardiovascular: Normal rate.  Pulmonary/Chest: Effort normal.  Neurological: She is alert and oriented to person, place, and time.  Skin: Skin is warm.  Skin tear to right forearm    BP 139/65 (BP Location: Left Arm, Patient Position: Sitting, Cuff Size: Normal)   Pulse 67   Temp (!) 97.4 F (36.3 C) (Oral)   Procedure:  Betadine prep  Skin tear reapproximated with pickups and steristrips  Pressure dressing applied      Assessment & Plan:   1. Skin tear of forearm without complication, right, initial encounter    Keep clean and dry Do not pick at steri strips Follow up on Thursday for wound check  Mary-Margaret Hassell Done, FNP

## 2017-06-18 ENCOUNTER — Encounter: Payer: Self-pay | Admitting: Nurse Practitioner

## 2017-06-18 ENCOUNTER — Ambulatory Visit (INDEPENDENT_AMBULATORY_CARE_PROVIDER_SITE_OTHER): Payer: Medicare Other | Admitting: Nurse Practitioner

## 2017-06-18 VITALS — BP 130/67 | HR 60 | Temp 96.7°F | Ht 63.0 in | Wt 100.0 lb

## 2017-06-18 DIAGNOSIS — Z4800 Encounter for change or removal of nonsurgical wound dressing: Secondary | ICD-10-CM

## 2017-06-18 NOTE — Progress Notes (Signed)
   Subjective:    Patient ID: [name], female    DOB: [birth year], 84 y.o.   MRN: [id number]  HPI patient was seen 06/15/17 with skin tear to right forearm. We reapproximated the skin and put steristrips on it. She is here today to let us see if it is healing correctly.    Review of Systems  Constitutional: Negative for activity change and appetite change.  HENT: Negative.   Eyes: Negative for pain.  Respiratory: Negative for shortness of breath.   Cardiovascular: Negative for chest pain, palpitations and leg swelling.  Gastrointestinal: Negative for abdominal pain.  Endocrine: Negative for polydipsia.  Genitourinary: Negative.   Skin: Negative for rash.  Neurological: Negative for dizziness, weakness and headaches.  Hematological: Does not bruise/bleed easily.  Psychiatric/Behavioral: Negative.   All other systems reviewed and are negative.      Objective:   Physical Exam  Constitutional: She appears well-developed and well-nourished.  Cardiovascular: Normal rate.  Pulmonary/Chest: Effort normal.  Skin:  Wound edges well approximate steristrips in place No sign of infection   BP 130/67   Pulse 60   Temp (!) 96.7 F (35.9 C) (Oral)   Ht 5\' 3"  (1.6 m)   Wt 100 lb (45.4 kg)   BMI 17.71 kg/m       Assessment & Plan:  Dressing change  Keep clean and dry Continue to watch for signs of infection Do  Not pick at steri strips RTO prn  Mary-Margaret Hassell Done, FNP

## 2017-06-18 NOTE — Patient Instructions (Signed)
Sterile Tape Wound Care Some cuts and wounds can be closed using sterile tape, also called skin adhesive strips. Skin adhesive strips can be used for shallow (superficial) and simple cuts, wounds, lacerations, and some surgical incisions. These strips act in place of stitches, or in addition to stitches, to hold the edges of the wound together to allow for better healing. Unlike stitches, the adhesive strips do not require needles or anesthetic medicine for placement. The strips usually fall off on their own as the wound is healing. It is important to take proper care of your wound at home while it heals. How to care for a sterile tape wound  Try to keep the area around your wound clean and dry. Do not allow the adhesive strips to get wet for the first 12 hours.  Do not use any soaps or ointments on the wound for the first 12 hours.  If a bandage (dressing) has been applied, keep it dry.  Follow instructions from your health care provider about how often to change the dressing. ? Wash your hands with soap and water before you change your dressing. If soap and water are not available, use hand sanitizer. ? Change your dressing as told by your health care provider. ? Leave adhesive strips in place. These skin closures may need to stay in place for 2 weeks or longer. If adhesive strip edges start to loosen and curl up, you may trim the loose edges. Do not remove adhesive strips completely unless your health care provider tells you to do that.  Do not scratch, rub, or pick at the wound area.  Protect the wound from further injury until it is healed.  Protect the wound from sun and tanning bed exposure while it is healing, and for several weeks after healing.  Check the wound every day for signs of infection. Check for: ? More redness, swelling, or pain. ? More fluid or blood. ? Warmth. ? Pus or a bad smell. Follow these instructions at home:  Take over-the-counter and prescription medicines  only as told by your health care provider.  Keep all follow-up visits as told by your health care provider. This is important. Contact a health care provider if:  Your adhesive strips become soaked with blood or fall off before the wound has healed. The tape will need to be replaced.  You have a fever. Get help right away if:  You have chills.  You develop a rash after the strips are applied.  You have a red streak that goes away from the wound.  You have more redness, swelling, or pain around your wound.  You have more fluid or blood coming from your wound.  Your wound feels warm to the touch.  You have pus or a bad smell coming from your wound.  Your wound breaks open. This information is not intended to replace advice given to you by your health care provider. Make sure you discuss any questions you have with your health care provider. Document Released: 04/03/2004 Document Revised: 01/18/2016 Document Reviewed: 01/18/2016 Elsevier Interactive Patient Education  2017 [employer].

## 2017-07-06 ENCOUNTER — Ambulatory Visit: Payer: Medicare Other | Admitting: Physician Assistant

## 2017-07-07 ENCOUNTER — Encounter: Payer: Self-pay | Admitting: Nurse Practitioner

## 2017-07-07 ENCOUNTER — Ambulatory Visit: Payer: Medicare Other | Admitting: Family Medicine

## 2017-07-07 ENCOUNTER — Ambulatory Visit (INDEPENDENT_AMBULATORY_CARE_PROVIDER_SITE_OTHER): Payer: Medicare Other | Admitting: Nurse Practitioner

## 2017-07-07 VITALS — BP 106/67 | HR 68 | Temp 96.8°F | Ht 63.0 in | Wt 100.0 lb

## 2017-07-07 DIAGNOSIS — Z5189 Encounter for other specified aftercare: Secondary | ICD-10-CM

## 2017-07-07 NOTE — Progress Notes (Signed)
   Subjective:    Patient ID: [name], female    DOB: 09/09/1928, 84 y.o.   MRN: [id number]  HPI patient was seen on 06/15/17 with skin tear to right forearm. We put steristrips on it and she comes in today stating that steristrips are in bedded in her skin.    Review of Systems  Skin: Positive for wound (right forearm).  All other systems reviewed and are negative.      Objective:   Physical Exam  Constitutional: She is oriented to person, place, and time.  Neurological: She is alert and oriented to person, place, and time.  Skin: Skin is warm.  Wound edges well approximated- soak in nacl and steri strips removed   BP 106/67   Pulse 68   Temp (!) 96.8 F (36 C) (Oral)   Ht 5\' 3"  (1.6 m)   Wt 100 lb (45.4 kg)   BMI 17.71 kg/m         Assessment & Plan:   1. Visit for wound care    Steri strips removed No need to cover anymore RTO prn  Mary-Margaret Hassell Done, FNP

## 2017-07-30 DIAGNOSIS — Z1231 Encounter for screening mammogram for malignant neoplasm of breast: Secondary | ICD-10-CM | POA: Diagnosis not present

## 2017-10-07 DIAGNOSIS — Z01419 Encounter for gynecological examination (general) (routine) without abnormal findings: Secondary | ICD-10-CM | POA: Diagnosis not present

## 2017-10-07 DIAGNOSIS — Z124 Encounter for screening for malignant neoplasm of cervix: Secondary | ICD-10-CM | POA: Diagnosis not present

## 2017-10-07 DIAGNOSIS — Z681 Body mass index (BMI) 19 or less, adult: Secondary | ICD-10-CM | POA: Diagnosis not present

## 2017-11-19 ENCOUNTER — Encounter: Payer: Self-pay | Admitting: Cardiovascular Disease

## 2017-11-24 NOTE — Progress Notes (Signed)
Patient ID: [name], female   DOB: 1929-01-19, 84 y.o.   MRN: [id number]   84 y.o.  F/u MV disease. I took care of her husband [name] who passed from lung cancer 2 years ago after 64 years of marriage Last echo done 12/03/11 with EF 60% mild AR MV repair normal with no MR and mild LAE  Still driving Needs refill on BP meds  Has some varicose veins that look bad but don't hurt and no edema   Son [name] tragically died in a chain saw accident last January 07, 2017. Two daughters one near [place] and one close by With another son living with her but [name] was the glue for the family  Has lost some weight due to depression   ROS: Denies fever, malais, weight loss, blurry vision, decreased visual acuity, cough, sputum, SOB, hemoptysis, pleuritic pain, palpitaitons, heartburn, abdominal pain, melena, lower extremity edema, claudication, or rash.  All other systems reviewed and negative  General: BP 134/80   Pulse 62   Ht 5\' 3"  (1.6 m)   Wt 99 lb 3.2 oz (45 kg)   BMI 17.57 kg/m  Affect appropriate Healthy:  Elderly white female  HEENT: normal Neck supple with no adenopathy JVP normal no bruits no thyromegaly Lungs clear with no wheezing and good diaphragmatic motion Heart:  S1/S2 no murmur, no rub, gallop or click PMI normal post sternotomy  Abdomen: benighn, BS positve, no tenderness, no AAA no bruit.  No HSM or HJR Distal pulses intact with no bruits No edema Neuro non-focal Skin warm and dry No muscular weakness      Current Outpatient Medications  Medication Sig Dispense Refill  . amoxicillin (AMOXIL) 500 MG tablet Take 500 mg by mouth as directed. Prior to dental procedures    . Cholecalciferol (VITAMIN D3) 10000 UNITS capsule Take 10,000 Units by mouth daily.      . hydrochlorothiazide (HYDRODIURIL) 25 MG tablet Take 1 tablet (25 mg total) by mouth daily. 90 tablet 3  . IRON PO Take 65 mg by mouth daily.    Marland Kitchen lisinopril (PRINIVIL,ZESTRIL) 40 MG tablet Take 1 tablet  (40 mg total) by mouth daily. 30 tablet 11  . metoprolol tartrate (LOPRESSOR) 50 MG tablet TAKE ONE TABLET BY MOUTH TWICE DAILY 180 tablet 3   Current Facility-Administered Medications  Medication Dose Route Frequency Provider Last Rate Last Dose  . levalbuterol (XOPENEX) nebulizer solution 1.25 mg  1.25 mg Nebulization Once Lysbeth Penner, FNP        Allergies  Aspirin-caffeine; Codeine; and Sulfonamide derivatives  Electrocardiogram:   12/02/17  SR rate 62 LAD RBBB  Assessment and Plan MV Repair:  SBE  No murmur last echo 06/08/2011 but since asymptomatic and given age will observe  HTN: Well controlled.  Continue current medications and low sodium Dash type diet.   Thyroid:  Continue current replacement dose consider increasing by 25ug Lab Results  Component Value Date   TSH 1.560 01/06/2017   PAF:  Maint NSR no palpitations ECG looks fine no change from 06-07-2013   Varicose Veins:  Only painful on long trips encouraged her to wear support hose  Depression:  Husband [name] died of lung cancer Jun 08, 2015 Son [name] died 07-Jan-2017   [name]

## 2017-11-25 ENCOUNTER — Other Ambulatory Visit (HOSPITAL_COMMUNITY): Payer: Self-pay | Admitting: *Deleted

## 2017-11-25 DIAGNOSIS — D500 Iron deficiency anemia secondary to blood loss (chronic): Secondary | ICD-10-CM

## 2017-11-26 ENCOUNTER — Other Ambulatory Visit (HOSPITAL_COMMUNITY): Payer: Medicare Other

## 2017-11-30 ENCOUNTER — Ambulatory Visit: Payer: Medicare Other | Admitting: Cardiovascular Disease

## 2017-12-02 ENCOUNTER — Encounter: Payer: Self-pay | Admitting: Cardiovascular Disease

## 2017-12-02 ENCOUNTER — Ambulatory Visit (INDEPENDENT_AMBULATORY_CARE_PROVIDER_SITE_OTHER): Payer: Medicare Other | Admitting: Cardiovascular Disease

## 2017-12-02 ENCOUNTER — Encounter (INDEPENDENT_AMBULATORY_CARE_PROVIDER_SITE_OTHER): Payer: Self-pay

## 2017-12-02 VITALS — BP 134/80 | HR 62 | Ht 63.0 in | Wt 99.2 lb

## 2017-12-02 DIAGNOSIS — I340 Nonrheumatic mitral (valve) insufficiency: Secondary | ICD-10-CM | POA: Diagnosis not present

## 2017-12-02 DIAGNOSIS — Z9889 Other specified postprocedural states: Secondary | ICD-10-CM

## 2017-12-02 DIAGNOSIS — I10 Essential (primary) hypertension: Secondary | ICD-10-CM | POA: Diagnosis not present

## 2017-12-02 MED ORDER — HYDROCHLOROTHIAZIDE 25 MG PO TABS: 25.0000 mg | ORAL_TABLET | Freq: Every day | ORAL | 3 refills | Status: DC

## 2017-12-02 MED ORDER — LISINOPRIL 40 MG PO TABS: 40.0000 mg | ORAL_TABLET | Freq: Every day | ORAL | 3 refills | Status: DC

## 2017-12-02 MED ORDER — METOPROLOL TARTRATE 50 MG PO TABS: 50.0000 mg | ORAL_TABLET | Freq: Two times a day (BID) | ORAL | 3 refills | Status: DC

## 2017-12-02 NOTE — Addendum Note (Signed)
Addended by: Aris Georgia, Pearla Mckinny L on: 12/02/2017 03:08 PM   Modules accepted: Orders

## 2017-12-02 NOTE — Patient Instructions (Signed)

## 2017-12-03 ENCOUNTER — Ambulatory Visit (HOSPITAL_COMMUNITY): Payer: Medicare Other | Admitting: Internal Medicine

## 2017-12-03 ENCOUNTER — Other Ambulatory Visit (HOSPITAL_COMMUNITY): Payer: Medicare Other

## 2017-12-07 NOTE — Addendum Note (Signed)
Addended by: Joaquim Lai on: 12/07/2017 07:54 AM   Modules accepted: Orders

## 2018-02-09 ENCOUNTER — Ambulatory Visit (INDEPENDENT_AMBULATORY_CARE_PROVIDER_SITE_OTHER): Payer: Medicare Other | Admitting: *Deleted

## 2018-02-09 DIAGNOSIS — Z23 Encounter for immunization: Secondary | ICD-10-CM

## 2018-05-26 ENCOUNTER — Ambulatory Visit: Payer: Medicare Other | Admitting: *Deleted

## 2018-08-16 ENCOUNTER — Encounter: Payer: Self-pay | Admitting: Family Medicine

## 2018-08-16 ENCOUNTER — Ambulatory Visit (INDEPENDENT_AMBULATORY_CARE_PROVIDER_SITE_OTHER): Payer: Medicare Other | Admitting: Family Medicine

## 2018-08-16 ENCOUNTER — Other Ambulatory Visit: Payer: Self-pay

## 2018-08-16 DIAGNOSIS — D509 Iron deficiency anemia, unspecified: Secondary | ICD-10-CM

## 2018-08-16 DIAGNOSIS — R5383 Other fatigue: Secondary | ICD-10-CM

## 2018-08-16 MED ORDER — FERROUS GLUCONATE 324 (38 FE) MG PO TABS: 324.0000 mg | ORAL_TABLET | Freq: Every day | ORAL | 3 refills | Status: DC

## 2018-08-16 NOTE — Progress Notes (Signed)
Virtual Visit via telephone Note  I connected with [name] on 08/16/18 at 1433 by telephone and verified that I am speaking with the correct person using two identifiers. [name] is currently located at home and no other people are currently with her during visit. The provider, Fransisca Kaufmann , MD is located in their office at time of visit.  Call ended at 1443  I discussed the limitations, risks, security and privacy concerns of performing an evaluation and management service by telephone and the availability of in person appointments. I also discussed with the patient that there may be a patient responsible charge related to this service. The patient expressed understanding and agreed to proceed.   History and Present Illness: Patient is not feeling right and she feels weak and no energy. She had iron before and now is not on it because of stomach issues.  She feels the same as she had when she was anemic before  And was on iron and she feels like she needs it.  She just feels generally weak and fatigued and just with no energy and it started over the past couple months.  It is gotten worse over the past week.  She would like to come in and get blood work for this but because of the coronavirus she is trying to avoid that for now.  No diagnosis found.  Outpatient Encounter Medications as of 08/16/2018  Medication Sig  . amoxicillin (AMOXIL) 500 MG tablet Take 500 mg by mouth as directed. Prior to dental procedures  . Cholecalciferol (VITAMIN D3) 10000 UNITS capsule Take 10,000 Units by mouth daily.    . hydrochlorothiazide (HYDRODIURIL) 25 MG tablet Take 1 tablet (25 mg total) by mouth daily.  . IRON PO Take 65 mg by mouth daily.  Marland Kitchen lisinopril (PRINIVIL,ZESTRIL) 40 MG tablet Take 1 tablet (40 mg total) by mouth daily.  . metoprolol tartrate (LOPRESSOR) 50 MG tablet Take 1 tablet (50 mg total) by mouth 2 (two) times daily.   Facility-Administered Encounter Medications as of  08/16/2018  Medication  . levalbuterol (XOPENEX) nebulizer solution 1.25 mg    Review of Systems  Constitutional: Positive for fatigue. Negative for chills and fever.  Eyes: Negative for visual disturbance.  Respiratory: Negative for chest tightness and shortness of breath.   Cardiovascular: Negative for chest pain and leg swelling.  Musculoskeletal: Negative for back pain and gait problem.  Skin: Negative for rash.  Neurological: Positive for weakness. Negative for light-headedness and headaches.  Psychiatric/Behavioral: Negative for agitation and behavioral problems.  All other systems reviewed and are negative.   Observations/Objective: Patient sounds comfortable and in no acute distress  Assessment and Plan: Problem List Items Addressed This Visit      Other   Fatigue - Primary   Relevant Medications   ferrous gluconate (FERGON) 324 MG tablet    Other Visit Diagnoses    Iron deficiency anemia, unspecified iron deficiency anemia type       Relevant Medications   ferrous gluconate (FERGON) 324 MG tablet       Follow Up Instructions:  Follow-up as soon as she feels comfortable coming out with the coronavirus for blood work and testing her iron levels   I discussed the assessment and treatment plan with the patient. The patient was provided an opportunity to ask questions and all were answered. The patient agreed with the plan and demonstrated an understanding of the instructions.   The patient was advised to call back  or seek an in-person evaluation if the symptoms worsen or if the condition fails to improve as anticipated.  The above assessment and management plan was discussed with the patient. The patient verbalized understanding of and has agreed to the management plan. Patient is aware to call the clinic if symptoms persist or worsen. Patient is aware when to return to the clinic for a follow-up visit. Patient educated on when it is appropriate to go to the emergency  department.    I provided 10 minutes of non-face-to-face time during this encounter.    Worthy Rancher, MD

## 2018-08-24 ENCOUNTER — Ambulatory Visit (INDEPENDENT_AMBULATORY_CARE_PROVIDER_SITE_OTHER): Payer: Medicare Other | Admitting: *Deleted

## 2018-08-24 VITALS — Wt 99.2 lb

## 2018-08-24 DIAGNOSIS — Z0000 Encounter for general adult medical examination without abnormal findings: Secondary | ICD-10-CM | POA: Diagnosis not present

## 2018-08-24 NOTE — Patient Instructions (Signed)
[name] , Thank you for taking time to come for your Medicare Wellness Visit. I appreciate your ongoing commitment to your health goals. Please review the following plan we discussed and let me know if I can assist you in the future.   These are the goals we discussed: Goals     Exercise 3x per week (30 min per time)     Try to exercise for at least 66mins, 3 times weekly       This is a list of the screening recommended for you and due dates:  Health Maintenance  Topic Date Due   DEXA scan (bone density measurement)  05/22/1995   Pneumonia vaccines (2 of 2 - PPSV23) 12/07/2014   Flu Shot  10/09/2018   Tetanus Vaccine  06/16/2027     Advance Directive  Advance directives are legal documents that let you make choices ahead of time about your health care and medical treatment in case you become unable to communicate for yourself. Advance directives are a way for you to communicate your wishes to family, friends, and health care providers. This can help convey your decisions about end-of-life care if you become unable to communicate. Discussing and writing advance directives should happen over time rather than all at once. Advance directives can be changed depending on your situation and what you want, even after you have signed the advance directives. If you do not have an advance directive, some states assign family decision makers to act on your behalf based on how closely you are related to them. Each state has its own laws regarding advance directives. You may want to check with your health care provider, attorney, or state representative about the laws in your state. There are different types of advance directives, such as:  Medical power of attorney.  Living will.  Do not resuscitate (DNR) or do not attempt resuscitation (DNAR) order. Health care proxy and medical power of attorney A health care proxy, also called a health care agent, is a person who is appointed to make  medical decisions for you in cases in which you are unable to make the decisions yourself. Generally, people choose someone they know well and trust to represent their preferences. Make sure to ask this person for an agreement to act as your proxy. A proxy may have to exercise judgment in the event of a medical decision for which your wishes are not known. A medical power of attorney is a legal document that names your health care proxy. Depending on the laws in your state, after the document is written, it may also need to be:  Signed.  Notarized.  Dated.  Copied.  Witnessed.  Incorporated into your medical record. You may also want to appoint someone to manage your financial affairs in a situation in which you are unable to do so. This is called a durable power of attorney for finances. It is a separate legal document from the durable power of attorney for health care. You may choose the same person or someone different from your health care proxy to act as your agent in financial matters. If you do not appoint a proxy, or if there is a concern that the proxy is not acting in your best interests, a court-appointed guardian may be designated to act on your behalf. Living will A living will is a set of instructions documenting your wishes about medical care when you cannot express them yourself. Health care providers should keep a copy of your  living will in your medical record. You may want to give a copy to family members or friends. To alert caregivers in case of an emergency, you can place a card in your wallet to let them know that you have a living will and where they can find it. A living will is used if you become:  Terminally ill.  Incapacitated.  Unable to communicate or make decisions. Items to consider in your living will include:  The use or non-use of life-sustaining equipment, such as dialysis machines and breathing machines (ventilators).  A DNR or DNAR order, which is the  instruction not to use cardiopulmonary resuscitation (CPR) if breathing or heartbeat stops.  The use or non-use of tube feeding.  Withholding of food and fluids.  Comfort (palliative) care when the goal becomes comfort rather than a cure.  Organ and tissue donation. A living will does not give instructions for distributing your money and property if you should pass away. It is recommended that you seek the advice of a lawyer when writing a will. Decisions about taxes, beneficiaries, and asset distribution will be legally binding. This process can relieve your family and friends of any concerns surrounding disputes or questions that may come up about the distribution of your assets. DNR or DNAR A DNR or DNAR order is a request not to have CPR in the event that your heart stops beating or you stop breathing. If a DNR or DNAR order has not been made and shared, a health care provider will try to help any patient whose heart has stopped or who has stopped breathing. If you plan to have surgery, talk with your health care provider about how your DNR or DNAR order will be followed if problems occur. Summary  Advance directives are the legal documents that allow you to make choices ahead of time about your health care and medical treatment in case you become unable to communicate for yourself.  The process of discussing and writing advance directives should happen over time. You can change the advance directives, even after you have signed them.  Advance directives include DNR or DNAR orders, living wills, and designating an agent as your medical power of attorney. This information is not intended to replace advice given to you by your health care provider. Make sure you discuss any questions you have with your health care provider. Document Released: 06/03/2007 Document Revised: 01/14/2016 Document Reviewed: 01/14/2016 Elsevier Interactive Patient Education  2019 [employer].

## 2018-08-24 NOTE — Progress Notes (Signed)
MEDICARE ANNUAL WELLNESS VISIT  08/24/2018  Telephone Visit Disclaimer This Medicare AWV was conducted by telephone due to national recommendations for restrictions regarding the COVID-19 Pandemic (e.g. social distancing).  I verified, using two identifiers, that I am speaking with [name] or their authorized healthcare agent. I discussed the limitations, risks, security, and privacy concerns of performing an evaluation and management service by telephone and the potential availability of an in-person appointment in the future. The patient expressed understanding and agreed to proceed.   Subjective:  [name] is a 85 y.o. female patient of Stacks, Cletus Gash, MD who had a Medicare Annual Wellness Visit today via telephone. [name] is Retired and lives with their son. she has 3 living children. she reports that she is socially active and does interact with friends/family regularly. she is minimally physically active and enjoys gardening.  Patient Care Team: [name], MD as PCP - General (Family Medicine) Chipper Herb, MD (Family Medicine)  Advanced Directives 08/24/2018 06/01/2017 02/23/2017 01/12/2017  Does Patient Have a Medical Advance Directive? No No No Yes  Type of Advance Directive - - - Living will  Does patient want to make changes to medical advance directive? - - No - Patient declined No - Patient declined  Would patient like information on creating a medical advance directive? Yes (MAU/Ambulatory/Procedural Areas - Information given) No - Patient declined - [facility] Utilization Over the Past 12 Months: # of hospitalizations or ER visits: 0 # of surgeries: 0  Review of Systems    Patient reports that her overall health is unchanged compared to last year.  Patient Reported Readings (BP, Pulse, CBG, Weight, etc) none  Review of Systems: History obtained from chart review and the patient General ROS: negative  All other systems negative.  Pain  Assessment Pain : No/denies pain     Current Medications & Allergies (verified) Allergies as of 08/24/2018      Reactions   Aspirin-caffeine    Upset stomach   Codeine    REACTION: upset stomach   Sulfonamide Derivatives    REACTION: swollen tongue      Medication List       Accurate as of August 24, 2018  3:55 PM. If you have any questions, ask your nurse or doctor.        ferrous gluconate 324 MG tablet Commonly known as: FERGON Take 1 tablet (324 mg total) by mouth daily with breakfast.   hydrochlorothiazide 25 MG tablet Commonly known as: HYDRODIURIL Take 1 tablet (25 mg total) by mouth daily.   IRON PO Take 65 mg by mouth daily.   lisinopril 40 MG tablet Commonly known as: ZESTRIL Take 1 tablet (40 mg total) by mouth daily.   metoprolol tartrate 50 MG tablet Commonly known as: LOPRESSOR Take 1 tablet (50 mg total) by mouth 2 (two) times daily.   Vitamin D3 250 MCG (10000 UT) capsule Take 10,000 Units by mouth daily.       History (reviewed): Past Medical History:  Diagnosis Date  . Arthritis   . Atrial fibrillation ([place])    postoperative  . CVA (cerebral vascular accident) ([place])   . HTN (hypertension)   . MR (mitral regurgitation)    severe  . Pericarditis    postoperative   Past Surgical History:  Procedure Laterality Date  . ANTERIOR AND POSTERIOR VAGINAL REPAIR  10/15/04   Dr. Nori Riis   . anterior repair with perigee graft  04/16/06  posterior repair with apogee graft; lynx mid urethral sling; sacrospinous ligamnet suspension and cystoscopy; Dr. Joya Martyr  . MITRAL VALVE REPLACEMENT    . sacral spinous ligament suspension of vagina    . suprapubic cystectomy    . TOTAL ABDOMINAL HYSTERECTOMY W/ BILATERAL SALPINGOOPHORECTOMY     Family History  Problem Relation Age of Onset  . Heart disease Father   . Lupus Father   . Heart attack Father   . Heart disease Mother   . Colon cancer Brother    Social History   Socioeconomic History  .  Marital status: Widowed    Spouse name: Not on file  . Number of children: 3  . Years of education: Not on file  . Highest education level: 9th grade  Occupational History  . Occupation: Retired  Scientific laboratory technician  . Financial resource strain: Not hard at all  . Food insecurity    Worry: Never true    Inability: Never true  . Transportation needs    Medical: No    Non-medical: No  Tobacco Use  . Smoking status: Never Smoker  . Smokeless tobacco: Never Used  Substance and Sexual Activity  . Alcohol use: No  . Drug use: No  . Sexual activity: Not on file  Lifestyle  . Physical activity    Days per week: 0 days    Minutes per session: 0 min  . Stress: Not at all  Relationships  . Social connections    Talks on phone: More than three times a week    Gets together: More than three times a week    Attends religious service: More than 4 times per year    Active member of club or organization: Yes    Attends meetings of clubs or organizations: More than 4 times per year    Relationship status: Widowed  Other Topics Concern  . Not on file  Social History Narrative   Widowed, retired.     Activities of Daily Living In your present state of health, do you have any difficulty performing the following activities: 08/24/2018  Hearing? N  Vision? N  Difficulty concentrating or making decisions? Y  Walking or climbing stairs? N  Dressing or bathing? N  Doing errands, shopping? N  Preparing Food and eating ? N  Using the Toilet? N  In the past six months, have you accidently leaked urine? N  Do you have problems with loss of bowel control? N  Managing your Medications? N  Managing your Finances? N  Housekeeping or managing your Housekeeping? N  Some recent data might be hidden    Patient Literacy How often do you need to have someone help you when you read instructions, pamphlets, or other written materials from your doctor or pharmacy?: 1 - Never  Exercise Current Exercise  Habits: The patient does not participate in regular exercise at present  Diet Patient reports consuming 2 meals a day and 1 snack(s) a day Patient reports that her primary diet is: Regular Patient reports that she does have regular access to food.   Depression Screen PHQ 2/9 Scores 08/24/2018 07/07/2017 06/18/2017 06/03/2017 01/06/2017 10/19/2015 05/22/2015  PHQ - 2 Score 0 0 0 0 - 0 0  Exception Documentation - - - - Other- indicate reason in comment box - -  Not completed - - - - Patient had a son to pass away recently. - -     Fall Risk Fall Risk  08/24/2018 07/07/2017 06/18/2017 06/03/2017 01/06/2017  Falls  in the past year? 0 Yes Yes No No  Number falls in past yr: 0 1 1 - -  Injury with Fall? 0 Yes Yes - -  Comment - - skin tear to right arm - -     Objective:  [name] seemed alert and oriented and she participated appropriately during our telephone visit.  Blood Pressure Weight BMI  BP Readings from Last 3 Encounters:  12/02/17 134/80  07/07/17 106/67  06/18/17 130/67   Wt Readings from Last 3 Encounters:  08/24/18 99 lb 3.2 oz (45 kg)  12/02/17 99 lb 3.2 oz (45 kg)  07/07/17 100 lb (45.4 kg)   BMI Readings from Last 1 Encounters:  08/24/18 17.57 kg/m    *Unable to obtain current vital signs, weight, and BMI due to telephone visit type  Hearing/Vision  . [name] did not seem to have difficulty with hearing/understanding during the telephone conversation . Reports that she has not had a formal eye exam by an eye care professional within the past year . Reports that she has not had a formal hearing evaluation within the past year *Unable to fully assess hearing and vision during telephone visit type  Cognitive Function: 6CIT Screen 08/24/2018  What Year? 0 points  What month? 0 points  What time? 0 points  Count back from 20 0 points  Months in reverse 0 points  Repeat phrase 0 points  Total Score 0   (Normal:0-7, Significant for Dysfunction: >8)  Normal  Cognitive Function Screening: Yes   Immunization & Health Maintenance Record Immunization History  Administered Date(s) Administered  . Influenza, High Dose Seasonal PF 01/22/2016, 01/06/2017, 02/09/2018  . Influenza,inj,Quad PF,6+ Mos 12/27/2012, 12/06/2013, 03/16/2015  . Influenza-Unspecified 01/02/2009, 01/18/2010, 02/14/2011  . Pneumococcal Conjugate-13 12/06/2013  . Td 06/15/2017    Health Maintenance  Topic Date Due  . DEXA SCAN  05/22/1995  . PNA vac Low Risk Adult (2 of 2 - PPSV23) 12/07/2014  . INFLUENZA VACCINE  10/09/2018  . TETANUS/TDAP  06/16/2027       Assessment  This is a routine wellness examination for [name].  Health Maintenance: Due or Overdue Health Maintenance Due  Topic Date Due  . DEXA SCAN  05/22/1995  . PNA vac Low Risk Adult (2 of 2 - PPSV23) 12/07/2014    [name] does not need a referral for Community Assistance: Care Management:   no Social Work:    no Prescription Assistance:  no Nutrition/Diabetes Education:  no   Plan:  Personalized Goals Goals Addressed            This Visit's Progress   . Exercise 3x per week (30 min per time)       Try to exercise for at least 38mins, 3 times weekly      Personalized Health Maintenance & Screening Recommendations  Pneumococcal vaccine  Bone densitometry screening  Lung Cancer Screening Recommended: no (Low Dose CT Chest recommended if Age 33-80 years, 30 pack-year currently smoking OR have quit w/in past 15 years) Hepatitis C Screening recommended: no HIV Screening recommended: no  Advanced Directives: Written information was prepared per patient's request.  Referrals & Orders No orders of the defined types were placed in this encounter.   Follow-up Plan . Follow-up with [name], MD as planned   I have personally reviewed and noted the following in the patient's chart:   . Medical and social history . Use of alcohol, tobacco or illicit drugs  . Current  medications and supplements . Functional ability and status . Nutritional status . Physical activity . Advanced directives . List of other physicians . Hospitalizations, surgeries, and ER visits in previous 12 months . Vitals . Screenings to include cognitive, depression, and falls . Referrals and appointments  In addition, I have reviewed and discussed with [name] certain preventive protocols, quality metrics, and best practice recommendations. A written personalized care plan for preventive services as well as general preventive health recommendations is available and can be mailed to the patient at her request.      Wardell Heath, LPN  9/90/9400

## 2018-12-10 ENCOUNTER — Other Ambulatory Visit: Payer: Self-pay

## 2018-12-13 ENCOUNTER — Ambulatory Visit (INDEPENDENT_AMBULATORY_CARE_PROVIDER_SITE_OTHER): Payer: Medicare Other

## 2018-12-13 ENCOUNTER — Other Ambulatory Visit: Payer: Self-pay

## 2018-12-13 DIAGNOSIS — Z23 Encounter for immunization: Secondary | ICD-10-CM | POA: Diagnosis not present

## 2018-12-18 ENCOUNTER — Other Ambulatory Visit: Payer: Self-pay | Admitting: Cardiovascular Disease

## 2018-12-27 ENCOUNTER — Other Ambulatory Visit: Payer: Self-pay | Admitting: Cardiovascular Disease

## 2018-12-30 NOTE — Progress Notes (Signed)
Patient ID: [name], female   DOB: 03-Jan-1929, 85 y.o.   MRN: [id number]     85 y.o.  F/u MV disease. I took care of her husband [name] who passed from lung cancer 3 years ago after 26 years of marriage Last echo done 12/03/11 with EF 60% mild AR MV repair normal with no MR and mild LAE  Still driving Needs refill on BP meds  Has some varicose veins that look bad but don't hurt and no edema   Son [name] tragically died in a chain saw accident 2017-01-04. Two daughters one near [place] and one close by With another son living with her but [name] was the glue for the family  Has lost some weight due to depression    ROS: Denies fever, malais, weight loss, blurry vision, decreased visual acuity, cough, sputum, SOB, hemoptysis, pleuritic pain, palpitaitons, heartburn, abdominal pain, melena, lower extremity edema, claudication, or rash.  All other systems reviewed and negative  General: BP 134/62   Pulse 62   Ht 5\' 3"  (1.6 m)   Wt 103 lb (46.7 kg)   SpO2 97%   BMI 18.25 kg/m  Affect appropriate Healthy:  Elderly white female  HEENT: normal Neck supple with no adenopathy JVP normal no bruits no thyromegaly Lungs clear with no wheezing and good diaphragmatic motion Heart:  S1/S2 no murmur, no rub, gallop or click PMI normal post sternotomy  Abdomen: benighn, BS positve, no tenderness, no AAA no bruit.  No HSM or HJR Distal pulses intact with no bruits No edema Neuro non-focal Skin warm and dry No muscular weakness     Current Outpatient Medications  Medication Sig Dispense Refill  . Cholecalciferol (VITAMIN D3) 10000 UNITS capsule Take 10,000 Units by mouth daily.      . ferrous gluconate (FERGON) 324 MG tablet Take 1 tablet (324 mg total) by mouth daily with breakfast. 90 tablet 3  . hydrochlorothiazide (HYDRODIURIL) 25 MG tablet Take 1 tablet (25 mg total) by mouth daily. Please keep upcoming appt in 01-05-23 with Dr. Johnsie Cancel before anymore refills. Thank you 90  tablet 0  . IRON PO Take 65 mg by mouth daily.    Marland Kitchen lisinopril (PRINIVIL,ZESTRIL) 40 MG tablet Take 1 tablet (40 mg total) by mouth daily. 90 tablet 3  . metoprolol tartrate (LOPRESSOR) 50 MG tablet Take 1 tablet (50 mg total) by mouth 2 (two) times daily. 180 tablet 3   No current facility-administered medications for this visit.     Allergies  Aspirin-caffeine, Codeine, and Sulfonamide derivatives  Electrocardiogram:   12/02/17  SR rate 62 LAD RBBB  Assessment and Plan  MV Repair:  SBE  Will update echo   HTN: Well controlled.  Continue current medications and low sodium Dash type diet.   Thyroid:  Continue current replacement dose consider increasing by 25ug Lab Results  Component Value Date   TSH 1.560 01/06/2017   PAF:  Maint NSR no palpitations ECG looks fine no change from 04-Jun-2013   Varicose Veins:  Only painful on long trips encouraged her to wear support hose  Depression:  Husband [name] died of lung cancer Jun 05, 2015 Son [name] died Jan 04, 2017   [name]

## 2019-01-03 ENCOUNTER — Encounter: Payer: Self-pay | Admitting: Cardiovascular Disease

## 2019-01-03 ENCOUNTER — Other Ambulatory Visit: Payer: Self-pay

## 2019-01-03 ENCOUNTER — Ambulatory Visit (INDEPENDENT_AMBULATORY_CARE_PROVIDER_SITE_OTHER): Payer: Medicare Other | Admitting: Cardiovascular Disease

## 2019-01-03 VITALS — BP 134/62 | HR 62 | Ht 63.0 in | Wt 103.0 lb

## 2019-01-03 DIAGNOSIS — Z9889 Other specified postprocedural states: Secondary | ICD-10-CM | POA: Diagnosis not present

## 2019-01-03 DIAGNOSIS — I480 Paroxysmal atrial fibrillation: Secondary | ICD-10-CM

## 2019-01-03 DIAGNOSIS — I4510 Unspecified right bundle-branch block: Secondary | ICD-10-CM | POA: Diagnosis not present

## 2019-01-03 DIAGNOSIS — R9431 Abnormal electrocardiogram [ECG] [EKG]: Secondary | ICD-10-CM | POA: Diagnosis not present

## 2019-01-03 NOTE — Patient Instructions (Signed)
Medication Instructions:   *If you need a refill on your cardiac medications before your next appointment, please call your pharmacy*  Lab Work:  If you have labs (blood work) drawn today and your tests are completely normal, you will receive your results only by: Marland Kitchen MyChart Message (if you have MyChart) OR . A paper copy in the mail If you have any lab test that is abnormal or we need to change your treatment, we will call you to review the results.  Testing/Procedures: Your physician has requested that you have an echocardiogram. Echocardiography is a painless test that uses sound waves to create images of your heart. It provides your doctor with information about the size and shape of your heart and how well your heart's chambers and valves are working. This procedure takes approximately one hour. There are no restrictions for this procedure.  Follow-Up: At [facility], you and your health needs are our priority.  As part of our continuing mission to provide you with exceptional heart care, we have created designated Provider Care Teams.  These Care Teams include your primary Cardiologist (physician) and Advanced Practice Providers (APPs -  Physician Assistants and Nurse Practitioners) who all work together to provide you with the care you need, when you need it.  Your next appointment:   6 months  The format for your next appointment:   In Person  Provider:   You may see Dr. Johnsie Cancel or one of the following Advanced Practice Providers on your designated Care Team:    Truitt Merle, NP  Cecilie Kicks, NP  Kathyrn Drown, NP

## 2019-01-12 ENCOUNTER — Other Ambulatory Visit: Payer: Self-pay

## 2019-01-12 ENCOUNTER — Ambulatory Visit (HOSPITAL_COMMUNITY): Payer: Medicare Other | Attending: Cardiovascular Disease

## 2019-01-12 DIAGNOSIS — I480 Paroxysmal atrial fibrillation: Secondary | ICD-10-CM | POA: Diagnosis not present

## 2019-01-12 DIAGNOSIS — Z9889 Other specified postprocedural states: Secondary | ICD-10-CM | POA: Insufficient documentation

## 2019-02-04 DIAGNOSIS — R197 Diarrhea, unspecified: Secondary | ICD-10-CM | POA: Diagnosis not present

## 2019-02-04 DIAGNOSIS — K529 Noninfective gastroenteritis and colitis, unspecified: Secondary | ICD-10-CM | POA: Diagnosis not present

## 2019-02-08 ENCOUNTER — Encounter: Payer: Self-pay | Admitting: Family Medicine

## 2019-02-08 ENCOUNTER — Ambulatory Visit (INDEPENDENT_AMBULATORY_CARE_PROVIDER_SITE_OTHER): Payer: Medicare Other | Admitting: Family Medicine

## 2019-02-08 DIAGNOSIS — U071 COVID-19: Secondary | ICD-10-CM

## 2019-02-08 NOTE — Progress Notes (Signed)
Virtual Visit via Telephone Note  I connected with Laurana Magistro Bramble's daughter [name] on 02/08/19 at 4:54 PM by telephone and verified that I am speaking with the correct person using two identifiers. Danise Dehne Amstutz's daughter is currently located at home and nobody is currently with her during this visit. The provider, Loman Brooklyn, FNP is located in their office at time of visit.  I discussed the limitations, risks, security and privacy concerns of performing an evaluation and management service by telephone and the availability of in person appointments. I also discussed with the patient that there may be a patient responsible charge related to this service. The patient expressed understanding and agreed to proceed.  Subjective: PCP: [name], MD  Chief Complaint  Patient presents with  . URI  . Diarrhea   Patient's daughter reports Ms. Prosser was diagnosed with Covid last week.  She had diarrhea from Tuesday to Friday at which point she just quit eating.  She did not have diarrhea again until today but was not eating until today.  Reports that she did not know what to do so she called EMS.  They are there at the house right now loading the patient to go to [place]: Per HPI  Current Outpatient Medications:  .  Cholecalciferol (VITAMIN D3) 10000 UNITS capsule, Take 10,000 Units by mouth daily.  , Disp: , Rfl:  .  ferrous gluconate (FERGON) 324 MG tablet, Take 1 tablet (324 mg total) by mouth daily with breakfast., Disp: 90 tablet, Rfl: 3 .  hydrochlorothiazide (HYDRODIURIL) 25 MG tablet, Take 1 tablet (25 mg total) by mouth daily. Please keep upcoming appt in October with Dr. Johnsie Cancel before anymore refills. Thank you, Disp: 90 tablet, Rfl: 0 .  IRON PO, Take 65 mg by mouth daily., Disp: , Rfl:  .  lisinopril (PRINIVIL,ZESTRIL) 40 MG tablet, Take 1 tablet (40 mg total) by mouth daily., Disp: 90 tablet, Rfl: 3 .  metoprolol tartrate (LOPRESSOR) 50 MG tablet, Take 1 tablet  (50 mg total) by mouth 2 (two) times daily., Disp: 180 tablet, Rfl: 3  Allergies  Allergen Reactions  . Aspirin-Caffeine     Upset stomach   . Codeine     REACTION: upset stomach  . Sulfonamide Derivatives     REACTION: swollen tongue   Past Medical History:  Diagnosis Date  . Arthritis   . Atrial fibrillation ([place])    postoperative  . COVID-19   . CVA (cerebral vascular accident) ([place])   . HTN (hypertension)   . MR (mitral regurgitation)    severe  . Pericarditis    postoperative    Observations/Objective: Unable to assess patient as I was speaking with daughter.  Assessment and Plan: 1. COVID-19 - Patient is currently being loaded into EMS and transported to [facility].    Follow Up Instructions:  I discussed the assessment and treatment plan with the patient. The patient was provided an opportunity to ask questions and all were answered. The patient agreed with the plan and demonstrated an understanding of the instructions.   The patient was advised to call back or seek an in-person evaluation if the symptoms worsen or if the condition fails to improve as anticipated.  The above assessment and management plan was discussed with the patient. The patient verbalized understanding of and has agreed to the management plan. Patient is aware to call the clinic if symptoms persist or worsen. Patient is aware when to return to the clinic  for a follow-up visit. Patient educated on when it is appropriate to go to the emergency department.   Time call ended: 4:59 PM  I provided 6 minutes of non-face-to-face time during this encounter.  Hendricks Limes, MSN, APRN, FNP-C [facility] 02/08/19

## 2019-02-13 DIAGNOSIS — Z79899 Other long term (current) drug therapy: Secondary | ICD-10-CM | POA: Diagnosis not present

## 2019-02-13 DIAGNOSIS — R197 Diarrhea, unspecified: Secondary | ICD-10-CM | POA: Diagnosis not present

## 2019-02-13 DIAGNOSIS — E860 Dehydration: Secondary | ICD-10-CM | POA: Diagnosis not present

## 2019-02-13 DIAGNOSIS — J9601 Acute respiratory failure with hypoxia: Secondary | ICD-10-CM | POA: Diagnosis not present

## 2019-02-13 DIAGNOSIS — J159 Unspecified bacterial pneumonia: Secondary | ICD-10-CM | POA: Diagnosis present

## 2019-02-13 DIAGNOSIS — R339 Retention of urine, unspecified: Secondary | ICD-10-CM | POA: Diagnosis present

## 2019-02-13 DIAGNOSIS — Z882 Allergy status to sulfonamides status: Secondary | ICD-10-CM | POA: Diagnosis not present

## 2019-02-13 DIAGNOSIS — Z66 Do not resuscitate: Secondary | ICD-10-CM | POA: Diagnosis present

## 2019-02-13 DIAGNOSIS — U071 COVID-19: Secondary | ICD-10-CM | POA: Diagnosis not present

## 2019-02-13 DIAGNOSIS — I480 Paroxysmal atrial fibrillation: Secondary | ICD-10-CM | POA: Diagnosis present

## 2019-02-13 DIAGNOSIS — Z049 Encounter for examination and observation for unspecified reason: Secondary | ICD-10-CM | POA: Diagnosis not present

## 2019-02-13 DIAGNOSIS — R911 Solitary pulmonary nodule: Secondary | ICD-10-CM | POA: Diagnosis present

## 2019-02-13 DIAGNOSIS — E871 Hypo-osmolality and hyponatremia: Secondary | ICD-10-CM | POA: Diagnosis not present

## 2019-02-13 DIAGNOSIS — E441 Mild protein-calorie malnutrition: Secondary | ICD-10-CM | POA: Diagnosis present

## 2019-02-13 DIAGNOSIS — Z681 Body mass index (BMI) 19 or less, adult: Secondary | ICD-10-CM | POA: Diagnosis not present

## 2019-02-13 DIAGNOSIS — R778 Other specified abnormalities of plasma proteins: Secondary | ICD-10-CM | POA: Diagnosis not present

## 2019-02-13 DIAGNOSIS — Z885 Allergy status to narcotic agent status: Secondary | ICD-10-CM | POA: Diagnosis not present

## 2019-02-13 DIAGNOSIS — J1289 Other viral pneumonia: Secondary | ICD-10-CM | POA: Diagnosis present

## 2019-02-13 DIAGNOSIS — R531 Weakness: Secondary | ICD-10-CM | POA: Diagnosis not present

## 2019-02-13 DIAGNOSIS — I10 Essential (primary) hypertension: Secondary | ICD-10-CM | POA: Diagnosis not present

## 2019-02-13 DIAGNOSIS — R630 Anorexia: Secondary | ICD-10-CM | POA: Diagnosis not present

## 2019-02-13 DIAGNOSIS — N179 Acute kidney failure, unspecified: Secondary | ICD-10-CM | POA: Diagnosis not present

## 2019-02-13 DIAGNOSIS — R112 Nausea with vomiting, unspecified: Secondary | ICD-10-CM | POA: Diagnosis not present

## 2019-02-21 ENCOUNTER — Telehealth: Payer: Self-pay | Admitting: Family Medicine

## 2019-02-21 NOTE — Telephone Encounter (Signed)
The tamsulosin helps with urine flow. This tends to be common in men with large prostates. However, since she had renal failure in the hospital, it is likely they wanted to be sure she was able to pass her urine to keep her kidneys flushed. Go ahead with it until her 12/23 visit. At that time, we can consider DC of the med. She should have her blood drawn a day or two in advance so I can assess kidney function while she is here. Thanks, WS

## 2019-02-21 NOTE — Telephone Encounter (Signed)
Patient aware and verbalized understanding. °

## 2019-02-21 NOTE — Telephone Encounter (Signed)
Patients grandaughter would like to be called back regarding the Flomax Rx that patient was recently given.

## 2019-02-21 NOTE — Telephone Encounter (Signed)
Patient was [name] they put her on Flomax every other day. Patient's granddaughter is concerned due to it usually for it treating PSA. Pt was in there for COVID. Patients Kidney function was down. Records in care everywhere

## 2019-02-28 ENCOUNTER — Other Ambulatory Visit: Payer: Self-pay | Admitting: Cardiovascular Disease

## 2019-03-02 ENCOUNTER — Encounter: Payer: Self-pay | Admitting: Family Medicine

## 2019-03-02 ENCOUNTER — Ambulatory Visit (INDEPENDENT_AMBULATORY_CARE_PROVIDER_SITE_OTHER): Payer: Medicare Other | Admitting: Family Medicine

## 2019-03-02 DIAGNOSIS — I340 Nonrheumatic mitral (valve) insufficiency: Secondary | ICD-10-CM | POA: Diagnosis not present

## 2019-03-02 DIAGNOSIS — R600 Localized edema: Secondary | ICD-10-CM | POA: Diagnosis not present

## 2019-03-02 NOTE — Progress Notes (Signed)
    Subjective:    Patient ID: [name], female    DOB: [birth year], 85 y.o.   MRN: [id number]   HPI: [name] is a 85 y.o. female presenting for recheck of hospitalization due to CoVID. Now off of the HCTZ and swelling in her legs and feet. When can I go get my hair done? Appetite is back. Eating well. Taking good amount of fluids.Tires out easily.Gets short of breath if she talks a lot. Has a little baseline cough.    Depression screen [facility] 2/9 08/24/2018 07/07/2017 06/18/2017 06/03/2017 10/19/2015  Decreased Interest 0 0 0 0 0  Down, Depressed, Hopeless 0 0 0 0 0  PHQ - 2 Score 0 0 0 0 0     Relevant past medical, surgical, family and social history reviewed and updated as indicated.  Interim medical history since our last visit reviewed. Allergies and medications reviewed and updated.  ROS:  Review of Systems  Constitutional: Negative.   HENT: Negative for congestion.   Eyes: Negative for visual disturbance.  Respiratory: Positive for shortness of breath.   Cardiovascular: Positive for leg swelling. Negative for chest pain and palpitations.  Gastrointestinal: Negative for abdominal pain, constipation, diarrhea, nausea and vomiting.  Genitourinary: Negative for difficulty urinating.  Musculoskeletal: Positive for arthralgias. Negative for myalgias.  Neurological: Negative for headaches.  Psychiatric/Behavioral: Negative for sleep disturbance.     Social History   Tobacco Use  Smoking Status Never Smoker  Smokeless Tobacco Never Used       Objective:     Wt Readings from Last 3 Encounters:  01/03/19 103 lb (46.7 kg)  08/24/18 99 lb 3.2 oz (45 kg)  12/02/17 99 lb 3.2 oz (45 kg)     Exam deferred. Pt. Harboring due to COVID 19. Phone visit performed.   Assessment & Plan:   1. Localized edema   2. Mitral valve insufficiency, unspecified etiology     No orders of the defined types were placed in this encounter.   No orders of the defined types were placed  in this encounter.   Decrease HCTZ to 1/2 daily. Keep feet up if swelling.  DC Tamsulosin.  Diagnoses and all orders for this visit:  Localized edema  Mitral valve insufficiency, unspecified etiology   .  Virtual Visit via telephone Note  I discussed the limitations, risks, security and privacy concerns of performing an evaluation and management service by telephone and the availability of in person appointments. The patient was identified with two identifiers. Pt.expressed understanding and agreed to proceed. Pt. Is at home. Daughter [name]  Dr. Livia Snellen is in his office.  Follow Up Instructions:   I discussed the assessment and treatment plan with the patient. The patient was provided an opportunity to ask questions and all were answered. The patient agreed with the plan and demonstrated an understanding of the instructions.   The patient was advised to call back or seek an in-person evaluation if the symptoms worsen or if the condition fails to improve as anticipated.   Total minutes including chart review and phone contact time: 30   Follow up plan: Return in about 1 month (around 04/02/2019).  [name], MD [place]

## 2019-03-13 ENCOUNTER — Other Ambulatory Visit: Payer: Self-pay | Admitting: Cardiovascular Disease

## 2019-04-04 ENCOUNTER — Other Ambulatory Visit: Payer: Self-pay

## 2019-04-05 ENCOUNTER — Ambulatory Visit (INDEPENDENT_AMBULATORY_CARE_PROVIDER_SITE_OTHER): Payer: Medicare Other

## 2019-04-05 ENCOUNTER — Ambulatory Visit (INDEPENDENT_AMBULATORY_CARE_PROVIDER_SITE_OTHER): Payer: Medicare Other | Admitting: Family Medicine

## 2019-04-05 ENCOUNTER — Encounter: Payer: Self-pay | Admitting: Family Medicine

## 2019-04-05 VITALS — BP 138/76 | HR 86 | Temp 97.5°F | Ht 63.0 in | Wt 99.8 lb

## 2019-04-05 DIAGNOSIS — E039 Hypothyroidism, unspecified: Secondary | ICD-10-CM

## 2019-04-05 DIAGNOSIS — R600 Localized edema: Secondary | ICD-10-CM | POA: Diagnosis not present

## 2019-04-05 DIAGNOSIS — M810 Age-related osteoporosis without current pathological fracture: Secondary | ICD-10-CM | POA: Diagnosis not present

## 2019-04-05 DIAGNOSIS — I10 Essential (primary) hypertension: Secondary | ICD-10-CM | POA: Diagnosis not present

## 2019-04-05 DIAGNOSIS — D509 Iron deficiency anemia, unspecified: Secondary | ICD-10-CM | POA: Diagnosis not present

## 2019-04-05 DIAGNOSIS — Z780 Asymptomatic menopausal state: Secondary | ICD-10-CM

## 2019-04-05 DIAGNOSIS — Z23 Encounter for immunization: Secondary | ICD-10-CM

## 2019-04-05 DIAGNOSIS — M8588 Other specified disorders of bone density and structure, other site: Secondary | ICD-10-CM | POA: Diagnosis not present

## 2019-04-05 DIAGNOSIS — I340 Nonrheumatic mitral (valve) insufficiency: Secondary | ICD-10-CM | POA: Diagnosis not present

## 2019-04-05 MED ORDER — METOPROLOL TARTRATE 50 MG PO TABS: 50.0000 mg | ORAL_TABLET | Freq: Two times a day (BID) | ORAL | 0 refills | Status: DC

## 2019-04-05 MED ORDER — HYDROCHLOROTHIAZIDE 25 MG PO TABS: 12.5000 mg | ORAL_TABLET | Freq: Every day | ORAL | 0 refills | Status: DC

## 2019-04-05 MED ORDER — LISINOPRIL 40 MG PO TABS: 40.0000 mg | ORAL_TABLET | Freq: Every day | ORAL | 1 refills | Status: DC

## 2019-04-05 MED ORDER — RISEDRONATE SODIUM 150 MG PO TABS: 150.0000 mg | ORAL_TABLET | ORAL | 3 refills | Status: DC

## 2019-04-05 NOTE — Progress Notes (Signed)
Subjective:  Patient ID: [name], female    DOB: 11-May-1928  Age: 86 y.o. MRN: [id number]  CC: Follow-up (1 month)   HPI [name] presents for  follow-up of hypertension. Patient has no history of headache chest pain or shortness of breath or recent cough. Patient also denies symptoms of TIA such as focal numbness or weakness. Patient denies side effects from medication. States taking it regularly.  Patient also has not had a mammogram or bone density tests recently.  She agrees to get a bone density test done today but she wants to follow-up with her gynecologist for the mammogram.  She will make that her last mammogram with gynecology and then follow here for those in the future.  Patient states that her edema has resolved.  She has no shortness of breath.  She continues to take half of a HydroDIURIL daily.  She is due today for a set of kidney functions.  She also has history of anemia for which she is taking iron.  She is due to have a CBC to check on her hemoglobin today.  Patient is due for pneumonia vaccine.  That will be performed today as well.  Patient relates that she was diagnosed with hypothyroidism several years ago.  She tried the medicine but it made her have a severe ache in her legs.  She is currently having no thyroid symptoms.  This is in spite of getting up the medicine shortly after starting it based on the leg pain.  Specifically no constipation no hair loss no excessive feeling of being cold.  However this does make the need for the DEXA scan even more important since an underactive thyroid could well lead to greater risk from osteoporosis.  Actonel 150 osteoporosis   History [name] has a past medical history of Arthritis, Atrial fibrillation ([place]), COVID-19, CVA (cerebral vascular accident) ([place]), HTN (hypertension), MR (mitral regurgitation), and Pericarditis.   She has a past surgical history that includes anterior repair with perigee graft (04/16/06);  Anterior and posterior vaginal repair (10/15/04); sacral spinous ligament suspension of vagina; suprapubic cystectomy; Total abdominal hysterectomy w/ bilateral salpingoophorectomy; and Mitral valve replacement.   Her family history includes Colon cancer in her brother; Heart attack in her father; Heart disease in her father and mother; Lupus in her father.She reports that she has never smoked. She has never used smokeless tobacco. She reports that she does not drink alcohol or use drugs.  Current Outpatient Medications on File Prior to Visit  Medication Sig Dispense Refill  . Cholecalciferol (VITAMIN D3) 10000 UNITS capsule Take 10,000 Units by mouth daily.      Marland Kitchen lisinopril (ZESTRIL) 40 MG tablet Take 1 tablet by mouth once daily 90 tablet 1  . metoprolol tartrate (LOPRESSOR) 50 MG tablet Take 1 tablet by mouth twice daily 180 tablet 0  . IRON PO Take 65 mg by mouth daily.     No current facility-administered medications on file prior to visit.    ROS Review of Systems  Objective:  BP 138/76   Pulse 86   Temp (!) 97.5 F (36.4 C) (Temporal)   Ht 5' 3" (1.6 m)   Wt 99 lb 12.8 oz (45.3 kg)   BMI 17.68 kg/m   BP Readings from Last 3 Encounters:  04/05/19 138/76  01/03/19 134/62  12/02/17 134/80    Wt Readings from Last 3 Encounters:  04/05/19 99 lb 12.8 oz (45.3 kg)  01/03/19 103 lb (46.7 kg)  08/24/18  99 lb 3.2 oz (45 kg)     Physical Exam    Assessment & Plan:   [name] was seen today for follow-up.  Diagnoses and all orders for this visit:  Hypothyroidism, unspecified type -     CMP14+EGFR -     TSH + free T4  Mitral valve insufficiency, unspecified etiology -     CMP14+EGFR  Essential hypertension -     CMP14+EGFR  Post-menopause -     DG Bone Density; Future  Localized edema -     CMP14+EGFR  Iron deficiency anemia, unspecified iron deficiency anemia type -     CBC with Differential/Platelet  Other orders -     Pneumococcal polysaccharide vaccine  23-valent greater than or equal to 2yo subcutaneous/IM -     hydrochlorothiazide (HYDRODIURIL) 25 MG tablet; Take 0.5 tablets (12.5 mg total) by mouth daily.   Allergies as of 04/05/2019      Reactions   Aspirin-caffeine    Upset stomach   Codeine    REACTION: upset stomach   Sulfonamide Derivatives    REACTION: swollen tongue      Medication List       Accurate as of April 05, 2019 12:54 PM. If you have any questions, ask your nurse or doctor.        STOP taking these medications   ferrous gluconate 324 MG tablet Commonly known as: FERGON Stopped by: [name], MD     TAKE these medications   hydrochlorothiazide 25 MG tablet Commonly known as: HYDRODIURIL Take 0.5 tablets (12.5 mg total) by mouth daily. What changed:   how much to take  additional instructions Changed by: [name], MD   IRON PO Take 65 mg by mouth daily.   lisinopril 40 MG tablet Commonly known as: ZESTRIL Take 1 tablet by mouth once daily   metoprolol tartrate 50 MG tablet Commonly known as: LOPRESSOR Take 1 tablet by mouth twice daily   Vitamin D3 250 MCG (10000 UT) capsule Take 10,000 Units by mouth daily.       Meds ordered this encounter  Medications  . hydrochlorothiazide (HYDRODIURIL) 25 MG tablet    Sig: Take 0.5 tablets (12.5 mg total) by mouth daily.    Dispense:  90 tablet    Refill:  0    Patient's bone density scan was completed during her stay.  She will be notified by phone of the need for Actonel due to osteoporosis being noted at the hips.  Follow-up: Return in about 6 months (around 10/03/2019).  [name], M.D.

## 2019-04-06 LAB — CMP14+EGFR
ALT: 8 IU/L (ref 0–32)
AST: 18 IU/L (ref 0–40)
Albumin/Globulin Ratio: 1.6 (ref 1.2–2.2)
Albumin: 4.2 g/dL (ref 3.6–4.6)
Alkaline Phosphatase: 73 IU/L (ref 39–117)
BUN/Creatinine Ratio: 19 (ref 12–28)
BUN: 22 mg/dL (ref 8–27)
Bilirubin Total: 0.5 mg/dL (ref 0.0–1.2)
CO2: 23 mmol/L (ref 20–29)
Calcium: 10.1 mg/dL (ref 8.7–10.3)
Chloride: 103 mmol/L (ref 96–106)
Creatinine, Ser: 1.13 mg/dL — ABNORMAL HIGH (ref 0.57–1.00)
GFR calc Af Amer: 50 mL/min/{1.73_m2} — ABNORMAL LOW (ref >59)
GFR calc non Af Amer: 43 mL/min/{1.73_m2} — ABNORMAL LOW (ref >59)
Globulin, Total: 2.6 g/dL (ref 1.5–4.5)
Glucose: 89 mg/dL (ref 65–99)
Potassium: 4.3 mmol/L (ref 3.5–5.2)
Sodium: 141 mmol/L (ref 134–144)
Total Protein: 6.8 g/dL (ref 6.0–8.5)

## 2019-04-06 LAB — CBC WITH DIFFERENTIAL/PLATELET
Basophils Absolute: 0.1 10*3/uL (ref 0.0–0.2)
Basos: 1 %
EOS (ABSOLUTE): 0.2 10*3/uL (ref 0.0–0.4)
Eos: 3 %
Hematocrit: 35.6 % (ref 34.0–46.6)
Hemoglobin: 11.7 g/dL (ref 11.1–15.9)
Immature Grans (Abs): 0 10*3/uL (ref 0.0–0.1)
Immature Granulocytes: 1 %
Lymphocytes Absolute: 1.3 10*3/uL (ref 0.7–3.1)
Lymphs: 17 %
MCH: 30.2 pg (ref 26.6–33.0)
MCHC: 32.9 g/dL (ref 31.5–35.7)
MCV: 92 fL (ref 79–97)
Monocytes Absolute: 0.6 10*3/uL (ref 0.1–0.9)
Monocytes: 7 %
Neutrophils Absolute: 5.7 10*3/uL (ref 1.4–7.0)
Neutrophils: 71 %
Platelets: 252 10*3/uL (ref 150–450)
RBC: 3.87 x10E6/uL (ref 3.77–5.28)
RDW: 13.8 % (ref 11.7–15.4)
WBC: 7.9 10*3/uL (ref 3.4–10.8)

## 2019-04-06 LAB — TSH+FREE T4
Free T4: 1.06 ng/dL (ref 0.82–1.77)
TSH: 2.61 u[IU]/mL (ref 0.450–4.500)

## 2019-05-11 DIAGNOSIS — Z23 Encounter for immunization: Secondary | ICD-10-CM | POA: Diagnosis not present

## 2019-05-26 ENCOUNTER — Telehealth: Payer: Self-pay | Admitting: Family Medicine

## 2019-05-26 NOTE — Chronic Care Management (AMB) (Signed)
  Chronic Care Management   Note  05/26/2019 Name: BRIGHTEN BUZZELLI MRN: [id number] DOB: 1928/11/13  EDGAR CORRIGAN is a 86 y.o. year old female who is a primary care patient of Stacks, Cletus Gash, MD. I reached out to [name] by phone today in response to a referral sent by Ms. Cabool health plan.     Ms. Vent was given information about Chronic Care Management services today including:  1. CCM service includes personalized support from designated clinical staff supervised by her physician, including individualized plan of care and coordination with other care providers 2. 24/7 contact phone numbers for assistance for urgent and routine care needs. 3. Service will only be billed when office clinical staff spend 20 minutes or more in a month to coordinate care. 4. Only one practitioner may furnish and bill the service in a calendar month. 5. The patient may stop CCM services at any time (effective at the end of the month) by phone call to the office staff. 6. The patient will be responsible for cost sharing (co-pay) of up to 20% of the service fee (after annual deductible is met).  Patient agreed to services and verbal consent obtained.   Follow up plan: Telephone appointment with care management team member scheduled for:10/24/2019.  [place], [place] [address] Direct Dial: [phone] [email or web address]: [email or web address]

## 2019-05-26 NOTE — Progress Notes (Signed)
Opened in Error.

## 2019-06-08 DIAGNOSIS — Z23 Encounter for immunization: Secondary | ICD-10-CM | POA: Diagnosis not present

## 2019-07-08 ENCOUNTER — Ambulatory Visit: Payer: Medicare Other | Admitting: Cardiovascular Disease

## 2019-08-04 NOTE — Progress Notes (Signed)
Patient ID: [name], female   DOB: 1929-01-16, 86 y.o.   MRN: [id number]     86 y.o.  F/u MV disease. I took care of her husband [name] who passed from lung cancer 4 years ago after 55 years of marriage Last echo done11/4/20  with EF 60% mild AR MV repair normal with trace  MR and normal  LA   Still driving Has some varicose veins that look bad but don't hurt and no edema   Son [name] tragically died in a chain saw accident Jan 19, 2017. Two daughters one near [place] and one close by With another son living with her but [name] was the glue for the family  Has lost some weight due to depression   Had COVID and hospitalized for 6 days Doing well and had vaccine 90 days latter Oldest son [name] staying with her had some heart issues   ROS: Denies fever, malais, weight loss, blurry vision, decreased visual acuity, cough, sputum, SOB, hemoptysis, pleuritic pain, palpitaitons, heartburn, abdominal pain, melena, lower extremity edema, claudication, or rash.  All other systems reviewed and negative  General: BP 120/62    Pulse 61    Ht 5\' 3"  (1.6 m)    Wt 103 lb (46.7 kg)    SpO2 98%    BMI 18.25 kg/m  Affect appropriate Healthy:  Elderly white female  HEENT: normal Neck supple with no adenopathy JVP normal no bruits no thyromegaly Lungs clear with no wheezing and good diaphragmatic motion Heart:  S1/S2 no murmur, no rub, gallop or click PMI normal post sternotomy  Abdomen: benighn, BS positve, no tenderness, no AAA no bruit.  No HSM or HJR Distal pulses intact with no bruits No edema Neuro non-focal Skin warm and dry No muscular weakness   Current Outpatient Medications  Medication Sig Dispense Refill   amoxicillin (AMOXIL) 500 MG tablet Take 500 mg by mouth. Take four tablets 1 hour before dental procedures     Biotin 1000 MCG tablet Take 1,000 mcg by mouth daily.     Cholecalciferol (VITAMIN D3) 10000 UNITS capsule Take 10,000 Units by mouth daily.        hydrochlorothiazide (HYDRODIURIL) 25 MG tablet Take 0.5 tablets (12.5 mg total) by mouth daily. 90 tablet 0   lisinopril (ZESTRIL) 40 MG tablet Take 1 tablet (40 mg total) by mouth daily. 90 tablet 1   metoprolol tartrate (LOPRESSOR) 50 MG tablet Take 1 tablet (50 mg total) by mouth 2 (two) times daily. 180 tablet 0   risedronate (ACTONEL) 150 MG tablet Take 1 tablet (150 mg total) by mouth every 30 (thirty) days. with water, on an empty stomach, take nothing by mouth and to not lie down for 30 minutes after each dose. 3 tablet 3   No current facility-administered medications for this visit.    Allergies  Aspirin-caffeine, Codeine, and Sulfonamide derivatives  Electrocardiogram:   12/02/17  SR rate 62 LAD RBBB  Assessment and Plan  MV Repair:  SBE   Echo 01/12/19 with EF 60-65% with only trace MR and normal diastolic gradients   HTN: Well controlled.  Continue current medications and low sodium Dash type diet.   Thyroid:  Continue current replacement dose consider increasing by 25ug Lab Results  Component Value Date   TSH 2.610 04/05/2019   PAF:  Maint NSR no palpitations ECG looks fine no changes noted normal LA size on echo   Varicose Veins:  Only painful on long trips encouraged her to wear  support hose  Depression:  Husband [name] died of lung cancer June 14, 2015 Son [name] died 01-13-2017   [name]

## 2019-08-10 ENCOUNTER — Encounter: Payer: Self-pay | Admitting: Cardiovascular Disease

## 2019-08-10 ENCOUNTER — Ambulatory Visit (INDEPENDENT_AMBULATORY_CARE_PROVIDER_SITE_OTHER): Payer: Medicare Other | Admitting: Cardiovascular Disease

## 2019-08-10 ENCOUNTER — Other Ambulatory Visit: Payer: Self-pay

## 2019-08-10 VITALS — BP 120/62 | HR 61 | Ht 63.0 in | Wt 103.0 lb

## 2019-08-10 DIAGNOSIS — I480 Paroxysmal atrial fibrillation: Secondary | ICD-10-CM

## 2019-08-10 DIAGNOSIS — E039 Hypothyroidism, unspecified: Secondary | ICD-10-CM

## 2019-08-10 DIAGNOSIS — I10 Essential (primary) hypertension: Secondary | ICD-10-CM | POA: Diagnosis not present

## 2019-08-10 DIAGNOSIS — I8390 Asymptomatic varicose veins of unspecified lower extremity: Secondary | ICD-10-CM

## 2019-08-10 DIAGNOSIS — Z9889 Other specified postprocedural states: Secondary | ICD-10-CM | POA: Diagnosis not present

## 2019-08-10 NOTE — Patient Instructions (Signed)
Medication Instructions:  *If you need a refill on your cardiac medications before your next appointment, please call your pharmacy*  Lab Work: If you have labs (blood work) drawn today and your tests are completely normal, you will receive your results only by: Marland Kitchen MyChart Message (if you have MyChart) OR . A paper copy in the mail If you have any lab test that is abnormal or we need to change your treatment, we will call you to review the results.  Testing/Procedures:    Follow-Up: At [facility], you and your health needs are our priority.  As part of our continuing mission to provide you with exceptional heart care, we have created designated Provider Care Teams.  These Care Teams include your primary Cardiologist (physician) and Advanced Practice Providers (APPs -  Physician Assistants and Nurse Practitioners) who all work together to provide you with the care you need, when you need it.  We recommend signing up for the patient portal called "MyChart".  Sign up information is provided on this After Visit Summary.  MyChart is used to connect with patients for Virtual Visits (Telemedicine).  Patients are able to view lab/test results, encounter notes, upcoming appointments, etc.  Non-urgent messages can be sent to your provider as well.   To learn more about what you can do with MyChart, go to [email or web address].    Your next appointment:   6 month(s)  The format for your next appointment:   In Person  Provider:   You may see Dr. Johnsie Cancel or one of the following Advanced Practice Providers on your designated Care Team:    Truitt Merle, NP  Cecilie Kicks, NP  Kathyrn Drown, NP

## 2019-08-20 ENCOUNTER — Other Ambulatory Visit: Payer: Self-pay | Admitting: Cardiovascular Disease

## 2019-08-20 DIAGNOSIS — I10 Essential (primary) hypertension: Secondary | ICD-10-CM

## 2019-09-10 ENCOUNTER — Other Ambulatory Visit: Payer: Self-pay | Admitting: Family Medicine

## 2019-09-10 ENCOUNTER — Other Ambulatory Visit: Payer: Self-pay | Admitting: Cardiovascular Disease

## 2019-09-10 DIAGNOSIS — I10 Essential (primary) hypertension: Secondary | ICD-10-CM

## 2019-09-14 ENCOUNTER — Telehealth: Payer: Self-pay | Admitting: Cardiovascular Disease

## 2019-09-14 NOTE — Telephone Encounter (Signed)
Called pt to inform her that her PCP has already sent her medication hydrochlorothiazide 25 mg tablets into her pharmacy as requested. I advised the pt that if she has any other problems, questions or concerns, to give our office a call. Pt verbalized understanding.

## 2019-09-14 NOTE — Telephone Encounter (Signed)
New message   Pt c/o medication issue:  1. Name of Medication: hydrochlorothiazide (HYDRODIURIL) 25 MG tablet      2. How are you currently taking this medication (dosage and times per day)? As written   3. Are you having a reaction (difficulty breathing--STAT)?no   4. What is your medication issue? Patient needs a new prescription sent to [place], [place]

## 2019-10-04 ENCOUNTER — Ambulatory Visit (INDEPENDENT_AMBULATORY_CARE_PROVIDER_SITE_OTHER): Payer: Medicare Other | Admitting: Family Medicine

## 2019-10-04 ENCOUNTER — Other Ambulatory Visit: Payer: Self-pay

## 2019-10-04 ENCOUNTER — Telehealth: Payer: Self-pay | Admitting: *Deleted

## 2019-10-04 ENCOUNTER — Encounter: Payer: Self-pay | Admitting: *Deleted

## 2019-10-04 ENCOUNTER — Encounter: Payer: Self-pay | Admitting: Family Medicine

## 2019-10-04 ENCOUNTER — Ambulatory Visit: Payer: Medicare Other | Admitting: Family Medicine

## 2019-10-04 VITALS — BP 136/84 | HR 64 | Temp 97.2°F | Ht 63.0 in | Wt 104.8 lb

## 2019-10-04 DIAGNOSIS — E039 Hypothyroidism, unspecified: Secondary | ICD-10-CM

## 2019-10-04 DIAGNOSIS — M810 Age-related osteoporosis without current pathological fracture: Secondary | ICD-10-CM | POA: Diagnosis not present

## 2019-10-04 DIAGNOSIS — I10 Essential (primary) hypertension: Secondary | ICD-10-CM | POA: Diagnosis not present

## 2019-10-04 DIAGNOSIS — D500 Iron deficiency anemia secondary to blood loss (chronic): Secondary | ICD-10-CM

## 2019-10-04 MED ORDER — HYDROCHLOROTHIAZIDE 25 MG PO TABS: ORAL_TABLET | ORAL | 1 refills | Status: DC

## 2019-10-04 MED ORDER — DENOSUMAB 60 MG/ML ~~LOC~~ SOSY: 60.0000 mg | PREFILLED_SYRINGE | Freq: Once | SUBCUTANEOUS | 0 refills | Status: AC

## 2019-10-04 NOTE — Progress Notes (Signed)
Subjective:  Patient ID: [name], female    DOB: Jan 25, 1929  Age: 86 y.o. MRN: [id number]  CC: Follow-up (6 month)   HPI [name] presents for  follow-up of hypertension. Patient has no history of headache chest pain or shortness of breath or recent cough. Patient also denies symptoms of TIA such as focal numbness or weakness. Patient denies side effects from medication. States taking it regularly.  Patient also followed for osteopenia porosis and she says she could not tolerate the Actonel.  The first pill upset her stomach.  The second 1 tore it up and she just could not tolerate it anymore.  She is willing to consider taking Prolia shots if we can get them approved for reasonable cost from her insurance.  She says she would rather take her chances of fracture before she can go back to the Actonel or something similar again.  Patient had been diagnosed with thyroid disease several years ago.  However her last couple of thyroid function tests turned out normal.  She quit taking the medicine a few years ago because that medication hurt her legs.  Patient also had anemia in the past and is to have a check for that.  Her energy is pretty good though she does not feel excessively cold.   History [name] has a past medical history of Arthritis, Atrial fibrillation ([place]), COVID-19, CVA (cerebral vascular accident) ([place]), HTN (hypertension), MR (mitral regurgitation), and Pericarditis.   She has a past surgical history that includes anterior repair with perigee graft (04/16/06); Anterior and posterior vaginal repair (10/15/04); sacral spinous ligament suspension of vagina; suprapubic cystectomy; Total abdominal hysterectomy w/ bilateral salpingoophorectomy; and Mitral valve replacement.   Her family history includes Colon cancer in her brother; Heart attack in her father; Heart disease in her father and mother; Lupus in her father.She reports that she has never smoked. She has never used  smokeless tobacco. She reports that she does not drink alcohol and does not use drugs.  Current Outpatient Medications on File Prior to Visit  Medication Sig Dispense Refill  . amoxicillin (AMOXIL) 500 MG tablet Take 500 mg by mouth. Take four tablets 1 hour before dental procedures    . Biotin 1000 MCG tablet Take 1,000 mcg by mouth daily.    . Cholecalciferol (VITAMIN D3) 10000 UNITS capsule Take 10,000 Units by mouth daily.      Marland Kitchen lisinopril (ZESTRIL) 40 MG tablet Take 1 tablet by mouth once daily 90 tablet 2  . metoprolol tartrate (LOPRESSOR) 50 MG tablet Take 1 tablet by mouth twice daily 180 tablet 3   No current facility-administered medications on file prior to visit.    ROS Review of Systems  Constitutional: Negative.   HENT: Negative for congestion.   Eyes: Negative for visual disturbance.  Respiratory: Negative for shortness of breath.   Cardiovascular: Negative for chest pain.  Gastrointestinal: Negative for abdominal pain, constipation, diarrhea, nausea and vomiting.  Genitourinary: Negative for difficulty urinating.  Musculoskeletal: Negative for arthralgias and myalgias.  Neurological: Negative for headaches.  Psychiatric/Behavioral: Negative for sleep disturbance.    Objective:  BP (!) 136/84   Pulse 64   Temp (!) 97.2 F (36.2 C) (Temporal)   Ht _0  (1.6 m)   Wt 104 lb 12.8 oz (47.5 kg)   BMI 18.56 kg/m   BP Readings from Last 3 Encounters:  10/04/19 (!) 136/84  08/10/19 120/62  04/05/19 138/76    Wt Readings from Last 3 Encounters:  10/04/19 104 lb 12.8 oz (47.5 kg)  08/10/19 103 lb (46.7 kg)  04/05/19 99 lb 12.8 oz (45.3 kg)     Physical Exam Constitutional:      General: She is not in acute distress.    Appearance: She is well-developed.  HENT:     Head: Normocephalic and atraumatic.     Right Ear: External ear normal.     Left Ear: External ear normal.     Nose: Nose normal.  Eyes:     Conjunctiva/sclera: Conjunctivae normal.      Pupils: Pupils are equal, round, and reactive to light.  Neck:     Thyroid: No thyromegaly.  Cardiovascular:     Rate and Rhythm: Normal rate and regular rhythm.     Heart sounds: Normal heart sounds. No murmur heard.   Pulmonary:     Effort: Pulmonary effort is normal. No respiratory distress.     Breath sounds: Normal breath sounds. No wheezing or rales.  Abdominal:     General: Bowel sounds are normal. There is no distension.     Palpations: Abdomen is soft.     Tenderness: There is no abdominal tenderness.  Musculoskeletal:     Cervical back: Normal range of motion and neck supple.  Lymphadenopathy:     Cervical: No cervical adenopathy.  Skin:    General: Skin is warm and dry.  Neurological:     Mental Status: She is alert and oriented to person, place, and time.     Deep Tendon Reflexes: Reflexes are normal and symmetric.  Psychiatric:        Behavior: Behavior normal.        Thought Content: Thought content normal.        Judgment: Judgment normal.       Assessment & Plan:   [name] was seen today for follow-up.  Diagnoses and all orders for this visit:  Essential hypertension -     CMP14+EGFR  Hypothyroidism, unspecified type -     CBC with Differential/Platelet -     CMP14+EGFR -     TSH  Iron deficiency anemia due to chronic blood loss -     CBC with Differential/Platelet  Age-related osteoporosis without current pathological fracture  Other orders -     hydrochlorothiazide (HYDRODIURIL) 25 MG tablet; Take 1/2 (one-half) tablet by mouth once daily -     denosumab (PROLIA) 60 MG/ML SOSY injection; Inject 60 mg into the skin once for 1 dose.   Allergies as of 10/04/2019      Reactions   Aspirin-caffeine    Upset stomach   Codeine    REACTION: upset stomach   Sulfonamide Derivatives    REACTION: swollen tongue      Medication List       Accurate as of October 04, 2019 11:05 AM. If you have any questions, ask your nurse or doctor.        STOP  taking these medications   risedronate 150 MG tablet Commonly known as: Actonel Stopped by: [name], MD     TAKE these medications   amoxicillin 500 MG tablet Commonly known as: AMOXIL Take 500 mg by mouth. Take four tablets 1 hour before dental procedures   Biotin 1000 MCG tablet Take 1,000 mcg by mouth daily.   denosumab 60 MG/ML Sosy injection Commonly known as: PROLIA Inject 60 mg into the skin once for 1 dose. Started by: [name], MD   hydrochlorothiazide 25 MG tablet Commonly known as: HYDRODIURIL  Take 1/2 (one-half) tablet by mouth once daily   lisinopril 40 MG tablet Commonly known as: ZESTRIL Take 1 tablet by mouth once daily   metoprolol tartrate 50 MG tablet Commonly known as: LOPRESSOR Take 1 tablet by mouth twice daily   Vitamin D3 250 MCG (10000 UT) capsule Take 10,000 Units by mouth daily.       Meds ordered this encounter  Medications  . hydrochlorothiazide (HYDRODIURIL) 25 MG tablet    Sig: Take 1/2 (one-half) tablet by mouth once daily    Dispense:  90 tablet    Refill:  1  . denosumab (PROLIA) 60 MG/ML SOSY injection    Sig: Inject 60 mg into the skin once for 1 dose.    Dispense:  1 mL    Refill:  0      Follow-up: Return in about 6 months (around 04/05/2020).  [name], M.D.

## 2019-10-04 NOTE — Telephone Encounter (Signed)
Prolia entered into [employer] for Benefit Verification.

## 2019-10-05 LAB — CMP14+EGFR
ALT: 8 IU/L (ref 0–32)
AST: 21 IU/L (ref 0–40)
Albumin/Globulin Ratio: 1.7 (ref 1.2–2.2)
Albumin: 4.5 g/dL (ref 3.6–4.6)
Alkaline Phosphatase: 63 IU/L (ref 48–121)
BUN/Creatinine Ratio: 16 (ref 12–28)
BUN: 20 mg/dL (ref 8–27)
Bilirubin Total: 0.6 mg/dL (ref 0.0–1.2)
CO2: 26 mmol/L (ref 20–29)
Calcium: 10 mg/dL (ref 8.7–10.3)
Chloride: 102 mmol/L (ref 96–106)
Creatinine, Ser: 1.28 mg/dL — ABNORMAL HIGH (ref 0.57–1.00)
GFR calc Af Amer: 43 mL/min/{1.73_m2} — ABNORMAL LOW (ref >59)
GFR calc non Af Amer: 37 mL/min/{1.73_m2} — ABNORMAL LOW (ref >59)
Globulin, Total: 2.6 g/dL (ref 1.5–4.5)
Glucose: 89 mg/dL (ref 65–99)
Potassium: 4.1 mmol/L (ref 3.5–5.2)
Sodium: 140 mmol/L (ref 134–144)
Total Protein: 7.1 g/dL (ref 6.0–8.5)

## 2019-10-05 LAB — CBC WITH DIFFERENTIAL/PLATELET
Basophils Absolute: 0 10*3/uL (ref 0.0–0.2)
Basos: 1 %
EOS (ABSOLUTE): 0.2 10*3/uL (ref 0.0–0.4)
Eos: 3 %
Hematocrit: 36.6 % (ref 34.0–46.6)
Hemoglobin: 12.2 g/dL (ref 11.1–15.9)
Immature Grans (Abs): 0 10*3/uL (ref 0.0–0.1)
Immature Granulocytes: 1 %
Lymphocytes Absolute: 1.8 10*3/uL (ref 0.7–3.1)
Lymphs: 32 %
MCH: 30.4 pg (ref 26.6–33.0)
MCHC: 33.3 g/dL (ref 31.5–35.7)
MCV: 91 fL (ref 79–97)
Monocytes Absolute: 0.5 10*3/uL (ref 0.1–0.9)
Monocytes: 9 %
Neutrophils Absolute: 3.1 10*3/uL (ref 1.4–7.0)
Neutrophils: 54 %
Platelets: 234 10*3/uL (ref 150–450)
RBC: 4.01 x10E6/uL (ref 3.77–5.28)
RDW: 13.4 % (ref 11.7–15.4)
WBC: 5.7 10*3/uL (ref 3.4–10.8)

## 2019-10-05 LAB — TSH: TSH: 3.72 u[IU]/mL (ref 0.450–4.500)

## 2019-10-05 NOTE — Progress Notes (Signed)
Hello Becky,  Your lab result is stable. The kidneys are somewhat weak, but that should not represent any medical problem for you.  Best regards, [name], M.D.

## 2019-10-24 ENCOUNTER — Ambulatory Visit: Payer: Self-pay | Admitting: *Deleted

## 2019-10-24 ENCOUNTER — Telehealth: Payer: Medicare Other

## 2019-10-24 ENCOUNTER — Telehealth: Payer: Self-pay | Admitting: *Deleted

## 2019-10-24 NOTE — Telephone Encounter (Signed)
  Chronic Care Management   Outreach Note  10/24/2019 Name: [name] MRN: [id number] DOB: 04/14/1928  Referred by: [name], MD Reason for referral : Chronic Care Management (Initial Visit)   An unsuccessful Initial Telephone Visit was attempted today. The patient was referred to the case management team for assistance with care management and care coordination.   Clinical Goals: . Over the next 10 days, patient will be contacted by a Care Guide to reschedule their Initial CCM Visit . Over the next 30 days, patient will have an Initial CCM Visit with a member of the embedded CCM team to discuss self-management of their chronic medical conditions  Interventions and Plan . Chart reviewed in preparation for initial visit telephone call . Collaboration with other care team members as needed . Unsuccessful outreach to patient  . Request sent to care guides to reach out and reschedule patient's initial visit   Chong Sicilian, BSN, RN-BC [facility] Direct Dial: [phone]

## 2019-11-02 NOTE — Telephone Encounter (Signed)
Pt has been r/s for 12/21/2019

## 2019-12-07 NOTE — Chronic Care Management (AMB) (Signed)
Encounter opened erroneously. See telephone encounter.

## 2019-12-21 ENCOUNTER — Telehealth: Payer: Self-pay | Admitting: *Deleted

## 2019-12-21 ENCOUNTER — Telehealth: Payer: Medicare Other | Admitting: *Deleted

## 2019-12-21 NOTE — Telephone Encounter (Signed)
  Chronic Care Management   Outreach Note  12/21/2019 Name: [name] MRN: [id number] DOB: [birth year]  Referred by: [name], MD Reason for referral : No chief complaint on file.   An unsuccessful Initial Telephone Visit was attempted today. The patient was referred to the case management team for assistance with care management and care coordination.   Clinical Goals: . Over the next 10 days, patient will be contacted by a Care Guide to reschedule their Initial CCM Visit . Over the next 30 days, patient will have an Initial CCM Visit with a member of the embedded CCM team to discuss self-management of their chronic medical conditions  Interventions and Plan . Chart reviewed in preparation for initial visit telephone call . Collaboration with other care team members as needed . 2nd Unsuccessful outreach to patient  . Request sent to care guides to reach out and reschedule patient's initial visit   Chong Sicilian, BSN, RN-BC [facility] Direct Dial: [phone]

## 2020-01-31 ENCOUNTER — Other Ambulatory Visit: Payer: Self-pay

## 2020-01-31 ENCOUNTER — Ambulatory Visit (INDEPENDENT_AMBULATORY_CARE_PROVIDER_SITE_OTHER): Payer: Medicare Other

## 2020-01-31 DIAGNOSIS — Z23 Encounter for immunization: Secondary | ICD-10-CM | POA: Diagnosis not present

## 2020-02-07 NOTE — Telephone Encounter (Signed)
Can you please try to r/s  

## 2020-02-08 NOTE — Telephone Encounter (Signed)
Called spoke with patient rescheduled for 12/23 3rd attempt gave patient contact information to reschedule if needed.   [facility] Management  Direct Dial: [phone]

## 2020-02-09 NOTE — Progress Notes (Signed)
Patient ID: [name], female   DOB: 05-02-1928, 86 y.o.   MRN: [id number]     86 y.o.  F/u MV disease. I took care of her husband [name] who passed from lung cancer 4 years ago after 26 years of marriage Last echo done11/4/20  with EF 60% mild AR MV repair normal with trace  MR and normal  LA   Still driving Has some varicose veins that look bad but don't hurt and no edema   Son [name] tragically died in a chain saw accident 2017/01/28. Two daughters one near [place] and one close by With another son living with her but [name] was the glue for the family  Has lost some weight due to depression   Had COVID and hospitalized for 6 days Doing well and had vaccine 90 days latter Oldest son [name] staying with her had some heart issues   Her primary has tried her on meds for osteoporosis but they tear her stomach up   Her son [name] who she had at age 31 prior to marrying [name] is living with her  He has always been difficult and unable to support himself  Daughter in Sports coach that Was married to [name] has "changed" and not very supportive   ROS: Denies fever, malais, weight loss, blurry vision, decreased visual acuity, cough, sputum, SOB, hemoptysis, pleuritic pain, palpitaitons, heartburn, abdominal pain, melena, lower extremity edema, claudication, or rash.  All other systems reviewed and negative  Affect appropriate Healthy:  appears stated age 33: normal Neck supple with no adenopathy JVP normal no bruits no thyromegaly Lungs clear with no wheezing and good diaphragmatic motion Heart:  S1/S2 no murmur, no rub, gallop or click PMI normal  Post sternotomy  Abdomen: benighn, BS positve, no tenderness, no AAA no bruit.  No HSM or HJR Distal pulses intact with no bruits No edema Neuro non-focal Skin warm and dry No muscular weakness    Current Outpatient Medications  Medication Sig Dispense Refill  . amoxicillin (AMOXIL) 500 MG tablet Take 500 mg by mouth. Take  four tablets 1 hour before dental procedures    . Biotin 1000 MCG tablet Take 1,000 mcg by mouth daily.    . Cholecalciferol (VITAMIN D3) 10000 UNITS capsule Take 10,000 Units by mouth daily.      . hydrochlorothiazide (HYDRODIURIL) 25 MG tablet Take 1/2 (one-half) tablet by mouth once daily 90 tablet 1  . lisinopril (ZESTRIL) 40 MG tablet Take 1 tablet by mouth once daily 90 tablet 2  . metoprolol tartrate (LOPRESSOR) 50 MG tablet Take 1 tablet by mouth twice daily 180 tablet 3   No current facility-administered medications for this visit.    Allergies  Actonel [risedronate sodium], Aspirin-caffeine, Codeine, and Sulfonamide derivatives  Electrocardiogram:   12/02/17  SR rate 62 LAD RBBB 02/13/2020 SR rate 58 RBBB/LAD   Assessment and Plan  MV Repair:  SBE prophylaxis discussed  Echo 01/12/19 with EF 60-65% with only trace MR and normal diastolic gradients normal function    HTN: Well controlled.  Continue current medications and low sodium Dash type diet.    Thyroid:  On replacement TSH 3.14 September 2019 normal   PAF: IN NSR continue current meds   Varicose Veins:  Stable continue support hose   Depression:  Husband [name] died of lung cancer 2015-06-29 Son [name] died January 28, 2017   [name]

## 2020-02-13 ENCOUNTER — Ambulatory Visit (INDEPENDENT_AMBULATORY_CARE_PROVIDER_SITE_OTHER): Payer: Medicare Other | Admitting: Cardiovascular Disease

## 2020-02-13 ENCOUNTER — Other Ambulatory Visit: Payer: Self-pay

## 2020-02-13 ENCOUNTER — Encounter: Payer: Self-pay | Admitting: Cardiovascular Disease

## 2020-02-13 VITALS — BP 150/84 | HR 58 | Ht 63.0 in | Wt 104.2 lb

## 2020-02-13 DIAGNOSIS — I480 Paroxysmal atrial fibrillation: Secondary | ICD-10-CM | POA: Diagnosis not present

## 2020-02-13 DIAGNOSIS — Z9889 Other specified postprocedural states: Secondary | ICD-10-CM | POA: Diagnosis not present

## 2020-02-13 NOTE — Patient Instructions (Signed)

## 2020-03-01 ENCOUNTER — Telehealth: Payer: Medicare Other

## 2020-03-08 DIAGNOSIS — Z23 Encounter for immunization: Secondary | ICD-10-CM | POA: Diagnosis not present

## 2020-03-21 ENCOUNTER — Ambulatory Visit: Payer: Medicare HMO | Admitting: *Deleted

## 2020-03-21 DIAGNOSIS — E039 Hypothyroidism, unspecified: Secondary | ICD-10-CM

## 2020-03-21 DIAGNOSIS — I10 Essential (primary) hypertension: Secondary | ICD-10-CM

## 2020-03-21 NOTE — Patient Instructions (Signed)
Plan:  . The patient has been provided with contact information for the care management team and has been advised to call if they would like to re-enroll in the CCM program to receive care management and care coordination services.  . CCM enrollment status changed to "previously enrolled" as per patient request on 03/21/2020  to discontinue enrollment. Case closed to case management services in primary care home.  . Patient was provided with general disease management education and encouraged to follow-up with their PCP and specialists as recommended.   Chong Sicilian, BSN, RN-BC Embedded Chronic Care Manager [facility] / [facility] Direct Dial: [phone]

## 2020-03-21 NOTE — Chronic Care Management (AMB) (Signed)
  Chronic Care Management   Initial Visit Note  03/21/2020 Name: [name] MRN: [id number] DOB: Apr 11, 1928  Referred by: [name], MD Reason for referral : Chronic Care Management (Initial Visit)   [name] is a 87 y.o. year old female who is a primary care patient of Stacks, Cletus Gash, MD. The CCM team was consulted for assistance with chronic disease management and care coordination needs related to Atrial Fibrillation, HTN and hypothyroidism  Review of patient status, including review of consultants reports, relevant laboratory and other test results was performed prior to outreach.   I spoke with [name] by telephone today regarding management of their chronic medical conditions. They do not have any CCM or resource needs and feel that their medical conditions are well managed. They appreciated the outreach, but do not feel that CCM services are needed at this time. They will reach out in the future if a need arises.   SDOH (Social Determinants of Health) assessments performed: Yes See Care Plan activities for detailed interventions related to SDOH     Plan:  . The patient has been provided with contact information for the care management team and has been advised to call if they would like to re-enroll in the CCM program to receive care management and care coordination services.  . CCM enrollment status changed to "previously enrolled" as per patient request on 03/21/2020  to discontinue enrollment. Case closed to case management services in primary care home.  . Patient was provided with general disease management education and encouraged to follow-up with their PCP and specialists as recommended.   Chong Sicilian, BSN, RN-BC Embedded Chronic Care Manager [facility] / [facility] Direct Dial: [phone]

## 2020-04-05 ENCOUNTER — Ambulatory Visit: Payer: Medicare Other | Admitting: Family Medicine

## 2020-04-05 DIAGNOSIS — I10 Essential (primary) hypertension: Secondary | ICD-10-CM | POA: Diagnosis not present

## 2020-04-05 DIAGNOSIS — Z20822 Contact with and (suspected) exposure to covid-19: Secondary | ICD-10-CM | POA: Diagnosis not present

## 2020-04-05 DIAGNOSIS — R2689 Other abnormalities of gait and mobility: Secondary | ICD-10-CM | POA: Diagnosis not present

## 2020-04-05 DIAGNOSIS — R109 Unspecified abdominal pain: Secondary | ICD-10-CM | POA: Diagnosis not present

## 2020-04-05 DIAGNOSIS — R55 Syncope and collapse: Secondary | ICD-10-CM | POA: Insufficient documentation

## 2020-04-05 DIAGNOSIS — N184 Chronic kidney disease, stage 4 (severe): Secondary | ICD-10-CM | POA: Diagnosis not present

## 2020-04-05 DIAGNOSIS — R059 Cough, unspecified: Secondary | ICD-10-CM | POA: Diagnosis not present

## 2020-04-05 DIAGNOSIS — I517 Cardiomegaly: Secondary | ICD-10-CM | POA: Diagnosis not present

## 2020-04-05 DIAGNOSIS — Z882 Allergy status to sulfonamides status: Secondary | ICD-10-CM | POA: Diagnosis not present

## 2020-04-05 DIAGNOSIS — I480 Paroxysmal atrial fibrillation: Secondary | ICD-10-CM | POA: Diagnosis not present

## 2020-04-05 DIAGNOSIS — R079 Chest pain, unspecified: Secondary | ICD-10-CM | POA: Diagnosis not present

## 2020-04-05 DIAGNOSIS — R531 Weakness: Secondary | ICD-10-CM | POA: Diagnosis not present

## 2020-04-05 DIAGNOSIS — Z885 Allergy status to narcotic agent status: Secondary | ICD-10-CM | POA: Diagnosis not present

## 2020-04-05 DIAGNOSIS — I429 Cardiomyopathy, unspecified: Secondary | ICD-10-CM | POA: Diagnosis not present

## 2020-04-05 DIAGNOSIS — K5730 Diverticulosis of large intestine without perforation or abscess without bleeding: Secondary | ICD-10-CM | POA: Diagnosis not present

## 2020-04-05 DIAGNOSIS — R112 Nausea with vomiting, unspecified: Secondary | ICD-10-CM | POA: Diagnosis not present

## 2020-04-05 DIAGNOSIS — K8020 Calculus of gallbladder without cholecystitis without obstruction: Secondary | ICD-10-CM | POA: Diagnosis not present

## 2020-04-05 DIAGNOSIS — I129 Hypertensive chronic kidney disease with stage 1 through stage 4 chronic kidney disease, or unspecified chronic kidney disease: Secondary | ICD-10-CM | POA: Diagnosis not present

## 2020-04-05 DIAGNOSIS — K6389 Other specified diseases of intestine: Secondary | ICD-10-CM | POA: Diagnosis not present

## 2020-04-05 DIAGNOSIS — Z049 Encounter for examination and observation for unspecified reason: Secondary | ICD-10-CM | POA: Diagnosis not present

## 2020-04-05 DIAGNOSIS — I214 Non-ST elevation (NSTEMI) myocardial infarction: Secondary | ICD-10-CM | POA: Diagnosis not present

## 2020-04-05 DIAGNOSIS — N281 Cyst of kidney, acquired: Secondary | ICD-10-CM | POA: Diagnosis not present

## 2020-04-06 DIAGNOSIS — I088 Other rheumatic multiple valve diseases: Secondary | ICD-10-CM | POA: Diagnosis not present

## 2020-04-06 DIAGNOSIS — I10 Essential (primary) hypertension: Secondary | ICD-10-CM | POA: Diagnosis not present

## 2020-04-06 DIAGNOSIS — R778 Other specified abnormalities of plasma proteins: Secondary | ICD-10-CM | POA: Diagnosis not present

## 2020-04-06 DIAGNOSIS — I4891 Unspecified atrial fibrillation: Secondary | ICD-10-CM | POA: Diagnosis not present

## 2020-04-06 DIAGNOSIS — I272 Pulmonary hypertension, unspecified: Secondary | ICD-10-CM | POA: Diagnosis not present

## 2020-04-06 DIAGNOSIS — E860 Dehydration: Secondary | ICD-10-CM | POA: Diagnosis not present

## 2020-04-06 DIAGNOSIS — R55 Syncope and collapse: Secondary | ICD-10-CM | POA: Diagnosis not present

## 2020-04-07 DIAGNOSIS — R55 Syncope and collapse: Secondary | ICD-10-CM | POA: Diagnosis not present

## 2020-04-08 DIAGNOSIS — I10 Essential (primary) hypertension: Secondary | ICD-10-CM | POA: Diagnosis not present

## 2020-04-08 DIAGNOSIS — E860 Dehydration: Secondary | ICD-10-CM | POA: Diagnosis not present

## 2020-04-08 DIAGNOSIS — R55 Syncope and collapse: Secondary | ICD-10-CM | POA: Diagnosis not present

## 2020-04-08 DIAGNOSIS — I214 Non-ST elevation (NSTEMI) myocardial infarction: Secondary | ICD-10-CM | POA: Diagnosis not present

## 2020-04-09 ENCOUNTER — Telehealth: Payer: Self-pay | Admitting: Cardiovascular Disease

## 2020-04-09 ENCOUNTER — Telehealth: Payer: Self-pay | Admitting: *Deleted

## 2020-04-09 DIAGNOSIS — I214 Non-ST elevation (NSTEMI) myocardial infarction: Secondary | ICD-10-CM | POA: Diagnosis not present

## 2020-04-09 DIAGNOSIS — I480 Paroxysmal atrial fibrillation: Secondary | ICD-10-CM | POA: Insufficient documentation

## 2020-04-09 DIAGNOSIS — I10 Essential (primary) hypertension: Secondary | ICD-10-CM | POA: Diagnosis not present

## 2020-04-09 DIAGNOSIS — R079 Chest pain, unspecified: Secondary | ICD-10-CM | POA: Diagnosis not present

## 2020-04-09 DIAGNOSIS — R55 Syncope and collapse: Secondary | ICD-10-CM | POA: Diagnosis not present

## 2020-04-09 DIAGNOSIS — I429 Cardiomyopathy, unspecified: Secondary | ICD-10-CM | POA: Insufficient documentation

## 2020-04-09 DIAGNOSIS — E860 Dehydration: Secondary | ICD-10-CM | POA: Diagnosis not present

## 2020-04-09 NOTE — Telephone Encounter (Signed)
Left message for patient to call back  

## 2020-04-09 NOTE — Telephone Encounter (Signed)
[name] is returning [name]'s call. Please advise.

## 2020-04-09 NOTE — Telephone Encounter (Signed)
I spoke with PA can have her see [place] in a week or two

## 2020-04-09 NOTE — Telephone Encounter (Signed)
Transition Care Management Unsuccessful Follow-up Telephone Call  Date of discharge and from where:  04/09/2020 [facility]   Attempts:  1st Attempt  Reason for unsuccessful TCM follow-up call:  No answer/busy

## 2020-04-09 NOTE — Progress Notes (Signed)
Patient ID: [name], female   DOB: 12/12/1928, 87 y.o.   MRN: [id number]     87 y.o.  F/u MV disease. I took care of her husband [name] who passed from lung cancer 4 years ago after 63 years of marriage Last echo done11/4/20  with EF 60% mild AR MV repair normal with trace  MR and normal  LA   Still driving Has some varicose veins that look bad but don't hurt and no edema   Son [name] tragically died in a chain saw accident 01-12-2017. Two daughters one near [place] and one close by With another son living with her but [name] was the glue for the family  Has lost some weight due to depression   Had COVID and hospitalized for 6 days Doing well and had vaccine 90 days latter Oldest son [name] staying with her had some heart issues   Her primary has tried her on meds for osteoporosis but they tear her stomach up   Her son [name] who she had at age 61 prior to marrying [name] is living with her  He has always been difficult and unable to support himself  Daughter in Sports coach that Was married to [name] has "changed" and not very supportive   Hospital[facility] at [facility] end of January with nausea and abdominal pain  Troponin elevated. Echo report  04/06/20 indicates EF 40-45% but did not mention MV repair called it MAC trace MR Noted apical hypokinesis  No chest pain or acute ECG changes Myovue done 04/09/20  and low risk no ischemia EF 53% . Patient did not want cath CT with ? Mild enteritis   Feels much better since d/c no chest pain Mild headache  Daughter [name] was with her today   ROS: Denies fever, malais, weight loss, blurry vision, decreased visual acuity, cough, sputum, SOB, hemoptysis, pleuritic pain, palpitaitons, heartburn, abdominal pain, melena, lower extremity edema, claudication, or rash.  All other systems reviewed and negative  Affect appropriate Healthy:  appears stated age 87: normal Neck supple with no adenopathy JVP normal no bruits no thyromegaly Lungs clear  with no wheezing and good diaphragmatic motion Heart:  S1/S2 no murmur, no rub, gallop or click PMI normal  Post sternotomy  Abdomen: benighn, BS positve, no tenderness, no AAA no bruit.  No HSM or HJR Distal pulses intact with no bruits No edema Neuro non-focal Skin warm and dry No muscular weakness    Current Outpatient Medications  Medication Sig Dispense Refill  . amoxicillin (AMOXIL) 500 MG tablet Take 500 mg by mouth. Take four tablets 1 hour before dental procedures    . aspirin 81 MG chewable tablet Chew 1 tablet by mouth daily.    Marland Kitchen atorvastatin (LIPITOR) 10 MG tablet Take 1 tablet by mouth daily.    . Biotin 1000 MCG tablet Take 1,000 mcg by mouth daily.    . Cholecalciferol (VITAMIN D3) 10000 UNITS capsule Take 10,000 Units by mouth daily.      Marland Kitchen losartan (COZAAR) 25 MG tablet Take 1 tablet by mouth daily.    . metoprolol succinate (TOPROL-XL) 25 MG 24 hr tablet Take 12.5 mg by mouth daily.     No current facility-administered medications for this visit.    Allergies  Actonel [risedronate sodium], Aspirin-caffeine, Codeine, and Sulfonamide derivatives  Electrocardiogram:   12/02/17  SR rate 62 LAD RBBB 04/18/2020 SR rate 58 RBBB/LAD    Assessment and Plan  MV Repair:  SBE prophylaxis discussed  Echo  01/12/19 with EF 60-65% with only trace MR and normal diastolic gradients normal function    HTN: Well controlled.  Continue current medications and low sodium Dash type diet.    Thyroid:  On replacement TSH 3.14 September 2019 normal   PAF: IN NSR continue current meds   Varicose Veins:  Stable continue support hose   Depression:  Husband [name] died of lung cancer 05/13/15 Son [name] died 01-09-17   Enteritis:  Improved etiology not clear   Elevated Troponin :  No further pain not clear she had cardiac event low risk myovue given age observe   [name]

## 2020-04-09 NOTE — Telephone Encounter (Signed)
Made patient an appointment next week with Dr. Johnsie Cancel.

## 2020-04-09 NOTE — Telephone Encounter (Signed)
Will forward to Dr.Nishan. 

## 2020-04-09 NOTE — Telephone Encounter (Signed)
Joe a PA at [facility] is calling requesting a callback from Dr. Johnsie Cancel to touch base and discuss the status of the patient. Please advise.

## 2020-04-10 NOTE — Telephone Encounter (Signed)
Transition Care Management Unsuccessful Follow-up Telephone Call  Date of discharge and from where:  04/09/20  Attempts:  2nd Attempt  Reason for unsuccessful TCM follow-up call:  Left voice message

## 2020-04-13 DIAGNOSIS — Z029 Encounter for administrative examinations, unspecified: Secondary | ICD-10-CM

## 2020-04-18 ENCOUNTER — Encounter: Payer: Self-pay | Admitting: Cardiovascular Disease

## 2020-04-18 ENCOUNTER — Ambulatory Visit: Payer: Medicare HMO | Admitting: Cardiovascular Disease

## 2020-04-18 ENCOUNTER — Other Ambulatory Visit: Payer: Self-pay

## 2020-04-18 ENCOUNTER — Encounter (INDEPENDENT_AMBULATORY_CARE_PROVIDER_SITE_OTHER): Payer: Self-pay

## 2020-04-18 VITALS — BP 154/74 | HR 76 | Ht 63.0 in | Wt 101.2 lb

## 2020-04-18 DIAGNOSIS — Z9889 Other specified postprocedural states: Secondary | ICD-10-CM | POA: Diagnosis not present

## 2020-04-18 DIAGNOSIS — I480 Paroxysmal atrial fibrillation: Secondary | ICD-10-CM

## 2020-04-18 DIAGNOSIS — R778 Other specified abnormalities of plasma proteins: Secondary | ICD-10-CM | POA: Diagnosis not present

## 2020-04-18 MED ORDER — METOPROLOL SUCCINATE ER 25 MG PO TB24: 12.5000 mg | ORAL_TABLET | Freq: Every day | ORAL | 3 refills | Status: DC

## 2020-04-18 MED ORDER — ATORVASTATIN CALCIUM 10 MG PO TABS: 10.0000 mg | ORAL_TABLET | Freq: Every day | ORAL | 3 refills | Status: DC

## 2020-04-18 MED ORDER — LOSARTAN POTASSIUM 25 MG PO TABS: 25.0000 mg | ORAL_TABLET | Freq: Every day | ORAL | 3 refills | Status: DC

## 2020-04-18 NOTE — Patient Instructions (Addendum)

## 2020-04-19 ENCOUNTER — Ambulatory Visit: Payer: Medicare HMO | Admitting: Family Medicine

## 2020-04-26 ENCOUNTER — Ambulatory Visit (INDEPENDENT_AMBULATORY_CARE_PROVIDER_SITE_OTHER): Payer: Medicare HMO | Admitting: Family Medicine

## 2020-04-26 ENCOUNTER — Encounter: Payer: Self-pay | Admitting: Family Medicine

## 2020-04-26 ENCOUNTER — Other Ambulatory Visit: Payer: Self-pay

## 2020-04-26 VITALS — BP 168/87 | HR 77 | Temp 97.8°F | Resp 20 | Ht 63.0 in | Wt 103.4 lb

## 2020-04-26 DIAGNOSIS — I509 Heart failure, unspecified: Secondary | ICD-10-CM

## 2020-04-26 DIAGNOSIS — E039 Hypothyroidism, unspecified: Secondary | ICD-10-CM

## 2020-04-26 DIAGNOSIS — I10 Essential (primary) hypertension: Secondary | ICD-10-CM

## 2020-04-26 DIAGNOSIS — D500 Iron deficiency anemia secondary to blood loss (chronic): Secondary | ICD-10-CM | POA: Diagnosis not present

## 2020-04-26 DIAGNOSIS — E782 Mixed hyperlipidemia: Secondary | ICD-10-CM

## 2020-04-26 DIAGNOSIS — D485 Neoplasm of uncertain behavior of skin: Secondary | ICD-10-CM

## 2020-04-26 MED ORDER — LOSARTAN POTASSIUM 50 MG PO TABS: 50.0000 mg | ORAL_TABLET | Freq: Every day | ORAL | 1 refills | Status: DC

## 2020-04-26 NOTE — Progress Notes (Addendum)
Subjective:  Patient ID: [name], female    DOB: 1928/03/27  Age: 87 y.o. MRN: [id number]  CC: Hospitalization Follow-up   HPI [name] presents for follow-up from her recent hospital stay.  There was some suggestion of a decrease in her ejection fraction at that time and some elevated troponins but her Myoview was low risk according to report from Dr. Johnsie Cancel on February 9. Patient presents for follow-up on  thyroid. The patient has a history of hypothyroidism for many years. It has been stable recently. Pt. denies any change in  voice, loss of hair, heat or cold intolerance. Energy level has been adequate to good. Patient denies constipation and diarrhea. No myxedema. Medication is as noted below. Verified that pt is taking it daily on an empty stomach. Well tolerated.  Follow-up of hypertension. Patient has no history of headache chest pain or shortness of breath or recent cough. Patient also denies symptoms of TIA such as numbness weakness lateralizing.Patient denies side effects from his medication. States taking it regularly.  Patient also suffers from chronic iron deficiency anemia due to GI blood loss from ischemic colitis history.  There is a mention of atrial fibrillation in her hospital record.  It actually is kind of vague.  But I note that [name] is almost 89 was placed on low-dose aspirin as well as metoprolol.  Hopefully we can get clarification on that soon.   Depression screen [facility] 2/9 04/26/2020 10/04/2019 04/05/2019  Decreased Interest 0 0 0  Down, Depressed, Hopeless 0 0 0  PHQ - 2 Score 0 0 0    History [name] has a past medical history of Arthritis, Atrial fibrillation ([place]), COVID-19, CVA (cerebral vascular accident) ([place]), HTN (hypertension), MR (mitral regurgitation), and Pericarditis.   [name] has a past surgical history that includes anterior repair with perigee graft (04/16/06); Anterior and posterior vaginal repair (10/15/04); sacral spinous ligament suspension of vagina;  suprapubic cystectomy; Total abdominal hysterectomy w/ bilateral salpingoophorectomy; and Mitral valve replacement.   Her family history includes Colon cancer in her brother; Heart attack in her father; Heart disease in her father and mother; Lupus in her father.[name] reports that [name] has never smoked. [name] has never used smokeless tobacco. [name] reports that [name] does not drink alcohol and does not use drugs.    ROS Review of Systems  Constitutional: Negative.   HENT: Negative.   Eyes: Negative for visual disturbance.  Respiratory: Negative for shortness of breath.   Cardiovascular: Negative for chest pain.  Gastrointestinal: Negative for abdominal pain.  Musculoskeletal: Negative for arthralgias.    Objective:  BP (!) 168/87   Pulse 77   Temp 97.8 F (36.6 C) (Temporal)   Resp 20   Ht $R'5\' 3"'Mi$  (1.6 m)   Wt 103 lb 6 oz (46.9 kg)   SpO2 95%   BMI 18.31 kg/m   BP Readings from Last 3 Encounters:  04/26/20 (!) 168/87  04/18/20 (!) 154/74  02/13/20 (!) 150/84    Wt Readings from Last 3 Encounters:  04/26/20 103 lb 6 oz (46.9 kg)  04/18/20 101 lb 3.2 oz (45.9 kg)  02/13/20 104 lb 3.2 oz (47.3 kg)     Physical Exam Constitutional:      General: [name] is not in acute distress.    Appearance: [name] is well-developed.  HENT:     Head: Normocephalic and atraumatic.  Eyes:     Conjunctiva/sclera: Conjunctivae normal.     Pupils: Pupils are equal, round, and reactive to  light.  Neck:     Thyroid: No thyromegaly.  Cardiovascular:     Rate and Rhythm: Normal rate and regular rhythm.     Heart sounds: Normal heart sounds. No murmur heard.   Pulmonary:     Effort: Pulmonary effort is normal. No respiratory distress.     Breath sounds: Normal breath sounds. No wheezing or rales.  Abdominal:     General: Bowel sounds are normal. There is no distension.     Palpations: Abdomen is soft.     Tenderness: There is no abdominal tenderness.  Musculoskeletal:        General: Normal range  of motion.     Cervical back: Normal range of motion and neck supple.  Lymphadenopathy:     Cervical: No cervical adenopathy.  Skin:    General: Skin is warm and dry.  Neurological:     Mental Status: [name] is alert and oriented to person, place, and time.  Psychiatric:        Behavior: Behavior normal.        Thought Content: Thought content normal.        Judgment: Judgment normal.       Assessment & Plan:   [name] was seen today for hospitalization follow-up.  Diagnoses and all orders for this visit:  Essential hypertension -     CBC with Differential/Platelet -     CMP14+EGFR -     AMB Referral to [facility]  Hypothyroidism, unspecified type -     CBC with Differential/Platelet -     CMP14+EGFR -     TSH + free T4 -     AMB Referral to [facility]  Iron deficiency anemia due to chronic blood loss -     CBC with Differential/Platelet -     CMP14+EGFR -     AMB Referral to [facility]  Mixed hyperlipidemia -     Lipid panel -     AMB Referral to [facility]  Neoplasm of uncertain behavior of skin -     Ambulatory referral to Dermatology -     AMB Referral to [facility]  Chronic congestive heart failure, unspecified heart failure type ([place]) -     AMB Referral to [facility]  Other orders -     losartan (COZAAR) 50 MG tablet; Take 1 tablet (50 mg total) by mouth daily.       I have changed [name] "[name]"'s losartan. I am also having her maintain her Vitamin D3, amoxicillin, Biotin, aspirin, atorvastatin, and metoprolol succinate.  Allergies as of 04/26/2020      Reactions   Actonel [risedronate Sodium] Nausea And Vomiting   Aspirin-caffeine    Upset stomach   Codeine    REACTION: upset stomach   Sulfonamide Derivatives    REACTION: swollen tongue      Medication List       Accurate as of April 26, 2020 11:59 PM. If you have any questions, ask your  nurse or doctor.        amoxicillin 500 MG tablet Commonly known as: AMOXIL Take 500 mg by mouth. Take four tablets 1 hour before dental procedures   aspirin 81 MG chewable tablet Chew 1 tablet by mouth daily.   atorvastatin 10 MG tablet Commonly known as: LIPITOR Take 1 tablet (10 mg total) by mouth daily.   Biotin 1000 MCG tablet Take 1,000 mcg by mouth daily.   losartan 50 MG  tablet Commonly known as: COZAAR Take 1 tablet (50 mg total) by mouth daily. What changed:   medication strength  how much to take Changed by: [name], MD   metoprolol succinate 25 MG 24 hr tablet Commonly known as: TOPROL-XL Take 0.5 tablets (12.5 mg total) by mouth daily.   Vitamin D3 250 MCG (10000 UT) capsule Take 10,000 Units by mouth daily.        Follow-up: Return in about 6 weeks (around 06/07/2020).  [name], M.D.

## 2020-04-27 LAB — CMP14+EGFR
ALT: 17 IU/L (ref 0–32)
AST: 26 IU/L (ref 0–40)
Albumin/Globulin Ratio: 1.9 (ref 1.2–2.2)
Albumin: 4.2 g/dL (ref 3.6–4.6)
Alkaline Phosphatase: 67 IU/L (ref 44–121)
BUN/Creatinine Ratio: 11 — ABNORMAL LOW (ref 12–28)
BUN: 11 mg/dL (ref 8–27)
Bilirubin Total: 0.7 mg/dL (ref 0.0–1.2)
CO2: 21 mmol/L (ref 20–29)
Calcium: 9.5 mg/dL (ref 8.7–10.3)
Chloride: 104 mmol/L (ref 96–106)
Creatinine, Ser: 1.04 mg/dL — ABNORMAL HIGH (ref 0.57–1.00)
GFR calc Af Amer: 55 mL/min/{1.73_m2} — ABNORMAL LOW (ref >59)
GFR calc non Af Amer: 48 mL/min/{1.73_m2} — ABNORMAL LOW (ref >59)
Globulin, Total: 2.2 g/dL (ref 1.5–4.5)
Glucose: 82 mg/dL (ref 65–99)
Potassium: 4.3 mmol/L (ref 3.5–5.2)
Sodium: 141 mmol/L (ref 134–144)
Total Protein: 6.4 g/dL (ref 6.0–8.5)

## 2020-04-27 LAB — CBC WITH DIFFERENTIAL/PLATELET
Basophils Absolute: 0 10*3/uL (ref 0.0–0.2)
Basos: 1 %
EOS (ABSOLUTE): 0.1 10*3/uL (ref 0.0–0.4)
Eos: 2 %
Hematocrit: 35.8 % (ref 34.0–46.6)
Hemoglobin: 12.2 g/dL (ref 11.1–15.9)
Immature Grans (Abs): 0 10*3/uL (ref 0.0–0.1)
Immature Granulocytes: 0 %
Lymphocytes Absolute: 1.4 10*3/uL (ref 0.7–3.1)
Lymphs: 25 %
MCH: 31 pg (ref 26.6–33.0)
MCHC: 34.1 g/dL (ref 31.5–35.7)
MCV: 91 fL (ref 79–97)
Monocytes Absolute: 0.4 10*3/uL (ref 0.1–0.9)
Monocytes: 7 %
Neutrophils Absolute: 3.6 10*3/uL (ref 1.4–7.0)
Neutrophils: 65 %
Platelets: 202 10*3/uL (ref 150–450)
RBC: 3.94 x10E6/uL (ref 3.77–5.28)
RDW: 13 % (ref 11.7–15.4)
WBC: 5.5 10*3/uL (ref 3.4–10.8)

## 2020-04-27 LAB — LIPID PANEL
Chol/HDL Ratio: 2.3 ratio (ref 0.0–4.4)
Cholesterol, Total: 153 mg/dL (ref 100–199)
HDL: 66 mg/dL (ref >39)
LDL Chol Calc (NIH): 73 mg/dL (ref 0–99)
Triglycerides: 69 mg/dL (ref 0–149)
VLDL Cholesterol Cal: 14 mg/dL (ref 5–40)

## 2020-04-27 LAB — TSH+FREE T4
Free T4: 1.03 ng/dL (ref 0.82–1.77)
TSH: 2.94 u[IU]/mL (ref 0.450–4.500)

## 2020-04-29 NOTE — Addendum Note (Signed)
Addended by: [name] on: 04/29/2020 06:42 PM   Modules accepted: Orders

## 2020-04-29 NOTE — Progress Notes (Signed)
Hello [place],  Your lab result is normal and/or stable.Some minor variations that are not significant are commonly marked abnormal, but do not represent any medical problem for you.  Best regards, Sabino Denning, M.D.

## 2020-04-30 ENCOUNTER — Telehealth: Payer: Self-pay | Admitting: *Deleted

## 2020-04-30 NOTE — Chronic Care Management (AMB) (Signed)
  Chronic Care Management   Note  04/30/2020 Name: [name] MRN: [id number] DOB: 16-Jul-1928  [name] is a 87 y.o. year old female who is a primary care patient of Stacks, Cletus Gash, MD. I reached out to [name] by phone today in response to a referral sent by [name]'s PCP,Stacks, Cletus Gash, MD      [name]'s daughter [name] was given information about Chronic Care Management services today including:  1. CCM service includes personalized support from designated clinical staff supervised by her physician, including individualized plan of care and coordination with other care providers 2. 24/7 contact phone numbers for assistance for urgent and routine care needs. 3. Service will only be billed when office clinical staff spend 20 minutes or more in a month to coordinate care. 4. Only one practitioner may furnish and bill the service in a calendar month. 5. The patient may stop CCM services at any time (effective at the end of the month) by phone call to the office staff. 6. The patient will be responsible for cost sharing (co-pay) of up to 20% of the service fee (after annual deductible is met).  Patient's daughter [name] agreed to services and verbal consent obtained,  Follow up plan: Telephone appointment with care management team member scheduled for:2/28/2  [facility]

## 2020-05-03 ENCOUNTER — Telehealth: Payer: Self-pay | Admitting: Cardiovascular Disease

## 2020-05-03 MED ORDER — LOSARTAN POTASSIUM 25 MG PO TABS: 25.0000 mg | ORAL_TABLET | Freq: Every day | ORAL | 3 refills | Status: DC

## 2020-05-03 MED ORDER — LOSARTAN POTASSIUM 50 MG PO TABS: 25.0000 mg | ORAL_TABLET | Freq: Every day | ORAL | 3 refills | Status: DC

## 2020-05-03 NOTE — Telephone Encounter (Signed)
Called patient back. Patient stated her PCP increased her dose of losartan to 50 mg from 25 mg. Patient stated her BP was high at the PCP's office, but she was stressed over her water being out at home. Patient stated she has tried taking losartan 50 mg and it makes her dizzy and her heart race. Patient stated she would like Dr. Johnsie Cancel opinion of this. Per Dr. Johnsie Cancel, it is okay for her to go back to Losartan 25 mg daily. Patient also stated she has stopped taking lipitor due to it makes her feel like she is not herself. Asked patient if after stopping lipitor did it make her feel any different, patient stated no. Encouraged patient to try taking lipitor every other day to see if that makes a difference. Patient stated she would try and would call us back with any other concerns.

## 2020-05-03 NOTE — Telephone Encounter (Signed)
Pt c/o medication issue:  1. Name of Medication: losartan (COZAAR) 50 MG tablet  2. How are you currently taking this medication (dosage and times per day)? 50 mg once daily  3. Are you having a reaction (difficulty breathing--STAT)?   4. What is your medication issue? Patient said her PCP Dr. Livia Snellen increased the dosage from 25 mg to 50 mg. Patient wanted to know if she should follow Dr. Livia Snellen advice and take the higher dose or if she should go back to the 25 mg dose that Dr. Johnsie Cancel wrote.   Please advise

## 2020-05-07 ENCOUNTER — Ambulatory Visit (INDEPENDENT_AMBULATORY_CARE_PROVIDER_SITE_OTHER): Payer: Medicare HMO | Admitting: *Deleted

## 2020-05-07 DIAGNOSIS — E782 Mixed hyperlipidemia: Secondary | ICD-10-CM | POA: Diagnosis not present

## 2020-05-07 DIAGNOSIS — I10 Essential (primary) hypertension: Secondary | ICD-10-CM | POA: Diagnosis not present

## 2020-05-07 NOTE — Patient Instructions (Signed)
Visit Information  PATIENT GOALS:  Goals Addressed            This Visit's Progress   . Track and Manage My Blood Pressure-Hypertension       Timeframe:  Long-Range Goal Priority:  Medium Start Date:   05/07/20                          Expected End Date: 11/04/20                      Follow Up Date 06/14/20  . Check and record blood pressure at least 3 times per week . Call PCP or cardiologist if blood pressure is running too high or too low . Take medications as prescribed . Call RN Care Manager [phone] as needed . Follow-up with PCP on 3/05/08/20 and Cardiologist on 07/30/20     Why is this important?    You won't feel high blood pressure, but it can still hurt your blood vessels.   High blood pressure can cause heart or kidney problems. It can also cause a stroke.   Making lifestyle changes like losing a little weight or eating less salt will help.   Checking your blood pressure at home and at different times of the day can help to control blood pressure.   If the doctor prescribes medicine remember to take it the way the doctor ordered.   Call the office if you cannot afford the medicine or if there are questions about it.     Notes:        Consent to CCM Services: [name] was given information about Chronic Care Management services today including:  1. CCM service includes personalized support from designated clinical staff supervised by her physician, including individualized plan of care and coordination with other care providers 2. 24/7 contact phone numbers for assistance for urgent and routine care needs. 3. Service will only be billed when office clinical staff spend 20 minutes or more in a month to coordinate care. 4. Only one practitioner may furnish and bill the service in a calendar month. 5. The patient may stop CCM services at any time (effective at the end of the month) by phone call to the office staff. 6. The patient will be responsible for cost  sharing (co-pay) of up to 20% of the service fee (after annual deductible is met).  Patient agreed to services and verbal consent obtained.   The patient verbalized understanding of instructions, educational materials, and care plan provided today and declined offer to receive copy of patient instructions, educational materials, and care plan.   . Telephone follow up appointment with care management team member scheduled for: 06/14/20 with RN Care Manager . The patient has been provided with contact information for the care management team and has been advised to call with any health related questions or concerns.  . Next PCP appointment scheduled for: 06/07/20 with Dr Livia Snellen . Next cardiology appointment scheduled for: 07/30/20 with [name]  Chong Sicilian, BSN, RN-BC [place] [facility] Care Management Direct Dial: [phone] *  CLINICAL CARE PLAN: Patient Care Plan: RNCM: Hypertension (Adult)    Problem Identified: Hypertension (Hypertension)   Priority: Medium    Long-Range Goal: Hypertension Monitored   This Visit's Progress: Not on track  Priority: Medium  Note:   Current Barriers:  . Chronic Disease Management support and education needs related to hypertension  in a patient with hyperlipidemia and hypothyroidism with recent MI . Unable to independently drive  Nurse Case Manager Clinical Goal(s):  . patient will work with Consulting civil engineer to address needs related to self-management of hypertension . patient will demonstrate improved adherence to prescribed treatment plan for hypertension as evidenced bychecking and recording blood pressure at least 3 times per week and calling PCP with any readings outside of recommended range  Interventions:  . 1:1 collaboration with [name], MD regarding development and update of comprehensive plan of care as evidenced by provider attestation and co-signature . Inter-disciplinary care  team collaboration (see longitudinal plan of care) . Evaluation of current treatment plan related to hypertension and patient's adherence to plan as established by provider. . Chart reviewed including relevant office notes and lab results . Discussed recent in-office blood pressure elevation . Discussed home blood pressure testing o Has monitor but doesn't check it regularly . Encouraged patient to check and record blood pressure at least 3 times per week and to call PCP or cardio with any readings outside of recommended range . Reviewed and discussed medications o Patient is compliant with medications and does not have any problems with affordability o Discussed recent changes in blood pressure medications since hospital discharge . Reviewed upcoming appointments with cardio and PCP . Provided with RN Care Manager contact number and encouraged to reach out as needed  Patient Goals/Self-Care Activities Over the next 60 days, patient will: . Check and record blood pressure at least 3 times per week . Call PCP or cardiologist if blood pressure is running too high or too low . Take medications as prescribed . Call RN Care Manager [phone] as needed . Follow-up with PCP on 3/05/08/20 and Cardiologist on 07/30/20   Follow Up Plan:  . Telephone follow up appointment with care management team member scheduled for: 06/14/20 with RN Care Manager . The patient has been provided with contact information for the care management team and has been advised to call with any health related questions or concerns.  . Next PCP appointment scheduled for: 06/07/20 with Dr Livia Snellen . Next cardiology appointment scheduled for: 07/30/20 with [name]

## 2020-05-07 NOTE — Chronic Care Management (AMB) (Signed)
Chronic Care Management   CCM RN Visit Note  05/07/2020 Name: [name] MRN: [id number] DOB: 28-Aug-1928  Subjective: [name] is a 87 y.o. year old female who is a primary care patient of Stacks, Cletus Gash, MD. The care management team was consulted for assistance with disease management and care coordination needs.    Engaged with patient by telephone for initial visit in response to provider referral for case management and/or care coordination services.   Consent to Services:  The patient was given the following information about Chronic Care Management services today, agreed to services, and gave verbal consent: 1. CCM service includes personalized support from designated clinical staff supervised by the primary care provider, including individualized plan of care and coordination with other care providers 2. 24/7 contact phone numbers for assistance for urgent and routine care needs. 3. Service will only be billed when office clinical staff spend 20 minutes or more in a month to coordinate care. 4. Only one practitioner may furnish and bill the service in a calendar month. 5.The patient may stop CCM services at any time (effective at the end of the month) by phone call to the office staff. 6. The patient will be responsible for cost sharing (co-pay) of up to 20% of the service fee (after annual deductible is met). Patient agreed to services and consent obtained.  Patient agreed to services and verbal consent obtained.   Assessment: Review of patient past medical history, allergies, medications, health status, including review of consultants reports, laboratory and other test data, was performed as part of comprehensive evaluation and provision of chronic care management services.   SDOH (Social Determinants of Health) assessments and interventions performed:    CCM Care Plan  Allergies  Allergen Reactions  . Actonel [Risedronate Sodium] Nausea And Vomiting  . Aspirin-Caffeine      Upset stomach   . Codeine     REACTION: upset stomach  . Sulfonamide Derivatives     REACTION: swollen tongue    Outpatient Encounter Medications as of 05/07/2020  Medication Sig  . amoxicillin (AMOXIL) 500 MG tablet Take 500 mg by mouth. Take four tablets 1 hour before dental procedures  . aspirin 81 MG chewable tablet Chew 1 tablet by mouth daily.  Marland Kitchen atorvastatin (LIPITOR) 10 MG tablet Take 1 tablet (10 mg total) by mouth daily.  . Biotin 1000 MCG tablet Take 1,000 mcg by mouth daily.  . Cholecalciferol (VITAMIN D3) 10000 UNITS capsule Take 10,000 Units by mouth daily.    Marland Kitchen losartan (COZAAR) 25 MG tablet Take 1 tablet (25 mg total) by mouth daily.  . metoprolol succinate (TOPROL-XL) 25 MG 24 hr tablet Take 0.5 tablets (12.5 mg total) by mouth daily.   No facility-administered encounter medications on file as of 05/07/2020.    Patient Active Problem List   Diagnosis Date Noted  . Iron deficiency anemia due to chronic blood loss 01/12/2017  . Hypothyroidism 01/06/2017  . RBBB 10/12/2013  . Varicose veins 10/12/2013  . Fatigue 11/06/2011  . Orthostatic hypotension 11/06/2011  . Mitral valve failure 10/09/2010  . PULMONARY HYPERTENSION, MILD 09/26/2009  . DIVERTICULOSIS, COLON 09/26/2009  . TRANSIENT ISCHEMIC ATTACKS, HX OF 09/26/2009  . COLONIC POLYPS, ADENOMATOUS, HX OF 09/26/2009  . ISCHEMIC COLITIS, HX OF 09/26/2009  . Essential hypertension 08/26/2008    Conditions to be addressed/monitored:HTN, HLD and hypothyroidism  Care Plan : RNCM: Hypertension (Adult)  Updates made by Ilean China, RN since 05/07/2020 12:00 AM    Problem: Hypertension (  Hypertension)   Priority: Medium    Long-Range Goal: Hypertension Monitored   This Visit's Progress: Not on track  Priority: Medium  Note:   Current Barriers:  . Chronic Disease Management support and education needs related to hypertension in a patient with hyperlipidemia and hypothyroidism with recent MI . Unable to  independently drive  Nurse Case Manager Clinical Goal(s):  . patient will work with Consulting civil engineer to address needs related to self-management of hypertension . patient will demonstrate improved adherence to prescribed treatment plan for hypertension as evidenced bychecking and recording blood pressure at least 3 times per week and calling PCP with any readings outside of recommended range  Interventions:  . 1:1 collaboration with [name], MD regarding development and update of comprehensive plan of care as evidenced by provider attestation and co-signature . Inter-disciplinary care team collaboration (see longitudinal plan of care) . Evaluation of current treatment plan related to hypertension and patient's adherence to plan as established by provider. . Chart reviewed including relevant office notes and lab results . Discussed recent in-office blood pressure elevation . Discussed home blood pressure testing o Has monitor but doesn't check it regularly . Encouraged patient to check and record blood pressure at least 3 times per week and to call PCP or cardio with any readings outside of recommended range . Reviewed and discussed medications o Patient is compliant with medications and does not have any problems with affordability o Discussed recent changes in blood pressure medications since hospital discharge . Reviewed upcoming appointments with cardio and PCP . Provided with RN Care Manager contact number and encouraged to reach out as needed  Patient Goals/Self-Care Activities Over the next 60 days, patient will: . Check and record blood pressure at least 3 times per week . Call PCP or cardiologist if blood pressure is running too high or too low . Take medications as prescribed . Call RN Care Manager [phone] as needed . Follow-up with PCP on 3/05/08/20 and Cardiologist on 07/30/20   Follow Up Plan:  . Telephone follow up appointment with care management team member  scheduled for: 06/14/20 with RN Care Manager . The patient has been provided with contact information for the care management team and has been advised to call with any health related questions or concerns.  . Next PCP appointment scheduled for: 06/07/20 with Dr Livia Snellen . Next cardiology appointment scheduled for: 07/30/20 with [name]  Chong Sicilian, BSN, RN-BC [place] [facility] Direct Dial: [phone]

## 2020-05-08 ENCOUNTER — Ambulatory Visit (INDEPENDENT_AMBULATORY_CARE_PROVIDER_SITE_OTHER): Payer: Medicare HMO | Admitting: *Deleted

## 2020-05-08 DIAGNOSIS — Z0000 Encounter for general adult medical examination without abnormal findings: Secondary | ICD-10-CM | POA: Diagnosis not present

## 2020-05-08 NOTE — Progress Notes (Signed)
MEDICARE ANNUAL WELLNESS VISIT  05/08/2020  Telephone Visit Disclaimer This Medicare AWV was conducted by telephone due to national recommendations for restrictions regarding the COVID-19 Pandemic (e.g. social distancing).  I verified, using two identifiers, that I am speaking with [name] or their authorized healthcare agent. I discussed the limitations, risks, security, and privacy concerns of performing an evaluation and management service by telephone and the potential availability of an in-person appointment in the future. The patient expressed understanding and agreed to proceed.  Location of Patient: Home Location of Provider (nurse):  [facility]  Subjective:    [name] is a 87 y.o. female patient of Stacks, Cletus Gash, MD who had a Medicare Annual Wellness Visit today via telephone. [name] is Retired and lives with her son. she has 3 children. she reports that she is socially active and does interact with friends/family regularly. she is not physically active and enjoys planting flowers, and dusting.  Patient Care Team: [name], MD as PCP - General (Family Medicine) Ilean China, RN as Case Manager Josue Hector, MD as Consulting Physician (Cardiology)  Advanced Directives 05/08/2020 08/24/2018 06/01/2017 02/23/2017 01/12/2017  Does Patient Have a Medical Advance Directive? Yes No No No Yes  Type of Paramedic of [place];Living will - - - Living will  Does patient want to make changes to medical advance directive? No - Patient declined - - No - Patient declined No - Patient declined  Copy of [place] in Chart? No - copy requested - - - -  Would patient like information on creating a medical advance directive? - Yes (MAU/Ambulatory/Procedural Areas - Information given) No - Patient declined - [facility] Utilization Over the Past 12 Months: # of hospitalizations or ER visits: 1 # of surgeries:  0  Review of Systems    Patient reports that her overall health is worse compared to last year. Due to current heart health issues.  History obtained from chart review and the patient  Patient Reported Readings (BP, Pulse, CBG, Weight, etc) none  Pain Assessment Pain : No/denies pain     Current Medications & Allergies (verified) Allergies as of 05/08/2020      Reactions   Actonel [risedronate Sodium] Nausea And Vomiting   Aspirin-caffeine    Upset stomach   Codeine    REACTION: upset stomach   Sulfonamide Derivatives    REACTION: swollen tongue      Medication List       Accurate as of May 08, 2020 11:16 AM. If you have any questions, ask your nurse or doctor.        amoxicillin 500 MG tablet Commonly known as: AMOXIL Take 500 mg by mouth. Take four tablets 1 hour before dental procedures   aspirin 81 MG chewable tablet Chew 1 tablet by mouth daily.   atorvastatin 10 MG tablet Commonly known as: LIPITOR Take 1 tablet (10 mg total) by mouth daily.   Biotin 1000 MCG tablet Take 1,000 mcg by mouth daily.   losartan 25 MG tablet Commonly known as: COZAAR Take 1 tablet (25 mg total) by mouth daily.   metoprolol succinate 25 MG 24 hr tablet Commonly known as: TOPROL-XL Take 0.5 tablets (12.5 mg total) by mouth daily.   Vitamin D3 250 MCG (10000 UT) capsule Take 10,000 Units by mouth daily.       History (reviewed): Past Medical History:  Diagnosis Date  . Arthritis   .  Atrial fibrillation ([place])    postoperative  . COVID-19   . CVA (cerebral vascular accident) ([place])   . HTN (hypertension)   . MR (mitral regurgitation)    severe  . NSTEMI (non-ST elevated myocardial infarction) ([place])   . Pericarditis    postoperative   Past Surgical History:  Procedure Laterality Date  . ANTERIOR AND POSTERIOR VAGINAL REPAIR  10/15/04   Dr. Nori Riis   . anterior repair with perigee graft  04/16/06   posterior repair with apogee graft; lynx mid urethral sling;  sacrospinous ligamnet suspension and cystoscopy; Dr. Joya Martyr  . MITRAL VALVE REPLACEMENT    . sacral spinous ligament suspension of vagina    . suprapubic cystectomy    . TOTAL ABDOMINAL HYSTERECTOMY W/ BILATERAL SALPINGOOPHORECTOMY     Family History  Problem Relation Age of Onset  . Heart disease Father   . Lupus Father   . Heart attack Father   . Heart disease Mother   . Colon cancer Brother   . Heart disease Brother    Social History   Socioeconomic History  . Marital status: Widowed    Spouse name: Not on file  . Number of children: 3  . Years of education: Not on file  . Highest education level: 9th grade  Occupational History  . Occupation: Retired  Tobacco Use  . Smoking status: Never Smoker  . Smokeless tobacco: Never Used  Vaping Use  . Vaping Use: Never used  Substance and Sexual Activity  . Alcohol use: No  . Drug use: No  . Sexual activity: Not on file  Other Topics Concern  . Not on file  Social History Narrative   Widowed, retired.    Social Determinants of Health   Financial Resource Strain: Low Risk   . Difficulty of Paying Living Expenses: Not hard at all  Food Insecurity: No Food Insecurity  . Worried About Charity fundraiser in the Last Year: Never true  . Ran Out of Food in the Last Year: Never true  Transportation Needs: No Transportation Needs  . Lack of Transportation (Medical): No  . Lack of Transportation (Non-Medical): No  Physical Activity: Not on file  Stress: Not on file  Social Connections: Moderately Integrated  . Frequency of Communication with Friends and Family: More than three times a week  . Frequency of Social Gatherings with Friends and Family: More than three times a week  . Attends Religious Services: More than 4 times per year  . Active Member of Clubs or Organizations: Yes  . Attends Archivist Meetings: More than 4 times per year  . Marital Status: Widowed    Activities of Daily Living In your present  state of health, do you have any difficulty performing the following activities: 05/08/2020 03/21/2020  Hearing? N N  Vision? N N  Difficulty concentrating or making decisions? N N  Walking or climbing stairs? N N  Dressing or bathing? N N  Doing errands, shopping? N N  Preparing Food and eating ? N N  Using the Toilet? N N  In the past six months, have you accidently leaked urine? N -  Do you have problems with loss of bowel control? N -  Managing your Medications? N N  Managing your Finances? N N  Housekeeping or managing your Housekeeping? N N  Some recent data might be hidden    Patient Education/ Literacy How often do you need to have someone help you when you read instructions, pamphlets,  or other written materials from your doctor or pharmacy?: 1 - Never What is the last grade level you completed in school?: 12th  Exercise Current Exercise Habits: The patient does not participate in regular exercise at present, Exercise limited by: cardiac condition(s)  Diet Patient reports consuming 2 meals a day and 1 snack(s) a day Patient reports that her primary diet is: Regular Patient reports that she does have regular access to food.   Depression Screen PHQ 2/9 Scores 05/08/2020 04/26/2020 10/04/2019 04/05/2019 08/24/2018 07/07/2017 06/18/2017  PHQ - 2 Score 0 0 0 0 0 0 0  Exception Documentation - - - - - - -  Not completed - - - - - - -     Fall Risk Fall Risk  05/08/2020 04/26/2020 10/04/2019 04/05/2019 08/24/2018  Falls in the past year? 0 0 0 1 0  Number falls in past yr: - - 0 1 0  Injury with Fall? - - 0 0 0  Comment - - - - -  Risk for fall due to : - - No Fall Risks History of fall(s) -  Follow up - Falls evaluation completed Falls evaluation completed Education provided -     Objective:  [name] seemed alert and oriented and she participated appropriately during our telephone visit.  Blood Pressure Weight BMI  BP Readings from Last 3 Encounters:  04/26/20 (!) 168/87   04/18/20 (!) 154/74  02/13/20 (!) 150/84   Wt Readings from Last 3 Encounters:  04/26/20 103 lb 6 oz (46.9 kg)  04/18/20 101 lb 3.2 oz (45.9 kg)  02/13/20 104 lb 3.2 oz (47.3 kg)   BMI Readings from Last 1 Encounters:  04/26/20 18.31 kg/m    *Unable to obtain current vital signs, weight, and BMI due to telephone visit type  Hearing/Vision  . [name] did not seem to have difficulty with hearing/understanding during the telephone conversation . Reports that she has not had a formal eye exam by an eye care professional within the past year . Reports that she has not had a formal hearing evaluation within the past year *Unable to fully assess hearing and vision during telephone visit type  Cognitive Function: 6CIT Screen 05/08/2020 08/24/2018  What Year? 0 points 0 points  What month? 0 points 0 points  What time? 0 points 0 points  Count back from 20 0 points 0 points  Months in reverse 0 points 0 points  Repeat phrase 0 points 0 points  Total Score 0 0   (Normal:0-7, Significant for Dysfunction: >8)  Normal Cognitive Function Screening: Yes   Immunization & Health Maintenance Record Immunization History  Administered Date(s) Administered  . Fluad Quad(high Dose 65+) 12/13/2018, 01/31/2020  . Influenza, High Dose Seasonal PF 01/22/2016, 01/06/2017, 02/09/2018  . Influenza,inj,Quad PF,6+ Mos 12/27/2012, 12/06/2013, 03/16/2015  . Influenza-Unspecified 01/02/2009, 01/18/2010, 02/14/2011  . Moderna Sars-Covid-2 Vaccination 05/11/2019, 06/08/2019  . Pneumococcal Conjugate-13 12/06/2013  . Pneumococcal Polysaccharide-23 04/05/2019  . Td 06/15/2017    Health Maintenance  Topic Date Due  . COVID-19 Vaccine (3 - Moderna risk 4-dose series) 07/06/2019  . TETANUS/TDAP  06/16/2027  . INFLUENZA VACCINE  Completed  . DEXA SCAN  Completed  . PNA vac Low Risk Adult  Completed  . HPV VACCINES  Aged Out       Assessment  This is a routine wellness examination for [name].  Health Maintenance: Due or Overdue Health Maintenance Due  Topic Date Due  . COVID-19 Vaccine (3 - Moderna  risk 4-dose series) 07/06/2019    [name] does not need a referral for Community Assistance: Currently enrolled with CCM nurse Chong Sicilian, RN Care Management:   no Social Work:    no Prescription Assistance:  no Nutrition/Diabetes Education:  no   Plan:  Personalized Goals Goals Addressed            This Visit's Progress   . AWV       05/08/2020 AWV Goal: Fall Prevention  . Over the next year, patient will decrease their risk for falls by: o Using assistive devices, such as a cane or walker, as needed o Identifying fall risks within their home and correcting them by: - Removing throw rugs - Adding handrails to stairs or ramps - Removing clutter and keeping a clear pathway throughout the home - Increasing light, especially at night - Adding shower handles/bars - Raising toilet seat o Identifying potential personal risk factors for falls: - Medication side effects - Incontinence/urgency - Vestibular dysfunction - Hearing loss - Musculoskeletal disorders - Neurological disorders - Orthostatic hypotension        Personalized Health Maintenance & Screening Recommendations  Patient is currently up to date  Lung Cancer Screening Recommended: no (Low Dose CT Chest recommended if Age 14-80 years, 30 pack-year currently smoking OR have quit w/in past 15 years) Hepatitis C Screening recommended: no HIV Screening recommended: no  Advanced Directives: Written information was not prepared per patient's request.  Referrals & Orders No orders of the defined types were placed in this encounter.   Follow-up Plan . Follow-up with [name], MD as planned . Schedule yearly eye exam . AVS printed and mailed to patient   I have personally reviewed and noted the following in the patient's chart:   . Medical and social history . Use of alcohol,  tobacco or illicit drugs  . Current medications and supplements . Functional ability and status . Nutritional status . Physical activity . Advanced directives . List of other physicians . Hospitalizations, surgeries, and ER visits in previous 12 months . Vitals . Screenings to include cognitive, depression, and falls . Referrals and appointments  In addition, I have reviewed and discussed with [name] certain preventive protocols, quality metrics, and best practice recommendations. A written personalized care plan for preventive services as well as general preventive health recommendations is available and can be mailed to the patient at her request.      Lynnea Ferrier, LPN  9/0/2409

## 2020-05-23 DIAGNOSIS — H40033 Anatomical narrow angle, bilateral: Secondary | ICD-10-CM | POA: Diagnosis not present

## 2020-05-23 DIAGNOSIS — H2513 Age-related nuclear cataract, bilateral: Secondary | ICD-10-CM | POA: Diagnosis not present

## 2020-06-07 ENCOUNTER — Ambulatory Visit: Payer: Medicare HMO | Admitting: Family Medicine

## 2020-06-12 ENCOUNTER — Ambulatory Visit (INDEPENDENT_AMBULATORY_CARE_PROVIDER_SITE_OTHER): Payer: Medicare HMO | Admitting: Family Medicine

## 2020-06-12 ENCOUNTER — Other Ambulatory Visit: Payer: Self-pay

## 2020-06-12 ENCOUNTER — Encounter: Payer: Self-pay | Admitting: Family Medicine

## 2020-06-12 VITALS — BP 152/76 | HR 69 | Temp 97.8°F | Ht 63.0 in | Wt 101.4 lb

## 2020-06-12 DIAGNOSIS — R42 Dizziness and giddiness: Secondary | ICD-10-CM

## 2020-06-12 DIAGNOSIS — I10 Essential (primary) hypertension: Secondary | ICD-10-CM | POA: Diagnosis not present

## 2020-06-12 DIAGNOSIS — G3184 Mild cognitive impairment, so stated: Secondary | ICD-10-CM

## 2020-06-12 DIAGNOSIS — R410 Disorientation, unspecified: Secondary | ICD-10-CM

## 2020-06-12 MED ORDER — DONEPEZIL HCL 5 MG PO TBDP: 5.0000 mg | ORAL_TABLET | Freq: Every day | ORAL | 1 refills | Status: DC

## 2020-06-12 NOTE — Progress Notes (Signed)
Subjective:  Patient ID: [name], female    DOB: [birth year]  Age: 87 y.o. MRN: [id number]  CC: Hypertension   HPI [name] presents for recheck of her blood pressure.  She says that her blood pressure is always of concern and that it is always run high.  She was hospitalized with a small heart attack at [facility] on the last day of January.  She had a SPECT MEP stress test.  At this time her ejection fraction was 53% and there was no ischemia.  Since that time she has become forgetful.  She repeats herself.  She seems confused at times.  Of note is that she started taking the losartan 50 mg one half daily plus a 25 mg pill daily that Dr. Johnsie Cancel, of cardiology, had prescribed.  There is some concern that this has been causing her confusion.  She also has some lightheaded dizziness.  She says she has to lean against something sometimes in the kitchen.  Family expresses concern for chronic strokes as well.  Depression screen [facility] 2/9 06/12/2020 05/08/2020 04/26/2020  Decreased Interest 0 0 0  Down, Depressed, Hopeless 0 0 0  PHQ - 2 Score 0 0 0    History [name] has a past medical history of Arthritis, Atrial fibrillation ([place]), COVID-19, CVA (cerebral vascular accident) ([place]), HTN (hypertension), MR (mitral regurgitation), NSTEMI (non-ST elevated myocardial infarction) ([place]), and Pericarditis.   She has a past surgical history that includes anterior repair with perigee graft (04/16/06); Anterior and posterior vaginal repair (10/15/04); sacral spinous ligament suspension of vagina; suprapubic cystectomy; Total abdominal hysterectomy w/ bilateral salpingoophorectomy; and Mitral valve replacement.   Her family history includes Colon cancer in her brother; Heart attack in her father; Heart disease in her brother, father, and mother; Lupus in her father.She reports that she has never smoked. She has never used smokeless tobacco. She reports that she does not drink alcohol and does not use  drugs.    ROS Review of Systems  Constitutional: Negative.   HENT: Negative.   Eyes: Negative for visual disturbance.  Respiratory: Negative for shortness of breath.   Cardiovascular: Negative for chest pain.  Gastrointestinal: Negative for abdominal pain.  Musculoskeletal: Negative for arthralgias.  Neurological: Positive for dizziness, light-headedness and headaches. Negative for speech difficulty.  Psychiatric/Behavioral: Positive for confusion and decreased concentration.    Objective:  BP (!) 152/76   Pulse 69   Temp 97.8 F (36.6 C)   Ht 5\' 3"  (1.6 m)   Wt 101 lb 6.4 oz (46 kg)   SpO2 99%   BMI 17.96 kg/m   BP Readings from Last 3 Encounters:  06/12/20 (!) 152/76  04/26/20 (!) 168/87  04/18/20 (!) 154/74    Wt Readings from Last 3 Encounters:  06/12/20 101 lb 6.4 oz (46 kg)  04/26/20 103 lb 6 oz (46.9 kg)  04/18/20 101 lb 3.2 oz (45.9 kg)     Physical Exam Constitutional:      General: She is not in acute distress.    Appearance: She is well-developed.  Neck:     Vascular: No carotid bruit.  Cardiovascular:     Rate and Rhythm: Normal rate and regular rhythm.  Pulmonary:     Breath sounds: Normal breath sounds.  Lymphadenopathy:     Cervical: No cervical adenopathy.  Skin:    General: Skin is warm and dry.  Neurological:     Mental Status: She is alert and oriented to person, place, and  time.       Assessment & Plan:   [name] was seen today for hypertension.  Diagnoses and all orders for this visit:  Confusion -     MR BRAIN W [place]; Future  Dizziness  Mild cognitive impairment  Essential hypertension  Other orders -     donepezil (ARICEPT ODT) 5 MG disintegrating tablet; Take 1 tablet (5 mg total) by mouth at bedtime.       I am having [name] "[name]" start on donepezil. I am also having her maintain her Vitamin D3, amoxicillin, Biotin, atorvastatin, metoprolol succinate, and losartan.  Allergies as of 06/12/2020       Reactions   Actonel [risedronate Sodium] Nausea And Vomiting   Aspirin-caffeine    Upset stomach   Codeine    REACTION: upset stomach   Sulfonamide Derivatives    REACTION: swollen tongue      Medication List       Accurate as of June 12, 2020  3:06 PM. If you have any questions, ask your nurse or doctor.        amoxicillin 500 MG tablet Commonly known as: AMOXIL Take 500 mg by mouth. Take four tablets 1 hour before dental procedures   atorvastatin 10 MG tablet Commonly known as: LIPITOR Take 1 tablet (10 mg total) by mouth daily.   Biotin 1000 MCG tablet Take 1,000 mcg by mouth daily.   donepezil 5 MG disintegrating tablet Commonly known as: ARICEPT ODT Take 1 tablet (5 mg total) by mouth at bedtime. Started by: [name], MD   losartan 25 MG tablet Commonly known as: COZAAR Take 1 tablet (25 mg total) by mouth daily.   metoprolol succinate 25 MG 24 hr tablet Commonly known as: TOPROL-XL Take 0.5 tablets (12.5 mg total) by mouth daily.   Vitamin D3 250 MCG (10000 UT) capsule Take 10,000 Units by mouth daily.        Follow-up: Return in about 1 month (around 07/12/2020).  [name], M.D.

## 2020-06-14 ENCOUNTER — Telehealth: Payer: Medicare HMO

## 2020-06-26 ENCOUNTER — Other Ambulatory Visit: Payer: Self-pay

## 2020-06-26 ENCOUNTER — Ambulatory Visit (HOSPITAL_COMMUNITY)
Admission: RE | Admit: 2020-06-26 | Discharge: 2020-06-26 | Disposition: A | Discharge status: Home / Self Care | Payer: Medicare HMO | Source: Ambulatory Visit | Attending: Family Medicine | Admitting: Family Medicine

## 2020-06-26 DIAGNOSIS — R410 Disorientation, unspecified: Secondary | ICD-10-CM | POA: Insufficient documentation

## 2020-06-26 DIAGNOSIS — R413 Other amnesia: Secondary | ICD-10-CM | POA: Diagnosis not present

## 2020-06-26 DIAGNOSIS — G319 Degenerative disease of nervous system, unspecified: Secondary | ICD-10-CM | POA: Diagnosis not present

## 2020-06-26 MED ORDER — GADOBUTROL 1 MMOL/ML IV SOLN
5.0000 mL | Freq: Once | INTRAVENOUS | Status: AC | PRN
  Administered 2020-06-26: 5 mL via INTRAVENOUS

## 2020-06-27 ENCOUNTER — Other Ambulatory Visit: Payer: Self-pay | Admitting: Family Medicine

## 2020-06-27 ENCOUNTER — Telehealth: Payer: Self-pay

## 2020-06-27 DIAGNOSIS — R42 Dizziness and giddiness: Secondary | ICD-10-CM

## 2020-06-27 DIAGNOSIS — I6381 Other cerebral infarction due to occlusion or stenosis of small artery: Secondary | ICD-10-CM

## 2020-06-27 NOTE — Telephone Encounter (Signed)
Diane called from Radiology Dept requesting that Dr Livia Snellen read result report from pts MRI done yesterday.

## 2020-07-05 ENCOUNTER — Ambulatory Visit (INDEPENDENT_AMBULATORY_CARE_PROVIDER_SITE_OTHER): Payer: Medicare HMO | Admitting: *Deleted

## 2020-07-05 DIAGNOSIS — I6381 Other cerebral infarction due to occlusion or stenosis of small artery: Secondary | ICD-10-CM

## 2020-07-05 DIAGNOSIS — I10 Essential (primary) hypertension: Secondary | ICD-10-CM | POA: Diagnosis not present

## 2020-07-05 DIAGNOSIS — G3184 Mild cognitive impairment, so stated: Secondary | ICD-10-CM

## 2020-07-05 NOTE — Chronic Care Management (AMB) (Signed)
Chronic Care Management   CCM RN Visit Note  07/05/2020 Name: [name] MRN: [id number] DOB: [birth year]  Subjective: [name] is a 87 y.o. year old female who is a primary care patient of Stacks, Cletus Gash, MD. The care management team was consulted for assistance with disease management and care coordination needs.    Engaged with patient by telephone for follow up visit in response to provider referral for case management and/or care coordination services.   Consent to Services:  The patient was given information about Chronic Care Management services, agreed to services, and gave verbal consent prior to initiation of services.  Please see initial visit note for detailed documentation.   Patient agreed to services and verbal consent obtained.   Assessment: Review of patient past medical history, allergies, medications, health status, including review of consultants reports, laboratory and other test data, was performed as part of comprehensive evaluation and provision of chronic care management services.   SDOH (Social Determinants of Health) assessments and interventions performed:    CCM Care Plan  Allergies  Allergen Reactions  . Actonel [Risedronate Sodium] Nausea And Vomiting  . Aspirin-Caffeine     Upset stomach   . Codeine     REACTION: upset stomach  . Sulfonamide Derivatives     REACTION: swollen tongue    Outpatient Encounter Medications as of 07/05/2020  Medication Sig  . amoxicillin (AMOXIL) 500 MG tablet Take 500 mg by mouth. Take four tablets 1 hour before dental procedures  . atorvastatin (LIPITOR) 10 MG tablet Take 1 tablet (10 mg total) by mouth daily.  . Biotin 1000 MCG tablet Take 1,000 mcg by mouth daily.  . Cholecalciferol (VITAMIN D3) 10000 UNITS capsule Take 10,000 Units by mouth daily.    Marland Kitchen donepezil (ARICEPT ODT) 5 MG disintegrating tablet Take 1 tablet (5 mg total) by mouth at bedtime.  Marland Kitchen losartan (COZAAR) 25 MG tablet Take 1 tablet (25 mg  total) by mouth daily.  . metoprolol succinate (TOPROL-XL) 25 MG 24 hr tablet Take 0.5 tablets (12.5 mg total) by mouth daily.   No facility-administered encounter medications on file as of 07/05/2020.    Patient Active Problem List   Diagnosis Date Noted  . Paroxysmal atrial fibrillation ([place]) 04/09/2020  . Cardiomyopathy ([place]) 04/09/2020  . Iron deficiency anemia due to chronic blood loss 01/12/2017  . Hypothyroidism 01/06/2017  . RBBB 10/12/2013  . Varicose veins 10/12/2013  . Fatigue 11/06/2011  . Orthostatic hypotension 11/06/2011  . Mitral valve failure 10/09/2010  . PULMONARY HYPERTENSION, MILD 09/26/2009  . DIVERTICULOSIS, COLON 09/26/2009  . TRANSIENT ISCHEMIC ATTACKS, HX OF 09/26/2009  . COLONIC POLYPS, ADENOMATOUS, HX OF 09/26/2009  . ISCHEMIC COLITIS, HX OF 09/26/2009  . Essential hypertension 08/26/2008    Conditions to be addressed/monitored:HTN, HLD, hypothyroidism, afib, hx of MI, cardiomyopathy, CHF, lacunar infarct   Care Plan : RNCM: Hypertension (Adult)  Updates made by Ilean China, RN since 07/05/2020 12:00 AM    Problem: Hypertension (Hypertension)   Priority: Medium    Long-Range Goal: Hypertension Monitored   This Visit's Progress: Not on track  Recent Progress: Not on track  Priority: Medium  Note:   Objective BP Readings from Last 3 Encounters:  06/12/20 (!) 152/76  04/26/20 (!) 168/87  04/18/20 (!) 154/74   Current Barriers:  . Chronic Disease Management support and education needs related to hypertension in a patient with hyperlipidemia and hypothyroidism with recent MI . Unable to independently drive  Nurse Case Manager Clinical Goal(s):  .  patient will work with Consulting civil engineer to address needs related to self-management of hypertension . patient will demonstrate improved adherence to prescribed treatment plan for hypertension as evidenced bychecking and recording blood pressure at least 3 times per week and calling PCP with any  readings outside of recommended range  Interventions:  . 1:1 collaboration with [name], MD regarding development and update of comprehensive plan of care as evidenced by provider attestation and co-signature . Inter-disciplinary care team collaboration (see longitudinal plan of care) . Evaluation of current treatment plan related to hypertension and patient's adherence to plan as established by provider. . Chart reviewed including relevant office notes and lab results . Discussed recent in-office blood pressure elevation . Encouraged patient to check and record blood pressure daily and to call PCP or cardio with any readings outside of recommended range . Reviewed and discussed medications o Patient is compliant with medications and does not have any problems with affordability . Reviewed and discussed upcoming appointments . Reinforced low solidum, DASH diet . Provided with RN Care Manager contact number and encouraged to reach out as needed  Patient Goals/Self-Care Activities Over the next 60 days, patient will: . Check and record blood pressure daily . Call PCP or cardiologist if blood pressure is running too high or too low . Take medications as prescribed . Call RN Care Manager [phone] as needed . Follow a low sodium diet and don't add extra salt to food    Care Plan : RNCM:Stroke (Adult)  Updates made by Ilean China, RN since 07/05/2020 12:00 AM    Problem: Harm or Injury (Stroke)   Priority: High    Goal: Harm or Injury Prevented   Start Date: 07/05/2020  This Visit's Progress: On track  Priority: High  Note:   Current Barriers:  . Chronic Disease Management support and education needs related to lacunar infarct . Unable to independently drive  Nurse Case Manager Clinical Goal(s):  . patient will work with neurologist to address needs related to medical management of lacunar infarct and prevention of future strokes . patient will meet with RN Care  Manager to address self-management of residual effects and stroke prevention  Interventions:  . 1:1 collaboration with [name], MD regarding development and update of comprehensive plan of care as evidenced by provider attestation and co-signature . Inter-disciplinary care team collaboration (see longitudinal plan of care) . Evaluation of current treatment plan related to lacunar infarct and patient's adherence to plan as established by provider. . Chart reviewed including relevant office notes, lab results, imaging reports, and referral notes . Reviewed and discussed medications with patient . Discussed family/social support o Has great support from daughters and they help with transportation to her appointments . Fall screening performed . Discussed safety and fall prevention . Discussed driving. Patient enjoys being independent but recognizes that is not safe for her to drive now. . Therapeutic listening utilized . Provided verbal education of fall prevention strategies and assessed current level of knowledge . Reviewed and discussed upcoming appointments . Verbal education provided on when to seek emergency medical attention . Discussed plans with patient for ongoing care management follow up and provided patient with direct contact information for care management team . Provided with RNCM contact number and encouraged to reach out as needed  Self Care Activities:  . Self administers medications as prescribed . Attends all scheduled provider appointments . Performs ADL's independently . Calls provider office for new concerns or questions  Patient Goals Over  the next 30 days, patient will: . Take medication as prescribed . Keep appointment with neurologist . Seek appropriate medical attention for any new or worsening symptoms . Avoid driving and ask family/friends for transportation  . Move carefully to avoid falls and use assistive devices as needed . Call RN Care Manager  as needed     Follow Up Plan:  . Telephone follow up appointment with care management team member scheduled for: 07/17/20 with RNCM . The patient has been provided with contact information for the care management team and has been advised to call with any health related questions or concerns.  . Next PCP appointment scheduled for:  07/12/20 with Dr Livia Snellen . Neurology appointment scheduled for: 07/09/20  Chong Sicilian, BSN, RN-BC [place] [facility] [place] [facility] Direct Dial: [phone]

## 2020-07-05 NOTE — Patient Instructions (Signed)
Visit Information  PATIENT GOALS: Goals Addressed            This Visit's Progress   . Harm or Injury Prevented       Timeframe:  Long-Range Goal Priority:  High Start Date:  07/05/20                           Expected End Date:                        Follow-up: 07/17/20  . Take medication as prescribed . Keep appointment with neurologist . Seek appropriate medical attention for any new or worsening symptoms . Avoid driving and ask family/friends for transportation  . Move carefully to avoid falls and use assistive devices as needed . Call RN Care Manager as needed    . Track and Manage My Blood Pressure-Hypertension   Not on track    Timeframe:  Long-Range Goal Priority:  Medium Start Date:   05/07/20                          Expected End Date: 11/04/20                      Follow Up Date 07/17/20   . Check and record blood pressure daily . Call PCP or cardiologist if blood pressure is running too high or too low . Take medications as prescribed . Call RN Care Manager [phone] as needed . Follow a low sodium diet and don't add extra salt to food   Why is this important?    You won't feel high blood pressure, but it can still hurt your blood vessels.   High blood pressure can cause heart or kidney problems. It can also cause a stroke.   Making lifestyle changes like losing a little weight or eating less salt will help.   Checking your blood pressure at home and at different times of the day can help to control blood pressure.   If the doctor prescribes medicine remember to take it the way the doctor ordered.   Call the office if you cannot afford the medicine or if there are questions about it.     Notes:        The patient verbalized understanding of instructions, educational materials, and care plan provided today and declined offer to receive copy of patient instructions, educational materials, and care plan.   Follow Up Plan:  . Telephone follow up  appointment with care management team member scheduled for: 07/17/20 with RNCM . The patient has been provided with contact information for the care management team and has been advised to call with any health related questions or concerns.  . Next PCP appointment scheduled for:  07/12/20 with Dr Livia Snellen . Neurology appointment scheduled for: 07/09/20  Chong Sicilian, BSN, RN-BC [place] [facility] Direct Dial: [phone]

## 2020-07-09 ENCOUNTER — Encounter: Payer: Self-pay | Admitting: Neurology

## 2020-07-09 ENCOUNTER — Ambulatory Visit: Payer: Medicare HMO | Admitting: Neurology

## 2020-07-09 VITALS — BP 180/84 | HR 84 | Ht 63.0 in | Wt 99.4 lb

## 2020-07-09 DIAGNOSIS — Z113 Encounter for screening for infections with a predominantly sexual mode of transmission: Secondary | ICD-10-CM | POA: Diagnosis not present

## 2020-07-09 DIAGNOSIS — L570 Actinic keratosis: Secondary | ICD-10-CM | POA: Diagnosis not present

## 2020-07-09 DIAGNOSIS — D485 Neoplasm of uncertain behavior of skin: Secondary | ICD-10-CM | POA: Diagnosis not present

## 2020-07-09 DIAGNOSIS — R413 Other amnesia: Secondary | ICD-10-CM

## 2020-07-09 DIAGNOSIS — R930 Abnormal findings on diagnostic imaging of skull and head, not elsewhere classified: Secondary | ICD-10-CM | POA: Diagnosis not present

## 2020-07-09 DIAGNOSIS — F0390 Unspecified dementia without behavioral disturbance: Secondary | ICD-10-CM | POA: Diagnosis not present

## 2020-07-09 DIAGNOSIS — L853 Xerosis cutis: Secondary | ICD-10-CM | POA: Diagnosis not present

## 2020-07-09 DIAGNOSIS — L905 Scar conditions and fibrosis of skin: Secondary | ICD-10-CM | POA: Diagnosis not present

## 2020-07-09 DIAGNOSIS — L821 Other seborrheic keratosis: Secondary | ICD-10-CM | POA: Diagnosis not present

## 2020-07-09 DIAGNOSIS — C4441 Basal cell carcinoma of skin of scalp and neck: Secondary | ICD-10-CM | POA: Diagnosis not present

## 2020-07-09 DIAGNOSIS — Z85828 Personal history of other malignant neoplasm of skin: Secondary | ICD-10-CM | POA: Diagnosis not present

## 2020-07-09 MED ORDER — MEMANTINE HCL 28 X 5 MG & 21 X 10 MG PO TABS: ORAL_TABLET | ORAL | 12 refills | Status: DC

## 2020-07-09 MED ORDER — MEMANTINE HCL 10 MG PO TABS: 10.0000 mg | ORAL_TABLET | Freq: Two times a day (BID) | ORAL | 3 refills | Status: DC

## 2020-07-09 NOTE — Progress Notes (Signed)
[facility] [address] [place]. [place] [address] [phone]       OFFICE CONSULT NOTE  [name] Date of Birth:  [birth year] Medical Record Number:  [id number]   Referring MD: Terrance Mass  Reason for Referral: Dizziness and confusion HPI: [name] is a 87 year old pleasant Caucasian lady seen today for initial office consultation visit.  History is obtained from the patient and daughter [name] was accompanying her as well as review of referral notes and electronic medical records.  I personally reviewed imaging films in PACS.  She has past medical history of hypertension, coronary artery disease, pericarditis, atrial fibrillation and arthritis.  Patient was admitted for non-ST TEE elevated myocardial infarction at [facility] on 04/05/2020 for 5 days.  At that time cardiac work-up showed ejection fraction of 45 to 50% with apical akinesia.  Since then patient has noticed some memory difficulties as well as some confusion and dizziness.  The dizziness was attributed to blood pressure medications that had been introduced and seem to have improved after recent medication change.  She is still having memory difficulties which not improved.  The primary care physician added Aricept on 06/14/2020 which she is tolerating well without any GI or cognitive side effects but has not noticed any benefit yet.  She is currently living with her son.  She is independent in most actives of daily living.  She is not driving.  She can cook and clean.  She needs some help with bills.  She has no trouble sleeping.  There have been no delusions, hallucinations, agitation or unsafe behaviors noted.  She does have chronic underlying anxiety which appears unchanged.  There is no prior history of seizures, strokes, TIAs, significant head injury with loss of consciousness.  There is no family history of Alzheimer's.  She had an outpatient MRI scan of the brain on 06/26/2020 which showed motion  degradation and tiny questionable punctate focus of restricted diffusion in the medial left temporal lobe/hippocampus seen only on the axial DWI images but not on the corresponding coronal images and did not have any ADC correlate and is a finding of unclear significance.  Patient denies any clinical episodes suggestive of seizure or transient global amnesia which could be explained by this.  Notably the postcontrast images were quite suboptimal and patient likely needs a follow-up MRI scan to see if this was an artifact.  ROS:   14 system review of systems is positive for confusion, disorientation, dizziness, anxious and all other systems negative PMH:  Past Medical History:  Diagnosis Date  . Arthritis   . Atrial fibrillation ([place])    postoperative  . COVID-19   . CVA (cerebral vascular accident) ([place])   . HTN (hypertension)   . MR (mitral regurgitation)    severe  . NSTEMI (non-ST elevated myocardial infarction) ([place])   . Pericarditis    postoperative    Social History:  Social History   Socioeconomic History  . Marital status: Widowed    Spouse name: Not on file  . Number of children: 3  . Years of education: Not on file  . Highest education level: 9th grade  Occupational History  . Occupation: Retired  Tobacco Use  . Smoking status: Never Smoker  . Smokeless tobacco: Never Used  Vaping Use  . Vaping Use: Never used  Substance and Sexual Activity  . Alcohol use: No  . Drug use: No  . Sexual activity: Not on file  Other Topics Concern  .  Not on file  Social History Narrative   Lives with son   Right Handed    Drinks 2-3  cups caffeine daily   Social Determinants of Health   Financial Resource Strain: Low Risk   . Difficulty of Paying Living Expenses: Not hard at all  Food Insecurity: No Food Insecurity  . Worried About Charity fundraiser in the Last Year: Never true  . Ran Out of Food in the Last Year: Never true  Transportation Needs: No Transportation Needs   . Lack of Transportation (Medical): No  . Lack of Transportation (Non-Medical): No  Physical Activity: Not on file  Stress: Not on file  Social Connections: Moderately Integrated  . Frequency of Communication with Friends and Family: More than three times a week  . Frequency of Social Gatherings with Friends and Family: More than three times a week  . Attends Religious Services: More than 4 times per year  . Active Member of Clubs or Organizations: Yes  . Attends Archivist Meetings: More than 4 times per year  . Marital Status: Widowed  Intimate Partner Violence: Not on file    Medications:   Current Outpatient Medications on File Prior to Visit  Medication Sig Dispense Refill  . amoxicillin (AMOXIL) 500 MG tablet Take 500 mg by mouth. Take four tablets 1 hour before dental procedures    . atorvastatin (LIPITOR) 10 MG tablet Take 1 tablet (10 mg total) by mouth daily. 90 tablet 3  . Biotin 1000 MCG tablet Take 1,000 mcg by mouth daily.    . Cholecalciferol (VITAMIN D3) 10000 UNITS capsule Take 10,000 Units by mouth daily.      Marland Kitchen donepezil (ARICEPT ODT) 5 MG disintegrating tablet Take 1 tablet (5 mg total) by mouth at bedtime. 30 tablet 1  . losartan (COZAAR) 25 MG tablet Take 1 tablet (25 mg total) by mouth daily. 90 tablet 3  . metoprolol succinate (TOPROL-XL) 25 MG 24 hr tablet Take 0.5 tablets (12.5 mg total) by mouth daily. 45 tablet 3   No current facility-administered medications on file prior to visit.    Allergies:   Allergies  Allergen Reactions  . Actonel [Risedronate Sodium] Nausea And Vomiting  . Aspirin-Caffeine     Upset stomach   . Codeine     REACTION: upset stomach  . Sulfonamide Derivatives     REACTION: swollen tongue    Physical Exam General: Frail elderly Caucasian lady not in distress.  Seated, in no evident distress.  She appears anxious. Head: head normocephalic and atraumatic.   Neck: supple with no carotid or supraclavicular  bruits Cardiovascular: regular rate and rhythm, no murmurs Musculoskeletal: no deformity Skin:  no rash/petichiae Vascular:  Normal pulses all extremities  Neurologic Exam Mental Status: Awake and fully alert. Oriented to place and time. Recent and remote memory diminished. Attention span, concentration and fund of knowledge diminished. Mood and affect appropriate.  Recall 2/3.  Able to name 11 animals that can walk on 4 legs.  Clock drawing 3/4.  Difficulty with copying intersecting pentagons. Cranial Nerves: Fundoscopic exam reveals sharp disc margins. Pupils equal, briskly reactive to light. Extraocular movements full without nystagmus. Visual fields full to confrontation. Hearing slightly diminished bilaterally.. Facial sensation intact. Face, tongue, palate moves normally and symmetrically.  Motor: Normal bulk and tone. Normal strength in all tested extremity muscles. Sensory.: intact to touch , pinprick , position and vibratory sensation.  Coordination: Rapid alternating movements normal in all extremities. Finger-to-nose and heel-to-shin performed  accurately bilaterally. Gait and Station: Arises from chair without difficulty. Stance is normal. Gait demonstrates normal stride length and balance .  Not able to heel, toe and tandem walk .  Reflexes: 1+ and symmetric. Toes downgoing.      ASSESSMENT: 87 year old Caucasian lady with subacute memory loss and confusion following myocardial infarction in January 2022 likely due to senile dementia.  Abnormal MRI scan of the brain showing tiny left hippocampal diffusion hyperintensity on DWI of questionable significance.     PLAN: I had a long discussion with the patient and her daughter regarding her symptoms of subacute memory loss and confusion likely related to senile dementia.  We also discussed results of MRI scan of the brain showing questionable abnormality in the left medial temporal lobe.  I recommend further evaluation by repeating MRI  scan of the brain with and without contrast in about a month and also checking lab work for reversible causes of memory loss, EEG.  Trial of Namenda starter pack and if tolerated after a month increase to 10 mg twice daily to help with her memory loss.  Continue Aricept and the current dose which she is tolerating well.  She was also advised to increase participation in cognitively challenging activities like solving crossword puzzles, playing bridge and sodoku.  We also discussed memory compensation strategies.  She will continue on aspirin for stroke prevention but given history of atrial fibrillation and abnormal MRI showing possible lacunar strokes I recommend she discuss with her cardiologist Dr. Johnsie Cancel about going back to Xarelto.  She will return for follow-up in 2 months or call earlier if necessary.  Greater than 50% time during this 45-minute consultation visit was spent in counseling and coordination of care about her memory loss and confusion and answering questions. Sharon Contras, MD Note: This document was prepared with digital dictation and possible smart phrase technology. Any transcriptional errors that result from this process are unintentional.

## 2020-07-09 NOTE — Patient Instructions (Signed)
I had a long discussion with the patient and her daughter regarding her symptoms of subacute memory loss and confusion likely related to senile dementia.  We also discussed results of MRI scan of the brain showing questionable abnormality in the left medial temporal lobe.  I recommend further evaluation by repeating MRI scan of the brain with and without contrast in about a month and also checking lab work for reversible causes of memory loss, EEG.  Trial of Namenda starter pack and if tolerated after a month increase to 10 mg twice daily to help with her memory loss.  Continue Aricept and the current dose which she is tolerating well.  She was also advised to increase participation in cognitively challenging activities like solving crossword puzzles, playing bridge and sodoku.  We also discussed memory compensation strategies.  She will continue on aspirin for stroke prevention but given history of atrial fibrillation and abnormal MRI showing possible lacunar strokes I recommend she discuss with her cardiologist Dr. Johnsie Cancel about going back to Xarelto.  She will return for follow-up in 2 months or call earlier if necessary. Memory Compensation Strategies  1. Use "WARM" strategy.  W= write it down  A= associate it  R= repeat it  M= make a mental note  2.   You can keep a Social worker.  Use a 3-ring notebook with sections for the following: calendar, important names and phone numbers,  medications, doctors' names/phone numbers, lists/reminders, and a section to journal what you did  each day.   3.    Use a calendar to write appointments down.  4.    Write yourself a schedule for the day.  This can be placed on the calendar or in a separate section of the Memory Notebook.  Keeping a  regular schedule can help memory.  5.    Use medication organizer with sections for each day or morning/evening pills.  You may need help loading it  6.    Keep a basket, or pegboard by the door.  Place items that  you need to take out with you in the basket or on the pegboard.  You may also want to  include a message board for reminders.  7.    Use sticky notes.  Place sticky notes with reminders in a place where the task is performed.  For example: " turn off the  stove" placed by the stove, "lock the door" placed on the door at eye level, " take your medications" on  the bathroom mirror or by the place where you normally take your medications.  8.    Use alarms/timers.  Use while cooking to remind yourself to check on food or as a reminder to take your medicine, or as a  reminder to make a call, or as a reminder to perform another task, etc.

## 2020-07-10 LAB — DEMENTIA PANEL
Homocysteine: 26.6 umol/L — ABNORMAL HIGH (ref 0.0–21.3)
RPR Ser Ql: NONREACTIVE
TSH: 4.21 u[IU]/mL (ref 0.450–4.500)
Vitamin B-12: 234 pg/mL (ref 232–1245)

## 2020-07-11 ENCOUNTER — Telehealth: Payer: Self-pay | Admitting: Neurology

## 2020-07-11 NOTE — Telephone Encounter (Signed)
LVM for pt daughter to call me back if they want to go to [facility]

## 2020-07-12 ENCOUNTER — Ambulatory Visit: Payer: Medicare HMO | Admitting: Family Medicine

## 2020-07-13 ENCOUNTER — Other Ambulatory Visit: Payer: Self-pay | Admitting: Neurology

## 2020-07-13 MED ORDER — FOLIC ACID 1 MG PO TABS: 1.0000 mg | ORAL_TABLET | Freq: Every day | ORAL | 3 refills | Status: DC

## 2020-07-13 NOTE — Progress Notes (Signed)
Kindly inform the patient that lab work for reversible causes of memory loss was mostly normal except for elevated homocystine which is protein in the blood.  She needs to start taking folic acid 1 mg daily and repeat this test in 2 months.  In case she is smoking she needs to quit that.

## 2020-07-17 ENCOUNTER — Ambulatory Visit (INDEPENDENT_AMBULATORY_CARE_PROVIDER_SITE_OTHER): Payer: Medicare HMO | Admitting: *Deleted

## 2020-07-17 DIAGNOSIS — I10 Essential (primary) hypertension: Secondary | ICD-10-CM

## 2020-07-17 DIAGNOSIS — F0390 Unspecified dementia without behavioral disturbance: Secondary | ICD-10-CM

## 2020-07-17 DIAGNOSIS — I6381 Other cerebral infarction due to occlusion or stenosis of small artery: Secondary | ICD-10-CM

## 2020-07-17 NOTE — Telephone Encounter (Signed)
-----   Message from Garvin Fila, MD sent at 07/13/2020  9:37 AM EDT ----- Mitchell Heir inform the patient that lab work for reversible causes of memory loss was mostly normal except for elevated homocystine which is protein in the blood.  She needs to start taking folic acid 1 mg daily and repeat this test in 2 months.  In case she is smoking she needs to quit that.

## 2020-07-17 NOTE — Telephone Encounter (Signed)
Attempted to call patient but no answer or voicemail to leave message

## 2020-07-17 NOTE — Patient Instructions (Signed)
Visit Information  PATIENT GOALS: Goals Addressed            This Visit's Progress   . Harm or Injury Prevented   On track    Timeframe:  Long-Range Goal Priority:  High Start Date:  07/05/20                           Expected End Date:                        Follow-up: 08/08/20  . Take medication as prescribed . Keep all medical appointments . Seek appropriate medical attention for any new or worsening symptoms . Avoid driving and ask family/friends for transportation  . Move carefully to avoid falls and use assistive devices as needed . Call RN Care Manager as needed    . Optimize Cognitive Functioning   Not on track    Timeframe:  Long-Range Goal Priority:  High Start Date: 07/17/20                            Expected End Date:   03/09/21                    Follow Up Date 08/08/20   . Talk with friends/family daily . Take medication daily as prescribed . Use a pill box to organize your medications . Do puzzles, word search, sodoku, etc . Exercise as tolerated . Get outside everyday . Keep all medical appointments . Call RN Care Manager as needed [phone]    Why is this important?    As we age, or sometimes because we have an illness, it feels like our memory and ability to figure things out is not very good.   There are things you can do to keep your memory and your thinking as strong as possible.    Notes:        The patient verbalized understanding of instructions, educational materials, and care plan provided today and declined offer to receive copy of patient instructions, educational materials, and care plan.    Follow Up Plan:  . Telephone follow up appointment with care management team member scheduled for: 08/08/20 with RNCM . The patient has been provided with contact information for the care management team and has been advised to call with any health related questions or concerns.  . Next PCP appointment scheduled for: 08/02/20 with Dr Livia Snellen . Next  Neurology appointment scheduled for: EEG on 07/26/20 and Dr Leonie Man on 10/15/20  Chong Sicilian, BSN, RN-BC [place] [facility] [place] [facility] Direct Dial: [phone]

## 2020-07-17 NOTE — Chronic Care Management (AMB) (Signed)
Chronic Care Management   CCM RN Visit Note  07/17/2020 Name: [name] MRN: [id number] DOB: February 17, 1929  Subjective: [name] is a 87 y.o. year old female who is a primary care patient of Stacks, Cletus Gash, MD. The care management team was consulted for assistance with disease management and care coordination needs.    Engaged with patient by telephone for follow up visit in response to provider referral for case management and/or care coordination services.   Consent to Services:  The patient was given information about Chronic Care Management services, agreed to services, and gave verbal consent prior to initiation of services.  Please see initial visit note for detailed documentation.   Patient agreed to services and verbal consent obtained.   Assessment: Review of patient past medical history, allergies, medications, health status, including review of consultants reports, laboratory and other test data, was performed as part of comprehensive evaluation and provision of chronic care management services.   SDOH (Social Determinants of Health) assessments and interventions performed:    CCM Care Plan  Allergies  Allergen Reactions  . Actonel [Risedronate Sodium] Nausea And Vomiting  . Aspirin-Caffeine     Upset stomach   . Codeine     REACTION: upset stomach  . Sulfonamide Derivatives     REACTION: swollen tongue    Outpatient Encounter Medications as of 07/17/2020  Medication Sig  . amoxicillin (AMOXIL) 500 MG tablet Take 500 mg by mouth. Take four tablets 1 hour before dental procedures  . atorvastatin (LIPITOR) 10 MG tablet Take 1 tablet (10 mg total) by mouth daily.  . Biotin 1000 MCG tablet Take 1,000 mcg by mouth daily.  . Cholecalciferol (VITAMIN D3) 10000 UNITS capsule Take 10,000 Units by mouth daily.    Marland Kitchen donepezil (ARICEPT ODT) 5 MG disintegrating tablet Take 1 tablet (5 mg total) by mouth at bedtime.  . folic acid (FOLVITE) 1 MG tablet Take 1 tablet (1 mg  total) by mouth daily.  Marland Kitchen losartan (COZAAR) 25 MG tablet Take 1 tablet (25 mg total) by mouth daily.  . memantine (NAMENDA TITRATION PAK) tablet pack 5 mg/day for =1 week; 5 mg twice daily for =1 week; 15 mg/day given in 5 mg and 10 mg separated doses for =1 week; then 10 mg twice daily  . memantine (NAMENDA) 10 MG tablet Take 1 tablet (10 mg total) by mouth 2 (two) times daily. Start this only after finishing titration pack  . metoprolol succinate (TOPROL-XL) 25 MG 24 hr tablet Take 0.5 tablets (12.5 mg total) by mouth daily.   No facility-administered encounter medications on file as of 07/17/2020.    Patient Active Problem List   Diagnosis Date Noted  . Senile dementia without behavioral disturbance ([place]) 07/09/2020  . Paroxysmal atrial fibrillation ([place]) 04/09/2020  . Cardiomyopathy ([place]) 04/09/2020  . Iron deficiency anemia due to chronic blood loss 01/12/2017  . Hypothyroidism 01/06/2017  . RBBB 10/12/2013  . Varicose veins 10/12/2013  . Fatigue 11/06/2011  . Orthostatic hypotension 11/06/2011  . Mitral valve failure 10/09/2010  . PULMONARY HYPERTENSION, MILD 09/26/2009  . DIVERTICULOSIS, COLON 09/26/2009  . TRANSIENT ISCHEMIC ATTACKS, HX OF 09/26/2009  . COLONIC POLYPS, ADENOMATOUS, HX OF 09/26/2009  . ISCHEMIC COLITIS, HX OF 09/26/2009  . Essential hypertension 08/26/2008    Conditions to be addressed/monitored:Atrial Fibrillation, Dementia and CVA  Care Plan : RNCM:Stroke (Adult)  Updates made by Ilean China, RN since 07/17/2020 12:00 AM    Problem: Harm or Injury (Stroke)   Priority:  High    Goal: Harm or Injury Prevented   Start Date: 07/05/2020  This Visit's Progress: On track  Recent Progress: On track  Priority: High  Note:   Current Barriers:  . Chronic Disease Management support and education needs related to lacunar infarct . Unable to independently drive . Comorbidities: HTN, Afib, dementia  Nurse Case Manager Clinical Goal(s):  . patient will work  with neurologist to address needs related to medical management of lacunar infarct and prevention of future strokes . patient will meet with RN Care Manager to address self-management of residual effects and stroke prevention  Interventions:  . 1:1 collaboration with [name], MD regarding development and update of comprehensive plan of care as evidenced by provider attestation and co-signature . Inter-disciplinary care team collaboration (see longitudinal plan of care) . Evaluation of current treatment plan related to lacunar infarct and patient's adherence to plan as established by provider. . Chart reviewed including relevant office notes, lab results, imaging reports, and referral notes . Reviewed and discussed medications with patient . Discussed family/social support o Has great support from daughters and they help with transportation to her appointments . Fall screening performed . Discussed safety and fall prevention . Discussed driving. Patient enjoys being independent but recognizes that is not safe for her to drive now. . Therapeutic listening utilized . Provided verbal education of fall prevention strategies and assessed current level of knowledge . Reviewed and discussed upcoming appointments with providers and for EEG and MRI . Reinforced need to seek appropriate medical attention for any new or worsening symptoms . Discussed plans with patient for ongoing care management follow up and provided patient with direct contact information for care management team . Provided with RNCM contact number and encouraged to reach out as needed  Self Care Activities:  . Self administers medications as prescribed . Attends all scheduled provider appointments . Performs ADL's independently . Calls provider office for new concerns or questions  Patient Goals Over the next 30 days, patient will: . Take medication as prescribed . Keep all medical appointments . Seek appropriate  medical attention for any new or worsening symptoms . Avoid driving and ask family/friends for transportation  . Move carefully to avoid falls and use assistive devices as needed . Call RN Care Manager as needed    Care Plan : RNCM: Dementia (Adult)  Updates made by Ilean China, RN since 07/17/2020 12:00 AM    Problem: Cognitive Function     Long-Range Goal: Optimal Cognitive Function   Start Date: 07/17/2020  This Visit's Progress: Not on track  Priority: High  Note:   Current Barriers:  . Chronic Disease Management support and education needs related to dementia . Unable to independently drive . Unable to perform IADLs independently . Comorbidities: HTN, Afib, CVA  Nurse Case Manager Clinical Goal(s):  . patient will work with neurologist to address needs related to medical management of dementia . patient will meet with RN Care Manager to address self-management of dementia . patient will demonstrate improved adherence to prescribed treatment plan for dementia as evidenced bytaking medication as prescribed  Interventions:  . 1:1 collaboration with [name], MD regarding development and update of comprehensive plan of care as evidenced by provider attestation and co-signature . Inter-disciplinary care team collaboration (see longitudinal plan of care) . Evaluation of current treatment plan related to dementia and patient's adherence to plan as established by provider. . Chart reviewed including relevant office notes and lab results . Reviewed and  discussed medications o Dr Leonie Man added Namenda at 07/09/20 office visit. Pt is tolerating it well.  o He also ordered folic acid which patient has not started.  - [place] [phone] and verified that the prescription is ready for pickup - Notified patient/daughter that prescription is ready for pickup o Patient is confused about Aricept. She discontinued it when she started taking Namenda. Dr Clydene Fake plan  indicated that Namenda and Aricept were to be taken together.  - Staff message sent to Dr Leonie Man asking for clarification. Will update patient/daughter once I've received a response.  . Reviewed and discussed upcoming appointments and scheduled tests . Discussed family/social support o Has support from daughters and granddaughter o Daughter, [name], takes her to appointments and picks up her medication and assists with other things as needed . Discussed safety o Patient lives alone but feels safe o She does not drive anymore . Falls assessment performed o No falls since last telephone visit . Reinforced fall prevention strategies . Therapeutic listening utilized in regard to patient's recent decline in memory and her adjustment to that  . Encouraged patient to stay in contact with family daily . Provided contact number for [place] and encouraged to reach out as needed  Self Care Activities:  . Attends all scheduled provider appointments . Performs ADL's independently  Patient Goals Over the next 30 days, patient will: . Talk with friends/family daily . Take medication daily as prescribed . Use a pill box to organize your medications . Do puzzles, word search, sodoku, etc . Exercise as tolerated . Get outside everyday . Keep all medical appointments . Call RN Care Manager as needed [phone]    Follow Up Plan:  . Telephone follow up appointment with care management team member scheduled for: 08/08/20 with RNCM . The patient has been provided with contact information for the care management team and has been advised to call with any health related questions or concerns.  . Next PCP appointment scheduled for: 08/02/20 with Dr Livia Snellen . Next Neurology appointment scheduled for: EEG on 07/26/20 and Dr Leonie Man on 10/15/20  Chong Sicilian, BSN, RN-BC [place] [facility] [place] [facility] Direct Dial: [phone]

## 2020-07-18 ENCOUNTER — Telehealth: Payer: Self-pay | Admitting: Emergency Medicine

## 2020-07-18 NOTE — Telephone Encounter (Signed)
Attempted to call patient.  No answer, No VM

## 2020-07-18 NOTE — Telephone Encounter (Signed)
-----   Message from Garvin Fila, MD sent at 07/13/2020  9:37 AM EDT ----- [name] inform the patient that lab work for reversible causes of memory loss was mostly normal except for elevated homocystine which is protein in the blood.  She needs to start taking folic acid 1 mg daily and repeat this test in 2 months.  In case she is smoking she needs to quit that.

## 2020-07-18 NOTE — Telephone Encounter (Signed)
Daughter called back and stated that a RN from Dr. Livia Snellen (PCP) office reached her and gave her same results.  She picked up the folic acid yesterday and patient started taking already.    Patient denied further questions, verbalized understanding and expressed appreciation for the phone call.

## 2020-07-18 NOTE — Telephone Encounter (Signed)
LVM on mobile phone for return call

## 2020-07-19 ENCOUNTER — Ambulatory Visit: Payer: Medicare HMO | Admitting: Family Medicine

## 2020-07-20 ENCOUNTER — Ambulatory Visit: Payer: Medicare HMO | Admitting: *Deleted

## 2020-07-20 DIAGNOSIS — I6381 Other cerebral infarction due to occlusion or stenosis of small artery: Secondary | ICD-10-CM | POA: Diagnosis not present

## 2020-07-20 DIAGNOSIS — F0390 Unspecified dementia without behavioral disturbance: Secondary | ICD-10-CM | POA: Diagnosis not present

## 2020-07-20 DIAGNOSIS — I10 Essential (primary) hypertension: Secondary | ICD-10-CM | POA: Diagnosis not present

## 2020-07-20 NOTE — Chronic Care Management (AMB) (Signed)
Chronic Care Management   CCM RN Visit Note  07/20/2020 Name: [name] MRN: [id number] DOB: 04-12-1928  Subjective: [name] is a 87 y.o. year old female who is a primary care patient of Stacks, Cletus Gash, MD. The care management team was consulted for assistance with disease management and care coordination needs.    Spoke with daughter, [name], by telephone for follow up visit in response to provider referral for case management and/or care coordination services.   Consent to Services:  The patient was given information about Chronic Care Management services, agreed to services, and gave verbal consent prior to initiation of services.  Please see initial visit note for detailed documentation.   Patient agreed to services and verbal consent obtained.   Assessment: Review of patient past medical history, allergies, medications, health status, including review of consultants reports, laboratory and other test data, was performed as part of comprehensive evaluation and provision of chronic care management services.   SDOH (Social Determinants of Health) assessments and interventions performed:    CCM Care Plan  Allergies  Allergen Reactions  . Actonel [Risedronate Sodium] Nausea And Vomiting  . Aspirin-Caffeine     Upset stomach   . Codeine     REACTION: upset stomach  . Sulfonamide Derivatives     REACTION: swollen tongue    Outpatient Encounter Medications as of 07/20/2020  Medication Sig  . amoxicillin (AMOXIL) 500 MG tablet Take 500 mg by mouth. Take four tablets 1 hour before dental procedures  . atorvastatin (LIPITOR) 10 MG tablet Take 1 tablet (10 mg total) by mouth daily.  . Biotin 1000 MCG tablet Take 1,000 mcg by mouth daily.  . Cholecalciferol (VITAMIN D3) 10000 UNITS capsule Take 10,000 Units by mouth daily.    Marland Kitchen donepezil (ARICEPT ODT) 5 MG disintegrating tablet Take 1 tablet (5 mg total) by mouth at bedtime.  . folic acid (FOLVITE) 1 MG tablet Take 1 tablet (1  mg total) by mouth daily.  Marland Kitchen losartan (COZAAR) 25 MG tablet Take 1 tablet (25 mg total) by mouth daily.  . memantine (NAMENDA TITRATION PAK) tablet pack 5 mg/day for =1 week; 5 mg twice daily for =1 week; 15 mg/day given in 5 mg and 10 mg separated doses for =1 week; then 10 mg twice daily  . memantine (NAMENDA) 10 MG tablet Take 1 tablet (10 mg total) by mouth 2 (two) times daily. Start this only after finishing titration pack  . metoprolol succinate (TOPROL-XL) 25 MG 24 hr tablet Take 0.5 tablets (12.5 mg total) by mouth daily.   No facility-administered encounter medications on file as of 07/20/2020.    Patient Active Problem List   Diagnosis Date Noted  . Senile dementia without behavioral disturbance ([place]) 07/09/2020  . Paroxysmal atrial fibrillation ([place]) 04/09/2020  . Cardiomyopathy ([place]) 04/09/2020  . Iron deficiency anemia due to chronic blood loss 01/12/2017  . Hypothyroidism 01/06/2017  . RBBB 10/12/2013  . Varicose veins 10/12/2013  . Fatigue 11/06/2011  . Orthostatic hypotension 11/06/2011  . Mitral valve failure 10/09/2010  . PULMONARY HYPERTENSION, MILD 09/26/2009  . DIVERTICULOSIS, COLON 09/26/2009  . TRANSIENT ISCHEMIC ATTACKS, HX OF 09/26/2009  . COLONIC POLYPS, ADENOMATOUS, HX OF 09/26/2009  . ISCHEMIC COLITIS, HX OF 09/26/2009  . Essential hypertension 08/26/2008    Conditions to be addressed/monitored:Atrial Fibrillation, HTN and Dementia  Care Plan : RNCM: Dementia (Adult)  Updates made by Ilean China, RN since 07/20/2020 12:00 AM    Problem: Cognitive Function   Priority:  High    Long-Range Goal: Optimal Cognitive Function   Start Date: 07/17/2020  This Visit's Progress: On track  Recent Progress: Not on track  Priority: High  Note:   Current Barriers:  . Chronic Disease Management support and education needs related to dementia . Unable to independently drive . Unable to perform IADLs independently . Comorbidities: HTN, Afib, CVA  Nurse Case  Manager Clinical Goal(s):  . patient will work with neurologist to address needs related to medical management of dementia . patient will meet with RN Care Manager to address self-management of dementia . patient will demonstrate improved adherence to prescribed treatment plan for dementia as evidenced bytaking medication as prescribed  Interventions:  . 1:1 collaboration with [name], MD regarding development and update of comprehensive plan of care as evidenced by provider attestation and co-signature . Inter-disciplinary care team collaboration (see longitudinal plan of care) . Evaluation of current treatment plan related to dementia and patient's adherence to plan as established by provider. . Chart reviewed including relevant office notes and lab results . Reviewed and discussed medications o Dr Leonie Man added Namenda at 07/09/20 office visit. Pt is tolerating it well.  o Has started folic acid since last telephone visit . Consulted with Dr Leonie Man and advised daughter, [name], that patient should be taking both Aricept and Namenda . Reviewed and discussed upcoming appointments and scheduled tests . Discussed family/social support o Has support from daughters and granddaughter o Daughter, [name], takes her to appointments and picks up her medication and assists with other things as needed . Discussed safety o Patient lives alone but feels safe o She does not drive anymore o Has family and friends that check in on her throughout the day . Therapeutic listening utilized in regard to patient's recent decline in memory and her adjustment to that  . Previously encouraged patient to stay in contact with family daily . Provided contact number for [place] and encouraged to reach out as needed  Self Care Activities:  . Attends all scheduled provider appointments . Performs ADL's independently  Patient Goals Over the next 30 days, patient will: . Talk with friends/family  daily . Take Folic Acid, Namenda, and Aricept daily as prescribed . Use a pill box to organize your medications . Do puzzles, word search, sodoku, etc . Exercise as tolerated . Get outside everyday . Keep all medical appointments . Call RN Care Manager as needed [phone]  Follow Up Plan:  . Telephone follow up appointment with care management team member scheduled for: 08/08/20 with RNCM . The patient has been provided with contact information for the care management team and has been advised to call with any health related questions or concerns.  . Next PCP appointment scheduled for: 08/02/20 with Dr Livia Snellen . Next Neurology appointment scheduled for: EEG on 07/26/20 and Dr Leonie Man on 10/15/20 Current Barriers:  . Chronic Disease Management support and education needs related to dementia . Unable to independently drive . Unable to perform IADLs independently . Comorbidities: HTN, Afib, CVA  Nurse Case Manager Clinical Goal(s):  . patient will work with neurologist to address needs related to medical management of dementia . patient will meet with RN Care Manager to address self-management of dementia . patient will demonstrate improved adherence to prescribed treatment plan for dementia as evidenced bytaking medication as prescribed  Interventions:  . 1:1 collaboration with [name], MD regarding development and update of comprehensive plan of care as evidenced by provider attestation and co-signature .  Inter-disciplinary care team collaboration (see longitudinal plan of care) . Evaluation of current treatment plan related to dementia and patient's adherence to plan as established by provider. . Chart reviewed including relevant office notes and lab results . Reviewed and discussed medications o Dr Leonie Man added Namenda at 07/09/20 office visit. Pt is tolerating it well.  o Has started folic acid since last telephone visit . Consulted with Dr Leonie Man and advised daughter, [name], that  patient should be taking both Aricept and Namenda . Reviewed and discussed upcoming appointments and scheduled tests . Discussed family/social support o Has support from daughters and granddaughter o Daughter, [name], takes her to appointments and picks up her medication and assists with other things as needed . Discussed safety o Patient lives alone but feels safe o She does not drive anymore o Has family and friends that check in on her throughout the day . Therapeutic listening utilized in regard to patient's recent decline in memory and her adjustment to that  . Previously encouraged patient to stay in contact with family daily . Provided contact number for [place] and encouraged to reach out as needed  Self Care Activities:  . Attends all scheduled provider appointments . Performs ADL's independently  Patient Goals Over the next 30 days, patient will: . Talk with friends/family daily . Take Folic Acid, Namenda, and Aricept daily as prescribed . Use a pill box to organize your medications . Do puzzles, word search, sodoku, etc . Exercise as tolerated . Get outside everyday . Keep all medical appointments . Call RN Care Manager as needed [phone]    Follow Up Plan:  . Telephone follow up appointment with care management team member scheduled for: 08/08/20 with RNCM . The patient has been provided with contact information for the care management team and has been advised to call with any health related questions or concerns.  . Next PCP appointment scheduled for: 08/02/20 with Dr Livia Snellen . Next Neurology appointment scheduled for: EEG on 07/26/20 and Dr Leonie Man on 10/15/20  Chong Sicilian, BSN, RN-BC [place] [facility] [place] [facility] Direct Dial: [phone]

## 2020-07-20 NOTE — Patient Instructions (Signed)
Visit Information  PATIENT GOALS: Goals Addressed            This Visit's Progress   . Optimize Cognitive Functioning   On track    Timeframe:  Long-Range Goal Priority:  High Start Date: 07/17/20                            Expected End Date:   03/09/21                    Follow Up Date 08/08/20   . Talk with friends/family daily . Take Folic Acid, Namenda, and Aricept daily as prescribed . Use a pill box to organize your medications . Do puzzles, word search, sodoku, etc . Exercise as tolerated . Get outside everyday . Keep all medical appointments . Call RN Care Manager as needed [phone]    Why is this important?    As we age, or sometimes because we have an illness, it feels like our memory and ability to figure things out is not very good.   There are things you can do to keep your memory and your thinking as strong as possible.    Notes:        The patient verbalized understanding of instructions, educational materials, and care plan provided today and declined offer to receive copy of patient instructions, educational materials, and care plan.    Follow Up Plan:  . Telephone follow up appointment with care management team member scheduled for: 08/08/20 with RNCM . The patient has been provided with contact information for the care management team and has been advised to call with any health related questions or concerns.  . Next PCP appointment scheduled for: 08/02/20 with Dr Livia Snellen . Next Neurology appointment scheduled for: EEG on 07/26/20 and Dr Leonie Man on 10/15/20  Chong Sicilian, BSN, RN-BC [place] [facility] [place] [facility] Direct Dial: [phone]

## 2020-07-24 NOTE — Progress Notes (Signed)
Patient ID: BASMA BUCHNER, female   DOB: 04-22-1928, 87 y.o.   MRN: [id number]      87 y.o. f/u post MV repair TTE 01/12/19 showed trace MR with low diastolic gradients and normal EF 60%.  She has HTN on ARB More dizziness on 50 mg Cozaar dose decreased. She thinks statin Makes her feel worse and now taking Qod.  Some LE varicosities   Depression continues. Husband [name] was a patient of mine and died of lung cancer 5 years ago They were married 74 years. Shortly after that [name] died tragically in there back yard of a chain saw accident. Son [name] living with her and two daughters near by.   Hospitalized at [facility] end of January with nausea and abdominal pain  Troponin elevated. Echo report  04/06/20 indicates EF 40-45% but did not mention MV repair called it MAC trace MR Noted apical hypokinesis  No chest pain or acute ECG changes Myovue done 04/09/20  and low risk no ischemia EF 53% . Patient did not want cath CT with ? Mild enteritis   Since then has had some memory difficulties and confusion Seen by neurology Dr Leonie Man Aricept tried 06/14/20 without much change He then tried Namenda MRI with small chronic lacunar infarcts and chronic scattered micro hemorrhages cannot r/o amyloid angiopathy. He was wondering about starting anticoagulation for her history of PAF    She thinks Dr Quinn Axe is trying to put her on too many pills BP ok at home   ROS: Denies fever, malais, weight loss, blurry vision, decreased visual acuity, cough, sputum, SOB, hemoptysis, pleuritic pain, palpitaitons, heartburn, abdominal pain, melena, lower extremity edema, claudication, or rash.  All other systems reviewed and negative  Affect appropriate Healthy:  appears stated age 11: normal Neck supple with no adenopathy JVP normal no bruits no thyromegaly Lungs clear with no wheezing and good diaphragmatic motion Heart:  S1/S2 no murmur, no rub, gallop or click PMI normal  Post sternotomy  Abdomen: benighn, BS  positve, no tenderness, no AAA no bruit.  No HSM or HJR Distal pulses intact with no bruits No edema Neuro non-focal Skin warm and dry No muscular weakness   Current Outpatient Medications  Medication Sig Dispense Refill  . amoxicillin (AMOXIL) 500 MG tablet Take 500 mg by mouth. Take four tablets 1 hour before dental procedures    . atorvastatin (LIPITOR) 10 MG tablet Take 1 tablet (10 mg total) by mouth daily. 90 tablet 3  . Biotin 1000 MCG tablet Take 1,000 mcg by mouth daily.    . Cholecalciferol (VITAMIN D3) 10000 UNITS capsule Take 10,000 Units by mouth daily.      Marland Kitchen donepezil (ARICEPT ODT) 5 MG disintegrating tablet Take 1 tablet (5 mg total) by mouth at bedtime. 30 tablet 1  . folic acid (FOLVITE) 1 MG tablet Take 1 tablet (1 mg total) by mouth daily. 100 tablet 3  . losartan (COZAAR) 25 MG tablet Take 1 tablet (25 mg total) by mouth daily. 90 tablet 3  . memantine (NAMENDA TITRATION PAK) tablet pack 5 mg/day for =1 week; 5 mg twice daily for =1 week; 15 mg/day given in 5 mg and 10 mg separated doses for =1 week; then 10 mg twice daily 49 tablet 12  . memantine (NAMENDA) 10 MG tablet Take 1 tablet (10 mg total) by mouth 2 (two) times daily. Start this only after finishing titration pack 60 tablet 3  . metoprolol succinate (TOPROL-XL) 25 MG 24 hr tablet Take  0.5 tablets (12.5 mg total) by mouth daily. 45 tablet 3   No current facility-administered medications for this visit.    Allergies  Actonel [risedronate sodium], Aspirin-caffeine, Codeine, and Sulfonamide derivatives  Electrocardiogram:   12/02/17  SR rate 62 LAD RBBB 07/30/2020 SR rate 58 RBBB/LAD    Assessment and Plan  MV Repair:  SBE prophylaxis discussed  Echo 01/12/19 with EF 60-65% with only trace MR and normal diastolic gradients normal function    HTN: Well controlled.  Continue current medications and low sodium Dash type diet.    Thyroid:  On replacement TSH 3.14 September 2019 normal   PAF: IN NSR continue  current meds   Varicose Veins:  Stable continue support hose   Depression:  Husband [name] died of lung cancer 2015/05/04 Son [name] died 12-31-16   Enteritis:  Improved etiology not clear   Elevated Troponin :  No further pain not clear she had cardiac event low risk myovue given age observe   Memory:  Likely senile dementia she has been in NSR for a long time given lacunar nature of MRI abnormalities and ? Amyloid angiopathy would not anticoagulate especially given age. Continue Namenda f/u Dr Danice Goltz

## 2020-07-26 ENCOUNTER — Ambulatory Visit: Payer: Medicare HMO | Admitting: Neurology

## 2020-07-26 DIAGNOSIS — R410 Disorientation, unspecified: Secondary | ICD-10-CM | POA: Diagnosis not present

## 2020-07-26 DIAGNOSIS — R413 Other amnesia: Secondary | ICD-10-CM

## 2020-07-27 ENCOUNTER — Telehealth: Payer: Medicare HMO

## 2020-07-27 NOTE — Progress Notes (Signed)
Kindly inform the patient that EEG study was normal

## 2020-07-30 ENCOUNTER — Other Ambulatory Visit: Payer: Self-pay

## 2020-07-30 ENCOUNTER — Encounter: Payer: Self-pay | Admitting: Cardiovascular Disease

## 2020-07-30 ENCOUNTER — Ambulatory Visit: Payer: Medicare HMO | Admitting: Cardiovascular Disease

## 2020-07-30 VITALS — BP 138/88 | HR 76 | Ht 63.0 in | Wt 98.0 lb

## 2020-07-30 DIAGNOSIS — I10 Essential (primary) hypertension: Secondary | ICD-10-CM

## 2020-07-30 DIAGNOSIS — R778 Other specified abnormalities of plasma proteins: Secondary | ICD-10-CM | POA: Diagnosis not present

## 2020-07-30 DIAGNOSIS — F0150 Vascular dementia without behavioral disturbance: Secondary | ICD-10-CM | POA: Diagnosis not present

## 2020-07-30 DIAGNOSIS — Z9889 Other specified postprocedural states: Secondary | ICD-10-CM

## 2020-07-30 DIAGNOSIS — I480 Paroxysmal atrial fibrillation: Secondary | ICD-10-CM | POA: Diagnosis not present

## 2020-07-30 NOTE — Patient Instructions (Signed)
Medication Instructions:  NO CHANGES *If you need a refill on your cardiac medications before your next appointment, please call your pharmacy*   Lab Work: NONE If you have labs (blood work) drawn today and your tests are completely normal, you will receive your results only by: Marland Kitchen MyChart Message (if you have MyChart) OR . A paper copy in the mail If you have any lab test that is abnormal or we need to change your treatment, we will call you to review the results.   Testing/Procedures: NON   Follow-Up: At [facility], you and your health needs are our priority.  As part of our continuing mission to provide you with exceptional heart care, we have created designated Provider Care Teams.  These Care Teams include your primary Cardiologist (physician) and Advanced Practice Providers (APPs -  Physician Assistants and Nurse Practitioners) who all work together to provide you with the care you need, when you need it.  We recommend signing up for the patient portal called "MyChart".  Sign up information is provided on this After Visit Summary.  MyChart is used to connect with patients for Virtual Visits (Telemedicine).  Patients are able to view lab/test results, encounter notes, upcoming appointments, etc.  Non-urgent messages can be sent to your provider as well.   To learn more about what you can do with MyChart, go to [email or web address].    Your next appointment:   6 month(s)  The format for your next appointment:   In Person  Provider:   [name], MD   Other Instructions NONE

## 2020-07-31 ENCOUNTER — Telehealth: Payer: Self-pay | Admitting: Emergency Medicine

## 2020-07-31 NOTE — Telephone Encounter (Signed)
-----   Message from Garvin Fila, MD sent at 07/27/2020  8:59 AM EDT ----- Mitchell Heir inform the patient that EEG study was normal

## 2020-07-31 NOTE — Telephone Encounter (Signed)
LVM for patient to return call. 

## 2020-08-01 NOTE — Telephone Encounter (Signed)
Patient called back for EEG results.  Went over Dr. Clydene Fake review and findings.  Also confirmed follow up on 10/15/20 @ 1430  Patient denied further questions, verbalized understanding and expressed appreciation for the phone call.

## 2020-08-02 ENCOUNTER — Encounter: Payer: Self-pay | Admitting: Family Medicine

## 2020-08-02 ENCOUNTER — Other Ambulatory Visit: Payer: Self-pay

## 2020-08-02 ENCOUNTER — Ambulatory Visit: Payer: Medicare HMO | Admitting: Family Medicine

## 2020-08-02 VITALS — BP 134/58 | HR 66 | Temp 97.2°F | Ht 63.0 in | Wt 98.6 lb

## 2020-08-02 DIAGNOSIS — I10 Essential (primary) hypertension: Secondary | ICD-10-CM

## 2020-08-02 DIAGNOSIS — F0390 Unspecified dementia without behavioral disturbance: Secondary | ICD-10-CM

## 2020-08-02 DIAGNOSIS — G459 Transient cerebral ischemic attack, unspecified: Secondary | ICD-10-CM | POA: Diagnosis not present

## 2020-08-02 MED ORDER — CLOPIDOGREL BISULFATE 75 MG PO TABS: 75.0000 mg | ORAL_TABLET | Freq: Every day | ORAL | 1 refills | Status: DC

## 2020-08-02 NOTE — Progress Notes (Signed)
Subjective:  Patient ID: [name], female    DOB: Aug 14, 1928  Age: 87 y.o. MRN: [id number]  CC: Hypertension   HPI [name] presents for  presents for  follow-up of hypertension. Patient has no history of headache chest pain or shortness of breath or recent cough. Patient denies side effects from medication. States taking it regularly. She had MI 4 months ago.  Multiple TIA's and a recent CVA reviewed. Taking ASA 81 mg when each occurred. Has had recent neurology evaluation for dementia, confusion and dizziness. Report from Jul 09, 2020 reviewed. Namenda was added at that time. Today her daughter states memory symptoms continue. Sub optimal MRI discussed. Suggestive of Lacunar infarctions noted in basal ganglia as well as corona radiata.Micro hemorrhages  scattered throughout .   Depression screen [facility] 2/9 08/02/2020 06/12/2020 05/08/2020  Decreased Interest 0 0 0  Down, Depressed, Hopeless 0 0 0  PHQ - 2 Score 0 0 0    History [name] has a past medical history of Arthritis, Atrial fibrillation ([place]), COVID-19, CVA (cerebral vascular accident) ([place]), HTN (hypertension), MR (mitral regurgitation), NSTEMI (non-ST elevated myocardial infarction) ([place]), and Pericarditis.   She has a past surgical history that includes anterior repair with perigee graft (04/16/06); Anterior and posterior vaginal repair (10/15/04); sacral spinous ligament suspension of vagina; suprapubic cystectomy; Total abdominal hysterectomy w/ bilateral salpingoophorectomy; and Mitral valve replacement.   Her family history includes Colon cancer in her brother; Heart attack in her father; Heart disease in her brother, father, and mother; Lupus in her father.She reports that she has never smoked. She has never used smokeless tobacco. She reports that she does not drink alcohol and does not use drugs.    ROS Review of Systems  Constitutional: Negative.   HENT: Negative.   Eyes: Negative for visual disturbance.  Respiratory:  Negative for shortness of breath.   Cardiovascular: Negative for chest pain.  Gastrointestinal: Negative for abdominal pain.  Musculoskeletal: Negative for arthralgias.    Objective:  BP (!) 134/58   Pulse 66   Temp (!) 97.2 F (36.2 C)   Ht 5\' 3"  (1.6 m)   Wt 98 lb 9.6 oz (44.7 kg)   SpO2 100%   BMI 17.47 kg/m   BP Readings from Last 3 Encounters:  08/02/20 (!) 134/58  07/30/20 138/88  07/09/20 (!) 180/84    Wt Readings from Last 3 Encounters:  08/02/20 98 lb 9.6 oz (44.7 kg)  07/30/20 98 lb (44.5 kg)  07/09/20 99 lb 6.4 oz (45.1 kg)     Physical Exam Constitutional:      General: She is not in acute distress.    Appearance: She is well-developed.  Cardiovascular:     Rate and Rhythm: Normal rate and regular rhythm.  Pulmonary:     Breath sounds: Normal breath sounds.  Skin:    General: Skin is warm and dry.  Neurological:     Mental Status: She is alert and oriented to person, place, and time.       Assessment & Plan:   [name] was seen today for hypertension.  Diagnoses and all orders for this visit:  Essential hypertension  TIA (transient ischemic attack)  Senile dementia without behavioral disturbance ([place])  Other orders -     clopidogrel (PLAVIX) 75 MG tablet; Take 1 tablet (75 mg total) by mouth daily.   HEr dementia seems related to multiple factors. These include TIAs and lacunar infarctions. Plavix plus aspirin should help prevent further lacunar infarction.  Concern remains, however for microhemorrhages. She will need close monitoring and followup MRI at some point as well based on response.    I am having [name] "[name]" start on clopidogrel. I am also having her maintain her Vitamin D3, amoxicillin, Biotin, atorvastatin, metoprolol succinate, losartan, donepezil, memantine, memantine, and folic acid.  Allergies as of 08/02/2020      Reactions   Actonel [risedronate Sodium] Nausea And Vomiting   Aspirin-caffeine    Upset stomach    Codeine    REACTION: upset stomach   Sulfonamide Derivatives    REACTION: swollen tongue      Medication List       Accurate as of Aug 02, 2020 11:59 PM. If you have any questions, ask your nurse or doctor.        amoxicillin 500 MG tablet Commonly known as: AMOXIL Take 500 mg by mouth. Take four tablets 1 hour before dental procedures   atorvastatin 10 MG tablet Commonly known as: LIPITOR Take 1 tablet (10 mg total) by mouth daily.   Biotin 1000 MCG tablet Take 1,000 mcg by mouth daily.   clopidogrel 75 MG tablet Commonly known as: PLAVIX Take 1 tablet (75 mg total) by mouth daily. Started by: [name], MD   donepezil 5 MG disintegrating tablet Commonly known as: ARICEPT ODT Take 1 tablet (5 mg total) by mouth at bedtime.   folic acid 1 MG tablet Commonly known as: FOLVITE Take 1 tablet (1 mg total) by mouth daily.   losartan 25 MG tablet Commonly known as: COZAAR Take 1 tablet (25 mg total) by mouth daily.   memantine tablet pack Commonly known as: Namenda Titration Pak 5 mg/day for =1 week; 5 mg twice daily for =1 week; 15 mg/day given in 5 mg and 10 mg separated doses for =1 week; then 10 mg twice daily   memantine 10 MG tablet Commonly known as: Namenda Take 1 tablet (10 mg total) by mouth 2 (two) times daily. Start this only after finishing titration pack   metoprolol succinate 25 MG 24 hr tablet Commonly known as: TOPROL-XL Take 0.5 tablets (12.5 mg total) by mouth daily.   Vitamin D3 250 MCG (10000 UT) capsule Take 10,000 Units by mouth daily.        Follow-up: Return in about 1 month (around 09/02/2020).  [name], M.D.

## 2020-08-08 ENCOUNTER — Telehealth: Payer: Self-pay | Admitting: Family Medicine

## 2020-08-08 ENCOUNTER — Telehealth: Payer: Medicare HMO

## 2020-08-08 ENCOUNTER — Encounter: Payer: Self-pay | Admitting: Family Medicine

## 2020-08-08 DIAGNOSIS — G459 Transient cerebral ischemic attack, unspecified: Secondary | ICD-10-CM | POA: Insufficient documentation

## 2020-08-08 NOTE — Telephone Encounter (Signed)
Pts daughter called stating that pt is unable to take her Plavix Rx because it is causing her to look wild eyed and feel weird and confused.   Needs advise on what to do. Please advise and call patients daughter.  [phone]

## 2020-08-08 NOTE — Telephone Encounter (Signed)
I do not know that that is a reaction or side effects from the Plavix, especially considering they think she may have just had a stroke but they can go ahead and hold it for now and come back and discuss it with Dr. Livia Snellen when he returns.

## 2020-08-08 NOTE — Telephone Encounter (Signed)
Sending to back up provider:  See Stacks OV note from 5/26 - new start  Please address

## 2020-08-08 NOTE — Telephone Encounter (Signed)
Makes her feel nervous, more confusion and states her body feels "different"   Aware we will forward to stacks to address once back from vacation

## 2020-08-09 ENCOUNTER — Ambulatory Visit (INDEPENDENT_AMBULATORY_CARE_PROVIDER_SITE_OTHER): Payer: Medicare HMO | Admitting: *Deleted

## 2020-08-09 ENCOUNTER — Encounter: Payer: Self-pay | Admitting: *Deleted

## 2020-08-09 DIAGNOSIS — I480 Paroxysmal atrial fibrillation: Secondary | ICD-10-CM

## 2020-08-09 DIAGNOSIS — F0390 Unspecified dementia without behavioral disturbance: Secondary | ICD-10-CM | POA: Diagnosis not present

## 2020-08-09 DIAGNOSIS — C4441 Basal cell carcinoma of skin of scalp and neck: Secondary | ICD-10-CM | POA: Diagnosis not present

## 2020-08-14 NOTE — Telephone Encounter (Signed)
She is okay to DC plavix. Take ASA - enteric coated, 325 mg tab once a day instead.

## 2020-08-18 ENCOUNTER — Other Ambulatory Visit: Payer: Self-pay | Admitting: Family Medicine

## 2020-08-28 ENCOUNTER — Ambulatory Visit: Payer: Medicare Other | Admitting: Cardiovascular Disease

## 2020-09-06 NOTE — Patient Instructions (Signed)
Visit Information  PATIENT GOALS:  Goals Addressed             This Visit's Progress    Optimize Cognitive Functioning   On track    Timeframe:  Long-Range Goal Priority:  High Start Date: 07/17/20                            Expected End Date:   03/09/21                    Follow Up Date 09/13/20   Talk with friends/family daily Take Folic Acid, Namenda, and Aricept daily as prescribed Use a pill box to organize your medications Do puzzles, word search, sodoku, etc Exercise as tolerated Get outside everyday Keep all medical appointments Call RN Care Manager as needed [phone]    Why is this important?   As we age, or sometimes because we have an illness, it feels like our memory and ability to figure things out is not very good.  There are things you can do to keep your memory and your thinking as strong as possible.    Notes:          The patient verbalized understanding of instructions, educational materials, and care plan provided today and declined offer to receive copy of patient instructions, educational materials, and care plan.   Telephone follow up appointment with care management team member scheduled for: 09/13/20 with RNCM  Chong Sicilian, BSN, RN-BC [place] / [place] Management Direct Dial: [phone]

## 2020-09-06 NOTE — Chronic Care Management (AMB) (Signed)
Chronic Care Management   CCM RN Visit Note  08/09/2020 Name: [name] MRN: [id number] DOB: [birth year]  Subjective: [name] is a 87 y.o. year old female who is a primary care patient of Stacks, Cletus Gash, MD. The care management team was consulted for assistance with disease management and care coordination needs.    Engaged with patient by telephone for follow up visit in response to provider referral for case management and/or care coordination services.   Consent to Services:  The patient was given information about Chronic Care Management services, agreed to services, and gave verbal consent prior to initiation of services.  Please see initial visit note for detailed documentation.   Patient agreed to services and verbal consent obtained.   Assessment: Review of patient past medical history, allergies, medications, health status, including review of consultants reports, laboratory and other test data, was performed as part of comprehensive evaluation and provision of chronic care management services.   SDOH (Social Determinants of Health) assessments and interventions performed:    CCM Care Plan  Allergies  Allergen Reactions   Actonel [Risedronate Sodium] Nausea And Vomiting   Aspirin-Caffeine     Upset stomach    Codeine     REACTION: upset stomach   Sulfonamide Derivatives     REACTION: swollen tongue    Outpatient Encounter Medications as of 08/09/2020  Medication Sig   amoxicillin (AMOXIL) 500 MG tablet Take 500 mg by mouth. Take four tablets 1 hour before dental procedures   atorvastatin (LIPITOR) 10 MG tablet Take 1 tablet (10 mg total) by mouth daily.   Biotin 1000 MCG tablet Take 1,000 mcg by mouth daily.   Cholecalciferol (VITAMIN D3) 10000 UNITS capsule Take 10,000 Units by mouth daily.     clopidogrel (PLAVIX) 75 MG tablet Take 1 tablet (75 mg total) by mouth daily.   folic acid (FOLVITE) 1 MG tablet Take 1 tablet (1 mg total) by mouth daily.   losartan  (COZAAR) 25 MG tablet Take 1 tablet (25 mg total) by mouth daily.   memantine (NAMENDA TITRATION PAK) tablet pack 5 mg/day for =1 week; 5 mg twice daily for =1 week; 15 mg/day given in 5 mg and 10 mg separated doses for =1 week; then 10 mg twice daily   memantine (NAMENDA) 10 MG tablet Take 1 tablet (10 mg total) by mouth 2 (two) times daily. Start this only after finishing titration pack   metoprolol succinate (TOPROL-XL) 25 MG 24 hr tablet Take 0.5 tablets (12.5 mg total) by mouth daily.   [DISCONTINUED] donepezil (ARICEPT ODT) 5 MG disintegrating tablet Take 1 tablet (5 mg total) by mouth at bedtime.   No facility-administered encounter medications on file as of 08/09/2020.    Patient Active Problem List   Diagnosis Date Noted   TIA (transient ischemic attack) 08/08/2020   Senile dementia without behavioral disturbance ([place]) 07/09/2020   Paroxysmal atrial fibrillation ([place]) 04/09/2020   Cardiomyopathy ([place]) 04/09/2020   Iron deficiency anemia due to chronic blood loss 01/12/2017   Hypothyroidism 01/06/2017   RBBB 10/12/2013   Varicose veins 10/12/2013   Fatigue 11/06/2011   Orthostatic hypotension 11/06/2011   Mitral valve failure 10/09/2010   PULMONARY HYPERTENSION, MILD 09/26/2009   DIVERTICULOSIS, COLON 09/26/2009   TRANSIENT ISCHEMIC ATTACKS, HX OF 09/26/2009   COLONIC POLYPS, ADENOMATOUS, HX OF 09/26/2009   ISCHEMIC COLITIS, HX OF 09/26/2009   Essential hypertension 08/26/2008    Conditions to be addressed/monitored:Atrial Fibrillation and Dementia    Care Plan :  RNCM: Dementia (Adult)     Problem: Cognitive Function   Priority: High     Long-Range Goal: Optimal Cognitive Function   Start Date: 07/17/2020  This Visit's Progress: On track  Recent Progress: On track  Priority: High  Note:   Current Barriers:  Chronic Disease Management support and education needs related to dementia Unable to independently drive Unable to perform IADLs  independently Comorbidities: HTN, Afib, CVA  Nurse Case Manager Clinical Goal(s):  patient will work with neurologist to address needs related to medical management of dementia in a patient with Afib and hx of CVA patient will meet with RN Care Manager to address self-management of dementia patient will demonstrate improved adherence to prescribed treatment plan for dementia as evidenced bytaking medication as prescribed  Interventions:  1:1 collaboration with [name], MD regarding development and update of comprehensive plan of care as evidenced by provider attestation and co-signature Inter-disciplinary care team collaboration (see longitudinal plan of care) Evaluation of current treatment plan related to dementia and patient's adherence to plan as established by provider. Chart reviewed including relevant office notes and lab results Reviewed and discussed medications and importance of compliance Reviewed and discussed upcoming appointments and scheduled tests Confirmed that patient has family/social support Therapeutic listening utilized in regard to patient's recent decline in memory and her adjustment to that  Provided contact number for [place] and encouraged to reach out as needed  Self Care Activities:  Attends all scheduled provider appointments Performs ADL's independently  Patient Goals Over the next 90 days, patient will: Talk with friends/family daily Take Folic Acid, Namenda, and Aricept daily as prescribed Use a pill box to organize your medications Do puzzles, word search, sodoku, etc Exercise as tolerated Get outside everyday Keep all medical appointments Call RN Care Manager as needed [phone]  Follow Up Plan:  Telephone follow up appointment with care management team member scheduled for: 09/13/20 with RNCM The patient has been provided with contact information for the care management team and has been advised to call with any health related  questions or concerns.      Plan:Telephone follow up appointment with care management team member scheduled for:  09/13/20 with RNCM  Chong Sicilian, BSN, RN-BC [place] / [place] Management Direct Dial: [phone]

## 2020-09-11 DIAGNOSIS — L570 Actinic keratosis: Secondary | ICD-10-CM | POA: Diagnosis not present

## 2020-09-11 DIAGNOSIS — L821 Other seborrheic keratosis: Secondary | ICD-10-CM | POA: Diagnosis not present

## 2020-09-11 DIAGNOSIS — L905 Scar conditions and fibrosis of skin: Secondary | ICD-10-CM | POA: Diagnosis not present

## 2020-09-11 DIAGNOSIS — Z85828 Personal history of other malignant neoplasm of skin: Secondary | ICD-10-CM | POA: Diagnosis not present

## 2020-09-13 ENCOUNTER — Ambulatory Visit (INDEPENDENT_AMBULATORY_CARE_PROVIDER_SITE_OTHER): Payer: Medicare HMO | Admitting: *Deleted

## 2020-09-13 DIAGNOSIS — F0390 Unspecified dementia without behavioral disturbance: Secondary | ICD-10-CM | POA: Diagnosis not present

## 2020-09-13 DIAGNOSIS — I10 Essential (primary) hypertension: Secondary | ICD-10-CM

## 2020-09-14 ENCOUNTER — Encounter: Payer: Self-pay | Admitting: *Deleted

## 2020-09-14 NOTE — Patient Instructions (Signed)
Visit Information  PATIENT GOALS:  Goals Addressed             This Visit's Progress    COMPLETED: Harm or Injury Prevented       Timeframe:  Long-Range Goal Priority:  High Start Date:  07/05/20                           Expected End Date:                        Follow-up: 08/08/20  Take medication as prescribed Keep all medical appointments Seek appropriate medical attention for any new or worsening symptoms Avoid driving and ask family/friends for transportation  Move carefully to avoid falls and use assistive devices as needed Call RN Care Manager as needed      Optimize Cognitive Functioning   On track    Timeframe:  Long-Range Goal Priority:  High Start Date: 07/17/20                            Expected End Date:   03/09/21                    Follow Up Date 10/15/20   Talk with friends/family daily Take Folic Acid, Namenda, and Aricept daily as prescribed Use a pill box to organize your medications Do puzzles, word search, sodoku, etc Exercise as tolerated Get outside everyday Keep all medical appointments Call RN Care Manager as needed [phone] Ask family/friends to assist in driving to appointments and to run errands    Why is this important?   As we age, or sometimes because we have an illness, it feels like our memory and ability to figure things out is not very good.  There are things you can do to keep your memory and your thinking as strong as possible.    Notes:       Track and Manage My Blood Pressure-Hypertension   On track    Timeframe:  Long-Range Goal Priority:  Medium Start Date:   05/07/20                          Expected End Date: 11/04/20                      Follow Up Date 10/15/20   Check and record blood pressure daily Call PCP or cardiologist if blood pressure is running too high or too low Take medications as prescribed Call [place] [phone] as needed Follow a low sodium diet and don't add extra salt to food   Why is  this important?   You won't feel high blood pressure, but it can still hurt your blood vessels.  High blood pressure can cause heart or kidney problems. It can also cause a stroke.  Making lifestyle changes like losing a little weight or eating less salt will help.  Checking your blood pressure at home and at different times of the day can help to control blood pressure.  If the doctor prescribes medicine remember to take it the way the doctor ordered.  Call the office if you cannot afford the medicine or if there are questions about it.     Notes:          The patient verbalized understanding of instructions, educational materials, and  care plan provided today and declined offer to receive copy of patient instructions, educational materials, and care plan.   Telephone follow up appointment with care management team member scheduled for: 10/15/20 with RNCM  Chong Sicilian, BSN, RN-BC [place] / [place] Management Direct Dial: [phone]

## 2020-09-14 NOTE — Chronic Care Management (AMB) (Signed)
Chronic Care Management   CCM RN Visit Note  09/13/2020 Name: [name] MRN: [id number] DOB: 03/24/1928  Subjective: [name] is a 86 y.o. year old female who is a primary care patient of Stacks, Cletus Gash, MD. The care management team was consulted for assistance with disease management and care coordination needs.    Engaged with patient by telephone for follow up visit in response to provider referral for case management and/or care coordination services.   Consent to Services:  The patient was given information about Chronic Care Management services, agreed to services, and gave verbal consent prior to initiation of services.  Please see initial visit note for detailed documentation.   Patient agreed to services and verbal consent obtained.   Assessment: Review of patient past medical history, allergies, medications, health status, including review of consultants reports, laboratory and other test data, was performed as part of comprehensive evaluation and provision of chronic care management services.   SDOH (Social Determinants of Health) assessments and interventions performed:    CCM Care Plan  Allergies  Allergen Reactions   Actonel [Risedronate Sodium] Nausea And Vomiting   Aspirin-Caffeine     Upset stomach    Codeine     REACTION: upset stomach   Sulfonamide Derivatives     REACTION: swollen tongue    Outpatient Encounter Medications as of 09/13/2020  Medication Sig   amoxicillin (AMOXIL) 500 MG tablet Take 500 mg by mouth. Take four tablets 1 hour before dental procedures   atorvastatin (LIPITOR) 10 MG tablet Take 1 tablet (10 mg total) by mouth daily.   Biotin 1000 MCG tablet Take 1,000 mcg by mouth daily.   Cholecalciferol (VITAMIN D3) 10000 UNITS capsule Take 10,000 Units by mouth daily.     clopidogrel (PLAVIX) 75 MG tablet Take 1 tablet (75 mg total) by mouth daily.   donepezil (ARICEPT ODT) 5 MG disintegrating tablet DISSOLVE 1 TABLET BY MOUTH AT BEDTIME    folic acid (FOLVITE) 1 MG tablet Take 1 tablet (1 mg total) by mouth daily.   losartan (COZAAR) 25 MG tablet Take 1 tablet (25 mg total) by mouth daily.   memantine (NAMENDA TITRATION PAK) tablet pack 5 mg/day for =1 week; 5 mg twice daily for =1 week; 15 mg/day given in 5 mg and 10 mg separated doses for =1 week; then 10 mg twice daily   memantine (NAMENDA) 10 MG tablet Take 1 tablet (10 mg total) by mouth 2 (two) times daily. Start this only after finishing titration pack   metoprolol succinate (TOPROL-XL) 25 MG 24 hr tablet Take 0.5 tablets (12.5 mg total) by mouth daily.   No facility-administered encounter medications on file as of 09/13/2020.    Patient Active Problem List   Diagnosis Date Noted   TIA (transient ischemic attack) 08/08/2020   Senile dementia without behavioral disturbance ([place]) 07/09/2020   Paroxysmal atrial fibrillation ([place]) 04/09/2020   Cardiomyopathy ([place]) 04/09/2020   Iron deficiency anemia due to chronic blood loss 01/12/2017   Hypothyroidism 01/06/2017   RBBB 10/12/2013   Varicose veins 10/12/2013   Fatigue 11/06/2011   Orthostatic hypotension 11/06/2011   Mitral valve failure 10/09/2010   PULMONARY HYPERTENSION, MILD 09/26/2009   DIVERTICULOSIS, COLON 09/26/2009   TRANSIENT ISCHEMIC ATTACKS, HX OF 09/26/2009   COLONIC POLYPS, ADENOMATOUS, HX OF 09/26/2009   ISCHEMIC COLITIS, HX OF 09/26/2009   Essential hypertension 08/26/2008    Conditions to be addressed/monitored:HTN and Dementia  Care Plan : RNCM: Hypertension (Adult)  Updates made by  Ilean China, RN since 09/14/2020 12:00 AM     Problem: Hypertension (Hypertension)   Priority: Medium     Long-Range Goal: Hypertension Monitored   This Visit's Progress: On track  Recent Progress: Not on track  Priority: Medium  Note:   Objective BP Readings from Last 3 Encounters:  08/02/20 (!) 134/58  07/30/20 138/88  07/09/20 (!) 180/84  Current Barriers:  Chronic Disease Management support and  education needs related to hypertension in a patient with hyperlipidemia and hypothyroidism with recent MI Unable to independently drive  Nurse Case Manager Clinical Goal(s):  patient will work with Consulting civil engineer to address needs related to self-management of hypertension the patient will demonstrate ongoing self health care management ability  Interventions:  1:1 collaboration with [name], MD regarding development and update of comprehensive plan of care as evidenced by provider attestation and co-signature Inter-disciplinary care team collaboration (see longitudinal plan of care) Evaluation of current treatment plan related to hypertension and patient's adherence to plan as established by provider. Chart reviewed including relevant office notes and lab results Encouraged patient to check and record blood pressure daily and to call PCP or cardio with any readings outside of recommended range Reviewed and discussed medications Patient is compliant with medications and does not have any problems with affordability Reviewed and discussed upcoming appointments Reinforced low sodium, DASH diet Provided with RN Care Manager contact number and encouraged to reach out as needed  Patient Goals/Self-Care Activities Over the next 60 days, patient will: Check and record blood pressure daily Call PCP or cardiologist if blood pressure is running too high or too low Take medications as prescribed Call [place] [phone] as needed Follow a low sodium diet and don't add extra salt to food  Follow Up Plan:  Telephone follow up appointment with care management team member scheduled for: 10/15/20 with [facility] The patient has been provided with contact information for the care management team and has been advised to call with any health related questions or concerns.  Next PCP appointment scheduled for:  07/12/20 with Dr Livia Snellen Neurology appointment scheduled for: 07/09/20         Care  Plan : RNCM:Stroke (Adult)  Updates made by Ilean China, RN since 09/14/2020 12:00 AM  Completed 09/14/2020   Problem: Harm or Injury (Stroke) Resolved 09/14/2020  Priority: High     Goal: Harm or Injury Prevented Completed 09/14/2020  Start Date: 07/05/2020  Recent Progress: On track  Priority: High  Note:   Current Barriers:  Chronic Disease Management support and education needs related to lacunar infarct Unable to independently drive Comorbidities: HTN, Afib, dementia  Nurse Case Manager Clinical Goal(s):  patient will work with neurologist to address needs related to medical management of lacunar infarct and prevention of future strokes patient will meet with RN Care Manager to address self-management of residual effects and stroke prevention  Interventions:  1:1 collaboration with [name], MD regarding development and update of comprehensive plan of care as evidenced by provider attestation and co-signature Inter-disciplinary care team collaboration (see longitudinal plan of care) Evaluation of current treatment plan related to lacunar infarct and patient's adherence to plan as established by provider. Chart reviewed including relevant office notes, lab results, imaging reports, and referral notes Reviewed and discussed medications with patient Discussed family/social support Has great support from daughters and they help with transportation to her appointments Fall screening performed Discussed safety and fall prevention Discussed driving. Patient enjoys being independent but recognizes  that is not safe for her to drive now. Therapeutic listening utilized Provided verbal education of fall prevention strategies and assessed current level of knowledge Reviewed and discussed upcoming appointments with providers and for EEG and MRI Reinforced need to seek appropriate medical attention for any new or worsening symptoms Discussed plans with patient for ongoing care management  follow up and provided patient with direct contact information for care management team Provided with RNCM contact number and encouraged to reach out as needed  Self Care Activities:  Self administers medications as prescribed Attends all scheduled provider appointments Performs ADL's independently Calls provider office for new concerns or questions  Patient Goals Over the next 30 days, patient will: Take medication as prescribed Keep all medical appointments Seek appropriate medical attention for any new or worsening symptoms Avoid driving and ask family/friends for transportation  Move carefully to avoid falls and use assistive devices as needed Call [place] as needed  Follow Up Plan:  Telephone follow up appointment with care management team member scheduled for: 08/08/20 with RNCM The patient has been provided with contact information for the care management team and has been advised to call with any health related questions or concerns.  Next PCP appointment scheduled for: 08/02/20 with Dr Livia Snellen Next Neurology appointment scheduled for: EEG on 07/26/20 and Dr Leonie Man on 10/15/20     Care Plan : RNCM: Dementia (Adult)  Updates made by Ilean China, RN since 09/14/2020 12:00 AM     Problem: Cognitive Function   Priority: High     Long-Range Goal: Optimal Cognitive Function   Start Date: 07/17/2020  This Visit's Progress: On track  Recent Progress: On track  Priority: High  Note:   Current Barriers:  Chronic Disease Management support and education needs related to dementia in a patient with Afib and hx of CVA Unable to independently drive Unable to perform IADLs independently Comorbidities: HTN, Afib, CVA  Nurse Case Manager Clinical Goal(s):  patient will work with neurologist to address needs related to medical management of dementia patient will meet with RN Care Manager to address self-management of dementia patient will demonstrate improved adherence to  prescribed treatment plan for dementia as evidenced bytaking medication as prescribed  Interventions:  1:1 collaboration with [name], MD regarding development and update of comprehensive plan of care as evidenced by provider attestation and co-signature Inter-disciplinary care team collaboration (see longitudinal plan of care) Evaluation of current treatment plan related to dementia and patient's adherence to plan as established by provider. Chart reviewed including relevant office notes and lab results Reviewed and discussed medications and reinforced importance of compliance Reviewed and discussed upcoming appointments and scheduled tests Reconfirmed that patient has family/social support Encouraged patient to continue performing ADLs and discussed the importance of remaining physically and socially active. Patient stated understanding and provided examples of friends that have declined in health because they just sit around all day. She intends to stay active.  Therapeutic listening utilized in regard to patient's recent decline in memory and her adjustment to that  Patient was more cognitively aware during our conversation today. She knew who I was by my voice and was happy that I called because she had something she wanted to talk with me about. She received a call about compliance with atorvastatin and she wasn't sure if it was a legitimate call or not. Her thinking was clear and overall she seemed much improved from previous call. We discussed her recent trip to the [facility] and  eye doctor and that she was able to renew her driver's license. Even though she doesn't intend to drive, she was very happy to have been able to renew that. Patient stated that she has my number written down in another room but she wrote it down again today as well.  Encouraged patient to reach out to [facility] with any questions or concerns  Self Care Activities:  Attends all scheduled provider appointments Performs  ADL's independently  Patient Goals Over the next 90 days, patient will: Talk with friends/family daily Take Folic Acid, Namenda, and Aricept daily as prescribed Use a pill box to organize your medications Do puzzles, word search, sodoku, etc Exercise as tolerated Get outside everyday Keep all medical appointments Call RN Care Manager as needed [phone] Ask family/friends to assist in driving to appointments and to run errands  Follow Up Plan:  Telephone follow up appointment with care management team member scheduled for: 10/15/20 with [facility] The patient has been provided with contact information for the care management team and has been advised to call with any health related questions or concerns.      Plan:Telephone follow up appointment with care management team member scheduled for:  10/15/20 with RNCM  Chong Sicilian, BSN, RN-BC [place] [facility] Direct Dial: [phone]

## 2020-09-26 ENCOUNTER — Inpatient Hospital Stay (HOSPITAL_COMMUNITY)
Admission: EM | Admit: 2020-09-26 | Discharge: 2020-10-04 | DRG: 308 | Disposition: A | Discharge status: Home / Self Care | Payer: Medicare HMO | Attending: Internal Medicine | Admitting: Internal Medicine

## 2020-09-26 ENCOUNTER — Emergency Department (HOSPITAL_COMMUNITY): Payer: Medicare HMO

## 2020-09-26 ENCOUNTER — Ambulatory Visit (INDEPENDENT_AMBULATORY_CARE_PROVIDER_SITE_OTHER): Payer: Medicare HMO | Admitting: Family Medicine

## 2020-09-26 ENCOUNTER — Other Ambulatory Visit: Payer: Self-pay

## 2020-09-26 DIAGNOSIS — E039 Hypothyroidism, unspecified: Secondary | ICD-10-CM | POA: Diagnosis present

## 2020-09-26 DIAGNOSIS — I480 Paroxysmal atrial fibrillation: Secondary | ICD-10-CM | POA: Diagnosis not present

## 2020-09-26 DIAGNOSIS — E785 Hyperlipidemia, unspecified: Secondary | ICD-10-CM | POA: Diagnosis present

## 2020-09-26 DIAGNOSIS — Z8673 Personal history of transient ischemic attack (TIA), and cerebral infarction without residual deficits: Secondary | ICD-10-CM | POA: Diagnosis not present

## 2020-09-26 DIAGNOSIS — I483 Typical atrial flutter: Secondary | ICD-10-CM | POA: Diagnosis not present

## 2020-09-26 DIAGNOSIS — R1084 Generalized abdominal pain: Secondary | ICD-10-CM | POA: Diagnosis not present

## 2020-09-26 DIAGNOSIS — Z8249 Family history of ischemic heart disease and other diseases of the circulatory system: Secondary | ICD-10-CM

## 2020-09-26 DIAGNOSIS — I517 Cardiomegaly: Secondary | ICD-10-CM | POA: Diagnosis not present

## 2020-09-26 DIAGNOSIS — F0390 Unspecified dementia without behavioral disturbance: Secondary | ICD-10-CM | POA: Diagnosis present

## 2020-09-26 DIAGNOSIS — Z8616 Personal history of COVID-19: Secondary | ICD-10-CM

## 2020-09-26 DIAGNOSIS — Z952 Presence of prosthetic heart valve: Secondary | ICD-10-CM

## 2020-09-26 DIAGNOSIS — I4819 Other persistent atrial fibrillation: Principal | ICD-10-CM | POA: Diagnosis present

## 2020-09-26 DIAGNOSIS — Z882 Allergy status to sulfonamides status: Secondary | ICD-10-CM | POA: Diagnosis not present

## 2020-09-26 DIAGNOSIS — Z888 Allergy status to other drugs, medicaments and biological substances status: Secondary | ICD-10-CM

## 2020-09-26 DIAGNOSIS — Z7902 Long term (current) use of antithrombotics/antiplatelets: Secondary | ICD-10-CM | POA: Diagnosis not present

## 2020-09-26 DIAGNOSIS — G9389 Other specified disorders of brain: Secondary | ICD-10-CM | POA: Diagnosis not present

## 2020-09-26 DIAGNOSIS — Z79899 Other long term (current) drug therapy: Secondary | ICD-10-CM | POA: Diagnosis not present

## 2020-09-26 DIAGNOSIS — Z886 Allergy status to analgesic agent status: Secondary | ICD-10-CM | POA: Diagnosis not present

## 2020-09-26 DIAGNOSIS — U071 COVID-19: Secondary | ICD-10-CM | POA: Diagnosis present

## 2020-09-26 DIAGNOSIS — I5021 Acute systolic (congestive) heart failure: Secondary | ICD-10-CM | POA: Diagnosis present

## 2020-09-26 DIAGNOSIS — I252 Old myocardial infarction: Secondary | ICD-10-CM | POA: Diagnosis not present

## 2020-09-26 DIAGNOSIS — R531 Weakness: Secondary | ICD-10-CM | POA: Diagnosis not present

## 2020-09-26 DIAGNOSIS — Z7901 Long term (current) use of anticoagulants: Secondary | ICD-10-CM | POA: Diagnosis not present

## 2020-09-26 DIAGNOSIS — I110 Hypertensive heart disease with heart failure: Secondary | ICD-10-CM | POA: Diagnosis not present

## 2020-09-26 DIAGNOSIS — Z95828 Presence of other vascular implants and grafts: Secondary | ICD-10-CM

## 2020-09-26 DIAGNOSIS — Z885 Allergy status to narcotic agent status: Secondary | ICD-10-CM | POA: Diagnosis not present

## 2020-09-26 DIAGNOSIS — Z90710 Acquired absence of both cervix and uterus: Secondary | ICD-10-CM

## 2020-09-26 DIAGNOSIS — Z9079 Acquired absence of other genital organ(s): Secondary | ICD-10-CM

## 2020-09-26 DIAGNOSIS — R5383 Other fatigue: Secondary | ICD-10-CM | POA: Diagnosis not present

## 2020-09-26 DIAGNOSIS — R059 Cough, unspecified: Secondary | ICD-10-CM | POA: Diagnosis not present

## 2020-09-26 DIAGNOSIS — M1990 Unspecified osteoarthritis, unspecified site: Secondary | ICD-10-CM | POA: Diagnosis present

## 2020-09-26 DIAGNOSIS — I4892 Unspecified atrial flutter: Secondary | ICD-10-CM | POA: Diagnosis not present

## 2020-09-26 DIAGNOSIS — I272 Pulmonary hypertension, unspecified: Secondary | ICD-10-CM | POA: Diagnosis present

## 2020-09-26 DIAGNOSIS — R509 Fever, unspecified: Secondary | ICD-10-CM | POA: Diagnosis not present

## 2020-09-26 DIAGNOSIS — I4891 Unspecified atrial fibrillation: Secondary | ICD-10-CM | POA: Diagnosis present

## 2020-09-26 DIAGNOSIS — I429 Cardiomyopathy, unspecified: Secondary | ICD-10-CM | POA: Diagnosis present

## 2020-09-26 DIAGNOSIS — Z90722 Acquired absence of ovaries, bilateral: Secondary | ICD-10-CM

## 2020-09-26 DIAGNOSIS — Z452 Encounter for adjustment and management of vascular access device: Secondary | ICD-10-CM | POA: Diagnosis not present

## 2020-09-26 DIAGNOSIS — R001 Bradycardia, unspecified: Secondary | ICD-10-CM | POA: Diagnosis not present

## 2020-09-26 DIAGNOSIS — R464 Slowness and poor responsiveness: Secondary | ICD-10-CM | POA: Diagnosis not present

## 2020-09-26 LAB — CBC WITH DIFFERENTIAL/PLATELET
Abs Immature Granulocytes: 0.03 10*3/uL (ref 0.00–0.07)
Basophils Absolute: 0 10*3/uL (ref 0.0–0.1)
Basophils Relative: 0 %
Eosinophils Absolute: 0 10*3/uL (ref 0.0–0.5)
Eosinophils Relative: 0 %
HCT: 39.8 % (ref 36.0–46.0)
Hemoglobin: 12.8 g/dL (ref 12.0–15.0)
Immature Granulocytes: 0 %
Lymphocytes Relative: 13 %
Lymphs Abs: 1.1 10*3/uL (ref 0.7–4.0)
MCH: 30.3 pg (ref 26.0–34.0)
MCHC: 32.2 g/dL (ref 30.0–36.0)
MCV: 94.3 fL (ref 80.0–100.0)
Monocytes Absolute: 0.8 10*3/uL (ref 0.1–1.0)
Monocytes Relative: 10 %
Neutro Abs: 6.4 10*3/uL (ref 1.7–7.7)
Neutrophils Relative %: 77 %
Platelets: 166 10*3/uL (ref 150–400)
RBC: 4.22 MIL/uL (ref 3.87–5.11)
RDW: 14.2 % (ref 11.5–15.5)
WBC: 8.3 10*3/uL (ref 4.0–10.5)
nRBC: 0 % (ref 0.0–0.2)

## 2020-09-26 LAB — COMPREHENSIVE METABOLIC PANEL
ALT: 18 U/L (ref 0–44)
AST: 25 U/L (ref 15–41)
Albumin: 3.9 g/dL (ref 3.5–5.0)
Alkaline Phosphatase: 54 U/L (ref 38–126)
Anion gap: 10 (ref 5–15)
BUN: 14 mg/dL (ref 8–23)
CO2: 24 mmol/L (ref 22–32)
Calcium: 9.6 mg/dL (ref 8.9–10.3)
Chloride: 100 mmol/L (ref 98–111)
Creatinine, Ser: 0.91 mg/dL (ref 0.44–1.00)
GFR, Estimated: 60 mL/min — ABNORMAL LOW (ref >60)
Glucose, Bld: 99 mg/dL (ref 70–99)
Potassium: 3.8 mmol/L (ref 3.5–5.1)
Sodium: 134 mmol/L — ABNORMAL LOW (ref 135–145)
Total Bilirubin: 1.2 mg/dL (ref 0.3–1.2)
Total Protein: 7.2 g/dL (ref 6.5–8.1)

## 2020-09-26 NOTE — Progress Notes (Signed)
    Subjective:    Patient ID: [name], female    DOB: [birth year], 87 y.o.   MRN: [id number]   HPI: [name] is a 87 y.o. female presenting for weak, likely dehydrated. Concerned for a stroke. Daughter, [name], spoke to me and said she has called 911 and the EMS is on their way as of 5 minutes before my call. . Concerned for another stroke. Couldn't make it to the bathroom last night. Wet herself. [name] says that is unusual. She is drinking ensure today as well as small amounts of tea and water. One bottle of. Ensure consumed today.    Depression screen [facility] 2/9 08/02/2020 06/12/2020 05/08/2020 04/26/2020 10/04/2019  Decreased Interest 0 0 0 0 0  Down, Depressed, Hopeless 0 0 0 0 0  PHQ - 2 Score 0 0 0 0 0     Relevant past medical, surgical, family and social history reviewed and updated as indicated.  Interim medical history since our last visit reviewed. Allergies and medications reviewed and updated.  ROS:  Review of Systems  Constitutional:  Positive for activity change, appetite change and fatigue. Negative for fever.  HENT: Negative.    Cardiovascular:  Negative for chest pain and leg swelling.  Neurological:  Positive for weakness (nonfocal).    Social History   Tobacco Use  Smoking Status Never  Smokeless Tobacco Never       Objective:     Wt Readings from Last 3 Encounters:  08/02/20 98 lb 9.6 oz (44.7 kg)  07/30/20 98 lb (44.5 kg)  07/09/20 99 lb 6.4 oz (45.1 kg)     Exam deferred. Pt. Harboring due to COVID 19. Phone visit performed.   Assessment & Plan:   1. Prostration     No orders of the defined types were placed in this encounter.   No orders of the defined types were placed in this encounter.     Diagnoses and all orders for this visit:  Prostration   Virtual Visit via telephone Note  I discussed the limitations, risks, security and privacy concerns of performing an evaluation and management service by telephone and the availability  of in person appointments. The patient was identified with two identifiers. Pt.expressed understanding and agreed to proceed. Pt. Is at home. Dr. Livia Snellen is in his office.  Follow Up Instructions:   I discussed the assessment and treatment plan with the patient. The patient was provided an opportunity to ask questions and all were answered. The patient agreed with the plan and demonstrated an understanding of the instructions.   The patient was advised to call back or seek an in-person evaluation if the symptoms worsen or if the condition fails to improve as anticipated.   Total minutes including chart review and phone contact time: 13   Follow up plan: Return if symptoms worsen or fail to improve.  [name], MD [place]

## 2020-09-26 NOTE — ED Triage Notes (Signed)
Brought in by [facility] fromh ome c/o generalized body weakness x today - daughter at bedside reports pt hasnt been drinking a lot of water. Denies any nausea, vomiting, fever, and shortness of breath.   [id number]. Not in resp distress.   Hx of TIA on Jan 2022.

## 2020-09-26 NOTE — ED Provider Notes (Signed)
Emergency Medicine Provider Triage Evaluation Note  [name] , a 87 y.o. female  was evaluated in triage.  Pt complains of generalized weakness beginning last night.  Accompanied by daughter.  Suspects may be dehydration.  Review of Systems  Positive: Generalized weakness, fatigue Negative: Abdominal pain, vomiting, diarrhea, fever  Physical Exam  BP (!) 154/109 (BP Location: Right Arm)   Pulse 86   Temp 100.2 F (37.9 C) (Oral)   Resp 16   SpO2 98%  Gen:   Awake, no distress   Resp:  Normal effort  MSK:   Moves extremities without difficulty  Other:    Medical Decision Making  Medically screening exam initiated at 7:58 PM.  Appropriate orders placed.  [name] was informed that the remainder of the evaluation will be completed by another provider, this initial triage assessment does not replace that evaluation, and the importance of remaining in the ED until their evaluation is complete.     [name] 09/26/20 2033    Drenda Freeze, MD 09/26/20 2141

## 2020-09-26 NOTE — ED Notes (Signed)
ED Provider at bedside. 

## 2020-09-27 ENCOUNTER — Telehealth: Payer: Self-pay | Admitting: Cardiovascular Disease

## 2020-09-27 DIAGNOSIS — I4891 Unspecified atrial fibrillation: Secondary | ICD-10-CM | POA: Diagnosis present

## 2020-09-27 DIAGNOSIS — R509 Fever, unspecified: Secondary | ICD-10-CM | POA: Insufficient documentation

## 2020-09-27 DIAGNOSIS — I480 Paroxysmal atrial fibrillation: Secondary | ICD-10-CM | POA: Diagnosis not present

## 2020-09-27 DIAGNOSIS — R002 Palpitations: Secondary | ICD-10-CM | POA: Insufficient documentation

## 2020-09-27 LAB — URINALYSIS, ROUTINE W REFLEX MICROSCOPIC
Bacteria, UA: NONE SEEN
Bilirubin Urine: NEGATIVE
Glucose, UA: NEGATIVE mg/dL
Ketones, ur: NEGATIVE mg/dL
Leukocytes,Ua: NEGATIVE
Nitrite: NEGATIVE
Protein, ur: NEGATIVE mg/dL
Specific Gravity, Urine: 1.005 (ref 1.005–1.030)
pH: 6 (ref 5.0–8.0)

## 2020-09-27 LAB — TSH: TSH: 3.945 u[IU]/mL (ref 0.350–4.500)

## 2020-09-27 MED ORDER — ATORVASTATIN CALCIUM 10 MG PO TABS
10.0000 mg | ORAL_TABLET | Freq: Every day | ORAL | Status: DC
  Administered 2020-09-28 – 2020-10-04 (×7): 10 mg via ORAL
  Filled 2020-09-27 (×7): qty 1

## 2020-09-27 MED ORDER — ACETAMINOPHEN 325 MG PO TABS: 650.0000 mg | ORAL_TABLET | Freq: Four times a day (QID) | ORAL | Status: DC | PRN

## 2020-09-27 MED ORDER — METOPROLOL SUCCINATE ER 25 MG PO TB24
12.5000 mg | ORAL_TABLET | Freq: Every day | ORAL | Status: DC
  Administered 2020-09-28: 12.5 mg via ORAL
  Filled 2020-09-27: qty 1

## 2020-09-27 MED ORDER — DONEPEZIL HCL 5 MG PO TABS
5.0000 mg | ORAL_TABLET | Freq: Every day | ORAL | Status: DC
  Administered 2020-09-28 – 2020-10-03 (×7): 5 mg via ORAL
  Filled 2020-09-27 (×8): qty 1

## 2020-09-27 MED ORDER — LORAZEPAM 2 MG/ML IJ SOLN: 0.2500 mg | Freq: Once | INTRAMUSCULAR | Status: DC

## 2020-09-27 MED ORDER — LOSARTAN POTASSIUM 25 MG PO TABS
25.0000 mg | ORAL_TABLET | Freq: Every day | ORAL | Status: DC
  Administered 2020-09-28 – 2020-10-04 (×7): 25 mg via ORAL
  Filled 2020-09-27 (×7): qty 1

## 2020-09-27 MED ORDER — SODIUM CHLORIDE 0.9 % IV BOLUS
500.0000 mL | Freq: Once | INTRAVENOUS | Status: AC
  Administered 2020-09-27: 500 mL via INTRAVENOUS

## 2020-09-27 MED ORDER — FOLIC ACID 1 MG PO TABS
1.0000 mg | ORAL_TABLET | Freq: Every day | ORAL | Status: DC
  Administered 2020-09-28 – 2020-10-04 (×7): 1 mg via ORAL
  Filled 2020-09-27 (×7): qty 1

## 2020-09-27 MED ORDER — ACETAMINOPHEN 650 MG RE SUPP: 650.0000 mg | Freq: Four times a day (QID) | RECTAL | Status: DC | PRN

## 2020-09-27 MED ORDER — DILTIAZEM LOAD VIA INFUSION
10.0000 mg | Freq: Once | INTRAVENOUS | Status: AC
  Administered 2020-09-27: 10 mg via INTRAVENOUS
  Filled 2020-09-27: qty 10

## 2020-09-27 MED ORDER — DILTIAZEM HCL-DEXTROSE 125-5 MG/125ML-% IV SOLN (PREMIX)
5.0000 mg/h | INTRAVENOUS | Status: DC
  Administered 2020-09-27: 5 mg/h via INTRAVENOUS
  Administered 2020-09-28: 10 mg/h via INTRAVENOUS
  Filled 2020-09-27 (×2): qty 125

## 2020-09-27 MED ORDER — METOPROLOL TARTRATE 5 MG/5ML IV SOLN
2.5000 mg | Freq: Once | INTRAVENOUS | Status: AC
  Administered 2020-09-27: 2.5 mg via INTRAVENOUS

## 2020-09-27 MED ORDER — METOPROLOL TARTRATE 5 MG/5ML IV SOLN
2.5000 mg | Freq: Once | INTRAVENOUS | Status: AC
  Administered 2020-09-27: 2.5 mg via INTRAVENOUS
  Filled 2020-09-27: qty 5

## 2020-09-27 MED ORDER — ASPIRIN EC 81 MG PO TBEC
81.0000 mg | DELAYED_RELEASE_TABLET | Freq: Every day | ORAL | Status: DC
  Administered 2020-09-28 – 2020-09-30 (×3): 81 mg via ORAL
  Filled 2020-09-27 (×3): qty 1

## 2020-09-27 MED ORDER — HEPARIN (PORCINE) 25000 UT/250ML-% IV SOLN
600.0000 [IU]/h | INTRAVENOUS | Status: DC
  Administered 2020-09-28: 700 [IU]/h via INTRAVENOUS
  Filled 2020-09-27: qty 250

## 2020-09-27 MED ORDER — HEPARIN BOLUS VIA INFUSION
2500.0000 [IU] | Freq: Once | INTRAVENOUS | Status: AC
  Administered 2020-09-28: 2500 [IU] via INTRAVENOUS
  Filled 2020-09-27: qty 2500

## 2020-09-27 MED ORDER — MEMANTINE HCL 10 MG PO TABS
10.0000 mg | ORAL_TABLET | Freq: Two times a day (BID) | ORAL | Status: DC
  Administered 2020-09-28 – 2020-10-04 (×14): 10 mg via ORAL
  Filled 2020-09-27 (×15): qty 1

## 2020-09-27 NOTE — H&P (Signed)
History and Physical    [name]:[id number] DOB: Jun 17, 1928 DOA: 09/26/2020  PCP: [name], MD  Patient coming from: Home.  Chief Complaint: Weakness.  HPI: [name] is a 87 y.o. female with history of hypertension, paroxysmal atrial fibrillation, mitral valve repair, history of stroke experienced some slurred speech yesterday morning on October 04, 2020 when she woke up at 10 AM.  Symptoms lasted for 5 minutes.  Following which patient felt weak and later in the evening had a fever 101 F at this point patient decided come to the ER.  While waiting in the ER patient started experiencing palpitation.  ED Course: On the monitor patient was showing a flutter/fibrillation with RVR was started on Cardizem infusion and heparin cardiology was consulted.  Patient had temperature 100.2 initially.  Later was afebrile.  Chest x-ray UA was unremarkable COVID test is pending.  Patient admitted for A. fib with RVR.  Labs show normal CBC sodium 134.  TSH is 3.945.  Review of Systems: As per HPI, rest all negative.   Past Medical History:  Diagnosis Date   Arthritis    Atrial fibrillation ([place])    postoperative   COVID-19    CVA (cerebral vascular accident) ([place])    HTN (hypertension)    MR (mitral regurgitation)    severe   NSTEMI (non-ST elevated myocardial infarction) ([place])    Pericarditis    postoperative    Past Surgical History:  Procedure Laterality Date   ANTERIOR AND POSTERIOR VAGINAL REPAIR  10/15/04   Dr. Nori Riis    anterior repair with perigee graft  04/16/06   posterior repair with apogee graft; lynx mid urethral sling; sacrospinous ligamnet suspension and cystoscopy; Dr. Joya Martyr   MITRAL VALVE REPLACEMENT     sacral spinous ligament suspension of vagina     suprapubic cystectomy     TOTAL ABDOMINAL HYSTERECTOMY W/ BILATERAL SALPINGOOPHORECTOMY       reports that she has never smoked. She has never used smokeless tobacco. She reports that she does not drink alcohol  and does not use drugs.  Allergies  Allergen Reactions   Actonel [Risedronate Sodium] Nausea And Vomiting   Aspirin-Caffeine     Upset stomach    Codeine     REACTION: upset stomach   Sulfonamide Derivatives     REACTION: swollen tongue    Family History  Problem Relation Age of Onset   Heart disease Father    Lupus Father    Heart attack Father    Heart disease Mother    Colon cancer Brother    Heart disease Brother     Prior to Admission medications   Medication Sig Start Date End Date Taking? Authorizing Provider  aspirin EC 81 MG tablet Take 81 mg by mouth daily. Swallow whole.   Yes [provider]  atorvastatin (LIPITOR) 10 MG tablet Take 1 tablet (10 mg total) by mouth daily. 04/18/20  Yes Josue Hector, MD  Biotin 1000 MCG tablet Take 1,000 mcg by mouth daily.   Yes [provider]  Cholecalciferol (VITAMIN D3) 10000 UNITS capsule Take 10,000 Units by mouth daily.     Yes [provider]  donepezil (ARICEPT ODT) 5 MG disintegrating tablet DISSOLVE 1 TABLET BY MOUTH AT BEDTIME Patient taking differently: Take 5 mg by mouth at bedtime. 08/20/20  Yes Stacks, Cletus Gash, MD  folic acid (FOLVITE) 1 MG tablet Take 1 tablet (1 mg total) by mouth daily. 07/13/20 07/13/21 Yes Garvin Fila, MD  losartan (COZAAR) 50 MG tablet Take 25 mg by mouth daily.   Yes [provider]  memantine (NAMENDA) 10 MG tablet Take 1 tablet (10 mg total) by mouth 2 (two) times daily. Start this only after finishing titration pack 07/09/20  Yes Garvin Fila, MD  metoprolol succinate (TOPROL-XL) 25 MG 24 hr tablet Take 0.5 tablets (12.5 mg total) by mouth daily. 04/18/20  Yes Josue Hector, MD  amoxicillin (AMOXIL) 500 MG tablet Take 500 mg by mouth See admin instructions. Take four tablets 1 hour before dental procedures    [provider]    Physical Exam: Constitutional: Moderately built and nourished. Vitals:   09/27/20 2015 09/27/20 2030 09/27/20 2045  09/27/20 2101  BP: 129/84 117/79 131/84   Pulse: (!) 115 (!) 120 (!) 119   Resp: (!) 23 (!) 23 (!) 24   Temp:      TempSrc:      SpO2: 97% 97% 96%   Weight:    44.5 kg  Height:    5\' 3"  (1.6 m)   Eyes: Anicteric no pallor. ENMT: No discharge from the ears eyes nose and mouth. Neck: No mass felt.  No neck rigidity. Respiratory: No rhonchi or crepitations. Cardiovascular: S1-S2 heard. Abdomen: Soft nontender bowel sound present. Musculoskeletal: No edema. Skin: No rash. Neurologic: Alert awake oriented to time place and person.  Moves all extremities. Psychiatric: Appears normal.  Normal affect.   Labs on Admission: I have personally reviewed following labs and imaging studies  CBC: Recent Labs  Lab 09/26/20 2024  WBC 8.3  NEUTROABS 6.4  HGB 12.8  HCT 39.8  MCV 94.3  PLT 553   Basic Metabolic Panel: Recent Labs  Lab 09/26/20 2024  NA 134*  K 3.8  CL 100  CO2 24  GLUCOSE 99  BUN 14  CREATININE 0.91  CALCIUM 9.6   GFR: Estimated Creatinine Clearance: 28.9 mL/min (by C-G formula based on SCr of 0.91 mg/dL). Liver Function Tests: Recent Labs  Lab 09/26/20 2024  AST 25  ALT 18  ALKPHOS 54  BILITOT 1.2  PROT 7.2  ALBUMIN 3.9   No results for input(s): LIPASE, AMYLASE in the last 168 hours. No results for input(s): AMMONIA in the last 168 hours. Coagulation Profile: No results for input(s): INR, PROTIME in the last 168 hours. Cardiac Enzymes: No results for input(s): CKTOTAL, CKMB, CKMBINDEX, TROPONINI in the last 168 hours. BNP (last 3 results) No results for input(s): PROBNP in the last 8760 hours. HbA1C: No results for input(s): HGBA1C in the last 72 hours. CBG: No results for input(s): GLUCAP in the last 168 hours. Lipid Profile: No results for input(s): CHOL, HDL, LDLCALC, TRIG, CHOLHDL, LDLDIRECT in the last 72 hours. Thyroid Function Tests: No results for input(s): TSH, T4TOTAL, FREET4, T3FREE, THYROIDAB in the last 72 hours. Anemia  Panel: No results for input(s): VITAMINB12, FOLATE, FERRITIN, TIBC, IRON, RETICCTPCT in the last 72 hours. Urine analysis:    Component Value Date/Time   COLORURINE YELLOW 09/26/2020 2354   APPEARANCEUR CLEAR 09/26/2020 2354   LABSPEC 1.005 09/26/2020 2354   PHURINE 6.0 09/26/2020 2354   GLUCOSEU NEGATIVE 09/26/2020 2354   HGBUR SMALL (A) 09/26/2020 2354   BILIRUBINUR NEGATIVE 09/26/2020 2354   BILIRUBINUR negative 08/09/2012 1203   KETONESUR NEGATIVE 09/26/2020 2354   PROTEINUR NEGATIVE 09/26/2020 2354   UROBILINOGEN negative 08/09/2012 1203   NITRITE NEGATIVE 09/26/2020 2354   LEUKOCYTESUR NEGATIVE 09/26/2020 2354   Sepsis Labs: @LABRCNTIP (procalcitonin:4,lacticidven:4) )No results found for this  or any previous visit (from the past 240 hour(s)).   Radiological Exams on Admission: DG Chest 2 View  Result Date: 09/26/2020 CLINICAL DATA:  Generalized weakness, loss of appetite, cough EXAM: CHEST - 2 VIEW COMPARISON:  08/22/2013 FINDINGS: Frontal and lateral views of the chest demonstrate a stable cardiac silhouette. Mitral valve prosthesis is identified. Calcified mediastinal lymph nodes are again identified consistent with previous granulomatous disease. There is a calcified granuloma in the left apex. No airspace disease, effusion, or pneumothorax. No acute bony abnormalities. IMPRESSION: 1. No acute intrathoracic process. Electronically Signed   By: Randa Ngo M.D.   On: 09/26/2020 20:33    EKG: Independently reviewed.  A. fib/flutter RVR.  Assessment/Plan Active Problems:   Atrial fibrillation with RVR (HCC)    A. fib/flutter with RVR presently on Cardizem infusion.  Patient's CHA2DS2-VASc score is at least 4.  Patient on heparin infusion.  TSH is normal.  Check 2D echo, check cardiac markers.  Cardiology has been consulted.  Once patient takes has a regular beta-blocker we will try to wean off the Cardizem. Slurred speech lasted for few minutes yesterday.  Presently  asymptomatic appears nonfocal.  Will check MRI.  Presently on Lipitor and aspirin for previous strokes. Fever -had brief episode of fever at home and in the ER.  Chest x-ray and UA were unremarkable.  COVID test is pending. Hypertension on ARB and beta-blockers. History of mitral valve repair follow 2D echo.  COVID test is pending.   DVT prophylaxis: Heparin infusion. Code Status: Full code. Family Communication: Patient's daughter. Disposition Plan: Home. Consults called: Cardiology. Admission status: Observation.   Rise Patience MD [facility] Pager [phone].  If 7PM-7AM, please contact night-coverage [email or web address] Password [facility]  09/27/2020, 9:45 PM

## 2020-09-27 NOTE — ED Provider Notes (Signed)
[facility] EMERGENCY DEPARTMENT Provider Note   CSN: [id number] Arrival date & time: 09/26/20  1902     History Chief Complaint  Patient presents with   Weakness    [name] is a 87 y.o. female.  She is brought in by her daughter for evaluation of weakness that started yesterday.  She said it happened acutely after breakfast.  She unfortunately is been in the waiting room since yesterday afternoon.  She was late on taking her medications.  She has not eaten or drank much.  She said she feels a little bit better and has been able to ambulate to the bathroom.  Has some chronic urinary frequency.  The history is provided by the patient and a relative.  Weakness Severity:  Moderate Onset quality:  Sudden Duration:  3 hours Timing:  Constant Progression:  Partially resolved Chronicity:  New Relieved by:  None tried Worsened by:  Activity Ineffective treatments:  Drinking fluids Associated symptoms: frequency   Associated symptoms: no abdominal pain, no chest pain, no cough, no diarrhea, no falls, no fever, no foul-smelling urine, no headaches, no loss of consciousness, no nausea, no sensory-motor deficit, no shortness of breath, no stroke symptoms and no syncope       Past Medical History:  Diagnosis Date   Arthritis    Atrial fibrillation (HCC)    postoperative   COVID-19    CVA (cerebral vascular accident) ([place])    HTN (hypertension)    MR (mitral regurgitation)    severe   NSTEMI (non-ST elevated myocardial infarction) ([place])    Pericarditis    postoperative    Patient Active Problem List   Diagnosis Date Noted   TIA (transient ischemic attack) 08/08/2020   Senile dementia without behavioral disturbance ([place]) 07/09/2020   Paroxysmal atrial fibrillation ([place]) 04/09/2020   Cardiomyopathy ([place]) 04/09/2020   Iron deficiency anemia due to chronic blood loss 01/12/2017   Hypothyroidism 01/06/2017   RBBB 10/12/2013   Varicose veins 10/12/2013    Fatigue 11/06/2011   Orthostatic hypotension 11/06/2011   Mitral valve failure 10/09/2010   PULMONARY HYPERTENSION, MILD 09/26/2009   DIVERTICULOSIS, COLON 09/26/2009   TRANSIENT ISCHEMIC ATTACKS, HX OF 09/26/2009   COLONIC POLYPS, ADENOMATOUS, HX OF 09/26/2009   ISCHEMIC COLITIS, HX OF 09/26/2009   Essential hypertension 08/26/2008    Past Surgical History:  Procedure Laterality Date   ANTERIOR AND POSTERIOR VAGINAL REPAIR  10/15/04   Dr. Nori Riis    anterior repair with perigee graft  04/16/06   posterior repair with apogee graft; lynx mid urethral sling; sacrospinous ligamnet suspension and cystoscopy; Dr. Joya Martyr   MITRAL VALVE REPLACEMENT     sacral spinous ligament suspension of vagina     suprapubic cystectomy     TOTAL ABDOMINAL HYSTERECTOMY W/ BILATERAL SALPINGOOPHORECTOMY       OB History   No obstetric history on file.     Family History  Problem Relation Age of Onset   Heart disease Father    Lupus Father    Heart attack Father    Heart disease Mother    Colon cancer Brother    Heart disease Brother     Social History   Tobacco Use   Smoking status: Never   Smokeless tobacco: Never  Vaping Use   Vaping Use: Never used  Substance Use Topics   Alcohol use: No   Drug use: No    Home Medications Prior to Admission medications   Medication Sig Start Date End  Date Taking? Authorizing Provider  amoxicillin (AMOXIL) 500 MG tablet Take 500 mg by mouth. Take four tablets 1 hour before dental procedures    [provider]  atorvastatin (LIPITOR) 10 MG tablet Take 1 tablet (10 mg total) by mouth daily. 04/18/20   Josue Hector, MD  Biotin 1000 MCG tablet Take 1,000 mcg by mouth daily.    [provider]  Cholecalciferol (VITAMIN D3) 10000 UNITS capsule Take 10,000 Units by mouth daily.      [provider]  clopidogrel (PLAVIX) 75 MG tablet Take 1 tablet (75 mg total) by mouth daily. 08/02/20   [name], MD  donepezil (ARICEPT ODT) 5  MG disintegrating tablet DISSOLVE 1 TABLET BY MOUTH AT BEDTIME 08/20/20   [name], MD  folic acid (FOLVITE) 1 MG tablet Take 1 tablet (1 mg total) by mouth daily. 07/13/20 07/13/21  Garvin Fila, MD  losartan (COZAAR) 25 MG tablet Take 1 tablet (25 mg total) by mouth daily. 05/03/20   Josue Hector, MD  memantine [facility] TITRATION PAK) tablet pack 5 mg/day for =1 week; 5 mg twice daily for =1 week; 15 mg/day given in 5 mg and 10 mg separated doses for =1 week; then 10 mg twice daily 07/09/20   Garvin Fila, MD  memantine (NAMENDA) 10 MG tablet Take 1 tablet (10 mg total) by mouth 2 (two) times daily. Start this only after finishing titration pack 07/09/20   Garvin Fila, MD  metoprolol succinate (TOPROL-XL) 25 MG 24 hr tablet Take 0.5 tablets (12.5 mg total) by mouth daily. 04/18/20   Josue Hector, MD    Allergies    Actonel [risedronate sodium], Aspirin-caffeine, Codeine, and Sulfonamide derivatives  Review of Systems   Review of Systems  Constitutional:  Negative for fever.  HENT:  Negative for sore throat.   Eyes:  Negative for visual disturbance.  Respiratory:  Negative for cough and shortness of breath.   Cardiovascular:  Negative for chest pain and syncope.  Gastrointestinal:  Negative for abdominal pain, diarrhea and nausea.  Genitourinary:  Positive for frequency.  Musculoskeletal:  Negative for falls and neck pain.  Skin:  Negative for rash.  Neurological:  Positive for weakness. Negative for loss of consciousness and headaches.   Physical Exam Updated Vital Signs BP 107/84 (BP Location: Left Arm)   Pulse (!) 139   Temp 98.6 F (37 C)   Resp 18   SpO2 100%   Physical Exam Vitals and nursing note reviewed.  Constitutional:      General: She is not in acute distress.    Appearance: Normal appearance. She is well-developed.  HENT:     Head: Normocephalic and atraumatic.  Eyes:     Conjunctiva/sclera: Conjunctivae normal.  Cardiovascular:     Rate and Rhythm:  Tachycardia present. Rhythm irregular.     Heart sounds: No murmur heard. Pulmonary:     Effort: Pulmonary effort is normal. No respiratory distress.     Breath sounds: Normal breath sounds.  Abdominal:     Palpations: Abdomen is soft.     Tenderness: There is no abdominal tenderness.  Musculoskeletal:        General: Normal range of motion.     Cervical back: Neck supple.     Right lower leg: No edema.     Left lower leg: No edema.  Skin:    General: Skin is warm and dry.  Neurological:     General: No focal deficit present.  Mental Status: She is alert and oriented to person, place, and time.     Cranial Nerves: No cranial nerve deficit.     Sensory: No sensory deficit.     Motor: No weakness.    ED Results / Procedures / Treatments   Labs (all labs ordered are listed, but only abnormal results are displayed) Labs Reviewed  RESP PANEL BY RT-PCR (FLU A&B, COVID) ARPGX2 - Abnormal; Notable for the following components:      Result Value   SARS Coronavirus 2 by RT PCR POSITIVE (*)    All other components within normal limits  COMPREHENSIVE METABOLIC PANEL - Abnormal; Notable for the following components:   Sodium 134 (*)    GFR, Estimated 60 (*)    All other components within normal limits  URINALYSIS, ROUTINE W REFLEX MICROSCOPIC - Abnormal; Notable for the following components:   Hgb urine dipstick SMALL (*)    All other components within normal limits  BRAIN NATRIURETIC PEPTIDE - Abnormal; Notable for the following components:   B Natriuretic Peptide 327.7 (*)    All other components within normal limits  C-REACTIVE PROTEIN - Abnormal; Notable for the following components:   CRP 6.5 (*)    All other components within normal limits  COMPREHENSIVE METABOLIC PANEL - Abnormal; Notable for the following components:   Glucose, Bld 105 (*)    Total Protein 6.4 (*)    GFR, Estimated 60 (*)    All other components within normal limits  D-DIMER, QUANTITATIVE - Abnormal;  Notable for the following components:   D-Dimer, Quant 0.59 (*)    All other components within normal limits  HEPARIN LEVEL (UNFRACTIONATED) - Abnormal; Notable for the following components:   Heparin Unfractionated 0.76 (*)    All other components within normal limits  CBC WITH DIFFERENTIAL/PLATELET  TSH  HEPARIN LEVEL (UNFRACTIONATED)  CBC WITH DIFFERENTIAL/PLATELET  FERRITIN  CBC WITH DIFFERENTIAL/PLATELET  COMPREHENSIVE METABOLIC PANEL  C-REACTIVE PROTEIN  D-DIMER, QUANTITATIVE  FERRITIN    EKG EKG Interpretation  Date/Time:  Thursday September 27 2020 20:48:02 EDT Ventricular Rate:  118 PR Interval:  119 QRS Duration: 126 QT Interval:  350 QTC Calculation: 491 R Axis:   -76 Text Interpretation: Sinus tachycardia vs flutter Right bundle branch block LVH with IVCD and secondary repol abnrm Borderline prolonged QT interval Confirmed by Aletta Edouard [phone]) on 09/27/2020 9:02:47 PM Also confirmed by Aletta Edouard [phone]), editor Mariam Dollar [phone])  on 09/28/2020 8:40:14 AM  Radiology DG Chest 2 View  Result Date: 09/26/2020 CLINICAL DATA:  Generalized weakness, loss of appetite, cough EXAM: CHEST - 2 VIEW COMPARISON:  08/22/2013 FINDINGS: Frontal and lateral views of the chest demonstrate a stable cardiac silhouette. Mitral valve prosthesis is identified. Calcified mediastinal lymph nodes are again identified consistent with previous granulomatous disease. There is a calcified granuloma in the left apex. No airspace disease, effusion, or pneumothorax. No acute bony abnormalities. IMPRESSION: 1. No acute intrathoracic process. Electronically Signed   By: Randa Ngo M.D.   On: 09/26/2020 20:33    Procedures .Critical Care  Date/Time: 09/28/2020 5:27 PM Performed by: Hayden Rasmussen, MD Authorized by: Hayden Rasmussen, MD   Critical care provider statement:    Critical care time (minutes):  45   Critical care time was exclusive of:  Separately billable procedures and  treating other patients   Critical care was necessary to treat or prevent imminent or life-threatening deterioration of the following conditions:  Cardiac failure   Critical care was  time spent personally by me on the following activities:  Discussions with consultants, evaluation of patient's response to treatment, examination of patient, ordering and performing treatments and interventions, ordering and review of laboratory studies, ordering and review of radiographic studies, pulse oximetry, re-evaluation of patient's condition, obtaining history from patient or surrogate, review of old charts and development of treatment plan with patient or surrogate   Medications Ordered in ED Medications  aspirin EC tablet 81 mg (81 mg Oral Given 09/28/20 0929)  atorvastatin (LIPITOR) tablet 10 mg (10 mg Oral Given 09/28/20 0929)  losartan (COZAAR) tablet 25 mg (25 mg Oral Given 09/28/20 0929)  metoprolol succinate (TOPROL-XL) 24 hr tablet 12.5 mg (12.5 mg Oral Given 09/28/20 0940)  donepezil (ARICEPT) tablet 5 mg (5 mg Oral Given 09/28/20 0206)  memantine (NAMENDA) tablet 10 mg (10 mg Oral Given 9/38/10 1751)  folic acid (FOLVITE) tablet 1 mg (1 mg Oral Given 09/28/20 0929)  acetaminophen (TYLENOL) tablet 650 mg (has no administration in time range)    Or  acetaminophen (TYLENOL) suppository 650 mg (has no administration in time range)  LORazepam (ATIVAN) injection 0.25 mg (has no administration in time range)  remdesivir 200 mg in sodium chloride 0.9% 250 mL IVPB (0 mg Intravenous Stopped 09/28/20 1217)    Followed by  remdesivir 100 mg in sodium chloride 0.9 % 100 mL IVPB (has no administration in time range)  guaiFENesin-dextromethorphan (ROBITUSSIN DM) 100-10 MG/5ML syrup 10 mL (has no administration in time range)  ascorbic acid (VITAMIN C) tablet 500 mg (500 mg Oral Given 09/28/20 0929)  zinc sulfate capsule 220 mg (220 mg Oral Given 09/28/20 0928)  enoxaparin (LOVENOX) injection 30 mg (has no  administration in time range)  sodium chloride 0.9 % bolus 500 mL (0 mLs Intravenous Stopped 09/27/20 2016)  metoprolol tartrate (LOPRESSOR) injection 2.5 mg (2.5 mg Intravenous Given 09/27/20 1839)  metoprolol tartrate (LOPRESSOR) injection 2.5 mg (2.5 mg Intravenous Given 09/27/20 1845)  diltiazem (CARDIZEM) 1 mg/mL load via infusion 10 mg (10 mg Intravenous Bolus from Bag 09/27/20 2016)  heparin bolus via infusion 2,500 Units (2,500 Units Intravenous Bolus from Bag 09/28/20 0004)    ED Course  I have reviewed the triage vital signs and the nursing notes.  Pertinent labs & imaging results that were available during my care of the patient were reviewed by me and considered in my medical decision making (see chart for details).  Clinical Course as of 09/28/20 1725  Thu Sep 27, 2020  0258 Patient has a remote history of A. fib during a surgical procedure and is not on anticoagulation.  She is here now with what looks sinus tachycardia alternating with some atrial fibrillation.  Tried some fluids and beta-blocker without much improvement so initiated a Cardizem drip. [MB]  2046 Discussed with cardiology Dr. Toney Rakes.  He will consult on the patient.  We reviewed some EKGs and he wonders if there may be a flutter underneath.  Recommending starting on some heparin for now. [MB]  2047 Discussed with [facility] Dr. Hal Hope who will evaluate the patient for admission. [MB]    Clinical Course User Index [MB] Hayden Rasmussen, MD   MDM Rules/Calculators/A&P                          This patient complains of generalized weakness; this involves an extensive number of treatment Options and is a complaint that carries with it a high risk of complications and Morbidity. The  differential includes COVID, pneumonia, CHF, COPD, arrhythmia, metabolic derangement, anemia, UTI  I ordered, reviewed and interpreted labs, which included CBC with normal white count normal hemoglobin, chemistries normal,  TSH normal I ordered medication IV beta-blocker and fluids, IV Cardizem drip I ordered imaging studies which included chest x-ray and I independently    visualized and interpreted imaging which showed no acute findings Additional history obtained from patient's daughter Previous records obtained and reviewed in epic, follows with Dr. Johnsie Cancel cardiology and has a remote history of A. fib I consulted cardiology Dr. Toney Rakes and [facility] hospital[facility] Dr. Hal Hope and discussed lab and imaging findings  Critical Interventions: Initiation of IV medications for patient's tachycardia  After the interventions stated above, I reevaluated the patient and found patient's heart rate to be improving slowly.  She will need to be admitted to the hospital for further work-up.   Final Clinical Impression(s) / ED Diagnoses Final diagnoses:  Generalized weakness  Atrial flutter, unspecified type [facility])    Rx / DC Orders ED Discharge Orders     None        Hayden Rasmussen, MD 09/28/20 1731

## 2020-09-27 NOTE — Progress Notes (Signed)
ANTICOAGULATION CONSULT NOTE - Initial Consult  Pharmacy Consult for heparin Indication: atrial fibrillation  Allergies  Allergen Reactions   Actonel [Risedronate Sodium] Nausea And Vomiting   Aspirin-Caffeine     Upset stomach    Codeine     REACTION: upset stomach   Sulfonamide Derivatives     REACTION: swollen tongue    Patient Measurements:   Heparin Dosing Weight: TBW  Vital Signs: Temp: 98.6 F (37 C) (07/21 0945) BP: 135/78 (07/21 2000) Pulse Rate: 116 (07/21 2000)  Labs: Recent Labs    09/26/20 2024  HGB 12.8  HCT 39.8  PLT 166  CREATININE 0.91    CrCl cannot be calculated (Unknown ideal weight.).   Medical History: Past Medical History:  Diagnosis Date   Arthritis    Atrial fibrillation ([place])    postoperative   COVID-19    CVA (cerebral vascular accident) ([place])    HTN (hypertension)    MR (mitral regurgitation)    severe   NSTEMI (non-ST elevated myocardial infarction) ([place])    Pericarditis    postoperative    Assessment: 47 YOF presenting with weakness, found to be in afib, she has hx of pAfib and has been on Xarelto in past however is currently not on anticoagulation.  CBC wnl  Goal of Therapy:  Heparin level 0.3-0.7 units/ml Monitor platelets by anticoagulation protocol: Yes   Plan:  Heparin 2500 units IV x 1, and gtt at 700 units/hr F/u 8 hour heparin level with AM labs F/u long term [facility] [place] plan  Bertis Ruddy, PharmD Clinical Pharmacist ED Pharmacist Phone # [phone] 09/27/2020 9:02 PM

## 2020-09-27 NOTE — Telephone Encounter (Signed)
PT is in the ER would like for someone to contact the hospital directly and see if we can find something ok?

## 2020-09-27 NOTE — Telephone Encounter (Signed)
Answered all questions able from out patient perspective and advised the patient's daughter to contact the inpatient nurse for further assistance.

## 2020-09-27 NOTE — Consult Note (Signed)
Cardiology Consultation:   Patient ID: [name] MRN: [id number]; DOB: 1928/12/19  Admit date: 09/26/2020 Date of Consult: 09/27/2020  PCP:  [name], MD   [facility] Providers Cardiologist:  None        Patient Profile:   [name] is a 87 y.o. female with a hx of HTN, HLD, P. Afib -not on AC (remained in NSR), h/o MV repair, Dementia who is being seen 09/27/2020 for the evaluation of tachycardia (likley aflutter 2:1/ afib) at the request of Dr Hal Hope.  History of Present Illness:   [name] is a 87 y.o. female with a hx of HTN, HLD, P. Afib -not on AC (remained in NSR), h/o MV repair, Dementia who is being seen 09/27/2020 for the evaluation of tachycardia (likley aflutter 2:1/afib) at the request of Dr Hal Hope.  Patient was brought from home with c/o generalized weakness. She reports yesterday she suddenlty started feeling weak also noted palpitations. Denies any chest pain. No fevers, chills, n, v, abd pain, diarrhea or covid symptoms. Also per chart- she had slurred speech which resolved  She was in the ER since yesterday and this evening started to notice increased palpitations, tele monitoring shows HR upto 160s, irregular rhythm.  ER work up: ekgs shows RBBB, LAFB, 2:1 Aflutter K3.8, UA negative, CXR negative S/p IV metoprolol and started on IV diltiazem gtt 10mg /hr   Past Medical History:  Diagnosis Date   Arthritis    Atrial fibrillation (HCC)    postoperative   COVID-19    CVA (cerebral vascular accident) ([place])    HTN (hypertension)    MR (mitral regurgitation)    severe   NSTEMI (non-ST elevated myocardial infarction) ([place])    Pericarditis    postoperative    Past Surgical History:  Procedure Laterality Date   ANTERIOR AND POSTERIOR VAGINAL REPAIR  10/15/04   Dr. Nori Riis    anterior repair with perigee graft  04/16/06   posterior repair with apogee graft; lynx mid urethral sling; sacrospinous ligamnet suspension and cystoscopy; Dr.  Joya Martyr   MITRAL VALVE REPLACEMENT     sacral spinous ligament suspension of vagina     suprapubic cystectomy     TOTAL ABDOMINAL HYSTERECTOMY W/ BILATERAL SALPINGOOPHORECTOMY       Home Medications:  Prior to Admission medications   Medication Sig Start Date End Date Taking? Authorizing Provider  aspirin EC 81 MG tablet Take 81 mg by mouth daily. Swallow whole.   Yes [provider]  atorvastatin (LIPITOR) 10 MG tablet Take 1 tablet (10 mg total) by mouth daily. 04/18/20  Yes Josue Hector, MD  Biotin 1000 MCG tablet Take 1,000 mcg by mouth daily.   Yes [provider]  Cholecalciferol (VITAMIN D3) 10000 UNITS capsule Take 10,000 Units by mouth daily.     Yes [provider]  donepezil (ARICEPT ODT) 5 MG disintegrating tablet DISSOLVE 1 TABLET BY MOUTH AT BEDTIME Patient taking differently: Take 5 mg by mouth at bedtime. 08/20/20  Yes Stacks, Cletus Gash, MD  folic acid (FOLVITE) 1 MG tablet Take 1 tablet (1 mg total) by mouth daily. 07/13/20 07/13/21 Yes Garvin Fila, MD  losartan (COZAAR) 50 MG tablet Take 25 mg by mouth daily.   Yes [provider]  memantine (NAMENDA) 10 MG tablet Take 1 tablet (10 mg total) by mouth 2 (two) times daily. Start this only after finishing titration pack 07/09/20  Yes Garvin Fila, MD  metoprolol succinate (TOPROL-XL) 25 MG 24 hr  tablet Take 0.5 tablets (12.5 mg total) by mouth daily. 04/18/20  Yes Josue Hector, MD  amoxicillin (AMOXIL) 500 MG tablet Take 500 mg by mouth See admin instructions. Take four tablets 1 hour before dental procedures    [provider]    Inpatient Medications: Scheduled Meds:  heparin  2,500 Units Intravenous Once   Continuous Infusions:  diltiazem (CARDIZEM) infusion 10 mg/hr (09/27/20 2102)   heparin     PRN Meds:   Allergies:    Allergies  Allergen Reactions   Actonel [Risedronate Sodium] Nausea And Vomiting   Aspirin-Caffeine     Upset stomach    Codeine     REACTION:  upset stomach   Sulfonamide Derivatives     REACTION: swollen tongue    Social History:   Social History   Socioeconomic History   Marital status: Widowed    Spouse name: Not on file   Number of children: 3   Years of education: Not on file   Highest education level: 9th grade  Occupational History   Occupation: Retired  Tobacco Use   Smoking status: Never   Smokeless tobacco: Never  Vaping Use   Vaping Use: Never used  Substance and Sexual Activity   Alcohol use: No   Drug use: No   Sexual activity: Not on file  Other Topics Concern   Not on file  Social History Narrative   Lives with son   Right Handed    Drinks 2-3  cups caffeine daily   Social Determinants of Health   Financial Resource Strain: Low Risk    Difficulty of Paying Living Expenses: Not hard at all  Food Insecurity: No Food Insecurity   Worried About Charity fundraiser in the Last Year: Never true   Arboriculturist in the Last Year: Never true  Transportation Needs: No Transportation Needs   Lack of Transportation (Medical): No   Lack of Transportation (Non-Medical): No  Physical Activity: Not on file  Stress: Not on file  Social Connections: Moderately Integrated   Frequency of Communication with Friends and Family: More than three times a week   Frequency of Social Gatherings with Friends and Family: More than three times a week   Attends Religious Services: More than 4 times per year   Active Member of [employer] or Organizations: Yes   Attends Archivist Meetings: More than 4 times per year   Marital Status: Widowed  Human resources officer Violence: Not on file    Family History:   As noted below Family History  Problem Relation Age of Onset   Heart disease Father    Lupus Father    Heart attack Father    Heart disease Mother    Colon cancer Brother    Heart disease Brother      ROS:  Please see the history of present illness.  As noted above All other ROS reviewed and negative.      Physical Exam/Data:   Vitals:   09/27/20 2015 09/27/20 2030 09/27/20 2045 09/27/20 2101  BP: 129/84 117/79 131/84   Pulse: (!) 115 (!) 120 (!) 119   Resp: (!) 23 (!) 23 (!) 24   Temp:      TempSrc:      SpO2: 97% 97% 96%   Weight:    44.5 kg  Height:    5\' 3"  (1.6 m)    Intake/Output Summary (Last 24 hours) at 09/27/2020 2127 Last data filed at 09/27/2020 2102 Gross  per 24 hour  Intake 514.82 ml  Output --  Net 514.82 ml   Last 3 Weights 09/27/2020 08/02/2020 07/30/2020  Weight (lbs) 98 lb 98 lb 9.6 oz 98 lb  Weight (kg) 44.453 kg 44.725 kg 44.453 kg     Body mass index is 17.36 kg/m.  General:  Well nourished, well developed, in no acute distress HEENT: normal Lymph: no adenopathy Neck: no JVD Endocrine:  No thryomegaly Vascular: No carotid bruits; FA pulses 2+ bilaterally without bruits  Cardiac:  irregularly irregular; no murmur no JVD Lungs:  clear to auscultation bilaterally, no wheezing, rhonchi or rales  Abd: soft, nontender, no hepatomegaly  Ext: no edema Musculoskeletal:  No deformities, BUE and BLE strength normal and equal Skin: warm and dry  Neuro:  CNs 2-12 intact, no focal abnormalities noted Psych:  Normal affect    Laboratory Data:  High Sensitivity Troponin:  No results for input(s): TROPONINIHS in the last 720 hours.   Chemistry Recent Labs  Lab 09/26/20 2024  NA 134*  K 3.8  CL 100  CO2 24  GLUCOSE 99  BUN 14  CREATININE 0.91  CALCIUM 9.6  GFRNONAA 60*  ANIONGAP 10    Recent Labs  Lab 09/26/20 2024  PROT 7.2  ALBUMIN 3.9  AST 25  ALT 18  ALKPHOS 54  BILITOT 1.2   Hematology Recent Labs  Lab 09/26/20 2024  WBC 8.3  RBC 4.22  HGB 12.8  HCT 39.8  MCV 94.3  MCH 30.3  MCHC 32.2  RDW 14.2  PLT 166   BNPNo results for input(s): BNP, PROBNP in the last 168 hours.  DDimer No results for input(s): DDIMER in the last 168 hours.   Radiology/Studies:  DG Chest 2 View  Result Date: 09/26/2020 CLINICAL DATA:  Generalized  weakness, loss of appetite, cough EXAM: CHEST - 2 VIEW COMPARISON:  08/22/2013 FINDINGS: Frontal and lateral views of the chest demonstrate a stable cardiac silhouette. Mitral valve prosthesis is identified. Calcified mediastinal lymph nodes are again identified consistent with previous granulomatous disease. There is a calcified granuloma in the left apex. No airspace disease, effusion, or pneumothorax. No acute bony abnormalities. IMPRESSION: 1. No acute intrathoracic process. Electronically Signed   By: Randa Ngo M.D.   On: 09/26/2020 20:33    EKG:  The EKG was personally reviewed and demonstrates:  aflutter 2:1, RBBB, LAFB Telemetry:  Telemetry was personally reviewed and demonstrates:  aflutter  Relevant CV Studies: ECHO: 01/12/2019 IMPRESSIONS     1. Left ventricular ejection fraction, by visual estimation, is 60 to  65%. The left ventricle has normal function. There is no left ventricular  hypertrophy.   2. Left ventricular diastolic parameters are indeterminate.   3. Inferior basal hypokinesis.   4. Global right ventricle has normal systolic function.The right  ventricular size is normal. No increase in right ventricular wall  thickness.   5. Left atrial size was normal.   6. Right atrial size was mildly dilated.   7. The mitral valve has been repaired/replaced. Trace mitral valve  regurgitation. No evidence of mitral stenosis.   8. Post MV repair with trivial residual MR.   9. The tricuspid valve is normal in structure. Tricuspid valve  regurgitation is mild.  10. The aortic valve is tricuspid. Aortic valve regurgitation is mild.  Mild to moderate aortic valve sclerosis/calcification without any evidence  of aortic stenosis.  11. The pulmonic valve was normal in structure. Pulmonic valve  regurgitation is not visualized.  12. Mildly  elevated pulmonary artery systolic pressure.  13. The inferior vena cava is normal in size with greater than 50%  respiratory variability,  suggesting right atrial pressure of 3 mmHg.   Assessment and Plan:   Paroxysmal Afib/Aflutter 2:1 with RVR H/o P. AF (per chart review- not on AC) Fever 101F HTN, HLD Hypothyroidism H/o MV repair 2016 H/o dementia  Plan: -admit under hospitalist  - currently on IV diltiazem gtt 10mg /hr, HR is around 110-120s. Begin heparin gtt.  - R/o infection given fever 101F (blood c/s, covid testing etc) Given symptoms of weakness, paroxysmal symptoms- would try to get her in NSR: consider TEE DCCV once infection resolves, however the patient is aphrehenisve about this and wants Dr Arlyss Queen.  - chadsvasc score is atleast 6, the patient is undecided about AC. Discuss trow  -  resume home medications (atorva 10mg , losartan 25mg  daily, toprol xl 25mg  daily). I would discontinue aspirin given her age. - given slurred speech at home (but no focal deficits now): MRI brain is ordered - will follow along    Risk Assessment/Risk Scores:          CHA2DS2-VASc Score = 6  This indicates a 9.7% annual risk of stroke. The patient's score is based upon: CHF History: No HTN History: Yes Diabetes History: No Stroke History: Yes Vascular Disease History: No Age Score: 2 Gender Score: 1        For questions or updates, please contact [place] Please consult [email or web address] for contact info under    Signed, Renae Fickle, MD  09/27/2020 9:27 PM

## 2020-09-27 NOTE — ED Notes (Signed)
Patient moved to a recliner for comfort °

## 2020-09-28 ENCOUNTER — Observation Stay (HOSPITAL_COMMUNITY): Payer: Medicare HMO

## 2020-09-28 ENCOUNTER — Encounter (HOSPITAL_COMMUNITY): Payer: Self-pay | Admitting: Internal Medicine

## 2020-09-28 DIAGNOSIS — Z886 Allergy status to analgesic agent status: Secondary | ICD-10-CM | POA: Diagnosis not present

## 2020-09-28 DIAGNOSIS — Z8673 Personal history of transient ischemic attack (TIA), and cerebral infarction without residual deficits: Secondary | ICD-10-CM | POA: Diagnosis not present

## 2020-09-28 DIAGNOSIS — U071 COVID-19: Secondary | ICD-10-CM

## 2020-09-28 DIAGNOSIS — Z885 Allergy status to narcotic agent status: Secondary | ICD-10-CM | POA: Diagnosis not present

## 2020-09-28 DIAGNOSIS — I480 Paroxysmal atrial fibrillation: Secondary | ICD-10-CM | POA: Diagnosis not present

## 2020-09-28 DIAGNOSIS — I110 Hypertensive heart disease with heart failure: Secondary | ICD-10-CM | POA: Diagnosis present

## 2020-09-28 DIAGNOSIS — R531 Weakness: Secondary | ICD-10-CM | POA: Diagnosis not present

## 2020-09-28 DIAGNOSIS — Z7901 Long term (current) use of anticoagulants: Secondary | ICD-10-CM | POA: Diagnosis not present

## 2020-09-28 DIAGNOSIS — Z90710 Acquired absence of both cervix and uterus: Secondary | ICD-10-CM | POA: Diagnosis not present

## 2020-09-28 DIAGNOSIS — Z8616 Personal history of COVID-19: Secondary | ICD-10-CM | POA: Diagnosis not present

## 2020-09-28 DIAGNOSIS — F0390 Unspecified dementia without behavioral disturbance: Secondary | ICD-10-CM | POA: Diagnosis present

## 2020-09-28 DIAGNOSIS — I483 Typical atrial flutter: Secondary | ICD-10-CM | POA: Diagnosis not present

## 2020-09-28 DIAGNOSIS — Z952 Presence of prosthetic heart valve: Secondary | ICD-10-CM | POA: Diagnosis not present

## 2020-09-28 DIAGNOSIS — I252 Old myocardial infarction: Secondary | ICD-10-CM | POA: Diagnosis not present

## 2020-09-28 DIAGNOSIS — E039 Hypothyroidism, unspecified: Secondary | ICD-10-CM | POA: Diagnosis present

## 2020-09-28 DIAGNOSIS — I5021 Acute systolic (congestive) heart failure: Secondary | ICD-10-CM | POA: Diagnosis present

## 2020-09-28 DIAGNOSIS — E785 Hyperlipidemia, unspecified: Secondary | ICD-10-CM | POA: Diagnosis present

## 2020-09-28 DIAGNOSIS — Z79899 Other long term (current) drug therapy: Secondary | ICD-10-CM | POA: Diagnosis not present

## 2020-09-28 DIAGNOSIS — I4819 Other persistent atrial fibrillation: Secondary | ICD-10-CM | POA: Diagnosis present

## 2020-09-28 DIAGNOSIS — I4891 Unspecified atrial fibrillation: Secondary | ICD-10-CM | POA: Diagnosis not present

## 2020-09-28 DIAGNOSIS — Z90722 Acquired absence of ovaries, bilateral: Secondary | ICD-10-CM | POA: Diagnosis not present

## 2020-09-28 DIAGNOSIS — R001 Bradycardia, unspecified: Secondary | ICD-10-CM | POA: Diagnosis not present

## 2020-09-28 DIAGNOSIS — Z7902 Long term (current) use of antithrombotics/antiplatelets: Secondary | ICD-10-CM | POA: Diagnosis not present

## 2020-09-28 DIAGNOSIS — G9389 Other specified disorders of brain: Secondary | ICD-10-CM | POA: Diagnosis not present

## 2020-09-28 DIAGNOSIS — R509 Fever, unspecified: Secondary | ICD-10-CM | POA: Diagnosis not present

## 2020-09-28 DIAGNOSIS — Z882 Allergy status to sulfonamides status: Secondary | ICD-10-CM | POA: Diagnosis not present

## 2020-09-28 DIAGNOSIS — I272 Pulmonary hypertension, unspecified: Secondary | ICD-10-CM | POA: Diagnosis present

## 2020-09-28 DIAGNOSIS — M1990 Unspecified osteoarthritis, unspecified site: Secondary | ICD-10-CM | POA: Diagnosis present

## 2020-09-28 DIAGNOSIS — I429 Cardiomyopathy, unspecified: Secondary | ICD-10-CM | POA: Diagnosis present

## 2020-09-28 DIAGNOSIS — Z9079 Acquired absence of other genital organ(s): Secondary | ICD-10-CM | POA: Diagnosis not present

## 2020-09-28 DIAGNOSIS — I4892 Unspecified atrial flutter: Secondary | ICD-10-CM | POA: Diagnosis not present

## 2020-09-28 LAB — ECHOCARDIOGRAM COMPLETE
AR max vel: 2.02 cm2
AV Area VTI: 1.95 cm2
AV Area mean vel: 1.82 cm2
AV Mean grad: 3 mmHg
AV Peak grad: 5.7 mmHg
Ao pk vel: 1.19 m/s
Area-P 1/2: 3.03 cm2
Calc EF: 23.5 %
Height: 63 in
MV M vel: 3.3 m/s
MV Peak grad: 43.6 mmHg
MV VTI: 1.1 cm2
P 1/2 time: 410 msec
S' Lateral: 3.5 cm
Single Plane A2C EF: 35.2 %
Single Plane A4C EF: 7 %
Weight: 1553.6 oz

## 2020-09-28 LAB — CBC WITH DIFFERENTIAL/PLATELET
Abs Immature Granulocytes: 0.03 10*3/uL (ref 0.00–0.07)
Basophils Absolute: 0 10*3/uL (ref 0.0–0.1)
Basophils Relative: 1 %
Eosinophils Absolute: 0 10*3/uL (ref 0.0–0.5)
Eosinophils Relative: 1 %
HCT: 39.5 % (ref 36.0–46.0)
Hemoglobin: 13.2 g/dL (ref 12.0–15.0)
Immature Granulocytes: 1 %
Lymphocytes Relative: 22 %
Lymphs Abs: 1.4 10*3/uL (ref 0.7–4.0)
MCH: 30.5 pg (ref 26.0–34.0)
MCHC: 33.4 g/dL (ref 30.0–36.0)
MCV: 91.2 fL (ref 80.0–100.0)
Monocytes Absolute: 0.7 10*3/uL (ref 0.1–1.0)
Monocytes Relative: 10 %
Neutro Abs: 4.3 10*3/uL (ref 1.7–7.7)
Neutrophils Relative %: 65 %
Platelets: 160 10*3/uL (ref 150–400)
RBC: 4.33 MIL/uL (ref 3.87–5.11)
RDW: 14.2 % (ref 11.5–15.5)
WBC: 6.5 10*3/uL (ref 4.0–10.5)
nRBC: 0 % (ref 0.0–0.2)

## 2020-09-28 LAB — COMPREHENSIVE METABOLIC PANEL
ALT: 20 U/L (ref 0–44)
AST: 24 U/L (ref 15–41)
Albumin: 3.5 g/dL (ref 3.5–5.0)
Alkaline Phosphatase: 47 U/L (ref 38–126)
Anion gap: 9 (ref 5–15)
BUN: 18 mg/dL (ref 8–23)
CO2: 22 mmol/L (ref 22–32)
Calcium: 9.2 mg/dL (ref 8.9–10.3)
Chloride: 105 mmol/L (ref 98–111)
Creatinine, Ser: 0.91 mg/dL (ref 0.44–1.00)
GFR, Estimated: 60 mL/min — ABNORMAL LOW (ref >60)
Glucose, Bld: 105 mg/dL — ABNORMAL HIGH (ref 70–99)
Potassium: 3.9 mmol/L (ref 3.5–5.1)
Sodium: 136 mmol/L (ref 135–145)
Total Bilirubin: 1.1 mg/dL (ref 0.3–1.2)
Total Protein: 6.4 g/dL — ABNORMAL LOW (ref 6.5–8.1)

## 2020-09-28 LAB — FERRITIN: Ferritin: 64 ng/mL (ref 11–307)

## 2020-09-28 LAB — HEPARIN LEVEL (UNFRACTIONATED)
Heparin Unfractionated: 0.54 IU/mL (ref 0.30–0.70)
Heparin Unfractionated: 0.76 IU/mL — ABNORMAL HIGH (ref 0.30–0.70)

## 2020-09-28 LAB — RESP PANEL BY RT-PCR (FLU A&B, COVID) ARPGX2
Influenza A by PCR: NEGATIVE
Influenza B by PCR: NEGATIVE
SARS Coronavirus 2 by RT PCR: POSITIVE — AB

## 2020-09-28 LAB — BRAIN NATRIURETIC PEPTIDE: B Natriuretic Peptide: 327.7 pg/mL — ABNORMAL HIGH (ref 0.0–100.0)

## 2020-09-28 LAB — C-REACTIVE PROTEIN: CRP: 6.5 mg/dL — ABNORMAL HIGH (ref <1.0)

## 2020-09-28 LAB — D-DIMER, QUANTITATIVE: D-Dimer, Quant: 0.59 ug/mL-FEU — ABNORMAL HIGH (ref 0.00–0.50)

## 2020-09-28 MED ORDER — GUAIFENESIN-DM 100-10 MG/5ML PO SYRP
10.0000 mL | ORAL_SOLUTION | ORAL | Status: DC | PRN
  Administered 2020-09-29: 10 mL via ORAL
  Filled 2020-09-28: qty 10

## 2020-09-28 MED ORDER — METOPROLOL TARTRATE 5 MG/5ML IV SOLN
5.0000 mg | INTRAVENOUS | Status: DC | PRN
  Administered 2020-09-28 – 2020-09-29 (×2): 5 mg via INTRAVENOUS
  Filled 2020-09-28 (×2): qty 5

## 2020-09-28 MED ORDER — DOCUSATE SODIUM 50 MG PO CAPS
50.0000 mg | ORAL_CAPSULE | Freq: Two times a day (BID) | ORAL | Status: DC | PRN
  Administered 2020-09-28 – 2020-09-30 (×2): 50 mg via ORAL
  Filled 2020-09-28 (×3): qty 1

## 2020-09-28 MED ORDER — SODIUM CHLORIDE 0.9 % IV SOLN
200.0000 mg | Freq: Once | INTRAVENOUS | Status: AC
  Administered 2020-09-28: 200 mg via INTRAVENOUS
  Filled 2020-09-28: qty 40

## 2020-09-28 MED ORDER — HEPARIN SODIUM (PORCINE) 5000 UNIT/ML IJ SOLN: 5000.0000 [IU] | Freq: Three times a day (TID) | INTRAMUSCULAR | Status: DC

## 2020-09-28 MED ORDER — ENOXAPARIN SODIUM 30 MG/0.3ML IJ SOSY
30.0000 mg | PREFILLED_SYRINGE | INTRAMUSCULAR | Status: DC
  Administered 2020-09-28: 30 mg via SUBCUTANEOUS
  Filled 2020-09-28: qty 0.3

## 2020-09-28 MED ORDER — SODIUM CHLORIDE 0.9 % IV SOLN
100.0000 mg | Freq: Every day | INTRAVENOUS | Status: AC
  Administered 2020-09-29 – 2020-10-02 (×4): 100 mg via INTRAVENOUS
  Filled 2020-09-28 (×4): qty 20

## 2020-09-28 MED ORDER — ASCORBIC ACID 500 MG PO TABS
500.0000 mg | ORAL_TABLET | Freq: Every day | ORAL | Status: DC
  Administered 2020-09-28 – 2020-10-04 (×7): 500 mg via ORAL
  Filled 2020-09-28 (×7): qty 1

## 2020-09-28 MED ORDER — ZINC SULFATE 220 (50 ZN) MG PO CAPS
220.0000 mg | ORAL_CAPSULE | Freq: Every day | ORAL | Status: DC
  Administered 2020-09-28 – 2020-10-04 (×7): 220 mg via ORAL
  Filled 2020-09-28 (×7): qty 1

## 2020-09-28 NOTE — Progress Notes (Signed)
PROGRESS NOTE  [name] QMG:[id number] DOB: 1928-03-16 DOA: 09/26/2020 PCP: [name], MD  HPI/Recap of past 24 hours: [name] is a 87 y.o. female with history of hypertension, paroxysmal atrial fibrillation, mitral valve repair, history of stroke experienced some slurred speech PTA, lasted for 5 minutes, has been feeling weak with a fever of 101 F .  Due to all of this, decided to come to the ED. While waiting in the ER patient started experiencing palpitation.  Patient was found to be in a flutter with RVR, started on Cardizem infusion and heparin, cardiology was consulted. Chest x-ray UA was unremarkable COVID test positive.  Patient admitted for A. fib with RVR as well as COVID-19 infection.    Today, patient reports feeling weak and rundown, still with some intermittent runny nose and dry cough, denies any chest pain, shortness of breath, palpitations, abdominal pain, nausea/vomiting.  Assessment/Plan: Principal Problem:   Atrial fibrillation with RVR (HCC) Active Problems:   Weakness   Paroxysmal A-fib (HCC)   Atrial flutter with RVR Currently now normal sinus rhythm, heart rate controlled TSH unremarkable BNP 327, troponin pending Echo pending Cardiology consulted, recommend stopping IV Cardizem and heparin, if recurs, may start oral AC Continue metoprolol, aspirin Telemetry  COVID-19 infection Currently afebrile, with no leukocytosis, currently saturating well on room air Trend inflammatory markers Chest x-ray unremarkable Due to advanced age, symptomatic and high risk for deterioration, started remdesivir Continue vitamins, cough suppressants, inhalers as needed Supplemental oxygen as needed Monitor closely  Hypertension Continue losartan, metoprolol  History of prior CVA Noted to have slurred speech prior to arrival MRI brain done showed no acute findings Continue aspirin, Lipitor Telemetry  History of mitral valve repair Outpatient  follow-up  History of dementia Continue Aricept, Namenda       Estimated body mass index is 17.2 kg/m as calculated from the following:   Height as of this encounter: 5\' 3"  (1.6 m).   Weight as of this encounter: 44 kg.     Code Status: Full  Family Communication: None at bedside  Disposition Plan: Status is: Inpatient  Remains inpatient appropriate because:Inpatient level of care appropriate due to severity of illness  Dispo: The patient is from: Home              Anticipated d/c is to: Home              Patient currently is not medically stable to d/c.   Difficult to place patient No   Consultants: Cardiology  Procedures: None  Antimicrobials: None  DVT prophylaxis: Lovenox   Objective: Vitals:   09/28/20 0100 09/28/20 0129 09/28/20 0148 09/28/20 0931  BP: 119/73  123/81 110/81  Pulse: 82  (!) 54 76  Resp: 17  17 16   Temp:  98.3 F (36.8 C) 98.2 F (36.8 C) 98.2 F (36.8 C)  TempSrc:  Oral Oral Oral  SpO2: 95%  (!) 89% 100%  Weight:   44 kg   Height:   5\' 3"  (1.6 m)     Intake/Output Summary (Last 24 hours) at 09/28/2020 1423 Last data filed at 09/28/2020 1223 Gross per 24 hour  Intake 1514.43 ml  Output 1400 ml  Net 114.43 ml   Filed Weights   09/27/20 2101 09/28/20 0148  Weight: 44.5 kg 44 kg    Exam: General: NAD, frail Cardiovascular: S1, S2 present Respiratory: CTAB Abdomen: Soft, nontender, nondistended, bowel sounds present Musculoskeletal: No bilateral pedal edema noted Skin: Normal Psychiatry: Normal  mood  Neurology: No obvious focal neurologic deficits noted, strength equal in all extremities    Data Reviewed: CBC: Recent Labs  Lab 09/26/20 2024 09/28/20 0804  WBC 8.3 6.5  NEUTROABS 6.4 4.3  HGB 12.8 13.2  HCT 39.8 39.5  MCV 94.3 91.2  PLT 166 737   Basic Metabolic Panel: Recent Labs  Lab 09/26/20 2024 09/28/20 0804  NA 134* 136  K 3.8 3.9  CL 100 105  CO2 24 22  GLUCOSE 99 105*  BUN 14 18  CREATININE  0.91 0.91  CALCIUM 9.6 9.2   GFR: Estimated Creatinine Clearance: 28.5 mL/min (by C-G formula based on SCr of 0.91 mg/dL). Liver Function Tests: Recent Labs  Lab 09/26/20 2024 09/28/20 0804  AST 25 24  ALT 18 20  ALKPHOS 54 47  BILITOT 1.2 1.1  PROT 7.2 6.4*  ALBUMIN 3.9 3.5   No results for input(s): LIPASE, AMYLASE in the last 168 hours. No results for input(s): AMMONIA in the last 168 hours. Coagulation Profile: No results for input(s): INR, PROTIME in the last 168 hours. Cardiac Enzymes: No results for input(s): CKTOTAL, CKMB, CKMBINDEX, TROPONINI in the last 168 hours. BNP (last 3 results) No results for input(s): PROBNP in the last 8760 hours. HbA1C: No results for input(s): HGBA1C in the last 72 hours. CBG: No results for input(s): GLUCAP in the last 168 hours. Lipid Profile: No results for input(s): CHOL, HDL, LDLCALC, TRIG, CHOLHDL, LDLDIRECT in the last 72 hours. Thyroid Function Tests: Recent Labs    09/27/20 2115  TSH 3.945   Anemia Panel: Recent Labs    09/28/20 0804  FERRITIN 64   Urine analysis:    Component Value Date/Time   COLORURINE YELLOW 09/26/2020 2354   APPEARANCEUR CLEAR 09/26/2020 2354   LABSPEC 1.005 09/26/2020 2354   PHURINE 6.0 09/26/2020 2354   GLUCOSEU NEGATIVE 09/26/2020 2354   HGBUR SMALL (A) 09/26/2020 2354   BILIRUBINUR NEGATIVE 09/26/2020 2354   BILIRUBINUR negative 08/09/2012 1203   KETONESUR NEGATIVE 09/26/2020 2354   PROTEINUR NEGATIVE 09/26/2020 2354   UROBILINOGEN negative 08/09/2012 1203   NITRITE NEGATIVE 09/26/2020 2354   LEUKOCYTESUR NEGATIVE 09/26/2020 2354   Sepsis Labs: @LABRCNTIP (procalcitonin:4,lacticidven:4)  ) Recent Results (from the past 240 hour(s))  Resp Panel by RT-PCR (Flu A&B, Covid) Nasopharyngeal Swab     Status: Abnormal   Collection Time: 09/28/20 12:11 AM   Specimen: Nasopharyngeal Swab; Nasopharyngeal(NP) swabs in vial transport medium  Result Value Ref Range Status   SARS Coronavirus  2 by RT PCR POSITIVE (A) NEGATIVE Final    Comment: RESULT CALLED TO, READ BACK BY AND VERIFIED WITH: RN KATHY SEGEL BY MESSAN H. AT 1062 ON 7 22 2022 (NOTE) SARS-CoV-2 target nucleic acids are DETECTED.  The SARS-CoV-2 RNA is generally detectable in upper respiratory specimens during the acute phase of infection. Positive results are indicative of the presence of the identified virus, but do not rule out bacterial infection or co-infection with other pathogens not detected by the test. Clinical correlation with patient history and other diagnostic information is necessary to determine patient infection status. The expected result is Negative.  Fact Sheet for Patients: EntrepreneurPulse.com.au  Fact Sheet for Healthcare Providers: IncredibleEmployment.be  This test is not yet approved or cleared by the [place] FDA and  has been authorized for detection and/or diagnosis of SARS-CoV-2 by FDA under an Emergency Use Authorization (EUA).  This EUA will remain in effect (meaning t his test can be used) for the duration of  the COVID-19 declaration under Section 564(b)(1) of the Act, 21 U.S.C. section 360bbb-3(b)(1), unless the authorization is terminated or revoked sooner.     Influenza A by PCR NEGATIVE NEGATIVE Final   Influenza B by PCR NEGATIVE NEGATIVE Final    Comment: (NOTE) The Xpert Xpress SARS-CoV-2/FLU/RSV plus assay is intended as an aid in the diagnosis of influenza from Nasopharyngeal swab specimens and should not be used as a sole basis for treatment. Nasal washings and aspirates are unacceptable for Xpert Xpress SARS-CoV-2/FLU/RSV testing.  Fact Sheet for Patients: EntrepreneurPulse.com.au  Fact Sheet for Healthcare Providers: IncredibleEmployment.be  This test is not yet approved or cleared by the [place] FDA and has been authorized for detection and/or diagnosis of SARS-CoV-2  by FDA under an Emergency Use Authorization (EUA). This EUA will remain in effect (meaning this test can be used) for the duration of the COVID-19 declaration under Section 564(b)(1) of the Act, 21 U.S.C. section 360bbb-3(b)(1), unless the authorization is terminated or revoked.  Performed at [facility], [place] [address] [place], [place] [address]       Studies: MR BRAIN WO CONTRAST  Result Date: 09/28/2020 CLINICAL DATA:  Acute stroke suspected.  Fever. EXAM: MRI HEAD WITHOUT CONTRAST TECHNIQUE: Multiplanar, multiecho pulse sequences of the brain and surrounding structures were obtained without intravenous contrast. COMPARISON:  06/26/2020 FINDINGS: Brain: No acute infarction, hemorrhage, hydrocephalus, extra-axial collection or mass lesion. Cerebral volume loss in keeping with age. Chronic small hemorrhagic foci spanning both peripheral and central brain. Over 10 foci are seen. Chronic small vessel ischemic gliosis in the periventricular white matter, mild for age. Mild cerebral volume loss. Vascular: Normal flow voids. Skull and upper cervical spine: Negative for marrow lesion. Prominent degenerative facet spurring. Sinuses/Orbits: Negative IMPRESSION: 1. No acute finding such as infarct. 2. Remote micro hemorrhages which could be from chronic hypertension and/or amyloid angiopathy. Electronically Signed   By: Monte Fantasia M.D.   On: 09/28/2020 06:15    Scheduled Meds:  vitamin C  500 mg Oral Daily   aspirin EC  81 mg Oral Daily   atorvastatin  10 mg Oral Daily   donepezil  5 mg Oral QHS   folic acid  1 mg Oral Daily   heparin injection (subcutaneous)  5,000 Units Subcutaneous Q8H   LORazepam  0.25 mg Intravenous Once   losartan  25 mg Oral Daily   memantine  10 mg Oral BID   metoprolol succinate  12.5 mg Oral Daily   zinc sulfate  220 mg Oral Daily    Continuous Infusions:  [START ON 09/29/2020] remdesivir 100 mg in NS 100 mL       LOS: 0 days     Alma Friendly, MD [facility]  If 7PM-7AM, please contact night-coverage [email or web address] 09/28/2020, 2:23 PM

## 2020-09-28 NOTE — Progress Notes (Signed)
Progress Note  Patient Name: [name] Date of Encounter: 09/28/2020  [facility] Cardiologist: [name] MD  Subjective   Feels better today. No longer feels weak. Notes recent cough, sinus drainage and low grade fever.  Inpatient Medications    Scheduled Meds:  vitamin C  500 mg Oral Daily   aspirin EC  81 mg Oral Daily   atorvastatin  10 mg Oral Daily   donepezil  5 mg Oral QHS   folic acid  1 mg Oral Daily   LORazepam  0.25 mg Intravenous Once   losartan  25 mg Oral Daily   memantine  10 mg Oral BID   metoprolol succinate  12.5 mg Oral Daily   zinc sulfate  220 mg Oral Daily   Continuous Infusions:  diltiazem (CARDIZEM) infusion 10 mg/hr (09/28/20 0648)   heparin 700 Units/hr (09/28/20 0003)   remdesivir 200 mg in sodium chloride 0.9% 250 mL IVPB     Followed by   [START ON 09/29/2020] remdesivir 100 mg in NS 100 mL     PRN Meds: acetaminophen **OR** acetaminophen, guaiFENesin-dextromethorphan   Vital Signs    Vitals:   09/28/20 0100 09/28/20 0129 09/28/20 0148 09/28/20 0931  BP: 119/73  123/81 110/81  Pulse: 82  (!) 54 76  Resp: 17  17 16   Temp:  98.3 F (36.8 C) 98.2 F (36.8 C) 98.2 F (36.8 C)  TempSrc:  Oral Oral Oral  SpO2: 95%  (!) 89% 100%  Weight:   44 kg   Height:   5\' 3"  (1.6 m)     Intake/Output Summary (Last 24 hours) at 09/28/2020 1116 Last data filed at 09/28/2020 0943 Gross per 24 hour  Intake 994.82 ml  Output 1050 ml  Net -55.18 ml   Last 3 Weights 09/28/2020 09/27/2020 08/02/2020  Weight (lbs) 97 lb 1.6 oz 98 lb 98 lb 9.6 oz  Weight (kg) 44.044 kg 44.453 kg 44.725 kg      Telemetry    NSR rate 70-85 - Personally Reviewed  ECG    None today - Personally Reviewed  Physical Exam   GEN: Elderly WF No acute distress.   Neck: No JVD Cardiac: RRR, no murmurs, rubs, or gallops.  Respiratory: Clear to auscultation bilaterally. MS: No edema; No deformity. Neuro:  Nonfocal  Psych: Normal affect   Labs    High  Sensitivity Troponin:  No results for input(s): TROPONINIHS in the last 720 hours.    Chemistry Recent Labs  Lab 09/26/20 2024 09/28/20 0804  NA 134* 136  K 3.8 3.9  CL 100 105  CO2 24 22  GLUCOSE 99 105*  BUN 14 18  CREATININE 0.91 0.91  CALCIUM 9.6 9.2  PROT 7.2 6.4*  ALBUMIN 3.9 3.5  AST 25 24  ALT 18 20  ALKPHOS 54 47  BILITOT 1.2 1.1  GFRNONAA 60* 60*  ANIONGAP 10 9     Hematology Recent Labs  Lab 09/26/20 2024 09/28/20 0804  WBC 8.3 6.5  RBC 4.22 4.33  HGB 12.8 13.2  HCT 39.8 39.5  MCV 94.3 91.2  MCH 30.3 30.5  MCHC 32.2 33.4  RDW 14.2 14.2  PLT 166 160    BNP Recent Labs  Lab 09/28/20 0804  BNP 327.7*     DDimer  Recent Labs  Lab 09/28/20 0804  DDIMER 0.59*     Radiology    DG Chest 2 View  Result Date: 09/26/2020 CLINICAL DATA:  Generalized weakness, loss of appetite, cough  EXAM: CHEST - 2 VIEW COMPARISON:  08/22/2013 FINDINGS: Frontal and lateral views of the chest demonstrate a stable cardiac silhouette. Mitral valve prosthesis is identified. Calcified mediastinal lymph nodes are again identified consistent with previous granulomatous disease. There is a calcified granuloma in the left apex. No airspace disease, effusion, or pneumothorax. No acute bony abnormalities. IMPRESSION: 1. No acute intrathoracic process. Electronically Signed   By: Randa Ngo M.D.   On: 09/26/2020 20:33   MR BRAIN WO CONTRAST  Result Date: 09/28/2020 CLINICAL DATA:  Acute stroke suspected.  Fever. EXAM: MRI HEAD WITHOUT CONTRAST TECHNIQUE: Multiplanar, multiecho pulse sequences of the brain and surrounding structures were obtained without intravenous contrast. COMPARISON:  06/26/2020 FINDINGS: Brain: No acute infarction, hemorrhage, hydrocephalus, extra-axial collection or mass lesion. Cerebral volume loss in keeping with age. Chronic small hemorrhagic foci spanning both peripheral and central brain. Over 10 foci are seen. Chronic small vessel ischemic gliosis in  the periventricular white matter, mild for age. Mild cerebral volume loss. Vascular: Normal flow voids. Skull and upper cervical spine: Negative for marrow lesion. Prominent degenerative facet spurring. Sinuses/Orbits: Negative IMPRESSION: 1. No acute finding such as infarct. 2. Remote micro hemorrhages which could be from chronic hypertension and/or amyloid angiopathy. Electronically Signed   By: Monte Fantasia M.D.   On: 09/28/2020 06:15    Cardiac Studies   ECHO: 01/12/2019 IMPRESSIONS     1. Left ventricular ejection fraction, by visual estimation, is 60 to  65%. The left ventricle has normal function. There is no left ventricular  hypertrophy.   2. Left ventricular diastolic parameters are indeterminate.   3. Inferior basal hypokinesis.   4. Global right ventricle has normal systolic function.The right  ventricular size is normal. No increase in right ventricular wall  thickness.   5. Left atrial size was normal.   6. Right atrial size was mildly dilated.   7. The mitral valve has been repaired/replaced. Trace mitral valve  regurgitation. No evidence of mitral stenosis.   8. Post MV repair with trivial residual MR.   9. The tricuspid valve is normal in structure. Tricuspid valve  regurgitation is mild.  10. The aortic valve is tricuspid. Aortic valve regurgitation is mild.  Mild to moderate aortic valve sclerosis/calcification without any evidence  of aortic stenosis.  11. The pulmonic valve was normal in structure. Pulmonic valve  regurgitation is not visualized.  12. Mildly elevated pulmonary artery systolic pressure.  13. The inferior vena cava is normal in size with greater than 50%  respiratory variability, suggesting right atrial pressure of 3 mmHg.    Patient Profile     87 y.o. female with a hx of HTN, HLD, P. Afib -not on AC (remained in NSR), h/o MV repair, Dementia who is being seen 09/27/2020 for the evaluation of tachycardia (likley aflutter 2:1/ afib) at the  request of Dr Hal Hope.  Assessment & Plan    Atrial flutter. Now converted to NSR. Likely related to stress of Covid infection. Will DC IV cardizem and heparin. Monitor. For now would forgo anticoagulation unless she has recurrent AFib/flutter. Continue metoprolol. Resume ASA 81 mg daily.  Covid 19 infection. Per primary team. HTN S/p MV repair.      For questions or updates, please contact [place] Please consult [email or web address] for contact info under        Signed, Lakindra Wible [place], MD  09/28/2020, 11:16 AM

## 2020-09-28 NOTE — Progress Notes (Signed)
*  PRELIMINARY RESULTS* Echocardiogram 2D Echocardiogram has been performed.  Luisa Hart RDCS 09/28/2020, 2:44 PM

## 2020-09-28 NOTE — Progress Notes (Addendum)
ANTICOAGULATION CONSULT NOTE  Pharmacy Consult for heparin Indication: atrial fibrillation  Allergies  Allergen Reactions   Actonel [Risedronate Sodium] Nausea And Vomiting   Aspirin-Caffeine     Upset stomach    Codeine     REACTION: upset stomach   Sulfonamide Derivatives     REACTION: swollen tongue    Patient Measurements: Height: 5\' 3"  (160 cm) Weight: 44 kg (97 lb 1.6 oz) IBW/kg (Calculated) : 52.4 Heparin Dosing Weight: TBW  Vital Signs: Temp: 98.2 F (36.8 C) (07/22 0148) Temp Source: Oral (07/22 0148) BP: 123/81 (07/22 0148) Pulse Rate: 54 (07/22 0148)  Labs: Recent Labs    09/26/20 2024 09/28/20 0301  HGB 12.8  --   HCT 39.8  --   PLT 166  --   HEPARINUNFRC  --  0.54  CREATININE 0.91  --      Estimated Creatinine Clearance: 28.5 mL/min (by C-G formula based on SCr of 0.91 mg/dL).   Medical History: Past Medical History:  Diagnosis Date   Arthritis    Atrial fibrillation ([employer])    postoperative   COVID-19    CVA (cerebral vascular accident) ([place])    HTN (hypertension)    MR (mitral regurgitation)    severe   NSTEMI (non-ST elevated myocardial infarction) ([place])    Pericarditis    postoperative    Assessment: 43 YOF presenting with weakness, found to be in afib, she has hx of pAfib and has been on Xarelto in past however is currently not on anticoagulation.  CBC wnl.  Initial heparin level is therapeutic at 0.54 but was drawn ~3h after starting.  Goal of Therapy:  Heparin level 0.3-0.7 units/ml Monitor platelets by anticoagulation protocol: Yes   Plan:  Continue heparin 700 units/h for now Confirmatory level later this am  ADDENDUM: Repeat heparin level at appropriate time interval is slightly above goal at 0.76.  Plan: Reduce heparin to 600 units/h Recheck heparin level with am labs   Arrie Senate, PharmD, [place], [facility] Clinical Pharmacist [phone] Please check AMION for all [place] numbers 09/28/2020

## 2020-09-29 DIAGNOSIS — R531 Weakness: Secondary | ICD-10-CM | POA: Diagnosis not present

## 2020-09-29 DIAGNOSIS — U071 COVID-19: Secondary | ICD-10-CM | POA: Diagnosis not present

## 2020-09-29 DIAGNOSIS — I4891 Unspecified atrial fibrillation: Secondary | ICD-10-CM | POA: Diagnosis not present

## 2020-09-29 LAB — C-REACTIVE PROTEIN: CRP: 5.6 mg/dL — ABNORMAL HIGH (ref <1.0)

## 2020-09-29 LAB — FERRITIN: Ferritin: 172 ng/mL (ref 11–307)

## 2020-09-29 LAB — CBC WITH DIFFERENTIAL/PLATELET
Abs Immature Granulocytes: 0.01 10*3/uL (ref 0.00–0.07)
Basophils Absolute: 0 10*3/uL (ref 0.0–0.1)
Basophils Relative: 0 %
Eosinophils Absolute: 0.1 10*3/uL (ref 0.0–0.5)
Eosinophils Relative: 1 %
HCT: 39 % (ref 36.0–46.0)
Hemoglobin: 13 g/dL (ref 12.0–15.0)
Immature Granulocytes: 0 %
Lymphocytes Relative: 27 %
Lymphs Abs: 1.3 10*3/uL (ref 0.7–4.0)
MCH: 30.2 pg (ref 26.0–34.0)
MCHC: 33.3 g/dL (ref 30.0–36.0)
MCV: 90.7 fL (ref 80.0–100.0)
Monocytes Absolute: 0.6 10*3/uL (ref 0.1–1.0)
Monocytes Relative: 13 %
Neutro Abs: 2.7 10*3/uL (ref 1.7–7.7)
Neutrophils Relative %: 59 %
Platelets: 170 10*3/uL (ref 150–400)
RBC: 4.3 MIL/uL (ref 3.87–5.11)
RDW: 14.2 % (ref 11.5–15.5)
WBC: 4.7 10*3/uL (ref 4.0–10.5)
nRBC: 0 % (ref 0.0–0.2)

## 2020-09-29 LAB — COMPREHENSIVE METABOLIC PANEL
ALT: 21 U/L (ref 0–44)
AST: 25 U/L (ref 15–41)
Albumin: 3.2 g/dL — ABNORMAL LOW (ref 3.5–5.0)
Alkaline Phosphatase: 49 U/L (ref 38–126)
Anion gap: 7 (ref 5–15)
BUN: 18 mg/dL (ref 8–23)
CO2: 25 mmol/L (ref 22–32)
Calcium: 9.1 mg/dL (ref 8.9–10.3)
Chloride: 106 mmol/L (ref 98–111)
Creatinine, Ser: 0.87 mg/dL (ref 0.44–1.00)
GFR, Estimated: >60 mL/min (ref >60)
Glucose, Bld: 100 mg/dL — ABNORMAL HIGH (ref 70–99)
Potassium: 4 mmol/L (ref 3.5–5.1)
Sodium: 138 mmol/L (ref 135–145)
Total Bilirubin: 0.9 mg/dL (ref 0.3–1.2)
Total Protein: 6.1 g/dL — ABNORMAL LOW (ref 6.5–8.1)

## 2020-09-29 LAB — TROPONIN I (HIGH SENSITIVITY)
Troponin I (High Sensitivity): 31 ng/L — ABNORMAL HIGH (ref <18)
Troponin I (High Sensitivity): 28 ng/L — ABNORMAL HIGH (ref <18)

## 2020-09-29 LAB — D-DIMER, QUANTITATIVE: D-Dimer, Quant: 0.44 ug/mL-FEU (ref 0.00–0.50)

## 2020-09-29 LAB — HEPARIN LEVEL (UNFRACTIONATED): Heparin Unfractionated: 0.18 IU/mL — ABNORMAL LOW (ref 0.30–0.70)

## 2020-09-29 MED ORDER — AMIODARONE HCL IN DEXTROSE 360-4.14 MG/200ML-% IV SOLN
30.0000 mg/h | INTRAVENOUS | Status: DC
  Administered 2020-09-29 – 2020-10-02 (×6): 30 mg/h via INTRAVENOUS
  Filled 2020-09-29 (×5): qty 200

## 2020-09-29 MED ORDER — SODIUM CHLORIDE 0.9% FLUSH
10.0000 mL | Freq: Two times a day (BID) | INTRAVENOUS | Status: DC
Start: 2020-09-29 — End: 2020-10-04
  Administered 2020-09-29 – 2020-10-04 (×8): 10 mL

## 2020-09-29 MED ORDER — POLYETHYLENE GLYCOL 3350 17 G PO PACK
17.0000 g | PACK | Freq: Every day | ORAL | Status: DC
  Administered 2020-09-30 – 2020-10-01 (×2): 17 g via ORAL
  Filled 2020-09-29 (×3): qty 1

## 2020-09-29 MED ORDER — SODIUM CHLORIDE 0.9% FLUSH: 10.0000 mL | INTRAVENOUS | Status: DC | PRN

## 2020-09-29 MED ORDER — DILTIAZEM HCL-DEXTROSE 125-5 MG/125ML-% IV SOLN (PREMIX)
5.0000 mg/h | INTRAVENOUS | Status: DC
Start: 2020-09-29 — End: 2020-09-29
  Administered 2020-09-29: 5 mg/h via INTRAVENOUS
  Administered 2020-09-29 (×2): 7.5 mg/h via INTRAVENOUS
  Filled 2020-09-29: qty 125

## 2020-09-29 MED ORDER — CHLORHEXIDINE GLUCONATE CLOTH 2 % EX PADS
6.0000 | MEDICATED_PAD | Freq: Every day | CUTANEOUS | Status: DC
  Administered 2020-10-01 – 2020-10-04 (×4): 6 via TOPICAL

## 2020-09-29 MED ORDER — HEPARIN (PORCINE) 25000 UT/250ML-% IV SOLN
650.0000 [IU]/h | INTRAVENOUS | Status: DC
  Administered 2020-09-29: 600 [IU]/h via INTRAVENOUS
  Filled 2020-09-29: qty 250

## 2020-09-29 MED ORDER — AMIODARONE LOAD VIA INFUSION
150.0000 mg | Freq: Once | INTRAVENOUS | Status: AC
  Administered 2020-09-29: 150 mg via INTRAVENOUS
  Filled 2020-09-29: qty 83.34

## 2020-09-29 MED ORDER — AMIODARONE HCL IN DEXTROSE 360-4.14 MG/200ML-% IV SOLN
60.0000 mg/h | INTRAVENOUS | Status: AC
  Administered 2020-09-29: 60 mg/h via INTRAVENOUS

## 2020-09-29 NOTE — Progress Notes (Signed)
ANTICOAGULATION CONSULT NOTE  Pharmacy Consult for heparin Indication: atrial fibrillation  Patient Measurements: Height: 5\' 3"  (160 cm) Weight: 42.5 kg (93 lb 11.1 oz) IBW/kg (Calculated) : 52.4 Heparin Dosing Weight: TBW  Vital Signs: Temp: 98.4 F (36.9 C) (07/23 2041) Temp Source: Oral (07/23 2041) BP: 126/82 (07/23 2041) Pulse Rate: 113 (07/23 2041)  Labs: Recent Labs    09/28/20 0301 09/28/20 0804 09/28/20 2328 09/29/20 0551 09/29/20 1940  HGB  --  13.2  --  13.0  --   HCT  --  39.5  --  39.0  --   PLT  --  160  --  170  --   HEPARINUNFRC 0.54 0.76*  --   --  0.18*  CREATININE  --  0.91  --  0.87  --   TROPONINIHS  --   --  31* 28*  --      Estimated Creatinine Clearance: 28.8 mL/min (by C-G formula based on SCr of 0.87 mg/dL).  Assessment: 61 YOF presenting with weakness, found to be in afib again after previous return to NSR. She has hx of pAfib and has been on Xarelto in past however is currently not on anticoagulation. CBC unremarkable. MRI 7/22 reported no bleeding. Pharmacy consulted to dose Heparin.  Heparin level this evening is SUBtherapeutic (0.18, goal of 0.3-0.7). No bleeding or issues noted per discussion with RN.   Goal of Therapy:  Heparin level 0.3-0.7 units/ml Monitor platelets by anticoagulation protocol: Yes   Plan:  - Increase Heparin to 650 units/hr (6.5 ml/hr) - Will continue to monitor for any signs/symptoms of bleeding and will follow up with heparin level in 8 hours   Thank you for allowing pharmacy to be a part of this patient's care.  Alycia Rossetti, PharmD, BCPS Clinical Pharmacist Clinical phone for 09/29/2020: [phone] 09/29/2020 9:24 PM   **Pharmacist phone directory can now be found on [email or web address] ([email or web address]).  Listed under [place].

## 2020-09-29 NOTE — Progress Notes (Signed)
Progress Note  Patient Name: [name] Date of Encounter: 09/29/2020  [facility] Cardiologist: [name] MD  Subjective   Unfortunately went back into atrial fibrillation yesterday.  Has an uneasy feeling, but cannot fully describe her overall symptoms.  Inpatient Medications    Scheduled Meds:  amiodarone  150 mg Intravenous Once   vitamin C  500 mg Oral Daily   aspirin EC  81 mg Oral Daily   atorvastatin  10 mg Oral Daily   donepezil  5 mg Oral QHS   enoxaparin (LOVENOX) injection  30 mg Subcutaneous A26J   folic acid  1 mg Oral Daily   LORazepam  0.25 mg Intravenous Once   losartan  25 mg Oral Daily   memantine  10 mg Oral BID   polyethylene glycol  17 g Oral Daily   zinc sulfate  220 mg Oral Daily   Continuous Infusions:  amiodarone     Followed by   amiodarone     diltiazem (CARDIZEM) infusion 7.5 mg/hr (09/29/20 0513)   remdesivir 100 mg in NS 100 mL     PRN Meds: acetaminophen **OR** acetaminophen, docusate sodium, guaiFENesin-dextromethorphan   Vital Signs    Vitals:   09/29/20 0400 09/29/20 0500 09/29/20 0522 09/29/20 0729  BP: 119/87 114/68 103/70 112/73  Pulse:   82 (!) 103  Resp:   19 18  Temp:  98.5 F (36.9 C) 98.4 F (36.9 C) (!) 97.5 F (36.4 C)  TempSrc:  Oral Oral Oral  SpO2:  96% 96% 95%  Weight:   42.5 kg   Height:        Intake/Output Summary (Last 24 hours) at 09/29/2020 0758 Last data filed at 09/29/2020 0524 Gross per 24 hour  Intake 985.94 ml  Output 800 ml  Net 185.94 ml    Last 3 Weights 09/29/2020 09/28/2020 09/27/2020  Weight (lbs) 93 lb 11.1 oz 97 lb 1.6 oz 98 lb  Weight (kg) 42.5 kg 44.044 kg 44.453 kg      Telemetry   Atrial fibrillation-personally reviewed  ECG    Atrial flutter-personally reviewed  Physical Exam   GEN: Thin, elderly female HEENT: normal  Neck: no JVD, carotid bruits, or masses Cardiac: Tachycardic, irregular; no murmurs, rubs, or gallops,no edema  Respiratory:  clear to  auscultation bilaterally, normal work of breathing GI: soft, nontender, nondistended, + BS MS: no deformity or atrophy  Skin: warm and dry Neuro:  Strength and sensation are intact Psych: euthymic mood, full affect   Labs    High Sensitivity Troponin:   Recent Labs  Lab 09/28/20 2328 09/29/20 0551  TROPONINIHS 31* 28*       Chemistry Recent Labs  Lab 09/26/20 2024 09/28/20 0804 09/29/20 0551  NA 134* 136 138  K 3.8 3.9 4.0  CL 100 105 106  CO2 24 22 25   GLUCOSE 99 105* 100*  BUN 14 18 18   CREATININE 0.91 0.91 0.87  CALCIUM 9.6 9.2 9.1  PROT 7.2 6.4* 6.1*  ALBUMIN 3.9 3.5 3.2*  AST 25 24 25   ALT 18 20 21   ALKPHOS 54 47 49  BILITOT 1.2 1.1 0.9  GFRNONAA 60* 60* >60  ANIONGAP 10 9 7       Hematology Recent Labs  Lab 09/26/20 2024 09/28/20 0804 09/29/20 0551  WBC 8.3 6.5 4.7  RBC 4.22 4.33 4.30  HGB 12.8 13.2 13.0  HCT 39.8 39.5 39.0  MCV 94.3 91.2 90.7  MCH 30.3 30.5 30.2  MCHC 32.2 33.4 33.3  RDW 14.2 14.2 14.2  PLT 166 160 170     BNP Recent Labs  Lab 09/28/20 0804  BNP 327.7*      DDimer  Recent Labs  Lab 09/28/20 0804 09/29/20 0551  DDIMER 0.59* 0.44      Radiology    MR BRAIN WO CONTRAST  Result Date: 09/28/2020 CLINICAL DATA:  Acute stroke suspected.  Fever. EXAM: MRI HEAD WITHOUT CONTRAST TECHNIQUE: Multiplanar, multiecho pulse sequences of the brain and surrounding structures were obtained without intravenous contrast. COMPARISON:  06/26/2020 FINDINGS: Brain: No acute infarction, hemorrhage, hydrocephalus, extra-axial collection or mass lesion. Cerebral volume loss in keeping with age. Chronic small hemorrhagic foci spanning both peripheral and central brain. Over 10 foci are seen. Chronic small vessel ischemic gliosis in the periventricular white matter, mild for age. Mild cerebral volume loss. Vascular: Normal flow voids. Skull and upper cervical spine: Negative for marrow lesion. Prominent degenerative facet spurring.  Sinuses/Orbits: Negative IMPRESSION: 1. No acute finding such as infarct. 2. Remote micro hemorrhages which could be from chronic hypertension and/or amyloid angiopathy. Electronically Signed   By: Monte Fantasia M.D.   On: 09/28/2020 06:15   ECHOCARDIOGRAM COMPLETE  Result Date: 09/28/2020    ECHOCARDIOGRAM REPORT   Patient Name:   [name] Date of Exam: 09/28/2020 Medical Rec #:  [id number]    Height:       63.0 in Accession #:    [id number]   Weight:       97.1 lb Date of Birth:  06-16-1928    BSA:          1.422 m Patient Age:    87 years     BP:           123/81 mmHg Patient Gender: F            HR:           69 bpm. Exam Location:  Inpatient Procedure: 2D Echo, Cardiac Doppler and Color Doppler Indications:    Atrial fibrillation  History:        Patient has prior history of Echocardiogram examinations, most                 recent 01/12/2019. Previous Myocardial Infarction and Pericardial                 Disease, Stroke, Arrythmias:Atrial Fibrillation,                 Signs/Symptoms:Murmur; Risk Factors:Hypertension.                  Mitral Valve: valve is present in the mitral position.  Sonographer:    Luisa Hart RDCS Referring Phys: [place]  1. Global hypokinesis (worse in inferolateral wall); overall moderate to severe LV dysfunction; s/p MV repair with mean gradient of 4 mmHg and trace AI; compared to 01/12/19, LV function is worse.  2. Left ventricular ejection fraction, by estimation, is 30 to 35%. The left ventricle has moderate to severely decreased function. The left ventricle demonstrates regional wall motion abnormalities (see scoring diagram/findings for description). Left ventricular diastolic parameters are indeterminate. Elevated left atrial pressure.  3. Right ventricular systolic function is normal. The right ventricular size is normal. There is mildly elevated pulmonary artery systolic pressure.  4. Left atrial size was mildly dilated.  5. The mitral valve  has been repaired/replaced. Trivial mitral valve regurgitation. No evidence of mitral stenosis. There is a present in the mitral position.  6. The aortic valve is tricuspid. Aortic valve regurgitation is mild. Mild aortic valve sclerosis is present, with no evidence of aortic valve stenosis.  7. The inferior vena cava is normal in size with greater than 50% respiratory variability, suggesting right atrial pressure of 3 mmHg. FINDINGS  Left Ventricle: Left ventricular ejection fraction, by estimation, is 30 to 35%. The left ventricle has moderate to severely decreased function. The left ventricle demonstrates regional wall motion abnormalities. The left ventricular internal cavity size was normal in size. There is no left ventricular hypertrophy. Left ventricular diastolic parameters are indeterminate. Elevated left atrial pressure. Right Ventricle: The right ventricular size is normal. Right ventricular systolic function is normal. There is mildly elevated pulmonary artery systolic pressure. The tricuspid regurgitant velocity is 3.00 m/s, and with an assumed right atrial pressure of 3 mmHg, the estimated right ventricular systolic pressure is 24.0 mmHg. Left Atrium: Left atrial size was mildly dilated. Right Atrium: Right atrial size was normal in size. Pericardium: There is no evidence of pericardial effusion. Mitral Valve: The mitral valve has been repaired/replaced. Mild mitral annular calcification. Trivial mitral valve regurgitation. There is a present in the mitral position. No evidence of mitral valve stenosis. MV peak gradient, 9.5 mmHg. The mean mitral  valve gradient is 4.0 mmHg. Tricuspid Valve: The tricuspid valve is normal in structure. Tricuspid valve regurgitation is mild . No evidence of tricuspid stenosis. Aortic Valve: The aortic valve is tricuspid. Aortic valve regurgitation is mild. Aortic regurgitation PHT measures 410 msec. Mild aortic valve sclerosis is present, with no evidence of aortic  valve stenosis. Aortic valve mean gradient measures 3.0 mmHg. Aortic valve peak gradient measures 5.7 mmHg. Aortic valve area, by VTI measures 1.95 cm. Pulmonic Valve: The pulmonic valve was normal in structure. Pulmonic valve regurgitation is mild. No evidence of pulmonic stenosis. Aorta: The aortic root is normal in size and structure. Venous: The inferior vena cava is normal in size with greater than 50% respiratory variability, suggesting right atrial pressure of 3 mmHg. IAS/Shunts: No atrial level shunt detected by color flow Doppler. Additional Comments: Global hypokinesis (worse in inferolateral wall); overall moderate to severe LV dysfunction; s/p MV repair with mean gradient of 4 mmHg and trace AI; compared to 01/12/19, LV function is worse.  LEFT VENTRICLE PLAX 2D LVIDd:         4.30 cm     Diastology LVIDs:         3.50 cm     LV e' medial:    3.78 cm/s LV PW:         1.10 cm     LV E/e' medial:  38.4 LV IVS:        1.10 cm     LV e' lateral:   6.25 cm/s LVOT diam:     2.00 cm     LV E/e' lateral: 23.2 LV SV:         49 LV SV Index:   35 LVOT Area:     3.14 cm  LV Volumes (MOD) LV vol d, MOD A2C: 58.8 ml LV vol d, MOD A4C: 46.9 ml LV vol s, MOD A2C: 38.1 ml LV vol s, MOD A4C: 43.6 ml LV SV MOD A2C:     20.7 ml LV SV MOD A4C:     46.9 ml LV SV MOD BP:      12.5 ml RIGHT VENTRICLE RV Basal diam:  3.80 cm RV Mid diam:    2.50 cm RV S prime:  9.28 cm/s TAPSE (M-mode): 1.4 cm LEFT ATRIUM             Index       RIGHT ATRIUM           Index LA Vol (A2C):   58.4 ml 41.06 ml/m RA Area:     14.80 cm LA Vol (A4C):   47.1 ml 33.11 ml/m RA Volume:   36.50 ml  25.66 ml/m LA Biplane Vol: 54.8 ml 38.53 ml/m  AORTIC VALVE                   PULMONIC VALVE AV Area (Vmax):    2.02 cm    PV Vmax:       0.97 m/s AV Area (Vmean):   1.82 cm    PV Vmean:      66.500 cm/s AV Area (VTI):     1.95 cm    PV VTI:        0.193 m AV Vmax:           119.00 cm/s PV Peak grad:  3.8 mmHg AV Vmean:          85.000 cm/s PV Mean  grad:  2.0 mmHg AV VTI:            0.253 m AV Peak Grad:      5.7 mmHg AV Mean Grad:      3.0 mmHg LVOT Vmax:         76.50 cm/s LVOT Vmean:        49.300 cm/s PULMONARY ARTERY LVOT VTI:          0.157 m     MPA diam:        2.40 cm LVOT/AV VTI ratio: 0.62 AI PHT:            410 msec  AORTA Ao Root diam: 3.20 cm Ao Asc diam:  3.10 cm MITRAL VALVE                TRICUSPID VALVE MV Area (PHT): 3.03 cm     TR Peak grad:   36.0 mmHg MV Area VTI:   1.10 cm     TR Vmax:        300.00 cm/s MV Peak grad:  9.5 mmHg MV Mean grad:  4.0 mmHg     SHUNTS MV Vmax:       1.54 m/s     Systemic VTI:  0.16 m MV Vmean:      87.6 cm/s    Systemic Diam: 2.00 cm MV Decel Time: 250 msec MR Peak grad: 43.6 mmHg MR Vmax:      330.00 cm/s MV E velocity: 145.00 cm/s MV A velocity: 88.80 cm/s MV E/A ratio:  1.63 Kirk Ruths MD Electronically signed by Kirk Ruths MD Signature Date/Time: 09/28/2020/3:31:45 PM    Final     Cardiac Studies   ECHO: 01/12/2019 IMPRESSIONS     1. Left ventricular ejection fraction, by visual estimation, is 60 to  65%. The left ventricle has normal function. There is no left ventricular  hypertrophy.   2. Left ventricular diastolic parameters are indeterminate.   3. Inferior basal hypokinesis.   4. Global right ventricle has normal systolic function.The right  ventricular size is normal. No increase in right ventricular wall  thickness.   5. Left atrial size was normal.   6. Right atrial size was mildly dilated.   7. The mitral valve has been repaired/replaced. Trace mitral valve  regurgitation. No  evidence of mitral stenosis.   8. Post MV repair with trivial residual MR.   9. The tricuspid valve is normal in structure. Tricuspid valve  regurgitation is mild.  10. The aortic valve is tricuspid. Aortic valve regurgitation is mild.  Mild to moderate aortic valve sclerosis/calcification without any evidence  of aortic stenosis.  11. The pulmonic valve was normal in structure. Pulmonic  valve  regurgitation is not visualized.  12. Mildly elevated pulmonary artery systolic pressure.  13. The inferior vena cava is normal in size with greater than 50%  respiratory variability, suggesting right atrial pressure of 3 mmHg.    Patient Profile     87 y.o. female with a hx of HTN, HLD, P. Afib -not on AC (remained in NSR), h/o MV repair, Dementia who is being seen 09/27/2020 for the evaluation of tachycardia (likley aflutter 2:1/ afib) at the request of Dr Hal Hope.  Assessment & Plan    Atrial fibrillation/atrial flutter: Unfortunately has gone back into atrial fibrillation.  Potentially related to COVID infection.  I have started her on IV amiodarone which Haygen Zebrowski hopefully convert her to sinus rhythm and keep her in sinus rhythm as she improves from her COVID infection, though she is minimally symptomatic from this.  I have also started a heparin drip.  S   COVID-19 infection: Plan per primary team Hypertension: Currently well controlled Mitral valve repair: Stable on most recent echo.     For questions or updates, please contact [place] Please consult [email or web address] for contact info under        Signed, Karmelo Bass Meredith Leeds, MD  09/29/2020, 7:58 AM

## 2020-09-29 NOTE — Progress Notes (Signed)
PT Cancellation Note  Patient Details Name: [name] MRN: [id number] DOB: 11/13/1928   Cancelled Treatment:    Reason Eval/Treat Not Completed: Patient not medically ready. RN requesting hold off on PT eval this date as pt went back into a-fib RVR last night and continues to have uncontrolled a-fib with a resting HR of 130s, even on cardiac drips. Will follow-up another day as appropriate.   Moishe Spice, PT, DPT Acute Rehabilitation Services  Pager: [phone] Office: [place] 09/29/2020, 12:21 PM

## 2020-09-29 NOTE — Progress Notes (Signed)
Md notified that pt continues to have irregular HR that sustains in the 130s. Pt remains vitally stable otherwise. Orders given to start Cardizem gtt.

## 2020-09-29 NOTE — Progress Notes (Addendum)
Pt converted to A. Fib/A flutter rhytm @ 2215 with HR in the 140s. Pt BP stable and pt was otherwise asymptomatic. MD notified, orders for EKG and troponin. While receiving EKG data, pt converted to irregular ST w/ RBB. Md notified, orders given for PRN metoprolol for HR >110.  Metoprolol given @ 2346, since admin HR has sustained in the 120s with frequent jumps to the 140s. Pt has remained hemodynamically stable. MD paged. Awaiting orders.   Update: orders to continue to monitor and notify for HR sustaining >130.

## 2020-09-29 NOTE — Progress Notes (Signed)
PROGRESS NOTE  [name]:[id number] DOB: 03/15/1928 DOA: 09/26/2020 PCP: [name], MD  HPI/Recap of past 24 hours: [name] is a 87 y.o. female with history of hypertension, paroxysmal atrial fibrillation, mitral valve repair, history of stroke experienced some slurred speech PTA, lasted for 5 minutes, has been feeling weak with a fever of 101 F .  Due to all of this, decided to come to the ED. While waiting in the ER patient started experiencing palpitation.  Patient was found to be in a flutter with RVR, started on Cardizem infusion and heparin, cardiology was consulted. Chest x-ray UA was unremarkable COVID test positive.  Patient admitted for A. fib with RVR as well as COVID-19 infection.    Last night, patient went back into A. fib, denies any worsening shortness of breath, abdominal pain, chest pain, nausea/vomiting, fever/chills.   Assessment/Plan: Principal Problem:   Atrial fibrillation with RVR (HCC) Active Problems:   Weakness   Paroxysmal A-fib (HCC)   Atrial flutter with RVR Currently in A. fib, HR uncontrolled TSH unremarkable BNP 327, troponin mildly elevated with flat trend Echo done showed global hypokinesis, with moderate to severe LV dysfunction, EF of 30 to 35%, with regional wall motion abnormality Cardiology consulted, started her on IV amiodarone and heparin drip Continue aspirin Telemetry  New acute systolic HF Does not appear volume overloaded Echo has noted above with EF of 30 to 35% Cardiology consulted Strict I's and O's, daily weight Telemetry  COVID-19 infection Currently afebrile, with no leukocytosis, currently saturating well on room air Trend inflammatory markers Chest x-ray unremarkable Due to advanced age, symptomatic and high risk for deterioration, started remdesivir Continue vitamins, cough suppressants, inhalers as needed Supplemental oxygen as needed Monitor closely  Hypertension Continue losartan  History of  prior CVA Noted to have slurred speech prior to arrival MRI brain done showed no acute findings Continue aspirin, Lipitor Telemetry  History of mitral valve repair Outpatient follow-up  History of dementia Continue Aricept, Namenda       Estimated body mass index is 16.6 kg/m as calculated from the following:   Height as of this encounter: 5\' 3"  (1.6 m).   Weight as of this encounter: 42.5 kg.     Code Status: Full  Family Communication: None at bedside  Disposition Plan: Status is: Inpatient  Remains inpatient appropriate because:Inpatient level of care appropriate due to severity of illness  Dispo: The patient is from: Home              Anticipated d/c is to: Home              Patient currently is not medically stable to d/c.   Difficult to place patient No   Consultants: Cardiology  Procedures: None  Antimicrobials: None  DVT prophylaxis: Heparin drip   Objective: Vitals:   09/29/20 1000 09/29/20 1023 09/29/20 1148 09/29/20 1553  BP: 117/75 107/75 114/72 101/67  Pulse:   (!) 118 85  Resp:   16 18  Temp:   98.4 F (36.9 C) (!) 97.4 F (36.3 C)  TempSrc:   Oral Oral  SpO2:   95% 97%  Weight:      Height:        Intake/Output Summary (Last 24 hours) at 09/29/2020 1653 Last data filed at 09/29/2020 1207 Gross per 24 hour  Intake 106.33 ml  Output 650 ml  Net -543.67 ml   Filed Weights   09/27/20 2101 09/28/20 0148 09/29/20 0522  Weight:  44.5 kg 44 kg 42.5 kg    Exam: General: NAD, frail Cardiovascular: S1, S2 present Respiratory: CTAB Abdomen: Soft, nontender, nondistended, bowel sounds present Musculoskeletal: No bilateral pedal edema noted Skin: Normal Psychiatry: Normal mood  Neurology: No obvious focal neurologic deficits noted, strength equal in all extremities    Data Reviewed: CBC: Recent Labs  Lab 09/26/20 2024 09/28/20 0804 09/29/20 0551  WBC 8.3 6.5 4.7  NEUTROABS 6.4 4.3 2.7  HGB 12.8 13.2 13.0  HCT 39.8 39.5  39.0  MCV 94.3 91.2 90.7  PLT 166 160 381   Basic Metabolic Panel: Recent Labs  Lab 09/26/20 2024 09/28/20 0804 09/29/20 0551  NA 134* 136 138  K 3.8 3.9 4.0  CL 100 105 106  CO2 24 22 25   GLUCOSE 99 105* 100*  BUN 14 18 18   CREATININE 0.91 0.91 0.87  CALCIUM 9.6 9.2 9.1   GFR: Estimated Creatinine Clearance: 28.8 mL/min (by C-G formula based on SCr of 0.87 mg/dL). Liver Function Tests: Recent Labs  Lab 09/26/20 2024 09/28/20 0804 09/29/20 0551  AST 25 24 25   ALT 18 20 21   ALKPHOS 54 47 49  BILITOT 1.2 1.1 0.9  PROT 7.2 6.4* 6.1*  ALBUMIN 3.9 3.5 3.2*   No results for input(s): LIPASE, AMYLASE in the last 168 hours. No results for input(s): AMMONIA in the last 168 hours. Coagulation Profile: No results for input(s): INR, PROTIME in the last 168 hours. Cardiac Enzymes: No results for input(s): CKTOTAL, CKMB, CKMBINDEX, TROPONINI in the last 168 hours. BNP (last 3 results) No results for input(s): PROBNP in the last 8760 hours. HbA1C: No results for input(s): HGBA1C in the last 72 hours. CBG: No results for input(s): GLUCAP in the last 168 hours. Lipid Profile: No results for input(s): CHOL, HDL, LDLCALC, TRIG, CHOLHDL, LDLDIRECT in the last 72 hours. Thyroid Function Tests: Recent Labs    09/27/20 2115  TSH 3.945   Anemia Panel: Recent Labs    09/28/20 0804 09/29/20 0551  FERRITIN 64 172   Urine analysis:    Component Value Date/Time   COLORURINE YELLOW 09/26/2020 2354   APPEARANCEUR CLEAR 09/26/2020 2354   LABSPEC 1.005 09/26/2020 2354   PHURINE 6.0 09/26/2020 2354   GLUCOSEU NEGATIVE 09/26/2020 2354   HGBUR SMALL (A) 09/26/2020 2354   BILIRUBINUR NEGATIVE 09/26/2020 2354   BILIRUBINUR negative 08/09/2012 1203   KETONESUR NEGATIVE 09/26/2020 2354   PROTEINUR NEGATIVE 09/26/2020 2354   UROBILINOGEN negative 08/09/2012 1203   NITRITE NEGATIVE 09/26/2020 2354   LEUKOCYTESUR NEGATIVE 09/26/2020 2354   Sepsis  Labs: @LABRCNTIP (procalcitonin:4,lacticidven:4)  ) Recent Results (from the past 240 hour(s))  Resp Panel by RT-PCR (Flu A&B, Covid) Nasopharyngeal Swab     Status: Abnormal   Collection Time: 09/28/20 12:11 AM   Specimen: Nasopharyngeal Swab; Nasopharyngeal(NP) swabs in vial transport medium  Result Value Ref Range Status   SARS Coronavirus 2 by RT PCR POSITIVE (A) NEGATIVE Final    Comment: RESULT CALLED TO, READ BACK BY AND VERIFIED WITH: RN KATHY SEGEL BY MESSAN H. AT 0175 ON 7 22 2022 (NOTE) SARS-CoV-2 target nucleic acids are DETECTED.  The SARS-CoV-2 RNA is generally detectable in upper respiratory specimens during the acute phase of infection. Positive results are indicative of the presence of the identified virus, but do not rule out bacterial infection or co-infection with other pathogens not detected by the test. Clinical correlation with patient history and other diagnostic information is necessary to determine patient infection status. The expected result is Negative.  Fact Sheet for Patients: EntrepreneurPulse.com.au  Fact Sheet for Healthcare Providers: IncredibleEmployment.be  This test is not yet approved or cleared by the [place] FDA and  has been authorized for detection and/or diagnosis of SARS-CoV-2 by FDA under an Emergency Use Authorization (EUA).  This EUA will remain in effect (meaning t his test can be used) for the duration of  the COVID-19 declaration under Section 564(b)(1) of the Act, 21 U.S.C. section 360bbb-3(b)(1), unless the authorization is terminated or revoked sooner.     Influenza A by PCR NEGATIVE NEGATIVE Final   Influenza B by PCR NEGATIVE NEGATIVE Final    Comment: (NOTE) The Xpert Xpress SARS-CoV-2/FLU/RSV plus assay is intended as an aid in the diagnosis of influenza from Nasopharyngeal swab specimens and should not be used as a sole basis for treatment. Nasal washings and aspirates are  unacceptable for Xpert Xpress SARS-CoV-2/FLU/RSV testing.  Fact Sheet for Patients: EntrepreneurPulse.com.au  Fact Sheet for Healthcare Providers: IncredibleEmployment.be  This test is not yet approved or cleared by the [place] FDA and has been authorized for detection and/or diagnosis of SARS-CoV-2 by FDA under an Emergency Use Authorization (EUA). This EUA will remain in effect (meaning this test can be used) for the duration of the COVID-19 declaration under Section 564(b)(1) of the Act, 21 U.S.C. section 360bbb-3(b)(1), unless the authorization is terminated or revoked.  Performed at [facility], [place] [address] [place], [place] [address]       Studies: No results found.  Scheduled Meds:  vitamin C  500 mg Oral Daily   aspirin EC  81 mg Oral Daily   atorvastatin  10 mg Oral Daily   Chlorhexidine Gluconate Cloth  6 each Topical Daily   donepezil  5 mg Oral QHS   folic acid  1 mg Oral Daily   LORazepam  0.25 mg Intravenous Once   losartan  25 mg Oral Daily   memantine  10 mg Oral BID   polyethylene glycol  17 g Oral Daily   sodium chloride flush  10-40 mL Intracatheter Q12H   zinc sulfate  220 mg Oral Daily    Continuous Infusions:  amiodarone 30 mg/hr (09/29/20 1423)   diltiazem (CARDIZEM) infusion 7.5 mg/hr (09/29/20 1555)   heparin 600 Units/hr (09/29/20 1147)   remdesivir 100 mg in NS 100 mL 100 mg (09/29/20 1013)     LOS: 1 day     Alma Friendly, MD [facility]  If 7PM-7AM, please contact night-coverage [email or web address] 09/29/2020, 4:53 PM

## 2020-09-29 NOTE — Progress Notes (Signed)
MD ordered a midline to be placed, as he does not want to order a PICC. Good candidate for midline. ClCr WNL. Pt is to receive Amiodarone, Cardizem, and heparin. Educated the primary RN to not use the midline to run amiodarone, as it can be very cautic to the veins. Stated she will run the heparin in midline and the amio/cardizem in the PIV.

## 2020-09-29 NOTE — Progress Notes (Signed)
   09/29/20 0112  Assess: MEWS Score  Temp 98.8 F (37.1 C)  BP 123/80  Pulse Rate (!) 116  ECG Heart Rate (!) 116  Resp 17  Level of Consciousness Alert  SpO2 95 %  O2 Device Room Air  Assess: MEWS Score  MEWS Temp 0  MEWS Systolic 0  MEWS Pulse 2  MEWS RR 0  MEWS LOC 0  MEWS Score 2  MEWS Score Color Yellow  Assess: if the MEWS score is Yellow or Red  Were vital signs taken at a resting state? Yes  Focused Assessment Change from prior assessment (see assessment flowsheet)  Early Detection of Sepsis Score *See Row Information* Low  MEWS guidelines implemented *See Row Information* Yes  Treat  MEWS Interventions Administered prn meds/treatments  Pain Scale 0-10  Pain Score 0  Take Vital Signs  Increase Vital Sign Frequency  Yellow: Q 2hr X 2 then Q 4hr X 2, if remains yellow, continue Q 4hrs  Escalate  MEWS: Escalate Yellow: discuss with charge nurse/RN and consider discussing with provider and RRT  Notify: Charge Nurse/RN  Name of Charge Nurse/RN Notified Danae Chen, RN  Date Charge Nurse/RN Notified 09/29/20  Time Charge Nurse/RN Notified 0112  Document  Patient Outcome Other (Comment) (Pt hemodynamically stable at this time.)  Progress note created (see row info) Yes

## 2020-09-29 NOTE — Plan of Care (Signed)
  Problem: Education: Goal: Knowledge of General Education information will improve Description: Including pain rating scale, medication(s)/side effects and non-pharmacologic comfort measures Outcome: Progressing   Problem: Activity: Goal: Risk for activity intolerance will decrease Outcome: Progressing   Problem: Nutrition: Goal: Adequate nutrition will be maintained Outcome: Progressing   Problem: Coping: Goal: Level of anxiety will decrease Outcome: Progressing   Problem: Elimination: Goal: Will not experience complications related to urinary retention Outcome: Progressing   Problem: Pain Managment: Goal: General experience of comfort will improve Outcome: Progressing   Problem: Safety: Goal: Ability to remain free from injury will improve Outcome: Progressing   Problem: Clinical Measurements: Goal: Ability to maintain clinical measurements within normal limits will improve Outcome: Not Progressing

## 2020-09-29 NOTE — Progress Notes (Signed)
OT Cancellation Note  Patient Details Name: [name] MRN: [id number] DOB: [birth year]   Cancelled Treatment:    Reason Eval/Treat Not Completed: Medical issues which prohibited therapy Per communication with Physical Therapy pt went back into a-fib RVR last night and continues to have uncontrolled a-fib with a resting HR of 130s, even on cardiac drips. Will follow-up another day as appropriate.  Barrie Folk OTR/L  Acute Rehab Services  [phone] office number [phone] pager number  09/29/2020, 12:36 PM

## 2020-09-29 NOTE — Progress Notes (Addendum)
[place] for heparin Indication: atrial fibrillation  Allergies  Allergen Reactions   Actonel [Risedronate Sodium] Nausea And Vomiting   Aspirin-Caffeine     Upset stomach    Codeine     REACTION: upset stomach   Sulfonamide Derivatives     REACTION: swollen tongue    Patient Measurements: Height: 5\' 3"  (160 cm) Weight: 42.5 kg (93 lb 11.1 oz) IBW/kg (Calculated) : 52.4 Heparin Dosing Weight: TBW  Vital Signs: Temp: 97.5 F (36.4 C) (07/23 0729) Temp Source: Oral (07/23 0729) BP: 112/73 (07/23 0729) Pulse Rate: 103 (07/23 0729)  Labs: Recent Labs    09/26/20 2024 09/28/20 0301 09/28/20 0804 09/28/20 2328 09/29/20 0551  HGB 12.8  --  13.2  --  13.0  HCT 39.8  --  39.5  --  39.0  PLT 166  --  160  --  170  HEPARINUNFRC  --  0.54 0.76*  --   --   CREATININE 0.91  --  0.91  --  0.87  TROPONINIHS  --   --   --  31* 28*     Estimated Creatinine Clearance: 28.8 mL/min (by C-G formula based on SCr of 0.87 mg/dL).   Medical History: Past Medical History:  Diagnosis Date   Arthritis    Atrial fibrillation ([place])    postoperative   COVID-19    CVA (cerebral vascular accident) ([place])    HTN (hypertension)    MR (mitral regurgitation)    severe   NSTEMI (non-ST elevated myocardial infarction) ([place])    Pericarditis    postoperative    Assessment: 89 YOF presenting with weakness, found to be in afib again after previous return to NSR. She has hx of pAfib and has been on Xarelto in past however is currently not on anticoagulation. CBC unremarkable. MRI 7/22 reported no bleeding. Heparin level this admission was 0.76 on 700 units/hr; therefore will start at decreased rate. Received Lovenox at 2300 7/22. Will check heparin level in 8 hours based on renal function and age.   Goal of Therapy:  Heparin level 0.3-0.7 units/ml Monitor platelets by anticoagulation protocol: Yes   Plan:  Start heparin 600 units/h for now Heparin  level in 8 hours and daily Daily CBC   Thank you for allowing pharmacy to participate in this patient's care.  Reatha Harps, PharmD PGY1 Pharmacy Resident 09/29/2020 8:29 AM Check [email or web address] for unit specific pharmacy number

## 2020-09-30 ENCOUNTER — Inpatient Hospital Stay (HOSPITAL_COMMUNITY): Payer: Medicare HMO

## 2020-09-30 ENCOUNTER — Inpatient Hospital Stay: Payer: Self-pay

## 2020-09-30 DIAGNOSIS — I4891 Unspecified atrial fibrillation: Secondary | ICD-10-CM | POA: Diagnosis not present

## 2020-09-30 DIAGNOSIS — R531 Weakness: Secondary | ICD-10-CM | POA: Diagnosis not present

## 2020-09-30 DIAGNOSIS — U071 COVID-19: Secondary | ICD-10-CM | POA: Diagnosis not present

## 2020-09-30 LAB — COMPREHENSIVE METABOLIC PANEL
ALT: 17 U/L (ref 0–44)
AST: 21 U/L (ref 15–41)
Albumin: 3 g/dL — ABNORMAL LOW (ref 3.5–5.0)
Alkaline Phosphatase: 50 U/L (ref 38–126)
Anion gap: 10 (ref 5–15)
BUN: 18 mg/dL (ref 8–23)
CO2: 22 mmol/L (ref 22–32)
Calcium: 8.9 mg/dL (ref 8.9–10.3)
Chloride: 103 mmol/L (ref 98–111)
Creatinine, Ser: 0.95 mg/dL (ref 0.44–1.00)
GFR, Estimated: 57 mL/min — ABNORMAL LOW (ref >60)
Glucose, Bld: 101 mg/dL — ABNORMAL HIGH (ref 70–99)
Potassium: 3.9 mmol/L (ref 3.5–5.1)
Sodium: 135 mmol/L (ref 135–145)
Total Bilirubin: 0.7 mg/dL (ref 0.3–1.2)
Total Protein: 5.6 g/dL — ABNORMAL LOW (ref 6.5–8.1)

## 2020-09-30 LAB — CBC WITH DIFFERENTIAL/PLATELET
Abs Immature Granulocytes: 0.02 10*3/uL (ref 0.00–0.07)
Basophils Absolute: 0 10*3/uL (ref 0.0–0.1)
Basophils Relative: 0 %
Eosinophils Absolute: 0.1 10*3/uL (ref 0.0–0.5)
Eosinophils Relative: 1 %
HCT: 37.9 % (ref 36.0–46.0)
Hemoglobin: 12.8 g/dL (ref 12.0–15.0)
Immature Granulocytes: 0 %
Lymphocytes Relative: 24 %
Lymphs Abs: 1.4 10*3/uL (ref 0.7–4.0)
MCH: 30.6 pg (ref 26.0–34.0)
MCHC: 33.8 g/dL (ref 30.0–36.0)
MCV: 90.7 fL (ref 80.0–100.0)
Monocytes Absolute: 0.5 10*3/uL (ref 0.1–1.0)
Monocytes Relative: 9 %
Neutro Abs: 3.8 10*3/uL (ref 1.7–7.7)
Neutrophils Relative %: 66 %
Platelets: 165 10*3/uL (ref 150–400)
RBC: 4.18 MIL/uL (ref 3.87–5.11)
RDW: 14.1 % (ref 11.5–15.5)
WBC: 5.8 10*3/uL (ref 4.0–10.5)
nRBC: 0 % (ref 0.0–0.2)

## 2020-09-30 LAB — FERRITIN: Ferritin: 197 ng/mL (ref 11–307)

## 2020-09-30 LAB — C-REACTIVE PROTEIN: CRP: 4 mg/dL — ABNORMAL HIGH (ref <1.0)

## 2020-09-30 LAB — D-DIMER, QUANTITATIVE: D-Dimer, Quant: 0.44 ug/mL-FEU (ref 0.00–0.50)

## 2020-09-30 LAB — HEPARIN LEVEL (UNFRACTIONATED): Heparin Unfractionated: 0.19 IU/mL — ABNORMAL LOW (ref 0.30–0.70)

## 2020-09-30 MED ORDER — SODIUM CHLORIDE 0.9% FLUSH: 10.0000 mL | INTRAVENOUS | Status: DC | PRN

## 2020-09-30 MED ORDER — WITCH HAZEL-GLYCERIN EX PADS
MEDICATED_PAD | CUTANEOUS | Status: DC | PRN
  Filled 2020-09-30 (×2): qty 100

## 2020-09-30 MED ORDER — APIXABAN 2.5 MG PO TABS
2.5000 mg | ORAL_TABLET | Freq: Two times a day (BID) | ORAL | Status: DC
  Administered 2020-09-30 – 2020-10-04 (×9): 2.5 mg via ORAL
  Filled 2020-09-30 (×9): qty 1

## 2020-09-30 MED ORDER — METOPROLOL TARTRATE 25 MG PO TABS
25.0000 mg | ORAL_TABLET | Freq: Two times a day (BID) | ORAL | Status: DC
  Administered 2020-09-30 – 2020-10-01 (×3): 25 mg via ORAL
  Filled 2020-09-30: qty 1
  Filled 2020-09-30: qty 2
  Filled 2020-09-30: qty 1

## 2020-09-30 MED ORDER — SODIUM CHLORIDE 0.9% FLUSH
10.0000 mL | Freq: Two times a day (BID) | INTRAVENOUS | Status: DC
  Administered 2020-09-30 – 2020-10-04 (×8): 10 mL

## 2020-09-30 NOTE — Progress Notes (Signed)
PT Cancellation Note  Patient Details Name: TAMECA JEREZ MRN: [id number] DOB: 1928/12/28   Cancelled Treatment:    Reason Eval/Treat Not Completed: Patient not medically ready  In communication with RN, who indicated we should hold PT as pt continues with RVR;   Will check back tomorrow;   Roney Marion, PT  Acute Rehabilitation Services Pager [phone] Office [phone]  [place] 09/30/2020, 11:23 AM

## 2020-09-30 NOTE — Progress Notes (Signed)
OT Cancellation Note  Patient Details Name: [name] MRN: [id number] DOB: October 08, 1928   Cancelled Treatment:    Reason Eval/Treat Not Completed: Medical issues which prohibited therapy (Pt continues with RVR) Will return as schedule allows and pt medically ready. Thank you.   [place], OTR/L Acute Rehab Pager: [phone] Office: [phone] 09/30/2020, 11:59 AM

## 2020-09-30 NOTE — Progress Notes (Signed)
Peripherally Inserted Central Catheter Placement  The IV Nurse has discussed with the patient and/or persons authorized to consent for the patient, the purpose of this procedure and the potential benefits and risks involved with this procedure.  The benefits include less needle sticks, lab draws from the catheter, and the patient may be discharged home with the catheter. Risks include, but not limited to, infection, bleeding, blood clot (thrombus formation), and puncture of an artery; nerve damage and irregular heartbeat and possibility to perform a PICC exchange if needed/ordered by physician.  Alternatives to this procedure were also discussed.  Bard Power PICC patient education guide, fact sheet on infection prevention and patient information card has been provided to patient /or left at bedside.    PICC Placement Documentation  PICC Double Lumen 09/30/20 PICC Right Brachial 34 cm 0 cm (Active)  Indication for Insertion or Continuance of Line Poor Vasculature-patient has had multiple peripheral attempts or PIVs lasting less than 24 hours 09/30/20 1300  Exposed Catheter (cm) 0 cm 09/30/20 1300  Site Assessment Clean;Dry;Intact 09/30/20 1300  Lumen #1 Status Flushed;Saline locked;Blood return noted 09/30/20 1300  Lumen #2 Status Flushed;Saline locked;Blood return noted 09/30/20 1300  Dressing Type Transparent;Securing device 09/30/20 1300  Dressing Status Clean;Dry;Intact 09/30/20 1300  Antimicrobial disc in place? Yes 09/30/20 1300  Safety Lock Not Applicable 22/17/98 1025  Line Care Connections checked and tightened 09/30/20 1300  Dressing Intervention New dressing 09/30/20 1300  Dressing Change Due 10/07/20 09/30/20 1300       Holley Bouche Renee 09/30/2020, 1:42 PM

## 2020-09-30 NOTE — Progress Notes (Signed)
Secure chat with Clarise Cruz RN to notify PICC is ready to use.

## 2020-09-30 NOTE — Progress Notes (Addendum)
Progress Note  Patient Name: [name] Date of Encounter: 09/30/2020  [facility] Cardiologist: [name] MD  Subjective   Feeling improved from yesterday.  Is converted to atrial flutter from atrial fibrillation.  Inpatient Medications    Scheduled Meds:  vitamin C  500 mg Oral Daily   aspirin EC  81 mg Oral Daily   atorvastatin  10 mg Oral Daily   Chlorhexidine Gluconate Cloth  6 each Topical Daily   donepezil  5 mg Oral QHS   folic acid  1 mg Oral Daily   LORazepam  0.25 mg Intravenous Once   losartan  25 mg Oral Daily   memantine  10 mg Oral BID   polyethylene glycol  17 g Oral Daily   sodium chloride flush  10-40 mL Intracatheter Q12H   zinc sulfate  220 mg Oral Daily   Continuous Infusions:  amiodarone 30 mg/hr (09/30/20 0329)   heparin 650 Units/hr (09/30/20 0319)   remdesivir 100 mg in NS 100 mL Stopped (09/29/20 1043)   PRN Meds: acetaminophen **OR** acetaminophen, docusate sodium, guaiFENesin-dextromethorphan, sodium chloride flush   Vital Signs    Vitals:   09/29/20 2351 09/30/20 0329 09/30/20 0643 09/30/20 0728  BP: (!) 117/39 121/75 122/79 124/89  Pulse: (!) 115 (!) 115 (!) 110 (!) 114  Resp: 18 18 20 18   Temp: 98.3 F (36.8 C) 98.4 F (36.9 C) 98 F (36.7 C) (!) 97.3 F (36.3 C)  TempSrc: Oral Oral Oral Oral  SpO2: 99% 94% 95% 97%  Weight:      Height:        Intake/Output Summary (Last 24 hours) at 09/30/2020 0757 Last data filed at 09/30/2020 0647 Gross per 24 hour  Intake 1294.32 ml  Output 1400 ml  Net -105.68 ml    Last 3 Weights 09/29/2020 09/28/2020 09/27/2020  Weight (lbs) 93 lb 11.1 oz 97 lb 1.6 oz 98 lb  Weight (kg) 42.5 kg 44.044 kg 44.453 kg      Telemetry   Atrial flutter-personally reviewed  ECG    None new  Physical Exam   GEN: Well nourished, well developed, in no acute distress  HEENT: normal  Neck: no JVD, carotid bruits, or masses Cardiac: tachycardic, regular; no murmurs, rubs, or gallops,no edema   Respiratory:  clear to auscultation bilaterally, normal work of breathing GI: soft, nontender, nondistended, + BS MS: no deformity or atrophy  Skin: warm and dry Neuro:  Strength and sensation are intact Psych: euthymic mood, full affect    Labs    High Sensitivity Troponin:   Recent Labs  Lab 09/28/20 2328 09/29/20 0551  TROPONINIHS 31* 28*       Chemistry Recent Labs  Lab 09/28/20 0804 09/29/20 0551 09/30/20 0545  NA 136 138 135  K 3.9 4.0 3.9  CL 105 106 103  CO2 22 25 22   GLUCOSE 105* 100* 101*  BUN 18 18 18   CREATININE 0.91 0.87 0.95  CALCIUM 9.2 9.1 8.9  PROT 6.4* 6.1* 5.6*  ALBUMIN 3.5 3.2* 3.0*  AST 24 25 21   ALT 20 21 17   ALKPHOS 47 49 50  BILITOT 1.1 0.9 0.7  GFRNONAA 60* >60 57*  ANIONGAP 9 7 10       Hematology Recent Labs  Lab 09/28/20 0804 09/29/20 0551 09/30/20 0545  WBC 6.5 4.7 5.8  RBC 4.33 4.30 4.18  HGB 13.2 13.0 12.8  HCT 39.5 39.0 37.9  MCV 91.2 90.7 90.7  MCH 30.5 30.2 30.6  MCHC  33.4 33.3 33.8  RDW 14.2 14.2 14.1  PLT 160 170 165     BNP Recent Labs  Lab 09/28/20 0804  BNP 327.7*      DDimer  Recent Labs  Lab 09/28/20 0804 09/29/20 0551 09/30/20 0545  DDIMER 0.59* 0.44 0.44      Radiology    ECHOCARDIOGRAM COMPLETE  Result Date: 09/28/2020    ECHOCARDIOGRAM REPORT   Patient Name:   MARLIE KUENNEN Date of Exam: 09/28/2020 Medical Rec #:  [id number]    Height:       63.0 in Accession #:    [id number]   Weight:       97.1 lb Date of Birth:  07/15/1928    BSA:          1.422 m Patient Age:    63 years     BP:           123/81 mmHg Patient Gender: F            HR:           69 bpm. Exam Location:  Inpatient Procedure: 2D Echo, Cardiac Doppler and Color Doppler Indications:    Atrial fibrillation  History:        Patient has prior history of Echocardiogram examinations, most                 recent 01/12/2019. Previous Myocardial Infarction and Pericardial                 Disease, Stroke, Arrythmias:Atrial Fibrillation,                  Signs/Symptoms:Murmur; Risk Factors:Hypertension.                  Mitral Valve: valve is present in the mitral position.  Sonographer:    Luisa Hart RDCS Referring Phys: [place]  1. Global hypokinesis (worse in inferolateral wall); overall moderate to severe LV dysfunction; s/p MV repair with mean gradient of 4 mmHg and trace AI; compared to 01/12/19, LV function is worse.  2. Left ventricular ejection fraction, by estimation, is 30 to 35%. The left ventricle has moderate to severely decreased function. The left ventricle demonstrates regional wall motion abnormalities (see scoring diagram/findings for description). Left ventricular diastolic parameters are indeterminate. Elevated left atrial pressure.  3. Right ventricular systolic function is normal. The right ventricular size is normal. There is mildly elevated pulmonary artery systolic pressure.  4. Left atrial size was mildly dilated.  5. The mitral valve has been repaired/replaced. Trivial mitral valve regurgitation. No evidence of mitral stenosis. There is a present in the mitral position.  6. The aortic valve is tricuspid. Aortic valve regurgitation is mild. Mild aortic valve sclerosis is present, with no evidence of aortic valve stenosis.  7. The inferior vena cava is normal in size with greater than 50% respiratory variability, suggesting right atrial pressure of 3 mmHg. FINDINGS  Left Ventricle: Left ventricular ejection fraction, by estimation, is 30 to 35%. The left ventricle has moderate to severely decreased function. The left ventricle demonstrates regional wall motion abnormalities. The left ventricular internal cavity size was normal in size. There is no left ventricular hypertrophy. Left ventricular diastolic parameters are indeterminate. Elevated left atrial pressure. Right Ventricle: The right ventricular size is normal. Right ventricular systolic function is normal. There is mildly elevated  pulmonary artery systolic pressure. The tricuspid regurgitant velocity is 3.00 m/s, and with an assumed right atrial pressure  of 3 mmHg, the estimated right ventricular systolic pressure is 62.9 mmHg. Left Atrium: Left atrial size was mildly dilated. Right Atrium: Right atrial size was normal in size. Pericardium: There is no evidence of pericardial effusion. Mitral Valve: The mitral valve has been repaired/replaced. Mild mitral annular calcification. Trivial mitral valve regurgitation. There is a present in the mitral position. No evidence of mitral valve stenosis. MV peak gradient, 9.5 mmHg. The mean mitral  valve gradient is 4.0 mmHg. Tricuspid Valve: The tricuspid valve is normal in structure. Tricuspid valve regurgitation is mild . No evidence of tricuspid stenosis. Aortic Valve: The aortic valve is tricuspid. Aortic valve regurgitation is mild. Aortic regurgitation PHT measures 410 msec. Mild aortic valve sclerosis is present, with no evidence of aortic valve stenosis. Aortic valve mean gradient measures 3.0 mmHg. Aortic valve peak gradient measures 5.7 mmHg. Aortic valve area, by VTI measures 1.95 cm. Pulmonic Valve: The pulmonic valve was normal in structure. Pulmonic valve regurgitation is mild. No evidence of pulmonic stenosis. Aorta: The aortic root is normal in size and structure. Venous: The inferior vena cava is normal in size with greater than 50% respiratory variability, suggesting right atrial pressure of 3 mmHg. IAS/Shunts: No atrial level shunt detected by color flow Doppler. Additional Comments: Global hypokinesis (worse in inferolateral wall); overall moderate to severe LV dysfunction; s/p MV repair with mean gradient of 4 mmHg and trace AI; compared to 01/12/19, LV function is worse.  LEFT VENTRICLE PLAX 2D LVIDd:         4.30 cm     Diastology LVIDs:         3.50 cm     LV e' medial:    3.78 cm/s LV PW:         1.10 cm     LV E/e' medial:  38.4 LV IVS:        1.10 cm     LV e' lateral:   6.25  cm/s LVOT diam:     2.00 cm     LV E/e' lateral: 23.2 LV SV:         49 LV SV Index:   35 LVOT Area:     3.14 cm  LV Volumes (MOD) LV vol d, MOD A2C: 58.8 ml LV vol d, MOD A4C: 46.9 ml LV vol s, MOD A2C: 38.1 ml LV vol s, MOD A4C: 43.6 ml LV SV MOD A2C:     20.7 ml LV SV MOD A4C:     46.9 ml LV SV MOD BP:      12.5 ml RIGHT VENTRICLE RV Basal diam:  3.80 cm RV Mid diam:    2.50 cm RV S prime:     9.28 cm/s TAPSE (M-mode): 1.4 cm LEFT ATRIUM             Index       RIGHT ATRIUM           Index LA Vol (A2C):   58.4 ml 41.06 ml/m RA Area:     14.80 cm LA Vol (A4C):   47.1 ml 33.11 ml/m RA Volume:   36.50 ml  25.66 ml/m LA Biplane Vol: 54.8 ml 38.53 ml/m  AORTIC VALVE                   PULMONIC VALVE AV Area (Vmax):    2.02 cm    PV Vmax:       0.97 m/s AV Area (Vmean):   1.82 cm    PV  Vmean:      66.500 cm/s AV Area (VTI):     1.95 cm    PV VTI:        0.193 m AV Vmax:           119.00 cm/s PV Peak grad:  3.8 mmHg AV Vmean:          85.000 cm/s PV Mean grad:  2.0 mmHg AV VTI:            0.253 m AV Peak Grad:      5.7 mmHg AV Mean Grad:      3.0 mmHg LVOT Vmax:         76.50 cm/s LVOT Vmean:        49.300 cm/s PULMONARY ARTERY LVOT VTI:          0.157 m     MPA diam:        2.40 cm LVOT/AV VTI ratio: 0.62 AI PHT:            410 msec  AORTA Ao Root diam: 3.20 cm Ao Asc diam:  3.10 cm MITRAL VALVE                TRICUSPID VALVE MV Area (PHT): 3.03 cm     TR Peak grad:   36.0 mmHg MV Area VTI:   1.10 cm     TR Vmax:        300.00 cm/s MV Peak grad:  9.5 mmHg MV Mean grad:  4.0 mmHg     SHUNTS MV Vmax:       1.54 m/s     Systemic VTI:  0.16 m MV Vmean:      87.6 cm/s    Systemic Diam: 2.00 cm MV Decel Time: 250 msec MR Peak grad: 43.6 mmHg MR Vmax:      330.00 cm/s MV E velocity: 145.00 cm/s MV A velocity: 88.80 cm/s MV E/A ratio:  1.63 Kirk Ruths MD Electronically signed by Kirk Ruths MD Signature Date/Time: 09/28/2020/3:31:45 PM    Final     Cardiac Studies   ECHO: 01/12/2019 IMPRESSIONS     1.  Left ventricular ejection fraction, by visual estimation, is 60 to  65%. The left ventricle has normal function. There is no left ventricular  hypertrophy.   2. Left ventricular diastolic parameters are indeterminate.   3. Inferior basal hypokinesis.   4. Global right ventricle has normal systolic function.The right  ventricular size is normal. No increase in right ventricular wall  thickness.   5. Left atrial size was normal.   6. Right atrial size was mildly dilated.   7. The mitral valve has been repaired/replaced. Trace mitral valve  regurgitation. No evidence of mitral stenosis.   8. Post MV repair with trivial residual MR.   9. The tricuspid valve is normal in structure. Tricuspid valve  regurgitation is mild.  10. The aortic valve is tricuspid. Aortic valve regurgitation is mild.  Mild to moderate aortic valve sclerosis/calcification without any evidence  of aortic stenosis.  11. The pulmonic valve was normal in structure. Pulmonic valve  regurgitation is not visualized.  12. Mildly elevated pulmonary artery systolic pressure.  13. The inferior vena cava is normal in size with greater than 50%  respiratory variability, suggesting right atrial pressure of 3 mmHg.    Patient Profile     87 y.o. female with a hx of HTN, HLD, P. Afib -not on AC (remained in NSR), h/o MV repair, Dementia who is being  seen 09/27/2020 for the evaluation of tachycardia (likley aflutter 2:1/ afib) at the request of Dr Hal Hope.  Assessment & Plan    Atrial fibrillation/atrial flutter: Was put on amiodarone yesterday.  Has converted to atrial flutter from atrial fibrillation.  We Greer Koeppen continue IV amiodarone.  Heart rates are somewhat fast.  We Kellis Topete start p.o. metoprolol to hopefully control her heart rates.  She is feeling somewhat unsettled but overall well.  If we can control her heart rates, she may be a candidate for outpatient cardioversion.  We Fidelis Loth stop heparin today and start her on Eliquis.   COVID-19 infection: Plan per primary team Hypertension: Currently well controlled Mitral valve repair: Stable on most recent echo     For questions or updates, please contact [place] Please consult [email or web address] for contact info under        Signed, Victoria Euceda Meredith Leeds, MD  09/30/2020, 7:57 AM

## 2020-09-30 NOTE — Progress Notes (Signed)
PROGRESS NOTE  [name] UXN:[id number] DOB: 1929/01/31 DOA: 09/26/2020 PCP: [name], MD  HPI/Recap of past 24 hours: [name] is a 87 y.o. female with history of hypertension, paroxysmal atrial fibrillation, mitral valve repair, history of stroke experienced some slurred speech PTA, lasted for 5 minutes, has been feeling weak with a fever of 101 F .  Due to all of this, decided to come to the ED. While waiting in the ER patient started experiencing palpitation.  Patient was found to be in a flutter with RVR, started on Cardizem infusion and heparin, cardiology was consulted. Chest x-ray UA was unremarkable COVID test positive.  Patient admitted for A. fib with RVR as well as COVID-19 infection.     Today, still in A. fib with RVR.  Denies any chest pain, shortness of breath, abdominal pain, nausea/vomiting, fever/chills.   Assessment/Plan: Principal Problem:   Atrial fibrillation with RVR (HCC) Active Problems:   Weakness   Paroxysmal A-fib (HCC)   Atrial flutter with RVR Currently in A. fib, HR uncontrolled TSH unremarkable BNP 327, troponin mildly elevated with flat trend Echo done showed global hypokinesis, with moderate to severe LV dysfunction, EF of 30 to 35%, with regional wall motion abnormality Cardiology consulted, started her on IV amiodarone, added p.o. metoprolol, started Eliquis, discontinued aspirin Telemetry  New acute systolic HF Does not appear volume overloaded Echo has noted above with EF of 30 to 35% Cardiology consulted Strict I's and O's, daily weight Telemetry  COVID-19 infection Currently afebrile, with no leukocytosis, currently saturating well on room air Trend inflammatory markers Chest x-ray unremarkable Due to advanced age, symptomatic and high risk for deterioration, started remdesivir Continue vitamins, cough suppressants, inhalers as needed Supplemental oxygen as needed Monitor closely  Hypertension Continue  losartan  History of prior CVA Noted to have slurred speech prior to arrival MRI brain done showed no acute findings Continue Lipitor, started Eliquis for A. fib Telemetry  History of mitral valve repair Outpatient follow-up  History of dementia Continue Aricept, Namenda       Estimated body mass index is 16.6 kg/m as calculated from the following:   Height as of this encounter: 5\' 3"  (1.6 m).   Weight as of this encounter: 42.5 kg.     Code Status: Full  Family Communication: None at bedside  Disposition Plan: Status is: Inpatient  Remains inpatient appropriate because:Inpatient level of care appropriate due to severity of illness  Dispo: The patient is from: Home              Anticipated d/c is to: Home              Patient currently is not medically stable to d/c.   Difficult to place patient No   Consultants: Cardiology  Procedures: None  Antimicrobials: None  DVT prophylaxis: Eliquis   Objective: Vitals:   09/29/20 2351 09/30/20 0329 09/30/20 0643 09/30/20 0728  BP: (!) 117/39 121/75 122/79 124/89  Pulse: (!) 115 (!) 115 (!) 110 (!) 114  Resp: 18 18 20 18   Temp: 98.3 F (36.8 C) 98.4 F (36.9 C) 98 F (36.7 C) (!) 97.3 F (36.3 C)  TempSrc: Oral Oral Oral Oral  SpO2: 99% 94% 95% 97%  Weight:      Height:        Intake/Output Summary (Last 24 hours) at 09/30/2020 1632 Last data filed at 09/30/2020 0647 Gross per 24 hour  Intake 1294.32 ml  Output 900 ml  Net 394.32  ml   Filed Weights   09/27/20 2101 09/28/20 0148 09/29/20 0522  Weight: 44.5 kg 44 kg 42.5 kg    Exam: General: NAD, frail Cardiovascular: S1, S2 present Respiratory: CTAB Abdomen: Soft, nontender, nondistended, bowel sounds present Musculoskeletal: No bilateral pedal edema noted Skin: Normal Psychiatry: Normal mood  Neurology: No obvious focal neurologic deficits noted, strength equal in all extremities    Data Reviewed: CBC: Recent Labs  Lab 09/26/20 2024  09/28/20 0804 09/29/20 0551 09/30/20 0545  WBC 8.3 6.5 4.7 5.8  NEUTROABS 6.4 4.3 2.7 3.8  HGB 12.8 13.2 13.0 12.8  HCT 39.8 39.5 39.0 37.9  MCV 94.3 91.2 90.7 90.7  PLT 166 160 170 423   Basic Metabolic Panel: Recent Labs  Lab 09/26/20 2024 09/28/20 0804 09/29/20 0551 09/30/20 0545  NA 134* 136 138 135  K 3.8 3.9 4.0 3.9  CL 100 105 106 103  CO2 24 22 25 22   GLUCOSE 99 105* 100* 101*  BUN 14 18 18 18   CREATININE 0.91 0.91 0.87 0.95  CALCIUM 9.6 9.2 9.1 8.9   GFR: Estimated Creatinine Clearance: 26.4 mL/min (by C-G formula based on SCr of 0.95 mg/dL). Liver Function Tests: Recent Labs  Lab 09/26/20 2024 09/28/20 0804 09/29/20 0551 09/30/20 0545  AST 25 24 25 21   ALT 18 20 21 17   ALKPHOS 54 47 49 50  BILITOT 1.2 1.1 0.9 0.7  PROT 7.2 6.4* 6.1* 5.6*  ALBUMIN 3.9 3.5 3.2* 3.0*   No results for input(s): LIPASE, AMYLASE in the last 168 hours. No results for input(s): AMMONIA in the last 168 hours. Coagulation Profile: No results for input(s): INR, PROTIME in the last 168 hours. Cardiac Enzymes: No results for input(s): CKTOTAL, CKMB, CKMBINDEX, TROPONINI in the last 168 hours. BNP (last 3 results) No results for input(s): PROBNP in the last 8760 hours. HbA1C: No results for input(s): HGBA1C in the last 72 hours. CBG: No results for input(s): GLUCAP in the last 168 hours. Lipid Profile: No results for input(s): CHOL, HDL, LDLCALC, TRIG, CHOLHDL, LDLDIRECT in the last 72 hours. Thyroid Function Tests: Recent Labs    09/27/20 2115  TSH 3.945   Anemia Panel: Recent Labs    09/29/20 0551 09/30/20 0545  FERRITIN 172 197   Urine analysis:    Component Value Date/Time   COLORURINE YELLOW 09/26/2020 2354   APPEARANCEUR CLEAR 09/26/2020 2354   LABSPEC 1.005 09/26/2020 2354   PHURINE 6.0 09/26/2020 2354   GLUCOSEU NEGATIVE 09/26/2020 2354   HGBUR SMALL (A) 09/26/2020 2354   BILIRUBINUR NEGATIVE 09/26/2020 2354   BILIRUBINUR negative 08/09/2012 1203    KETONESUR NEGATIVE 09/26/2020 2354   PROTEINUR NEGATIVE 09/26/2020 2354   UROBILINOGEN negative 08/09/2012 1203   NITRITE NEGATIVE 09/26/2020 2354   LEUKOCYTESUR NEGATIVE 09/26/2020 2354   Sepsis Labs: @LABRCNTIP (procalcitonin:4,lacticidven:4)  ) Recent Results (from the past 240 hour(s))  Resp Panel by RT-PCR (Flu A&B, Covid) Nasopharyngeal Swab     Status: Abnormal   Collection Time: 09/28/20 12:11 AM   Specimen: Nasopharyngeal Swab; Nasopharyngeal(NP) swabs in vial transport medium  Result Value Ref Range Status   SARS Coronavirus 2 by RT PCR POSITIVE (A) NEGATIVE Final    Comment: RESULT CALLED TO, READ BACK BY AND VERIFIED WITH: RN KATHY SEGEL BY MESSAN H. AT [id number] ON 7 22 2022 (NOTE) SARS-CoV-2 target nucleic acids are DETECTED.  The SARS-CoV-2 RNA is generally detectable in upper respiratory specimens during the acute phase of infection. Positive results are indicative of the presence of  the identified virus, but do not rule out bacterial infection or co-infection with other pathogens not detected by the test. Clinical correlation with patient history and other diagnostic information is necessary to determine patient infection status. The expected result is Negative.  Fact Sheet for Patients: EntrepreneurPulse.com.au  Fact Sheet for Healthcare Providers: IncredibleEmployment.be  This test is not yet approved or cleared by the [place] FDA and  has been authorized for detection and/or diagnosis of SARS-CoV-2 by FDA under an Emergency Use Authorization (EUA).  This EUA will remain in effect (meaning t his test can be used) for the duration of  the COVID-19 declaration under Section 564(b)(1) of the Act, 21 U.S.C. section 360bbb-3(b)(1), unless the authorization is terminated or revoked sooner.     Influenza A by PCR NEGATIVE NEGATIVE Final   Influenza B by PCR NEGATIVE NEGATIVE Final    Comment: (NOTE) The Xpert Xpress  SARS-CoV-2/FLU/RSV plus assay is intended as an aid in the diagnosis of influenza from Nasopharyngeal swab specimens and should not be used as a sole basis for treatment. Nasal washings and aspirates are unacceptable for Xpert Xpress SARS-CoV-2/FLU/RSV testing.  Fact Sheet for Patients: EntrepreneurPulse.com.au  Fact Sheet for Healthcare Providers: IncredibleEmployment.be  This test is not yet approved or cleared by the [place] FDA and has been authorized for detection and/or diagnosis of SARS-CoV-2 by FDA under an Emergency Use Authorization (EUA). This EUA will remain in effect (meaning this test can be used) for the duration of the COVID-19 declaration under Section 564(b)(1) of the Act, 21 U.S.C. section 360bbb-3(b)(1), unless the authorization is terminated or revoked.  Performed at [facility], [place] [address] [place], [place] [address]       Studies: DG Chest Port 1 View  Result Date: 09/30/2020 CLINICAL DATA:  Status post peripherally inserted central venous catheter (PICC) placement. EXAM: PORTABLE CHEST 1 VIEW COMPARISON:  Chest radiograph dated 09/26/2020. FINDINGS: The heart is enlarged. Vascular calcifications are seen in the aortic arch. The lungs are clear. There is no pneumothorax. Degenerative changes are seen in the spine. A right upper extremity PICC tip overlies the superior vena cava. IMPRESSION: Right upper extremity PICC with tip overlying the superior vena cava. No pneumothorax. Electronically Signed   By: Zerita Boers M.D.   On: 09/30/2020 14:54   [place] EKG SITE RITE  Result Date: 09/30/2020 If Site Rite image not attached, placement could not be confirmed due to current cardiac rhythm.   Scheduled Meds:  apixaban  2.5 mg Oral BID   vitamin C  500 mg Oral Daily   aspirin EC  81 mg Oral Daily   atorvastatin  10 mg Oral Daily   Chlorhexidine Gluconate Cloth  6 each Topical Daily   donepezil  5 mg Oral QHS    folic acid  1 mg Oral Daily   LORazepam  0.25 mg Intravenous Once   losartan  25 mg Oral Daily   memantine  10 mg Oral BID   metoprolol tartrate  25 mg Oral BID   polyethylene glycol  17 g Oral Daily   sodium chloride flush  10-40 mL Intracatheter Q12H   sodium chloride flush  10-40 mL Intracatheter Q12H   zinc sulfate  220 mg Oral Daily    Continuous Infusions:  amiodarone 30 mg/hr (09/30/20 1516)   remdesivir 100 mg in NS 100 mL 100 mg (09/30/20 1525)     LOS: 2 days     Alma Friendly, MD [facility]  If 7PM-7AM, please contact night-coverage [email or web address] 09/30/2020, 4:32 PM

## 2020-10-01 DIAGNOSIS — I4891 Unspecified atrial fibrillation: Secondary | ICD-10-CM | POA: Diagnosis not present

## 2020-10-01 DIAGNOSIS — I480 Paroxysmal atrial fibrillation: Secondary | ICD-10-CM

## 2020-10-01 DIAGNOSIS — U071 COVID-19: Secondary | ICD-10-CM | POA: Diagnosis not present

## 2020-10-01 DIAGNOSIS — R531 Weakness: Secondary | ICD-10-CM | POA: Diagnosis not present

## 2020-10-01 LAB — CBC WITH DIFFERENTIAL/PLATELET
Abs Immature Granulocytes: 0.02 10*3/uL (ref 0.00–0.07)
Basophils Absolute: 0 10*3/uL (ref 0.0–0.1)
Basophils Relative: 0 %
Eosinophils Absolute: 0.1 10*3/uL (ref 0.0–0.5)
Eosinophils Relative: 1 %
HCT: 35.8 % — ABNORMAL LOW (ref 36.0–46.0)
Hemoglobin: 12 g/dL (ref 12.0–15.0)
Immature Granulocytes: 0 %
Lymphocytes Relative: 25 %
Lymphs Abs: 1.4 10*3/uL (ref 0.7–4.0)
MCH: 30.5 pg (ref 26.0–34.0)
MCHC: 33.5 g/dL (ref 30.0–36.0)
MCV: 90.9 fL (ref 80.0–100.0)
Monocytes Absolute: 0.5 10*3/uL (ref 0.1–1.0)
Monocytes Relative: 9 %
Neutro Abs: 3.4 10*3/uL (ref 1.7–7.7)
Neutrophils Relative %: 65 %
Platelets: 173 10*3/uL (ref 150–400)
RBC: 3.94 MIL/uL (ref 3.87–5.11)
RDW: 13.9 % (ref 11.5–15.5)
WBC: 5.4 10*3/uL (ref 4.0–10.5)
nRBC: 0 % (ref 0.0–0.2)

## 2020-10-01 LAB — D-DIMER, QUANTITATIVE: D-Dimer, Quant: 0.7 ug/mL-FEU — ABNORMAL HIGH (ref 0.00–0.50)

## 2020-10-01 LAB — COMPREHENSIVE METABOLIC PANEL
ALT: 16 U/L (ref 0–44)
AST: 19 U/L (ref 15–41)
Albumin: 2.8 g/dL — ABNORMAL LOW (ref 3.5–5.0)
Alkaline Phosphatase: 51 U/L (ref 38–126)
Anion gap: 9 (ref 5–15)
BUN: 18 mg/dL (ref 8–23)
CO2: 24 mmol/L (ref 22–32)
Calcium: 8.8 mg/dL — ABNORMAL LOW (ref 8.9–10.3)
Chloride: 102 mmol/L (ref 98–111)
Creatinine, Ser: 1 mg/dL (ref 0.44–1.00)
GFR, Estimated: 54 mL/min — ABNORMAL LOW (ref >60)
Glucose, Bld: 167 mg/dL — ABNORMAL HIGH (ref 70–99)
Potassium: 3.7 mmol/L (ref 3.5–5.1)
Sodium: 135 mmol/L (ref 135–145)
Total Bilirubin: 0.7 mg/dL (ref 0.3–1.2)
Total Protein: 5.3 g/dL — ABNORMAL LOW (ref 6.5–8.1)

## 2020-10-01 LAB — C-REACTIVE PROTEIN: CRP: 6 mg/dL — ABNORMAL HIGH (ref <1.0)

## 2020-10-01 LAB — FERRITIN: Ferritin: 210 ng/mL (ref 11–307)

## 2020-10-01 MED ORDER — METOPROLOL TARTRATE 25 MG PO TABS
25.0000 mg | ORAL_TABLET | Freq: Three times a day (TID) | ORAL | Status: DC
  Administered 2020-10-01 – 2020-10-02 (×3): 25 mg via ORAL
  Filled 2020-10-01 (×3): qty 1

## 2020-10-01 NOTE — Evaluation (Signed)
 Occupational Therapy Evaluation Patient Details Name: [name] MRN: [id number] DOB: 1928-11-26 Today's Date: 10/01/2020    History of Present Illness Pt is a 87 y.o. female who presented 09/26/20 with momentary slurred speech, weakness, palpitations, and a fever. Patient admitted for A. fib with RVR as well as COVID-19 infection. MRI of brain negative for acute infarcts. PMH:hx of HTN, HLD, P. Afib -not on AC (remained in NSR), h/o MV repair, NSTEMI, percarditis, dementia, CVA.   Clinical Impression   PTA, pt's son lives with her. Pt reports Independence with ADLs, IADLs in the home and mobility with occasional use of a cane in the community. Pt received in bed, eager for opportunity for OOB tasks. Pt overall min guard at most for short mobility in room with intermittent use of IV pole for support. Pt Independent for UB ADLs and min guard for LB ADLs. Steadiness in standing improved with increased activity. Anticipate no skilled OT services needed at DC though will continue to follow acutely to improve balance, strength and endurance with daily tasks.   HR up to 128bpm, sustained 115bpm with activity O2 WFL on RA.     Follow Up Recommendations  No OT follow up    Equipment Recommendations  None recommended by OT    Recommendations for Other Services       Precautions / Restrictions Precautions Precautions: Fall;Other (comment) Precaution Comments: monitor HR Restrictions Weight Bearing Restrictions: No      Mobility Bed Mobility Overal bed mobility: Modified Independent             General bed mobility comments: HOB elevated    Transfers Overall transfer level: Needs assistance Equipment used: None Transfers: Sit to/from [employer] Transfers Sit to Stand: Supervision Stand pivot transfers: Min guard       General transfer comment: min guard for turning to toilet and exiting bathroom without AD. No assist needed to stand    Balance Overall balance  assessment: Needs assistance Sitting-balance support: No upper extremity supported;Feet supported Sitting balance-Leahy Scale: Good     Standing balance support: No upper extremity supported;During functional activity Standing balance-Leahy Scale: Good Standing balance comment: light reaching out for support initially with mobility that improved with increased activity                           ADL either performed or assessed with clinical judgement   ADL Overall ADL's : Needs assistance/impaired Eating/Feeding: Independent;Sitting   Grooming: Set up;Standing;Wash/dry face   Upper Body Bathing: Independent;Standing   Lower Body Bathing: Min guard;Sit to/from stand Lower Body Bathing Details (indicate cue type and reason): min guard for safety though no LOB when bathing at sink in standing Upper Body Dressing : Independent;Sitting   Lower Body Dressing: Min guard;Sit to/from stand   Toilet Transfer: Min guard;Ambulation;Regular Toilet;Grab bars Toilet Transfer Details (indicate cue type and reason): min guard for safety with intermittent use of IV pole Toileting- Clothing Manipulation and Hygiene: Supervision/safety;Sit to/from stand Toileting - Clothing Manipulation Details (indicate cue type and reason): for safety     Functional mobility during ADLs: Min guard General ADL Comments: Light min guard at times due to some unsteadiness with initial OOB attempts. No overt LOB and no safety concerns. HR stable     Vision Baseline Vision/History: Wears glasses Wears Glasses: At all times Patient Visual Report: No change from baseline Vision Assessment?: No apparent visual deficits     Perception  Praxis      Pertinent Vitals/Pain Pain Assessment: No/denies pain     Hand Dominance Right   Extremity/Trunk Assessment Upper Extremity Assessment Upper Extremity Assessment: Generalized weakness   Lower Extremity Assessment Lower Extremity Assessment: Defer  to PT evaluation   Cervical / Trunk Assessment Cervical / Trunk Assessment: Normal   Communication Communication Communication: No difficulties   Cognition Arousal/Alertness: Awake/alert Behavior During Therapy: WFL for tasks assessed/performed Overall Cognitive Status: Within Functional Limits for tasks assessed                                 General Comments: very pleasant, participatory   General Comments  HR briefly up to 128 but sustained around 115bpm with activity. pt denies lightheadedness or dizziness    Exercises     Shoulder Instructions      Home Living Family/patient expects to be discharged to:: Private residence Living Arrangements: Children Available Help at Discharge: Family;Available 24 hours/day (son is retired) Type of Home: House Home Access: Stairs to enter Technical  of Steps: 2 Entrance Stairs-Rails: Left;Right Home Layout: One level     Bathroom Shower/Tub: Occupational psychologist: Handicapped height     Home Equipment: [place] - single point;Shower seat;Grab bars - tub/shower;Cane - quad;Walker - 2 wheels          Prior Functioning/Environment Level of Independence: Independent        Comments: Typically Independent with ADLs, IADLs in the home (simple meals, etc) and mobility. Pt will occasionally use a cane when going to the bank or walking outside the home. Does not drive but recently had driver's license renewed. enjoys staying active        OT Problem List: Decreased strength;Impaired balance (sitting and/or standing);Decreased activity tolerance      OT Treatment/Interventions:      OT Goals(Current goals can be found in the care plan section) Acute Rehab OT Goals Patient Stated Goal: get out of bed, walk around more OT Goal Formulation: With patient Time For Goal Achievement: 10/15/20 Potential to Achieve Goals: Good  OT Frequency:     Barriers to D/C:            Co-evaluation               AM-PAC OT "6 Clicks" Daily Activity     Outcome Measure Help from another person eating meals?: None Help from another person taking care of personal grooming?: None Help from another person toileting, which includes using toliet, bedpan, or urinal?: A Little Help from another person bathing (including washing, rinsing, drying)?: A Little Help from another person to put on and taking off regular upper body clothing?: None Help from another person to put on and taking off regular lower body clothing?: A Little 6 Click Score: 21   End of Session Nurse Communication: Mobility status;Other (comment) (HR)  Activity Tolerance: Patient tolerated treatment well Patient left: Other (comment) (standing in room with PT entering for session)  OT Visit Diagnosis: Unsteadiness on feet (R26.81);Muscle weakness (generalized) (M62.81)                Time: 1610-9604 OT Time Calculation (min): 29 min Charges:  OT General Charges $OT Visit: 1 Visit OT Evaluation $OT Eval Low Complexity: 1 Low OT Treatments $Self Care/Home Management : 8-22 mins  Malachy Chamber, OTR/L Acute Rehab Services Office: [phone]   [name] 10/01/2020, 12:16 PM

## 2020-10-01 NOTE — Evaluation (Signed)
Physical Therapy Evaluation Patient Details Name: [name] MRN: [id number] DOB: Apr 01, 1928 Today's Date: 10/01/2020   History of Present Illness  Pt is a 87 y.o. female who presented 09/26/20 with momentary slurred speech, weakness, palpitations, and a fever. Patient admitted for A. fib with RVR as well as COVID-19 infection. MRI of brain negative for acute infarcts. PMH:hx of HTN, HLD, P. Afib -not on AC (remained in NSR), h/o MV repair, NSTEMI, percarditis, dementia, CVA.   Clinical Impression  Pt admitted with above diagnosis. PTA pt active and independent. On eval, she required S/minG transfers and minG ambulation in room without AD. Max sustained HR 112-113 during mobility. SpO2 stable on RA.  Pt currently with functional limitations due to the deficits listed below (see PT Problem List). Pt will benefit from skilled PT to increase their independence and safety with mobility to allow discharge to the venue listed below.       Follow Up Recommendations No PT follow up    Equipment Recommendations  None recommended by PT    Recommendations for Other Services       Precautions / Restrictions Precautions Precautions: Fall;Other (comment) Precaution Comments: monitor HR Restrictions Weight Bearing Restrictions: No      Mobility  Bed Mobility Overal bed mobility: Modified Independent             General bed mobility comments: HOB elevated    Transfers Overall transfer level: Needs assistance Equipment used: None Transfers: Sit to/from Stand;Stand Pivot Transfers Sit to Stand: Supervision Stand pivot transfers: Min guard       General transfer comment: assist for safety  Ambulation/Gait Ambulation/Gait assistance: Min guard Gait Distance (Feet): 50 Feet Assistive device: None Gait Pattern/deviations: Step-through pattern;Decreased stride length Gait velocity: WFL Gait velocity interpretation: >2.62 ft/sec, indicative of community ambulatory General Gait  Details: assist for safety, no LOB noted. Sustained HR 112-113. SpO2 stable on RA.  Stairs            Wheelchair Mobility    Modified Rankin (Stroke Patients Only)       Balance Overall balance assessment: Needs assistance Sitting-balance support: No upper extremity supported;Feet supported Sitting balance-Leahy Scale: Good     Standing balance support: No upper extremity supported;During functional activity Standing balance-Leahy Scale: Good Standing balance comment: light reaching out for support initially with mobility that improved with increased activity                             Pertinent Vitals/Pain Pain Assessment: No/denies pain    Home Living Family/patient expects to be discharged to:: Private residence Living Arrangements: Children (85 y.o. son) Available Help at Discharge: Family;Available 24 hours/day Type of Home: House Home Access: Stairs to enter Entrance Stairs-Rails: Chemical engineer of Steps: 2 Home Layout: One level Home Equipment: Cane - single point;Shower seat;Grab bars - tub/shower;Cane - quad;Walker - 2 wheels      Prior Function Level of Independence: Independent         Comments: Typically Independent with ADLs, IADLs in the home (simple meals, etc) and mobility. Pt will occasionally use a cane when going to the bank or walking outside the home. Does not drive but recently had driver's license renewed. enjoys staying active     Hand Dominance   Dominant Hand: Right    Extremity/Trunk Assessment   Upper Extremity Assessment Upper Extremity Assessment: Generalized weakness    Lower Extremity Assessment Lower Extremity Assessment: Generalized weakness  Cervical / Trunk Assessment Cervical / Trunk Assessment: Normal  Communication   Communication: No difficulties  Cognition Arousal/Alertness: Awake/alert Behavior During Therapy: WFL for tasks assessed/performed Overall Cognitive Status:  Within Functional Limits for tasks assessed                                 General Comments: very pleasant, participatory      General Comments General comments (skin integrity, edema, etc.): HR briefly up to 128 but sustained around 115bpm with activity. pt denies lightheadedness or dizziness    Exercises     Assessment/Plan    PT Assessment Patient needs continued PT services  PT Problem List Decreased strength;Decreased mobility;Decreased activity tolerance;Cardiopulmonary status limiting activity       PT Treatment Interventions Therapeutic activities;Gait training;Therapeutic exercise;Patient/family education;Balance training;Functional mobility training;Stair training    PT Goals (Current goals can be found in the Care Plan section)  Acute Rehab PT Goals Patient Stated Goal: home PT Goal Formulation: With patient Time For Goal Achievement: 10/15/20 Potential to Achieve Goals: Good    Frequency Min 3X/week   Barriers to discharge        Co-evaluation               AM-PAC PT "6 Clicks" Mobility  Outcome Measure Help needed turning from your back to your side while in a flat bed without using bedrails?: None Help needed moving from lying on your back to sitting on the side of a flat bed without using bedrails?: None Help needed moving to and from a bed to a chair (including a wheelchair)?: A Little Help needed standing up from a chair using your arms (e.g., wheelchair or bedside chair)?: A Little Help needed to walk in hospital room?: A Little Help needed climbing 3-5 steps with a railing? : A Little 6 Click Score: 20    End of Session   Activity Tolerance: Patient tolerated treatment well Patient left: in bed;with call bell/phone within reach;with bed alarm set Nurse Communication: Mobility status PT Visit Diagnosis: Muscle weakness (generalized) (M62.81)    Time: 1610-9604 PT Time Calculation (min) (ACUTE ONLY): 26 min   Charges:    PT Evaluation $PT Eval Moderate Complexity: 1 Mod PT Treatments $Gait Training: 8-22 mins        Lorrin Goodell, PT  Office # [phone] Pager [phone]   [name] 10/01/2020, 12:44 PM

## 2020-10-01 NOTE — Progress Notes (Signed)
PROGRESS NOTE  [name] [name] DOB: 1928/08/04 DOA: 09/26/2020 PCP: [name], MD  HPI/Recap of past 24 hours: [name] is a 87 y.o. female with history of hypertension, paroxysmal atrial fibrillation, mitral valve repair, history of stroke experienced some slurred speech PTA, lasted for 5 minutes, has been feeling weak with a fever of 101 F .  Due to all of this, decided to come to the ED. While waiting in the ER patient started experiencing palpitation.  Patient was found to be in a flutter with RVR, started on Cardizem infusion and heparin, cardiology was consulted. Chest x-ray UA was unremarkable COVID test positive.  Patient admitted for A. fib with RVR as well as COVID-19 infection.     Today, patient still noted to be in A. Fib, rate somewhat controlled, but goes back to RVR upon ambulation.  Sats stable on room air.  Denies any new complaints.   Assessment/Plan: Principal Problem:   Atrial fibrillation with RVR (HCC) Active Problems:   Weakness   Paroxysmal A-fib (HCC)   Atrial flutter with RVR Currently in A. fib, HR uncontrolled TSH unremarkable BNP 327, troponin mildly elevated with flat trend Echo done showed global hypokinesis, with moderate to severe LV dysfunction, EF of 30 to 35%, with regional wall motion abnormality Cardiology consulted, continue IV amiodarone, added p.o. metoprolol, started Eliquis, discontinued aspirin Telemetry  New acute systolic HF Does not appear volume overloaded Echo has noted above with EF of 30 to 35% Cardiology consulted Strict I's and O's, daily weight Telemetry  COVID-19 infection Currently afebrile, with no leukocytosis, currently saturating well on room air Trend inflammatory markers Chest x-ray unremarkable Due to advanced age, symptomatic and high risk for deterioration, started remdesivir Continue vitamins, cough suppressants, inhalers as needed Supplemental oxygen as needed Monitor  closely  Hypertension Continue losartan  History of prior CVA Noted to have slurred speech prior to arrival MRI brain done showed no acute findings Continue Lipitor, started Eliquis for A. fib Telemetry  History of mitral valve repair Outpatient follow-up  History of dementia Continue Aricept, Namenda       Estimated body mass index is 17.26 kg/m as calculated from the following:   Height as of this encounter: 5\' 3"  (1.6 m).   Weight as of this encounter: 44.2 kg.     Code Status: Full  Family Communication: None at bedside  Disposition Plan: Status is: Inpatient  Remains inpatient appropriate because:Inpatient level of care appropriate due to severity of illness  Dispo: The patient is from: Home              Anticipated d/c is to: Home              Patient currently is not medically stable to d/c.   Difficult to place patient No   Consultants: Cardiology  Procedures: None  Antimicrobials: None  DVT prophylaxis: Eliquis   Objective: Vitals:   09/30/20 0728 09/30/20 2100 10/01/20 0355 10/01/20 1000  BP: 124/89 126/77 113/84 129/71  Pulse: (!) 114 (!) 131 92 67  Resp: 18 18 18    Temp: (!) 97.3 F (36.3 C) 97.8 F (36.6 C) 98.2 F (36.8 C) 97.9 F (36.6 C)  TempSrc: Oral Oral Oral Oral  SpO2: 97% 99% 100% 95%  Weight:   44.2 kg   Height:        Intake/Output Summary (Last 24 hours) at 10/01/2020 1640 Last data filed at 10/01/2020 1416 Gross per 24 hour  Intake 130 ml  Output 1000 ml  Net -870 ml   Filed Weights   09/28/20 0148 09/29/20 0522 10/01/20 0355  Weight: 44 kg 42.5 kg 44.2 kg    Exam: General: NAD, frail Cardiovascular: S1, S2 present Respiratory: CTAB Abdomen: Soft, nontender, nondistended, bowel sounds present Musculoskeletal: No bilateral pedal edema noted Skin: Normal Psychiatry: Normal mood  Neurology: No obvious focal neurologic deficits noted, strength equal in all extremities    Data Reviewed: CBC: Recent  Labs  Lab 09/26/20 2024 09/28/20 0804 09/29/20 0551 09/30/20 0545 10/01/20 0500  WBC 8.3 6.5 4.7 5.8 5.4  NEUTROABS 6.4 4.3 2.7 3.8 3.4  HGB 12.8 13.2 13.0 12.8 12.0  HCT 39.8 39.5 39.0 37.9 35.8*  MCV 94.3 91.2 90.7 90.7 90.9  PLT 166 160 170 165 093   Basic Metabolic Panel: Recent Labs  Lab 09/26/20 2024 09/28/20 0804 09/29/20 0551 09/30/20 0545 10/01/20 0500  NA 134* 136 138 135 135  K 3.8 3.9 4.0 3.9 3.7  CL 100 105 106 103 102  CO2 24 22 25 22 24   GLUCOSE 99 105* 100* 101* 167*  BUN 14 18 18 18 18   CREATININE 0.91 0.91 0.87 0.95 1.00  CALCIUM 9.6 9.2 9.1 8.9 8.8*   GFR: Estimated Creatinine Clearance: 26.1 mL/min (by C-G formula based on SCr of 1 mg/dL). Liver Function Tests: Recent Labs  Lab 09/26/20 2024 09/28/20 0804 09/29/20 0551 09/30/20 0545 10/01/20 0500  AST 25 24 25 21 19   ALT 18 20 21 17 16   ALKPHOS 54 47 49 50 51  BILITOT 1.2 1.1 0.9 0.7 0.7  PROT 7.2 6.4* 6.1* 5.6* 5.3*  ALBUMIN 3.9 3.5 3.2* 3.0* 2.8*   No results for input(s): LIPASE, AMYLASE in the last 168 hours. No results for input(s): AMMONIA in the last 168 hours. Coagulation Profile: No results for input(s): INR, PROTIME in the last 168 hours. Cardiac Enzymes: No results for input(s): CKTOTAL, CKMB, CKMBINDEX, TROPONINI in the last 168 hours. BNP (last 3 results) No results for input(s): PROBNP in the last 8760 hours. HbA1C: No results for input(s): HGBA1C in the last 72 hours. CBG: No results for input(s): GLUCAP in the last 168 hours. Lipid Profile: No results for input(s): CHOL, HDL, LDLCALC, TRIG, CHOLHDL, LDLDIRECT in the last 72 hours. Thyroid Function Tests: No results for input(s): TSH, T4TOTAL, FREET4, T3FREE, THYROIDAB in the last 72 hours.  Anemia Panel: Recent Labs    09/30/20 0545 10/01/20 0500  FERRITIN 197 210   Urine analysis:    Component Value Date/Time   COLORURINE YELLOW 09/26/2020 2354   APPEARANCEUR CLEAR 09/26/2020 2354   LABSPEC 1.005  09/26/2020 2354   PHURINE 6.0 09/26/2020 2354   GLUCOSEU NEGATIVE 09/26/2020 2354   HGBUR SMALL (A) 09/26/2020 2354   BILIRUBINUR NEGATIVE 09/26/2020 2354   BILIRUBINUR negative 08/09/2012 1203   KETONESUR NEGATIVE 09/26/2020 2354   PROTEINUR NEGATIVE 09/26/2020 2354   UROBILINOGEN negative 08/09/2012 1203   NITRITE NEGATIVE 09/26/2020 2354   LEUKOCYTESUR NEGATIVE 09/26/2020 2354   Sepsis Labs: @LABRCNTIP (procalcitonin:4,lacticidven:4)  ) Recent Results (from the past 240 hour(s))  Resp Panel by RT-PCR (Flu A&B, Covid) Nasopharyngeal Swab     Status: Abnormal   Collection Time: 09/28/20 12:11 AM   Specimen: Nasopharyngeal Swab; Nasopharyngeal(NP) swabs in vial transport medium  Result Value Ref Range Status   SARS Coronavirus 2 by RT PCR POSITIVE (A) NEGATIVE Final    Comment: RESULT CALLED TO, READ BACK BY AND VERIFIED WITH: RN KATHY SEGEL BY MESSAN H. AT [place] ON  7 22 2022 (NOTE) SARS-CoV-2 target nucleic acids are DETECTED.  The SARS-CoV-2 RNA is generally detectable in upper respiratory specimens during the acute phase of infection. Positive results are indicative of the presence of the identified virus, but do not rule out bacterial infection or co-infection with other pathogens not detected by the test. Clinical correlation with patient history and other diagnostic information is necessary to determine patient infection status. The expected result is Negative.  Fact Sheet for Patients: EntrepreneurPulse.com.au  Fact Sheet for Healthcare Providers: IncredibleEmployment.be  This test is not yet approved or cleared by the [place] FDA and  has been authorized for detection and/or diagnosis of SARS-CoV-2 by FDA under an Emergency Use Authorization (EUA).  This EUA will remain in effect (meaning t his test can be used) for the duration of  the COVID-19 declaration under Section 564(b)(1) of the Act, 21 U.S.C. section  360bbb-3(b)(1), unless the authorization is terminated or revoked sooner.     Influenza A by PCR NEGATIVE NEGATIVE Final   Influenza B by PCR NEGATIVE NEGATIVE Final    Comment: (NOTE) The Xpert Xpress SARS-CoV-2/FLU/RSV plus assay is intended as an aid in the diagnosis of influenza from Nasopharyngeal swab specimens and should not be used as a sole basis for treatment. Nasal washings and aspirates are unacceptable for Xpert Xpress SARS-CoV-2/FLU/RSV testing.  Fact Sheet for Patients: EntrepreneurPulse.com.au  Fact Sheet for Healthcare Providers: IncredibleEmployment.be  This test is not yet approved or cleared by the [place] FDA and has been authorized for detection and/or diagnosis of SARS-CoV-2 by FDA under an Emergency Use Authorization (EUA). This EUA will remain in effect (meaning this test can be used) for the duration of the COVID-19 declaration under Section 564(b)(1) of the Act, 21 U.S.C. section 360bbb-3(b)(1), unless the authorization is terminated or revoked.  Performed at [facility], [place] [address] [place],  [address]       Studies: No results found.  Scheduled Meds:  apixaban  2.5 mg Oral BID   vitamin C  500 mg Oral Daily   atorvastatin  10 mg Oral Daily   Chlorhexidine Gluconate Cloth  6 each Topical Daily   donepezil  5 mg Oral QHS   folic acid  1 mg Oral Daily   losartan  25 mg Oral Daily   memantine  10 mg Oral BID   metoprolol tartrate  25 mg Oral TID   polyethylene glycol  17 g Oral Daily   sodium chloride flush  10-40 mL Intracatheter Q12H   sodium chloride flush  10-40 mL Intracatheter Q12H   zinc sulfate  220 mg Oral Daily    Continuous Infusions:  amiodarone 30 mg/hr (10/01/20 1156)   remdesivir 100 mg in NS 100 mL Stopped (10/01/20 1120)     LOS: 3 days     Alma Friendly, MD [facility]  If 7PM-7AM, please contact night-coverage [email or web address] 10/01/2020,  4:40 PM

## 2020-10-01 NOTE — Progress Notes (Addendum)
The patient has been seen in conjunction with Almyra Deforest, PAC. All aspects of care have been considered and discussed. The patient has been personally interviewed, examined, and all clinical data has been reviewed.  Continue IV amiodarone loading Agree with starting anticoagulation with [facility] greater than 2 and persistent/recurrent A. fib.   Progress Note  Patient Name: [name] Date of Encounter: 10/01/2020  [facility] Cardiologist: [name], MD   Subjective   Denies any CP or SOB.   Inpatient Medications    Scheduled Meds:  apixaban  2.5 mg Oral BID   vitamin C  500 mg Oral Daily   atorvastatin  10 mg Oral Daily   Chlorhexidine Gluconate Cloth  6 each Topical Daily   donepezil  5 mg Oral QHS   folic acid  1 mg Oral Daily   losartan  25 mg Oral Daily   memantine  10 mg Oral BID   metoprolol tartrate  25 mg Oral BID   polyethylene glycol  17 g Oral Daily   sodium chloride flush  10-40 mL Intracatheter Q12H   sodium chloride flush  10-40 mL Intracatheter Q12H   zinc sulfate  220 mg Oral Daily   Continuous Infusions:  amiodarone 30 mg/hr (09/30/20 2111)   remdesivir 100 mg in NS 100 mL 100 mg (10/01/20 1037)   PRN Meds: acetaminophen **OR** acetaminophen, docusate sodium, guaiFENesin-dextromethorphan, sodium chloride flush, sodium chloride flush, witch hazel-glycerin   Vital Signs    Vitals:   09/30/20 0728 09/30/20 2100 10/01/20 0355 10/01/20 1000  BP: 124/89 126/77 113/84 129/71  Pulse: (!) 114 (!) 131 92 67  Resp: 18 18 18    Temp: (!) 97.3 F (36.3 C) 97.8 F (36.6 C) 98.2 F (36.8 C) 97.9 F (36.6 C)  TempSrc: Oral Oral Oral Oral  SpO2: 97% 99% 100% 95%  Weight:   44.2 kg   Height:        Intake/Output Summary (Last 24 hours) at 10/01/2020 1111 Last data filed at 10/01/2020 1050 Gross per 24 hour  Intake 130 ml  Output 800 ml  Net -670 ml   Last 3 Weights 10/01/2020 09/29/2020 09/28/2020  Weight (lbs) 97 lb 7.1 oz 93 lb 11.1 oz 97  lb 1.6 oz  Weight (kg) 44.2 kg 42.5 kg 44.044 kg      Telemetry    Converted out of afib after midnight this morning, has since went back into afib around 11AM.  - Personally Reviewed  ECG    Atrial flutter with RVR 7/22 - Personally Reviewed  Physical Exam   GEN: No acute distress.   Neck: No JVD Cardiac: irregular HR, no murmurs, rubs, or gallops.  Respiratory: Clear to auscultation bilaterally. GI: Soft, nontender, non-distended  MS: No edema; No deformity. Neuro:  Nonfocal  Psych: Normal affect   Labs    High Sensitivity Troponin:   Recent Labs  Lab 09/28/20 2328 09/29/20 0551  TROPONINIHS 31* 28*      Chemistry Recent Labs  Lab 09/29/20 0551 09/30/20 0545 10/01/20 0500  NA 138 135 135  K 4.0 3.9 3.7  CL 106 103 102  CO2 25 22 24   GLUCOSE 100* 101* 167*  BUN 18 18 18   CREATININE 0.87 0.95 1.00  CALCIUM 9.1 8.9 8.8*  PROT 6.1* 5.6* 5.3*  ALBUMIN 3.2* 3.0* 2.8*  AST 25 21 19   ALT 21 17 16   ALKPHOS 49 50 51  BILITOT 0.9 0.7 0.7  GFRNONAA >60 57* 54*  ANIONGAP  7 10 9      Hematology Recent Labs  Lab 09/29/20 0551 09/30/20 0545 10/01/20 0500  WBC 4.7 5.8 5.4  RBC 4.30 4.18 3.94  HGB 13.0 12.8 12.0  HCT 39.0 37.9 35.8*  MCV 90.7 90.7 90.9  MCH 30.2 30.6 30.5  MCHC 33.3 33.8 33.5  RDW 14.2 14.1 13.9  PLT 170 165 173    BNP Recent Labs  Lab 09/28/20 0804  BNP 327.7*     DDimer  Recent Labs  Lab 09/29/20 0551 09/30/20 0545 10/01/20 0500  DDIMER 0.44 0.44 0.70*     Radiology    DG Chest Port 1 View  Result Date: 09/30/2020 CLINICAL DATA:  Status post peripherally inserted central venous catheter (PICC) placement. EXAM: PORTABLE CHEST 1 VIEW COMPARISON:  Chest radiograph dated 09/26/2020. FINDINGS: The heart is enlarged. Vascular calcifications are seen in the aortic arch. The lungs are clear. There is no pneumothorax. Degenerative changes are seen in the spine. A right upper extremity PICC tip overlies the superior vena cava.  IMPRESSION: Right upper extremity PICC with tip overlying the superior vena cava. No pneumothorax. Electronically Signed   By: Zerita Boers M.D.   On: 09/30/2020 14:54   [place] EKG SITE RITE  Result Date: 09/30/2020 If Site Rite image not attached, placement could not be confirmed due to current cardiac rhythm.   Cardiac Studies   Echo 09/28/2020 1. Global hypokinesis (worse in inferolateral wall); overall moderate to  severe LV dysfunction; s/p MV repair with mean gradient of 4 mmHg and  trace AI; compared to 01/12/19, LV function is worse.   2. Left ventricular ejection fraction, by estimation, is 30 to 35%. The  left ventricle has moderate to severely decreased function. The left  ventricle demonstrates regional wall motion abnormalities (see scoring  diagram/findings for description). Left  ventricular diastolic parameters are indeterminate. Elevated left atrial  pressure.   3. Right ventricular systolic function is normal. The right ventricular  size is normal. There is mildly elevated pulmonary artery systolic  pressure.   4. Left atrial size was mildly dilated.   5. The mitral valve has been repaired/replaced. Trivial mitral valve  regurgitation. No evidence of mitral stenosis. There is a present in the  mitral position.   6. The aortic valve is tricuspid. Aortic valve regurgitation is mild.  Mild aortic valve sclerosis is present, with no evidence of aortic valve  stenosis.   7. The inferior vena cava is normal in size with greater than 50%  respiratory variability, suggesting right atrial pressure of 3 mmHg.   Patient Profile     87 y.o. female with PMH of HTN, HLD, PAF not on AC (remained in NSR), h/o MV repair, and dementia presented on 09/27/2020 with weakness, fever and tachycardia. Had both atrial fibrillation and atrial flutter during this admission.   Assessment & Plan    Atrial fibrillation / atrial flutter  - due to recurrent afib during this admission, started on  Eliquis. IV amiodarone and metoprolol  - converted out of afib again around 12:16AM today, however back in afib around 11:00AM. Will increase metoprolol tartrate to 25mg  TID dosing, not a lot of BP room to increase medication. She had 24 hours of IV amiodarone, consider switch amiodarone to PO, 400mg  BID for 1 week, 200mg  BID for 1 month, then 200mg  daily thereafter.   LV dysfunction:  - Echo 01/12/2019 EF 60-65%, inferior basal hypokinesis  - Echo 09/28/2020 EF 30-35%, global hypokinesis worse in the inferolateral wall, s/p  MV repair with mean gradient 4 mmHg, mild AI  - Continue losartan, plan to consolidate metoprolol tartrate to metoprolol succinate once HR is under better control. Hopefully EF will improve on repeat echo in 3 month.   COVID 19 infection: per primary team  HTN: continue losartan, increase metoprolol tartrate to 25mg  TID dosing.   Mitral valve repair: stable on recent echo      For questions or updates, please contact [place] Please consult [email or web address] for contact info under        Signed, Almyra Deforest, [place]  10/01/2020, 11:11 AM

## 2020-10-02 DIAGNOSIS — I4891 Unspecified atrial fibrillation: Secondary | ICD-10-CM | POA: Diagnosis not present

## 2020-10-02 DIAGNOSIS — Z79899 Other long term (current) drug therapy: Secondary | ICD-10-CM

## 2020-10-02 DIAGNOSIS — Z7901 Long term (current) use of anticoagulants: Secondary | ICD-10-CM

## 2020-10-02 DIAGNOSIS — U071 COVID-19: Secondary | ICD-10-CM | POA: Diagnosis not present

## 2020-10-02 DIAGNOSIS — R531 Weakness: Secondary | ICD-10-CM | POA: Diagnosis not present

## 2020-10-02 LAB — COMPREHENSIVE METABOLIC PANEL
ALT: 17 U/L (ref 0–44)
AST: 19 U/L (ref 15–41)
Albumin: 2.8 g/dL — ABNORMAL LOW (ref 3.5–5.0)
Alkaline Phosphatase: 52 U/L (ref 38–126)
Anion gap: 7 (ref 5–15)
BUN: 21 mg/dL (ref 8–23)
CO2: 25 mmol/L (ref 22–32)
Calcium: 8.8 mg/dL — ABNORMAL LOW (ref 8.9–10.3)
Chloride: 104 mmol/L (ref 98–111)
Creatinine, Ser: 0.98 mg/dL (ref 0.44–1.00)
GFR, Estimated: 55 mL/min — ABNORMAL LOW (ref >60)
Glucose, Bld: 91 mg/dL (ref 70–99)
Potassium: 3.6 mmol/L (ref 3.5–5.1)
Sodium: 136 mmol/L (ref 135–145)
Total Bilirubin: 0.7 mg/dL (ref 0.3–1.2)
Total Protein: 5.4 g/dL — ABNORMAL LOW (ref 6.5–8.1)

## 2020-10-02 MED ORDER — AMIODARONE HCL 200 MG PO TABS
400.0000 mg | ORAL_TABLET | Freq: Two times a day (BID) | ORAL | Status: DC
  Administered 2020-10-02 – 2020-10-04 (×5): 400 mg via ORAL
  Filled 2020-10-02 (×5): qty 2

## 2020-10-02 MED ORDER — METOPROLOL SUCCINATE ER 25 MG PO TB24
25.0000 mg | ORAL_TABLET | Freq: Two times a day (BID) | ORAL | Status: DC
  Administered 2020-10-02 – 2020-10-03 (×2): 25 mg via ORAL
  Filled 2020-10-02 (×2): qty 1

## 2020-10-02 NOTE — Discharge Instructions (Addendum)
Information on my medicine - ELIQUIS (apixaban)  Why was Eliquis prescribed for you? Eliquis was prescribed for you to reduce the risk of forming blood clots that can cause a stroke if you have a medical condition called atrial fibrillation (a type of irregular heartbeat) OR to reduce the risk of a blood clots forming after orthopedic surgery.  What do You need to know about Eliquis ? Take your Eliquis TWICE DAILY - one tablet in the morning and one tablet in the evening with or without food.  It would be best to take the doses about the same time each day.  If you have difficulty swallowing the tablet whole please discuss with your pharmacist how to take the medication safely.  Take Eliquis exactly as prescribed by your doctor and DO NOT stop taking Eliquis without talking to the doctor who prescribed the medication.  Stopping may increase your risk of developing a new clot or stroke.  Refill your prescription before you run out.  After discharge, you should have regular check-up appointments with your healthcare provider that is prescribing your Eliquis.  In the future your dose may need to be changed if your kidney function or weight changes by a significant amount or as you get older.  What do you do if you miss a dose? If you miss a dose, take it as soon as you remember on the same day and resume taking twice daily.  Do not take more than one dose of ELIQUIS at the same time.  Important Safety Information A possible side effect of Eliquis is bleeding. You should call your healthcare provider right away if you experience any of the following: Bleeding from an injury or your nose that does not stop. Unusual colored urine (red or dark brown) or unusual colored stools (red or black). Unusual bruising for unknown reasons. A serious fall or if you hit your head (even if there is no bleeding).  Some medicines may interact with Eliquis and might increase your risk of bleeding or  clotting while on Eliquis. To help avoid this, consult your healthcare provider or pharmacist prior to using any new prescription or non-prescription medications, including herbals, vitamins, non-steroidal anti-inflammatory drugs (NSAIDs) and supplements.  This website has more information on Eliquis (apixaban): [email or web address].   Please Quarantine for total of 10 days from 09/28/2020.

## 2020-10-02 NOTE — Progress Notes (Signed)
Physical Therapy Treatment Patient Details Name: [name] MRN: [id number] DOB: 03-02-1929 Today's Date: 10/02/2020    History of Present Illness Pt is a 87 y.o. female who presented 09/26/20 with momentary slurred speech, weakness, palpitations, and a fever. Patient admitted for A. fib with RVR as well as COVID-19 infection. MRI of brain negative for acute infarcts. PMH: HTN, HLD, P. Afib -not on AC (remained in NSR), h/o MV repair, NSTEMI, percarditis, dementia, CVA.    PT Comments    Pt received in supine, agreeable to therapy session and with good participation and tolerance for gait and standing balance activity in room, distance limited due to fatigue. Emphasis on activity pacing, safety with transfers/gait and use of call bell for OOB mobility due to high fall risk, importance of using DME when performing mobility tasks on her own (once discharged and with nursing staff also recommended due to decreased balance this date) and importance of OOB mobility when staff present. Pt agreeable to sit up in chair for dinner, chair alarm on with green light present. Pt continues to benefit from PT services to progress toward functional mobility goals. May consider HHPT upon discharge due to pt noted balance deficit this session and decreased endurance from baseline, will continue to assess pending acute progress.  Follow Up Recommendations  Other (comment) (may consider HHPT pending acute progress; below functional baseline)     Equipment Recommendations  Other (comment) (pt has DME but needs encouragement to use it)    Recommendations for Other Services       Precautions / Restrictions Precautions Precautions: Fall;Other (comment) Precaution Comments: monitor HR Restrictions Weight Bearing Restrictions: No    Mobility  Bed Mobility Overal bed mobility: Modified Independent      General bed mobility comments: HOB elevated    Transfers Overall transfer level: Needs  assistance Equipment used: None Transfers: Sit to/from Stand Sit to Stand: Supervision         General transfer comment: from EOB and toilet heights, no AD  Ambulation/Gait Ambulation/Gait assistance: Min guard;Min assist Gait Distance (Feet): 20 Feet (16ft to bathroom, then 67ft to chair by window) Assistive device: None Gait Pattern/deviations: Step-through pattern;Decreased stride length     General Gait Details: assist for safety, minor LOB to R during second trial but able to correct with minA/furniture support. HR 100-110 bpm with exertion, Spo2 WNL on RA. BP 130/110 seated EOB taken in L forearm and no dizziness reported with transfers      Balance Overall balance assessment: Needs assistance Sitting-balance support: No upper extremity supported;Feet supported Sitting balance-Leahy Scale: Good     Standing balance support: No upper extremity supported;During functional activity Standing balance-Leahy Scale: Poor Standing balance comment: minor LOB to R with gait trial, able to correct with minA, otherwise min guard as she c/o feeling "shaky"             Cognition Arousal/Alertness: Awake/alert Behavior During Therapy: WFL for tasks assessed/performed Overall Cognitive Status: Within Functional Limits for tasks assessed              General Comments: very pleasant, participatory; some decreased awareness of deficits         General Comments General comments (skin integrity, edema, etc.): see gait section for details, HR slightly elevated and DBP elevated, placed in flowsheet for RN      Pertinent Vitals/Pain Pain Assessment: No/denies pain     PT Goals (current goals can now be found in the care plan section) Acute Rehab  PT Goals Patient Stated Goal: get out of bed, walk around more PT Goal Formulation: With patient Time For Goal Achievement: 10/15/20 Progress towards PT goals: Progressing toward goals    Frequency    Min 3X/week      PT  Plan Discharge plan needs to be updated       AM-PAC PT "6 Clicks" Mobility   Outcome Measure  Help needed turning from your back to your side while in a flat bed without using bedrails?: None Help needed moving from lying on your back to sitting on the side of a flat bed without using bedrails?: None Help needed moving to and from a bed to a chair (including a wheelchair)?: A Little Help needed standing up from a chair using your arms (e.g., wheelchair or bedside chair)?: A Little Help needed to walk in hospital room?: A Little Help needed climbing 3-5 steps with a railing? : A Little 6 Click Score: 20    End of Session Equipment Utilized During Treatment: Gait belt Activity Tolerance: Patient tolerated treatment well;Patient limited by fatigue Patient left: in chair;with call bell/phone within reach;with chair alarm set;Other (comment) (set up to eat dinner) Nurse Communication: Mobility status PT Visit Diagnosis: Muscle weakness (generalized) (M62.81)     Time: 3361-2244 PT Time Calculation (min) (ACUTE ONLY): 31 min  Charges:  $Gait Training: 8-22 mins $Therapeutic Activity: 8-22 mins                     Kenly Xiao P., PTA Acute Rehabilitation Services Pager: [phone] Office: [place] 10/02/2020, 6:11 PM

## 2020-10-02 NOTE — Progress Notes (Signed)
Progress Note  Patient Name: [name] Date of Encounter: 10/02/2020  [facility] Cardiologist: [name], MD   Subjective   Persisting atrial fibrillation.  Predominantly lying in bed.  Inpatient Medications    Scheduled Meds:  apixaban  2.5 mg Oral BID   vitamin C  500 mg Oral Daily   atorvastatin  10 mg Oral Daily   Chlorhexidine Gluconate Cloth  6 each Topical Daily   donepezil  5 mg Oral QHS   folic acid  1 mg Oral Daily   losartan  25 mg Oral Daily   memantine  10 mg Oral BID   metoprolol tartrate  25 mg Oral TID   polyethylene glycol  17 g Oral Daily   sodium chloride flush  10-40 mL Intracatheter Q12H   sodium chloride flush  10-40 mL Intracatheter Q12H   zinc sulfate  220 mg Oral Daily   Continuous Infusions:  amiodarone 30 mg/hr (10/02/20 0127)   remdesivir 100 mg in NS 100 mL Stopped (10/01/20 1120)   PRN Meds: acetaminophen **OR** acetaminophen, docusate sodium, guaiFENesin-dextromethorphan, sodium chloride flush, sodium chloride flush, witch hazel-glycerin   Vital Signs    Vitals:   10/01/20 1000 10/01/20 1846 10/01/20 2043 10/02/20 0422  BP: 129/71 117/68 113/70 119/63  Pulse: 67 (!) 102 (!) 110 100  Resp:   17 17  Temp: 97.9 F (36.6 C) 97.8 F (36.6 C) 97.7 F (36.5 C) 97.6 F (36.4 C)  TempSrc: Oral Oral Oral Oral  SpO2: 95%  96% 96%  Weight:    43.2 kg  Height:        Intake/Output Summary (Last 24 hours) at 10/02/2020 0839 Last data filed at 10/02/2020 0429 Gross per 24 hour  Intake 490 ml  Output 500 ml  Net -10 ml   Last 3 Weights 10/02/2020 10/01/2020 09/29/2020  Weight (lbs) 95 lb 3.8 oz 97 lb 7.1 oz 93 lb 11.1 oz  Weight (kg) 43.2 kg 44.2 kg 42.5 kg      Telemetry    Some A. fib and a flutter.  Spent approximately 10 hours in A. fib a flutter yesterday.  Sinus rhythm since about 10 PM last night.  Frequent PACs.- Personally Reviewed  ECG    No new tracing- Personally Reviewed  Physical Exam  Not  examined   Labs    High Sensitivity Troponin:   Recent Labs  Lab 09/28/20 2328 09/29/20 0551  TROPONINIHS 31* 28*      Chemistry Recent Labs  Lab 09/30/20 0545 10/01/20 0500 10/02/20 0500  NA 135 135 136  K 3.9 3.7 3.6  CL 103 102 104  CO2 22 24 25   GLUCOSE 101* 167* 91  BUN 18 18 21   CREATININE 0.95 1.00 0.98  CALCIUM 8.9 8.8* 8.8*  PROT 5.6* 5.3* 5.4*  ALBUMIN 3.0* 2.8* 2.8*  AST 21 19 19   ALT 17 16 17   ALKPHOS 50 51 52  BILITOT 0.7 0.7 0.7  GFRNONAA 57* 54* 55*  ANIONGAP 10 9 7      Hematology Recent Labs  Lab 09/29/20 0551 09/30/20 0545 10/01/20 0500  WBC 4.7 5.8 5.4  RBC 4.30 4.18 3.94  HGB 13.0 12.8 12.0  HCT 39.0 37.9 35.8*  MCV 90.7 90.7 90.9  MCH 30.2 30.6 30.5  MCHC 33.3 33.8 33.5  RDW 14.2 14.1 13.9  PLT 170 165 173    BNP Recent Labs  Lab 09/28/20 0804  BNP 327.7*     DDimer  Recent Labs  Lab  09/29/20 0551 09/30/20 0545 10/01/20 0500  DDIMER 0.44 0.44 0.70*     Radiology    DG Chest Port 1 View  Result Date: 09/30/2020 CLINICAL DATA:  Status post peripherally inserted central venous catheter (PICC) placement. EXAM: PORTABLE CHEST 1 VIEW COMPARISON:  Chest radiograph dated 09/26/2020. FINDINGS: The heart is enlarged. Vascular calcifications are seen in the aortic arch. The lungs are clear. There is no pneumothorax. Degenerative changes are seen in the spine. A right upper extremity PICC tip overlies the superior vena cava. IMPRESSION: Right upper extremity PICC with tip overlying the superior vena cava. No pneumothorax. Electronically Signed   By: Zerita Boers M.D.   On: 09/30/2020 14:54   [place] EKG SITE RITE  Result Date: 09/30/2020 If Site Rite image not attached, placement could not be confirmed due to current cardiac rhythm.   Cardiac Studies  Echocardiographic EF is 35%.  Patient Profile     87 y.o. female with HTN, HLD, PAF not on AC (remained in NSR), h/o MV repair, and dementia presented on 09/27/2020 with weakness,  fever and tachycardia and also found to be COVID-19 positive.Both atrial fibrillation and atrial flutter during this admission.  Assessment & Plan    Persistent/paroxysmal atrial fibrillation: Amiodarone being used to control rhythm if possible.  Still being given intravenously.  Blood pressures are relatively low for age.  Further increase in beta-blocker therapy is probably not advisable.  Plan is to continue amiodarone and hopefully with loading persistent atrial fibrillation becomes less prevalent.  When she is taking oral therapy/eating well, can switch to amiodarone 400 mg twice daily for 5 additional days then 200 mg twice daily for 2 weeks.  Once she is in sinus rhythm more consistently, beta-blocker dose will need to be decreased to prevent excessive bradycardia. Systolic heart failure: Heart failure regimen should include beta-blocker/ARB/SGLT2/MRA.  These can be added as OP as tolerated by blood pressure once COVID infection has resolved.  We should switch metoprolol to tartrate to metoprolol succinate.  Overall guarded prognosis given age and other comorbidities.     For questions or updates, please contact [place] Please consult [email or web address] for contact info under        Signed, Sinclair Grooms, MD  10/02/2020, 8:39 AM

## 2020-10-02 NOTE — Progress Notes (Signed)
PROGRESS NOTE  [name] WRU:[id number] DOB: 10-10-1928 DOA: 09/26/2020 PCP: [name], MD  HPI/Recap of past 24 hours: [name] is a 87 y.o. female with history of hypertension, paroxysmal atrial fibrillation, mitral valve repair, history of stroke experienced some slurred speech PTA, lasted for 5 minutes, has been feeling weak with a fever of 101 F .  Due to all of this, decided to come to the ED. While waiting in the ER patient started experiencing palpitation.  Patient was found to be in a flutter with RVR, started on Cardizem infusion and heparin, cardiology was consulted. Chest x-ray UA was unremarkable COVID test positive.  Patient admitted for A. fib with RVR as well as COVID-19 infection.     Today, patient denies any new complaints.   Assessment/Plan: Principal Problem:   Atrial fibrillation with RVR (HCC) Active Problems:   Weakness   Paroxysmal A-fib (HCC)   Atrial flutter with RVR TSH unremarkable BNP 327, troponin mildly elevated with flat trend Echo done showed global hypokinesis, with moderate to severe LV dysfunction, EF of 30 to 35%, with regional wall motion abnormality Cardiology consulted, continue amiodarone, added p.o. metoprolol, started Eliquis, discontinued aspirin Telemetry  New acute systolic HF Does not appear volume overloaded Echo has noted above with EF of 30 to 35% Cardiology consulted Strict I's and O's, daily weight Telemetry  COVID-19 infection Currently afebrile, with no leukocytosis, currently saturating well on room air Trend inflammatory markers Chest x-ray unremarkable Due to advanced age, symptomatic and high risk for deterioration, completed 5 days of remdesivir on 10/01/2020 Continue vitamins, cough suppressants, inhalers as needed Supplemental oxygen as needed Monitor closely  Hypertension Continue losartan  History of prior CVA Noted to have slurred speech prior to arrival MRI brain done showed no acute  findings Continue Lipitor, started Eliquis for A. fib Telemetry  History of mitral valve repair Outpatient follow-up  History of dementia Continue Aricept, Namenda       Estimated body mass index is 16.87 kg/m as calculated from the following:   Height as of this encounter: 5\' 3"  (1.6 m).   Weight as of this encounter: 43.2 kg.     Code Status: Full  Family Communication: Discussed with granddaughter over the phone on 10/01/2020  Disposition Plan: Status is: Inpatient  Remains inpatient appropriate because:Inpatient level of care appropriate due to severity of illness  Dispo: The patient is from: Home              Anticipated d/c is to: Home              Patient currently is not medically stable to d/c.   Difficult to place patient No   Consultants: Cardiology  Procedures: None  Antimicrobials: None  DVT prophylaxis: Eliquis   Objective: Vitals:   10/01/20 2043 10/02/20 0422 10/02/20 1202 10/02/20 1700  BP: 113/70 119/63 (!) 142/65 (!) 130/110  Pulse: (!) 110 100    Resp: 17 17 16    Temp: 97.7 F (36.5 C) 97.6 F (36.4 C) 98 F (36.7 C)   TempSrc: Oral Oral Oral   SpO2: 96% 96% 96%   Weight:  43.2 kg    Height:        Intake/Output Summary (Last 24 hours) at 10/02/2020 1902 Last data filed at 10/02/2020 1100 Gross per 24 hour  Intake 240 ml  Output 700 ml  Net -460 ml   Filed Weights   09/29/20 0522 10/01/20 0355 10/02/20 0422  Weight: 42.5 kg  44.2 kg 43.2 kg    Exam: General: NAD, frail Cardiovascular: S1, S2 present Respiratory: CTAB Abdomen: Soft, nontender, nondistended, bowel sounds present Musculoskeletal: No bilateral pedal edema noted Skin: Normal Psychiatry: Normal mood  Neurology: No obvious focal neurologic deficits noted, strength equal in all extremities    Data Reviewed: CBC: Recent Labs  Lab 09/26/20 2024 09/28/20 0804 09/29/20 0551 09/30/20 0545 10/01/20 0500  WBC 8.3 6.5 4.7 5.8 5.4  NEUTROABS 6.4 4.3 2.7  3.8 3.4  HGB 12.8 13.2 13.0 12.8 12.0  HCT 39.8 39.5 39.0 37.9 35.8*  MCV 94.3 91.2 90.7 90.7 90.9  PLT 166 160 170 165 295   Basic Metabolic Panel: Recent Labs  Lab 09/28/20 0804 09/29/20 0551 09/30/20 0545 10/01/20 0500 10/02/20 0500  NA 136 138 135 135 136  K 3.9 4.0 3.9 3.7 3.6  CL 105 106 103 102 104  CO2 22 25 22 24 25   GLUCOSE 105* 100* 101* 167* 91  BUN 18 18 18 18 21   CREATININE 0.91 0.87 0.95 1.00 0.98  CALCIUM 9.2 9.1 8.9 8.8* 8.8*   GFR: Estimated Creatinine Clearance: 26 mL/min (by C-G formula based on SCr of 0.98 mg/dL). Liver Function Tests: Recent Labs  Lab 09/28/20 0804 09/29/20 0551 09/30/20 0545 10/01/20 0500 10/02/20 0500  AST 24 25 21 19 19   ALT 20 21 17 16 17   ALKPHOS 47 49 50 51 52  BILITOT 1.1 0.9 0.7 0.7 0.7  PROT 6.4* 6.1* 5.6* 5.3* 5.4*  ALBUMIN 3.5 3.2* 3.0* 2.8* 2.8*   No results for input(s): LIPASE, AMYLASE in the last 168 hours. No results for input(s): AMMONIA in the last 168 hours. Coagulation Profile: No results for input(s): INR, PROTIME in the last 168 hours. Cardiac Enzymes: No results for input(s): CKTOTAL, CKMB, CKMBINDEX, TROPONINI in the last 168 hours. BNP (last 3 results) No results for input(s): PROBNP in the last 8760 hours. HbA1C: No results for input(s): HGBA1C in the last 72 hours. CBG: No results for input(s): GLUCAP in the last 168 hours. Lipid Profile: No results for input(s): CHOL, HDL, LDLCALC, TRIG, CHOLHDL, LDLDIRECT in the last 72 hours. Thyroid Function Tests: No results for input(s): TSH, T4TOTAL, FREET4, T3FREE, THYROIDAB in the last 72 hours.  Anemia Panel: Recent Labs    09/30/20 0545 10/01/20 0500  FERRITIN 197 210   Urine analysis:    Component Value Date/Time   COLORURINE YELLOW 09/26/2020 2354   APPEARANCEUR CLEAR 09/26/2020 2354   LABSPEC 1.005 09/26/2020 2354   PHURINE 6.0 09/26/2020 2354   GLUCOSEU NEGATIVE 09/26/2020 2354   HGBUR SMALL (A) 09/26/2020 2354   BILIRUBINUR  NEGATIVE 09/26/2020 2354   BILIRUBINUR negative 08/09/2012 1203   KETONESUR NEGATIVE 09/26/2020 2354   PROTEINUR NEGATIVE 09/26/2020 2354   UROBILINOGEN negative 08/09/2012 1203   NITRITE NEGATIVE 09/26/2020 2354   LEUKOCYTESUR NEGATIVE 09/26/2020 2354   Sepsis Labs: @LABRCNTIP (procalcitonin:4,lacticidven:4)  ) Recent Results (from the past 240 hour(s))  Resp Panel by RT-PCR (Flu A&B, Covid) Nasopharyngeal Swab     Status: Abnormal   Collection Time: 09/28/20 12:11 AM   Specimen: Nasopharyngeal Swab; Nasopharyngeal(NP) swabs in vial transport medium  Result Value Ref Range Status   SARS Coronavirus 2 by RT PCR POSITIVE (A) NEGATIVE Final    Comment: RESULT CALLED TO, READ BACK BY AND VERIFIED WITH: RN KATHY SEGEL BY MESSAN H. AT 1884 ON 7 22 2022 (NOTE) SARS-CoV-2 target nucleic acids are DETECTED.  The SARS-CoV-2 RNA is generally detectable in upper respiratory specimens during the  acute phase of infection. Positive results are indicative of the presence of the identified virus, but do not rule out bacterial infection or co-infection with other pathogens not detected by the test. Clinical correlation with patient history and other diagnostic information is necessary to determine patient infection status. The expected result is Negative.  Fact Sheet for Patients: EntrepreneurPulse.com.au  Fact Sheet for Healthcare Providers: IncredibleEmployment.be  This test is not yet approved or cleared by the [place] FDA and  has been authorized for detection and/or diagnosis of SARS-CoV-2 by FDA under an Emergency Use Authorization (EUA).  This EUA will remain in effect (meaning t his test can be used) for the duration of  the COVID-19 declaration under Section 564(b)(1) of the Act, 21 U.S.C. section 360bbb-3(b)(1), unless the authorization is terminated or revoked sooner.     Influenza A by PCR NEGATIVE NEGATIVE Final   Influenza B by PCR  NEGATIVE NEGATIVE Final    Comment: (NOTE) The Xpert Xpress SARS-CoV-2/FLU/RSV plus assay is intended as an aid in the diagnosis of influenza from Nasopharyngeal swab specimens and should not be used as a sole basis for treatment. Nasal washings and aspirates are unacceptable for Xpert Xpress SARS-CoV-2/FLU/RSV testing.  Fact Sheet for Patients: EntrepreneurPulse.com.au  Fact Sheet for Healthcare Providers: IncredibleEmployment.be  This test is not yet approved or cleared by the [place] FDA and has been authorized for detection and/or diagnosis of SARS-CoV-2 by FDA under an Emergency Use Authorization (EUA). This EUA will remain in effect (meaning this test can be used) for the duration of the COVID-19 declaration under Section 564(b)(1) of the Act, 21 U.S.C. section 360bbb-3(b)(1), unless the authorization is terminated or revoked.  Performed at [facility], [place] [address] [place], [place] [address]       Studies: No results found.  Scheduled Meds:  amiodarone  400 mg Oral BID   apixaban  2.5 mg Oral BID   vitamin C  500 mg Oral Daily   atorvastatin  10 mg Oral Daily   Chlorhexidine Gluconate Cloth  6 each Topical Daily   donepezil  5 mg Oral QHS   folic acid  1 mg Oral Daily   losartan  25 mg Oral Daily   memantine  10 mg Oral BID   metoprolol succinate  25 mg Oral BID   polyethylene glycol  17 g Oral Daily   sodium chloride flush  10-40 mL Intracatheter Q12H   sodium chloride flush  10-40 mL Intracatheter Q12H   zinc sulfate  220 mg Oral Daily    Continuous Infusions:     LOS: 4 days     Alma Friendly, MD [facility]  If 7PM-7AM, please contact night-coverage [email or web address] 10/02/2020, 7:02 PM

## 2020-10-03 ENCOUNTER — Telehealth: Payer: Self-pay | Admitting: Cardiovascular Disease

## 2020-10-03 DIAGNOSIS — I4891 Unspecified atrial fibrillation: Secondary | ICD-10-CM | POA: Diagnosis not present

## 2020-10-03 LAB — COMPREHENSIVE METABOLIC PANEL
ALT: 15 U/L (ref 0–44)
AST: 17 U/L (ref 15–41)
Albumin: 2.9 g/dL — ABNORMAL LOW (ref 3.5–5.0)
Alkaline Phosphatase: 51 U/L (ref 38–126)
Anion gap: 5 (ref 5–15)
BUN: 20 mg/dL (ref 8–23)
CO2: 25 mmol/L (ref 22–32)
Calcium: 8.6 mg/dL — ABNORMAL LOW (ref 8.9–10.3)
Chloride: 106 mmol/L (ref 98–111)
Creatinine, Ser: 1.01 mg/dL — ABNORMAL HIGH (ref 0.44–1.00)
GFR, Estimated: 53 mL/min — ABNORMAL LOW (ref >60)
Glucose, Bld: 91 mg/dL (ref 70–99)
Potassium: 3.5 mmol/L (ref 3.5–5.1)
Sodium: 136 mmol/L (ref 135–145)
Total Bilirubin: 0.7 mg/dL (ref 0.3–1.2)
Total Protein: 5.5 g/dL — ABNORMAL LOW (ref 6.5–8.1)

## 2020-10-03 MED ORDER — METOPROLOL SUCCINATE ER 25 MG PO TB24
25.0000 mg | ORAL_TABLET | Freq: Every day | ORAL | Status: DC
  Administered 2020-10-04: 25 mg via ORAL
  Filled 2020-10-03 (×2): qty 1

## 2020-10-03 NOTE — Progress Notes (Addendum)
Sharon NOTE    [name]  JME:[id number] DOB: Jul 07, 1928 DOA: 09/26/2020 PCP: [name], MD   Brief Narrative: 87 year old with past medical history significant for hypertension, paroxysmal A. fib, mitral valve repair, history of a stroke, she has experienced some slurring speech PTA, lasted 5 minutes, has been feeling weak with a fever of 101.  Due to all of this, decided to come to the ED.  While waiting in the ER patient aesthetics.  Palpitation.  Patient was found to be in a flutter with RVR, started on Cardizem and fusion and heparin.  Cardiology was consulted.  Chest x-ray UA was unremarkable.  COVID test was positive.  Patient was admitted for A. fib with RVR as well as COVID-19 infection.    Assessment & Plan:   Principal Problem:   Atrial fibrillation with RVR (HCC) Active Problems:   Weakness   Paroxysmal A-fib (HCC)  1-A flutter with RVR Patient unremarkable, BNP 327, echo showed global hypokinesis with moderate PR left ventricular dysfunction, ejection fraction 30 to 35% with regional wall motion abnormality. Cardiology was consulted.  Patient was a started on amiodarone. She was started on Eliquis, continue. Aspirin was discontinued. She developed bradycardia last night, her BB dose was decrease and change to toprol XL.   2-New Acute Systolic Heart Failure Currently euvolemic Continue with Cozaar.   3-COVID 19 infection: Patient afebrile, no hypoxemia. Chest x-ray was unremarkable. Completed 5 days of remdesivir and 7/25 Continue with vitamins and inhalers as needed COVID-19 Labs  Recent Labs    10/01/20 0500  DDIMER 0.70*  FERRITIN 210  CRP 6.0*    Lab Results  Component Value Date   SARSCOV2NAA POSITIVE (A) 09/28/2020    Hypertension: Continue with losartan  History of prior CVA: Noted to have slurred speech prior to arrival. MRI Negative for acute stroke. Continue with Lipitor and she was a started on Eliquis  History of mitral valve  repair: Follow-up outpatient with cardiology History of dementia: Continue with Aricept and Namenda    Estimated body mass index is 16.75 kg/m as calculated from the following:   Height as of this encounter: 5\' 3"  (1.6 m).   Weight as of this encounter: 42.9 kg.   DVT prophylaxis: Eliquis Code Status: Full code Family Communication: try to contact daughter, didn't answer call.  Disposition Plan:  Status is: Inpatient  Remains inpatient appropriate because:IV treatments appropriate due to intensity of illness or inability to take PO  Dispo: The patient is from: Home              Anticipated d/c is to: Home              Patient currently is not medically stable to d/c.   Difficult to place patient No        Consultants:  Cardiology   Procedures:  None  Antimicrobials:    Subjective: She is alert, working with OT. She is feeling well, denies worsening dyspnea. She was confuse last night and had some hallucination.   Objective: Vitals:   10/02/20 1700 10/02/20 2012 10/03/20 0422 10/03/20 1115  BP: (!) 130/110 107/61 (!) 132/54 131/66  Pulse:  (!) 58 (!) 48 64  Resp:  17 18 16   Temp:  97.6 F (36.4 C) 97.7 F (36.5 C) 97.8 F (36.6 C)  TempSrc:  Oral Oral Oral  SpO2:  96% 98% 98%  Weight:   42.9 kg   Height:        Intake/Output Summary (Last  24 hours) at 10/03/2020 1502 Last data filed at 10/03/2020 1210 Gross per 24 hour  Intake --  Output 625 ml  Net -625 ml   Filed Weights   10/01/20 0355 10/02/20 0422 10/03/20 0422  Weight: 44.2 kg 43.2 kg 42.9 kg    Examination:  General exam: Appears calm and comfortable  Respiratory system: Clear to auscultation. Respiratory effort normal. Cardiovascular system: S1 & S2 heard, IRR. Gastrointestinal system: Abdomen is nondistended, soft and nontender. No organomegaly or masses felt. Normal bowel sounds heard. Central nervous system: Alert and oriented. No focal neurological deficits. Extremities: Symmetric  5 x 5 power.   Data Reviewed: I have personally reviewed following labs and imaging studies  CBC: Recent Labs  Lab 09/26/20 2024 09/28/20 0804 09/29/20 0551 09/30/20 0545 10/01/20 0500  WBC 8.3 6.5 4.7 5.8 5.4  NEUTROABS 6.4 4.3 2.7 3.8 3.4  HGB 12.8 13.2 13.0 12.8 12.0  HCT 39.8 39.5 39.0 37.9 35.8*  MCV 94.3 91.2 90.7 90.7 90.9  PLT 166 160 170 165 681   Basic Metabolic Panel: Recent Labs  Lab 09/29/20 0551 09/30/20 0545 10/01/20 0500 10/02/20 0500 10/03/20 0443  NA 138 135 135 136 136  K 4.0 3.9 3.7 3.6 3.5  CL 106 103 102 104 106  CO2 25 22 24 25 25   GLUCOSE 100* 101* 167* 91 91  BUN 18 18 18 21 20   CREATININE 0.87 0.95 1.00 0.98 1.01*  CALCIUM 9.1 8.9 8.8* 8.8* 8.6*   GFR: Estimated Creatinine Clearance: 25.1 mL/min (A) (by C-G formula based on SCr of 1.01 mg/dL (H)). Liver Function Tests: Recent Labs  Lab 09/29/20 0551 09/30/20 0545 10/01/20 0500 10/02/20 0500 10/03/20 0443  AST 25 21 19 19 17   ALT 21 17 16 17 15   ALKPHOS 49 50 51 52 51  BILITOT 0.9 0.7 0.7 0.7 0.7  PROT 6.1* 5.6* 5.3* 5.4* 5.5*  ALBUMIN 3.2* 3.0* 2.8* 2.8* 2.9*   No results for input(s): LIPASE, AMYLASE in the last 168 hours. No results for input(s): AMMONIA in the last 168 hours. Coagulation Profile: No results for input(s): INR, PROTIME in the last 168 hours. Cardiac Enzymes: No results for input(s): CKTOTAL, CKMB, CKMBINDEX, TROPONINI in the last 168 hours. BNP (last 3 results) No results for input(s): PROBNP in the last 8760 hours. HbA1C: No results for input(s): HGBA1C in the last 72 hours. CBG: No results for input(s): GLUCAP in the last 168 hours. Lipid Profile: No results for input(s): CHOL, HDL, LDLCALC, TRIG, CHOLHDL, LDLDIRECT in the last 72 hours. Thyroid Function Tests: No results for input(s): TSH, T4TOTAL, FREET4, T3FREE, THYROIDAB in the last 72 hours. Anemia Panel: Recent Labs    10/01/20 0500  FERRITIN 210   Sepsis Labs: No results for input(s):  PROCALCITON, LATICACIDVEN in the last 168 hours.  Recent Results (from the past 240 hour(s))  Resp Panel by RT-PCR (Flu A&B, Covid) Nasopharyngeal Swab     Status: Abnormal   Collection Time: 09/28/20 12:11 AM   Specimen: Nasopharyngeal Swab; Nasopharyngeal(NP) swabs in vial transport medium  Result Value Ref Range Status   SARS Coronavirus 2 by RT PCR POSITIVE (A) NEGATIVE Final    Comment: RESULT CALLED TO, READ BACK BY AND VERIFIED WITH: RN KATHY SEGEL BY MESSAN H. AT 2751 ON 7 22 2022 (NOTE) SARS-CoV-2 target nucleic acids are DETECTED.  The SARS-CoV-2 RNA is generally detectable in upper respiratory specimens during the acute phase of infection. Positive results are indicative of the presence of the identified virus,  but do not rule out bacterial infection or co-infection with other pathogens not detected by the test. Clinical correlation with patient history and other diagnostic information is necessary to determine patient infection status. The expected result is Negative.  Fact Sheet for Patients: EntrepreneurPulse.com.au  Fact Sheet for Healthcare Providers: IncredibleEmployment.be  This test is not yet approved or cleared by the [place] FDA and  has been authorized for detection and/or diagnosis of SARS-CoV-2 by FDA under an Emergency Use Authorization (EUA).  This EUA will remain in effect (meaning t his test can be used) for the duration of  the COVID-19 declaration under Section 564(b)(1) of the Act, 21 U.S.C. section 360bbb-3(b)(1), unless the authorization is terminated or revoked sooner.     Influenza A by PCR NEGATIVE NEGATIVE Final   Influenza B by PCR NEGATIVE NEGATIVE Final    Comment: (NOTE) The Xpert Xpress SARS-CoV-2/FLU/RSV plus assay is intended as an aid in the diagnosis of influenza from Nasopharyngeal swab specimens and should not be used as a sole basis for treatment. Nasal washings and aspirates are  unacceptable for Xpert Xpress SARS-CoV-2/FLU/RSV testing.  Fact Sheet for Patients: EntrepreneurPulse.com.au  Fact Sheet for Healthcare Providers: IncredibleEmployment.be  This test is not yet approved or cleared by the [place] FDA and has been authorized for detection and/or diagnosis of SARS-CoV-2 by FDA under an Emergency Use Authorization (EUA). This EUA will remain in effect (meaning this test can be used) for the duration of the COVID-19 declaration under Section 564(b)(1) of the Act, 21 U.S.C. section 360bbb-3(b)(1), unless the authorization is terminated or revoked.  Performed at [facility], [place] [address], [place], [place] [address]          Radiology Studies: No results found.      Scheduled Meds:  amiodarone  400 mg Oral BID   apixaban  2.5 mg Oral BID   vitamin C  500 mg Oral Daily   atorvastatin  10 mg Oral Daily   Chlorhexidine Gluconate Cloth  6 each Topical Daily   donepezil  5 mg Oral QHS   folic acid  1 mg Oral Daily   losartan  25 mg Oral Daily   memantine  10 mg Oral BID   [START ON 10/04/2020] metoprolol succinate  25 mg Oral Daily   polyethylene glycol  17 g Oral Daily   sodium chloride flush  10-40 mL Intracatheter Q12H   sodium chloride flush  10-40 mL Intracatheter Q12H   zinc sulfate  220 mg Oral Daily   Continuous Infusions:   LOS: 5 days    Time spent: 35 minutes    Deanza Upperman A Starlee Corralejo, MD [facility]   If 7PM-7AM, please contact night-coverage [email or web address]  10/03/2020, 3:02 PM

## 2020-10-03 NOTE — Progress Notes (Signed)
Occupational Therapy Treatment/Discharge Patient Details Name: BUFORD GAYLER MRN: [id number] DOB: 1929/01/19 Today's Date: 10/03/2020    History of present illness Pt is a 87 y.o. female admitted 09/26/20 with slurred speech, weakness, fever, palpitations. Workup for afib with RVR, (+) COVID-19. Brain MRI negative for acute infarcts. PMH includes HTN, afib (no on anticoagulation), NSTEMI, CVA, dementia.   OT comments  Pt able to meet acute OT goals. Pt able to demo LB dressing tasks Independently. Session focused on UE HEP education with good carryover noted. Handout provided for pt reference. Reinforced safety techniques for return home. No further skilled OT services needed at acute level. OT to sign off.    Follow Up Recommendations  No OT follow up    Equipment Recommendations  None recommended by OT    Recommendations for Other Services      Precautions / Restrictions Precautions Precautions: Fall Precaution Comments: Watch HR Restrictions Weight Bearing Restrictions: No       Mobility Bed Mobility Overal bed mobility: Modified Independent             General bed mobility comments: up in chair    Transfers Overall transfer level: Independent Equipment used: None Transfers: Sit to/from Stand Sit to Stand: [employer] transfer comment: No assist or safety concerns when standing for LB dressing    Balance Overall balance assessment: Needs assistance Sitting-balance support: No upper extremity supported;Feet supported Sitting balance-Leahy Scale: Good     Standing balance support: No upper extremity supported;During functional activity Standing balance-Leahy Scale: Good Standing balance comment: Prolonged standing at sink to perform ADL tasks without UE support, no overt instability or LOB; stability improved ambulating with at least single UE support                           ADL either performed or assessed with  clinical judgement   ADL Overall ADL's : Independent                     Lower Body Dressing: Independent;Sit to/from stand                 General ADL Comments: Independent in donning underwear. Did complete ADLs with PT earlier with no assist needed. Pt progressing well with balance and safety     Vision   Vision Assessment?: No apparent visual deficits   Perception     Praxis      Cognition Arousal/Alertness: Awake/alert Behavior During Therapy: WFL for tasks assessed/performed Overall Cognitive Status: Within Functional Limits for tasks assessed                                 General Comments: very pleasant, participatory; some decreased awareness of deficits; values independence        Exercises Exercises: General Upper Extremity General Exercises - Upper Extremity Shoulder Flexion: Strengthening;Both;20 reps;Seated;Theraband Theraband Level (Shoulder Flexion): Level 1 (Yellow) Shoulder Horizontal ABduction: Strengthening;Both;20 reps;Seated;Theraband Theraband Level (Shoulder Horizontal Abduction): Level 1 (Yellow) Elbow Flexion: Strengthening;Both;10 reps;Seated;Theraband Theraband Level (Elbow Flexion): Level 1 (Yellow) Elbow Extension: Strengthening;Both;10 reps;Seated;Theraband Theraband Level (Elbow Extension): Level 1 (Yellow)   Shoulder Instructions       General Comments VSS on RA.    Pertinent Vitals/ Pain       Pain Assessment: No/denies pain  Home Living  Prior Functioning/Environment              Frequency           Progress Toward Goals  OT Goals(current goals can now be found in the care plan section)  Progress towards OT goals: Goals met/education completed, patient discharged from OT  Acute Rehab OT Goals Patient Stated Goal: get out of bed, walk around more OT Goal Formulation: With patient Time For Goal Achievement: 10/15/20 Potential  to Achieve Goals: Good ADL Goals Pt Will Transfer to Toilet: Independently;ambulating Pt/caregiver will Perform Home Exercise Program: Increased strength;With theraband;Both right and left upper extremity;Independently;With written HEP provided  Plan Discharge plan remains appropriate;All goals met and education completed, patient discharged from OT services    Co-evaluation                 AM-PAC OT "6 Clicks" Daily Activity     Outcome Measure   Help from another person eating meals?: None Help from another person taking care of personal grooming?: None Help from another person toileting, which includes using toliet, bedpan, or urinal?: None Help from another person bathing (including washing, rinsing, drying)?: None Help from another person to put on and taking off regular upper body clothing?: None Help from another person to put on and taking off regular lower body clothing?: None 6 Click Score: 24    End of Session    OT Visit Diagnosis: Unsteadiness on feet (R26.81);Muscle weakness (generalized) (M62.81)   Activity Tolerance Patient tolerated treatment well   Patient Left in chair;with call bell/phone within reach;with chair alarm set   Nurse Communication          Time: 1010-1041 OT Time Calculation (min): 31 min  Charges: OT General Charges $OT Visit: 1 Visit OT Treatments $Self Care/Home Management : 8-22 mins $Therapeutic Exercise: 8-22 mins  Malachy Chamber, OTR/L Acute Rehab Services Office: [phone]    [name] 10/03/2020, 11:10 AM

## 2020-10-03 NOTE — Telephone Encounter (Signed)
Called patient's daughter to let her know that Dr. Johnsie Cancel would not be coming to see the patient today, that he is at another location. Informed her that Dr. Johnsie Cancel trust Dr. Tamala Julian and her mother is in good hands. Patient's daughter verbalized understanding.

## 2020-10-03 NOTE — Progress Notes (Addendum)
Progress Note  Patient Name: [name] Date of Encounter: 10/03/2020  [facility] Cardiologist: [name], MD   Subjective   No major events overnight  Inpatient Medications    Scheduled Meds:  amiodarone  400 mg Oral BID   apixaban  2.5 mg Oral BID   vitamin C  500 mg Oral Daily   atorvastatin  10 mg Oral Daily   Chlorhexidine Gluconate Cloth  6 each Topical Daily   donepezil  5 mg Oral QHS   folic acid  1 mg Oral Daily   losartan  25 mg Oral Daily   memantine  10 mg Oral BID   metoprolol succinate  25 mg Oral BID   polyethylene glycol  17 g Oral Daily   sodium chloride flush  10-40 mL Intracatheter Q12H   sodium chloride flush  10-40 mL Intracatheter Q12H   zinc sulfate  220 mg Oral Daily   Continuous Infusions:   PRN Meds: acetaminophen **OR** acetaminophen, docusate sodium, guaiFENesin-dextromethorphan, sodium chloride flush, sodium chloride flush, witch hazel-glycerin   Vital Signs    Vitals:   10/02/20 1202 10/02/20 1700 10/02/20 2012 10/03/20 0422  BP: (!) 142/65 (!) 130/110 107/61 (!) 132/54  Pulse:   (!) 58 (!) 48  Resp: 16  17 18   Temp: 98 F (36.7 C)  97.6 F (36.4 C) 97.7 F (36.5 C)  TempSrc: Oral  Oral Oral  SpO2: 96%  96% 98%  Weight:    42.9 kg  Height:        Intake/Output Summary (Last 24 hours) at 10/03/2020 1038 Last data filed at 10/03/2020 0425 Gross per 24 hour  Intake --  Output 875 ml  Net -875 ml   Last 3 Weights 10/03/2020 10/02/2020 10/01/2020  Weight (lbs) 94 lb 9.2 oz 95 lb 3.8 oz 97 lb 7.1 oz  Weight (kg) 42.9 kg 43.2 kg 44.2 kg      Telemetry   Today and sinus bradycardia.  Frequent PACs.- Personally Reviewed  ECG   Need a new EKG today.- Personally Reviewed  Physical Exam  Not examined   Labs    High Sensitivity Troponin:   Recent Labs  Lab 09/28/20 2328 09/29/20 0551  TROPONINIHS 31* 28*      Chemistry Recent Labs  Lab 10/01/20 0500 10/02/20 0500 10/03/20 0443  NA 135 136 136  K 3.7  3.6 3.5  CL 102 104 106  CO2 24 25 25   GLUCOSE 167* 91 91  BUN 18 21 20   CREATININE 1.00 0.98 1.01*  CALCIUM 8.8* 8.8* 8.6*  PROT 5.3* 5.4* 5.5*  ALBUMIN 2.8* 2.8* 2.9*  AST 19 19 17   ALT 16 17 15   ALKPHOS 51 52 51  BILITOT 0.7 0.7 0.7  GFRNONAA 54* 55* 53*  ANIONGAP 9 7 5      Hematology Recent Labs  Lab 09/29/20 0551 09/30/20 0545 10/01/20 0500  WBC 4.7 5.8 5.4  RBC 4.30 4.18 3.94  HGB 13.0 12.8 12.0  HCT 39.0 37.9 35.8*  MCV 90.7 90.7 90.9  MCH 30.2 30.6 30.5  MCHC 33.3 33.8 33.5  RDW 14.2 14.1 13.9  PLT 170 165 173    BNP Recent Labs  Lab 09/28/20 0804  BNP 327.7*     DDimer  Recent Labs  Lab 09/29/20 0551 09/30/20 0545 10/01/20 0500  DDIMER 0.44 0.44 0.70*     Radiology    No results found.  Cardiac Studies  Echocardiographic EF is 35%.  Patient Profile  87 y.o. female with HTN, HLD, PAF not on AC (remained in NSR), h/o MV repair, and dementia presented on 09/27/2020 with weakness, fever and tachycardia and also found to be COVID-19 positive.Both atrial fibrillation and atrial flutter during this admission.  Assessment & Plan    Persistent/paroxysmal atrial fibrillation: Sinus rhythm on monitor today.  Hopefully amiodarone will control rhythm.  We will decrease metoprolol to metoprolol succinate 25 mg once per day.  The combination with amiodarone at her age may cause excessive bradycardia.  Should not get Toprol-XL if heart rate is 55 or less.   Systolic heart failure: Heart failure regimen should include beta-blocker/ARB/SGLT2/MRA.  These can be added as OP as tolerated by blood pressure once COVID infection has resolved.  We should switch metoprolol from tartrate to metoprolol succinate.  Overall guarded prognosis given age and other comorbidities.     For questions or updates, please contact [place] Please consult [email or web address] for contact info under        Signed, Sinclair Grooms, MD  10/03/2020, 10:38 AM

## 2020-10-03 NOTE — Progress Notes (Signed)
Physical Therapy Treatment Patient Details Name: [name] MRN: [id number] DOB: [birth year] Today's Date: 10/03/2020    History of Present Illness Pt is a 87 y.o. female admitted 09/26/20 with slurred speech, weakness, fever, palpitations. Workup for afib with RVR, (+) COVID-19. Brain MRI negative for acute infarcts. PMH includes HTN, afib (no on anticoagulation), NSTEMI, CVA, dementia.   PT Comments    Pt progressing well with mobility. Today's session focused on transfer and gait training with SPC, as well as stability with standing ADL tasks. Pt pleasant and motivated to participate. HR remains 60s-70s during session. Encouraged DME use upon return home; pt in agreement. Will continue to follow acutely.    Follow Up Recommendations  No PT follow up;Supervision for mobility/OOB     Equipment Recommendations  None recommended by PT    Recommendations for Other Services       Precautions / Restrictions Precautions Precautions: Fall Precaution Comments: Watch HR Restrictions Weight Bearing Restrictions: No    Mobility  Bed Mobility Overal bed mobility: Modified Independent                  Transfers Overall transfer level: Needs assistance Equipment used: 1 person hand held assist;None Transfers: Sit to/from Stand Sit to Stand: Min guard;Supervision         General transfer comment: Min guard for balance with initial stand from EOB, reaching to [facility] for stability; additional sit<>stands from toilet, chair and recliner without HHA, supervision for safety  Ambulation/Gait Ambulation/Gait assistance: Min guard Gait Distance (Feet): 28 Feet (+40') Assistive device: None;Straight cane Gait Pattern/deviations: Step-through pattern;Decreased stride length;Trunk flexed Gait velocity: Decreased   General Gait Details: Initial HHA and min guard for pt request for walk to bathroom (pt notes first time up today); additional ambulation with SPC, intermittent min guard for  balance, noted improvements in stability; pt still at times reaching to furniture for additional UE support, but declines ambulation with RW   Stairs             Wheelchair Mobility    Modified Rankin (Stroke Patients Only)       Balance Overall balance assessment: Needs assistance Sitting-balance support: No upper extremity supported;Feet supported Sitting balance-Leahy Scale: Good     Standing balance support: No upper extremity supported;During functional activity Standing balance-Leahy Scale: Fair Standing balance comment: Prolonged standing at sink to perform ADL tasks without UE support, no overt instability or LOB; stability improved ambulating with at least single UE support                            Cognition Arousal/Alertness: Awake/alert Behavior During Therapy: WFL for tasks assessed/performed Overall Cognitive Status: Within Functional Limits for tasks assessed                                 General Comments: very pleasant, participatory; some decreased awareness of deficits; values independence      Exercises      General Comments General comments (skin integrity, edema, etc.): HR 60s-70s      Pertinent Vitals/Pain Pain Assessment: No/denies pain    Home Living                      Prior Function            PT Goals (current goals can now be found in the care plan  section) Progress towards PT goals: Progressing toward goals    Frequency    Min 3X/week      PT Plan Current plan remains appropriate    Co-evaluation              AM-PAC PT "6 Clicks" Mobility   Outcome Measure  Help needed turning from your back to your side while in a flat bed without using bedrails?: None Help needed moving from lying on your back to sitting on the side of a flat bed without using bedrails?: None Help needed moving to and from a bed to a chair (including a wheelchair)?: A Little Help needed standing up  from a chair using your arms (e.g., wheelchair or bedside chair)?: A Little Help needed to walk in hospital room?: A Little Help needed climbing 3-5 steps with a railing? : A Little 6 Click Score: 20    End of Session   Activity Tolerance: Patient tolerated treatment well Patient left: in chair;with call bell/phone within reach;with chair alarm set Nurse Communication: Mobility status;Other (comment) (need for purewick) PT Visit Diagnosis: Muscle weakness (generalized) (M62.81);Other abnormalities of gait and mobility (R26.89)     Time: 4268-3419 PT Time Calculation (min) (ACUTE ONLY): 35 min  Charges:  $Gait Training: 8-22 mins $Therapeutic Activity: 8-22 mins                     Mabeline Caras, PT, DPT Acute Rehabilitation Services  Pager [phone] Office [place] 10/03/2020, 10:35 AM

## 2020-10-03 NOTE — Telephone Encounter (Signed)
Patient's daughter called to say that the patient is asking that Dr. Johnsie Cancel stop by to see her in the hospital. Patient trust Dr. Johnsie Cancel and would rather he treat her while she is in there. Please advise

## 2020-10-04 DIAGNOSIS — I4891 Unspecified atrial fibrillation: Secondary | ICD-10-CM | POA: Diagnosis not present

## 2020-10-04 LAB — C-REACTIVE PROTEIN: CRP: 2.2 mg/dL — ABNORMAL HIGH (ref <1.0)

## 2020-10-04 MED ORDER — AMIODARONE HCL 200 MG PO TABS: ORAL_TABLET | ORAL | 1 refills | Status: DC

## 2020-10-04 MED ORDER — ZINC SULFATE 220 (50 ZN) MG PO CAPS: 220.0000 mg | ORAL_CAPSULE | Freq: Every day | ORAL | 0 refills | Status: DC

## 2020-10-04 MED ORDER — ASCORBIC ACID 500 MG PO TABS: 500.0000 mg | ORAL_TABLET | Freq: Every day | ORAL | 0 refills | Status: DC

## 2020-10-04 MED ORDER — APIXABAN 2.5 MG PO TABS: 2.5000 mg | ORAL_TABLET | Freq: Two times a day (BID) | ORAL | 3 refills | Status: DC

## 2020-10-04 MED ORDER — METOPROLOL SUCCINATE ER 25 MG PO TB24
25.0000 mg | ORAL_TABLET | Freq: Every day | ORAL | 3 refills | Status: DC
Start: 2020-10-05 — End: 2021-03-29

## 2020-10-04 NOTE — Progress Notes (Signed)
Spoke with Dr. Tyrell Antonio Patient is doing well from cardiac standpoint.  Okay to discharge.  Predominantly in sinus rhythm and sinus bradycardia on amiodarone. Has received IV +400 twice daily loading for at least 5 days.  Okay to discharge home on 200 mg twice daily for an additional 10 days then decrease to 200 mg daily. Beta-blocker dose was decreased yesterday to avoid excessive bradycardia. We are working on setting up an appointment for 7-day follow-up with EKG at [place] /Dr. Johnsie Cancel.

## 2020-10-04 NOTE — Discharge Summary (Signed)
Physician Discharge Summary  [name]:[id number] DOB: 01-30-1929 DOA: 09/26/2020  PCP: Sharon Fraise, MD  Admit date: 09/26/2020 Discharge date: 10/04/2020  Admitted From: Home  Disposition:  Home   Recommendations for Outpatient Follow-up:  Follow up with PCP in 1-2 weeks Please obtain BMP/CBC in one week Follow up with cardiology for further care of A fib.   Home Health: yes.   Discharge Condition: Stable.  CODE STATUS:Full code Diet recommendation: Heart Healthy   Brief/Interim Summary: 87 year old with past medical history significant for hypertension, paroxysmal A. fib, mitral valve repair, history of a stroke, she has experienced some slurring speech PTA, lasted 5 minutes, has been feeling weak with a fever of 101.  Due to all of this, decided to come to the ED.  While waiting in the ER patient aesthetics.  Palpitation.  Patient was found to be in a flutter with RVR, started on Cardizem and fusion and heparin.  Cardiology was consulted.  Chest x-ray UA was unremarkable.  COVID test was positive.  Patient was admitted for A. fib with RVR as well as COVID-19 infection.    1-A flutter with RVR Patient unremarkable, BNP 327, echo showed global hypokinesis with moderate PR left ventricular dysfunction, ejection fraction 30 to 35% with regional wall motion abnormality. Cardiology was consulted.  Patient was a started on amiodarone. She was started on Eliquis, continue. Aspirin was discontinued. She developed bradycardia 7/17 her BB dose was decrease and change to toprol XL. HR improved. Plan to hold Aricept as well.  Plan to discharge on Amiodarone 200 mg BID for 10 days then she will take 200mg  daily until follow up with [name].  Discussed discharge medications with Dr Tamala Julian.    2-New Acute Systolic Heart Failure Currently euvolemic Continue with Cozaar.   3-COVID 19 infection: Patient afebrile, no hypoxemia. Chest x-ray was unremarkable. Completed 5 days of  remdesivir and 7/25 Continue with vitamins and inhalers as needed COVID-19 Labs  Stable. CRP  down. She needs to [facility] for total 10 days form covid test positive.   Recent Labs (last 2 labs)    Hypertension: Continue with losartan   History of prior CVA: Noted to have slurred speech prior to arrival. MRI Negative for acute stroke. Continue with Lipitor and she was a started on Eliquis   History of mitral valve repair: Follow-up outpatient with cardiology History of dementia: Continue with  Namenda. Hold Aricept at discharge as recommended by [name].    Discharge Diagnoses:  Principal Problem:   Atrial fibrillation with RVR ([facility]) Active Problems:   Weakness   Paroxysmal A-fib [facility])    Discharge Instructions  Discharge Instructions     Diet - low sodium heart healthy   Complete by: As directed    Increase activity slowly   Complete by: As directed       Allergies as of 10/04/2020       Reactions   Actonel [risedronate Sodium] Nausea And Vomiting   Aspirin-caffeine    Upset stomach   Codeine    REACTION: upset stomach   Sulfonamide Derivatives    REACTION: swollen tongue        Medication List     STOP taking these medications    amoxicillin 500 MG tablet Commonly known as: AMOXIL   aspirin EC 81 MG tablet   donepezil 5 MG disintegrating tablet Commonly known as: ARICEPT ODT       TAKE these medications    amiodarone 200 MG tablet Commonly  known as: PACERONE Take 1 tablet twice a day for 10 days, then take 1 tablet daily.   apixaban 2.5 MG Tabs tablet Commonly known as: ELIQUIS Take 1 tablet (2.5 mg total) by mouth 2 (two) times daily.   ascorbic acid 500 MG tablet Commonly known as: VITAMIN C Take 1 tablet (500 mg total) by mouth daily. Start taking on: October 05, 2020   atorvastatin 10 MG tablet Commonly known as: LIPITOR Take 1 tablet (10 mg total) by mouth daily.   Biotin 1000 MCG tablet Take 1,000 mcg by mouth daily.    folic acid 1 MG tablet Commonly known as: FOLVITE Take 1 tablet (1 mg total) by mouth daily.   losartan 50 MG tablet Commonly known as: COZAAR Take 25 mg by mouth daily.   memantine 10 MG tablet Commonly known as: Namenda Take 1 tablet (10 mg total) by mouth 2 (two) times daily. Start this only after finishing titration pack   metoprolol succinate 25 MG 24 hr tablet Commonly known as: TOPROL-XL Take 1 tablet (25 mg total) by mouth daily. Start taking on: October 05, 2020 What changed: how much to take   Vitamin D3 250 MCG (10000 UT) capsule Take 10,000 Units by mouth daily.   zinc sulfate 220 (50 Zn) MG capsule Take 1 capsule (220 mg total) by mouth daily. Start taking on: October 05, 2020        Follow-up Information     Sharon Fraise, MD Follow up in 1 week(s).   Specialty: Family Medicine Contact information: [place] [address] [phone]         Sharon Hector, MD .   Specialty: Cardiology Contact information: [phone] [place] [address] [phone]                Allergies  Allergen Reactions   Actonel [Risedronate Sodium] Nausea And Vomiting   Aspirin-Caffeine     Upset stomach    Codeine     REACTION: upset stomach   Sulfonamide Derivatives     REACTION: swollen tongue    Consultations: Cardiology    Procedures/Studies: DG Chest 2 View  Result Date: 09/26/2020 CLINICAL DATA:  Generalized weakness, loss of appetite, cough EXAM: CHEST - 2 VIEW COMPARISON:  08/22/2013 FINDINGS: Frontal and lateral views of the chest demonstrate a stable cardiac silhouette. Mitral valve prosthesis is identified. Calcified mediastinal lymph nodes are again identified consistent with previous granulomatous disease. There is a calcified granuloma in the left apex. No airspace disease, effusion, or pneumothorax. No acute bony abnormalities. IMPRESSION: 1. No acute intrathoracic process. Electronically Signed   By:  [name] M.D.   On: 09/26/2020 20:33   MR BRAIN WO CONTRAST  Result Date: 09/28/2020 CLINICAL DATA:  Acute stroke suspected.  Fever. EXAM: MRI HEAD WITHOUT CONTRAST TECHNIQUE: Multiplanar, multiecho pulse sequences of the brain and surrounding structures were obtained without intravenous contrast. COMPARISON:  06/26/2020 FINDINGS: Brain: No acute infarction, hemorrhage, hydrocephalus, extra-axial collection or mass lesion. Cerebral volume loss in keeping with age. Chronic small hemorrhagic foci spanning both peripheral and central brain. Over 10 foci are seen. Chronic small vessel ischemic gliosis in the periventricular white matter, mild for age. Mild cerebral volume loss. Vascular: Normal flow voids. Skull and upper cervical spine: Negative for marrow lesion. Prominent degenerative facet spurring. Sinuses/Orbits: Negative IMPRESSION: 1. No acute finding such as infarct. 2. Remote micro hemorrhages which could be from chronic hypertension and/or amyloid angiopathy. Electronically Signed  By: Monte Fantasia M.D.   On: 09/28/2020 06:15   DG Chest Port 1 View  Result Date: 09/30/2020 CLINICAL DATA:  Status post peripherally inserted central venous catheter (PICC) placement. EXAM: PORTABLE CHEST 1 VIEW COMPARISON:  Chest radiograph dated 09/26/2020. FINDINGS: The heart is enlarged. Vascular calcifications are seen in the aortic arch. The lungs are clear. There is no pneumothorax. Degenerative changes are seen in the spine. A right upper extremity PICC tip overlies the superior vena cava. IMPRESSION: Right upper extremity PICC with tip overlying the superior vena cava. No pneumothorax. Electronically Signed   By: Zerita Boers M.D.   On: 09/30/2020 14:54   ECHOCARDIOGRAM COMPLETE  Result Date: 09/28/2020    ECHOCARDIOGRAM REPORT   Patient Name:   [name] Date of Exam: 09/28/2020 Medical Rec #:  [id number]    Height:       63.0 in Accession #:    [id number]   Weight:       97.1 lb Date of Birth:   Jun 30, 1928    BSA:          1.422 m Patient Age:    87 years     BP:           123/81 mmHg Patient Gender: F            HR:           69 bpm. Exam Location:  Inpatient Procedure: 2D Echo, Cardiac Doppler and Color Doppler Indications:    Atrial fibrillation  History:        Patient has prior history of Echocardiogram examinations, most                 recent 01/12/2019. Previous Myocardial Infarction and Pericardial                 Disease, Stroke, Arrythmias:Atrial Fibrillation,                 Signs/Symptoms:Murmur; Risk Factors:Hypertension.                  Mitral Valve: valve is present in the mitral position.  Sonographer:    Luisa Hart RDCS Referring Phys: [place]  1. Global hypokinesis (worse in inferolateral wall); overall moderate to severe LV dysfunction; s/p MV repair with mean gradient of 4 mmHg and trace AI; compared to 01/12/19, LV function is worse.  2. Left ventricular ejection fraction, by estimation, is 30 to 35%. The left ventricle has moderate to severely decreased function. The left ventricle demonstrates regional wall motion abnormalities (see scoring diagram/findings for description). Left ventricular diastolic parameters are indeterminate. Elevated left atrial pressure.  3. Right ventricular systolic function is normal. The right ventricular size is normal. There is mildly elevated pulmonary artery systolic pressure.  4. Left atrial size was mildly dilated.  5. The mitral valve has been repaired/replaced. Trivial mitral valve regurgitation. No evidence of mitral stenosis. There is a present in the mitral position.  6. The aortic valve is tricuspid. Aortic valve regurgitation is mild. Mild aortic valve sclerosis is present, with no evidence of aortic valve stenosis.  7. The inferior vena cava is normal in size with greater than 50% respiratory variability, suggesting right atrial pressure of 3 mmHg. FINDINGS  Left Ventricle: Left ventricular ejection fraction, by  estimation, is 30 to 35%. The left ventricle has moderate to severely decreased function. The left ventricle demonstrates regional wall motion abnormalities. The left ventricular internal cavity size was  normal in size. There is no left ventricular hypertrophy. Left ventricular diastolic parameters are indeterminate. Elevated left atrial pressure. Right Ventricle: The right ventricular size is normal. Right ventricular systolic function is normal. There is mildly elevated pulmonary artery systolic pressure. The tricuspid regurgitant velocity is 3.00 m/s, and with an assumed right atrial pressure of 3 mmHg, the estimated right ventricular systolic pressure is 63.8 mmHg. Left Atrium: Left atrial size was mildly dilated. Right Atrium: Right atrial size was normal in size. Pericardium: There is no evidence of pericardial effusion. Mitral Valve: The mitral valve has been repaired/replaced. Mild mitral annular calcification. Trivial mitral valve regurgitation. There is a present in the mitral position. No evidence of mitral valve stenosis. MV peak gradient, 9.5 mmHg. The mean mitral  valve gradient is 4.0 mmHg. Tricuspid Valve: The tricuspid valve is normal in structure. Tricuspid valve regurgitation is mild . No evidence of tricuspid stenosis. Aortic Valve: The aortic valve is tricuspid. Aortic valve regurgitation is mild. Aortic regurgitation PHT measures 410 msec. Mild aortic valve sclerosis is present, with no evidence of aortic valve stenosis. Aortic valve mean gradient measures 3.0 mmHg. Aortic valve peak gradient measures 5.7 mmHg. Aortic valve area, by VTI measures 1.95 cm. Pulmonic Valve: The pulmonic valve was normal in structure. Pulmonic valve regurgitation is mild. No evidence of pulmonic stenosis. Aorta: The aortic root is normal in size and structure. Venous: The inferior vena cava is normal in size with greater than 50% respiratory variability, suggesting right atrial pressure of 3 mmHg. IAS/Shunts: No  atrial level shunt detected by color flow Doppler. Additional Comments: Global hypokinesis (worse in inferolateral wall); overall moderate to severe LV dysfunction; s/p MV repair with mean gradient of 4 mmHg and trace AI; compared to 01/12/19, LV function is worse.  LEFT VENTRICLE PLAX 2D LVIDd:         4.30 cm     Diastology LVIDs:         3.50 cm     LV e' medial:    3.78 cm/s LV PW:         1.10 cm     LV E/e' medial:  38.4 LV IVS:        1.10 cm     LV e' lateral:   6.25 cm/s LVOT diam:     2.00 cm     LV E/e' lateral: 23.2 LV SV:         49 LV SV Index:   35 LVOT Area:     3.14 cm  LV Volumes (MOD) LV vol d, MOD A2C: 58.8 ml LV vol d, MOD A4C: 46.9 ml LV vol s, MOD A2C: 38.1 ml LV vol s, MOD A4C: 43.6 ml LV SV MOD A2C:     20.7 ml LV SV MOD A4C:     46.9 ml LV SV MOD BP:      12.5 ml RIGHT VENTRICLE RV Basal diam:  3.80 cm RV Mid diam:    2.50 cm RV S prime:     9.28 cm/s TAPSE (M-mode): 1.4 cm LEFT ATRIUM             Index       RIGHT ATRIUM           Index LA Vol (A2C):   58.4 ml 41.06 ml/m RA Area:     14.80 cm LA Vol (A4C):   47.1 ml 33.11 ml/m RA Volume:   36.50 ml  25.66 ml/m LA Biplane Vol: 54.8 ml 38.53 ml/m  AORTIC VALVE                   PULMONIC VALVE AV Area (Vmax):    2.02 cm    PV Vmax:       0.97 m/s AV Area (Vmean):   1.82 cm    PV Vmean:      66.500 cm/s AV Area (VTI):     1.95 cm    PV VTI:        0.193 m AV Vmax:           119.00 cm/s PV Peak grad:  3.8 mmHg AV Vmean:          85.000 cm/s PV Mean grad:  2.0 mmHg AV VTI:            0.253 m AV Peak Grad:      5.7 mmHg AV Mean Grad:      3.0 mmHg LVOT Vmax:         76.50 cm/s LVOT Vmean:        49.300 cm/s PULMONARY ARTERY LVOT VTI:          0.157 m     MPA diam:        2.40 cm LVOT/AV VTI ratio: 0.62 AI PHT:            410 msec  AORTA Ao Root diam: 3.20 cm Ao Asc diam:  3.10 cm MITRAL VALVE                TRICUSPID VALVE MV Area (PHT): 3.03 cm     TR Peak grad:   36.0 mmHg MV Area VTI:   1.10 cm     TR Vmax:        300.00 cm/s MV Peak  grad:  9.5 mmHg MV Mean grad:  4.0 mmHg     SHUNTS MV Vmax:       1.54 m/s     Systemic VTI:  0.16 m MV Vmean:      87.6 cm/s    Systemic Diam: 2.00 cm MV Decel Time: 250 msec MR Peak grad: 43.6 mmHg MR Vmax:      330.00 cm/s MV E velocity: 145.00 cm/s MV A velocity: 88.80 cm/s MV E/A ratio:  1.63 Kirk Ruths MD Electronically signed by Kirk Ruths MD Signature Date/Time: 09/28/2020/3:31:45 PM    Final    [place] EKG SITE RITE  Result Date: 09/30/2020 If Site Rite image not attached, placement could not be confirmed due to current cardiac rhythm.    Subjective: Feeling well, happy that she will be discharge.   Discharge Exam: Vitals:   10/04/20 0412 10/04/20 0913  BP: (!) 114/101 132/90  Pulse: (!) 56 61  Resp: 14 15  Temp: 97.7 F (36.5 C) (!) 97.5 F (36.4 C)  SpO2: 97% 95%     General: Pt is alert, awake, not in acute distress Cardiovascular: RRR, S1/S2 +, no rubs, no gallops Respiratory: CTA bilaterally, no wheezing, no rhonchi Abdominal: Soft, NT, ND, bowel sounds + Extremities: no edema, no cyanosis    The results of significant diagnostics from this hospitalization (including imaging, microbiology, ancillary and laboratory) are listed below for reference.     Microbiology: Recent Results (from the past 240 hour(s))  Resp Panel by RT-PCR (Flu A&B, Covid) Nasopharyngeal Swab     Status: Abnormal   Collection Time: 09/28/20 12:11 AM   Specimen: Nasopharyngeal Swab; Nasopharyngeal(NP) swabs in vial transport medium  Result Value Ref Range Status  SARS Coronavirus 2 by RT PCR POSITIVE (A) NEGATIVE Final    Comment: RESULT CALLED TO, READ BACK BY AND VERIFIED WITH: RN KATHY SEGEL BY MESSAN H. AT [id number] ON 7 22 2022 (NOTE) SARS-CoV-2 target nucleic acids are DETECTED.  The SARS-CoV-2 RNA is generally detectable in upper respiratory specimens during the acute phase of infection. Positive results are indicative of the presence of the identified virus, but do not rule out  bacterial infection or co-infection with other pathogens not detected by the test. Clinical correlation with patient history and other diagnostic information is necessary to determine patient infection status. The expected result is Negative.  Fact Sheet for Patients: EntrepreneurPulse.com.au  Fact Sheet for Healthcare Providers: IncredibleEmployment.be  This test is not yet approved or cleared by the [place] FDA and  has been authorized for detection and/or diagnosis of SARS-CoV-2 by FDA under an Emergency Use Authorization (EUA).  This EUA will remain in effect (meaning t his test can be used) for the duration of  the COVID-19 declaration under Section 564(b)(1) of the Act, 21 U.S.C. section 360bbb-3(b)(1), unless the authorization is terminated or revoked sooner.     Influenza A by PCR NEGATIVE NEGATIVE Final   Influenza B by PCR NEGATIVE NEGATIVE Final    Comment: (NOTE) The Xpert Xpress SARS-CoV-2/FLU/RSV plus assay is intended as an aid in the diagnosis of influenza from Nasopharyngeal swab specimens and should not be used as a sole basis for treatment. Nasal washings and aspirates are unacceptable for Xpert Xpress SARS-CoV-2/FLU/RSV testing.  Fact Sheet for Patients: EntrepreneurPulse.com.au  Fact Sheet for Healthcare Providers: IncredibleEmployment.be  This test is not yet approved or cleared by the [place] FDA and has been authorized for detection and/or diagnosis of SARS-CoV-2 by FDA under an Emergency Use Authorization (EUA). This EUA will remain in effect (meaning this test can be used) for the duration of the COVID-19 declaration under Section 564(b)(1) of the Act, 21 U.S.C. section 360bbb-3(b)(1), unless the authorization is terminated or revoked.  Performed at [facility], [place] [address] [place], [place] [address]      Labs: BNP (last 3 results) Recent Labs     09/28/20 0804  BNP 841.3*   Basic Metabolic Panel: Recent Labs  Lab 09/29/20 0551 09/30/20 0545 10/01/20 0500 10/02/20 0500 10/03/20 0443  NA 138 135 135 136 136  K 4.0 3.9 3.7 3.6 3.5  CL 106 103 102 104 106  CO2 25 22 24 25 25   GLUCOSE 100* 101* 167* 91 91  BUN 18 18 18 21 20   CREATININE 0.87 0.95 1.00 0.98 1.01*  CALCIUM 9.1 8.9 8.8* 8.8* 8.6*   Liver Function Tests: Recent Labs  Lab 09/29/20 0551 09/30/20 0545 10/01/20 0500 10/02/20 0500 10/03/20 0443  AST 25 21 19 19 17   ALT 21 17 16 17 15   ALKPHOS 49 50 51 52 51  BILITOT 0.9 0.7 0.7 0.7 0.7  PROT 6.1* 5.6* 5.3* 5.4* 5.5*  ALBUMIN 3.2* 3.0* 2.8* 2.8* 2.9*   No results for input(s): LIPASE, AMYLASE in the last 168 hours. No results for input(s): AMMONIA in the last 168 hours. CBC: Recent Labs  Lab 09/28/20 0804 09/29/20 0551 09/30/20 0545 10/01/20 0500  WBC 6.5 4.7 5.8 5.4  NEUTROABS 4.3 2.7 3.8 3.4  HGB 13.2 13.0 12.8 12.0  HCT 39.5 39.0 37.9 35.8*  MCV 91.2 90.7 90.7 90.9  PLT 160 170 165 173   Cardiac Enzymes: No results for input(s): CKTOTAL, CKMB, CKMBINDEX, TROPONINI in the  last 168 hours. BNP: Invalid input(s): POCBNP CBG: No results for input(s): GLUCAP in the last 168 hours. D-Dimer No results for input(s): DDIMER in the last 72 hours. Hgb A1c No results for input(s): HGBA1C in the last 72 hours. Lipid Profile No results for input(s): CHOL, HDL, LDLCALC, TRIG, CHOLHDL, LDLDIRECT in the last 72 hours. Thyroid function studies No results for input(s): TSH, T4TOTAL, T3FREE, THYROIDAB in the last 72 hours.  Invalid input(s): FREET3 Anemia work up No results for input(s): VITAMINB12, FOLATE, FERRITIN, TIBC, IRON, RETICCTPCT in the last 72 hours. Urinalysis    Component Value Date/Time   COLORURINE YELLOW 09/26/2020 2354   APPEARANCEUR CLEAR 09/26/2020 2354   LABSPEC 1.005 09/26/2020 2354   PHURINE 6.0 09/26/2020 2354   GLUCOSEU NEGATIVE 09/26/2020 2354   HGBUR SMALL (A) 09/26/2020  2354   BILIRUBINUR NEGATIVE 09/26/2020 2354   BILIRUBINUR negative 08/09/2012 1203   KETONESUR NEGATIVE 09/26/2020 2354   PROTEINUR NEGATIVE 09/26/2020 2354   UROBILINOGEN negative 08/09/2012 1203   NITRITE NEGATIVE 09/26/2020 2354   LEUKOCYTESUR NEGATIVE 09/26/2020 2354   Sepsis Labs Invalid input(s): PROCALCITONIN,  WBC,  LACTICIDVEN Microbiology Recent Results (from the past 240 hour(s))  Resp Panel by RT-PCR (Flu A&B, Covid) Nasopharyngeal Swab     Status: Abnormal   Collection Time: 09/28/20 12:11 AM   Specimen: Nasopharyngeal Swab; Nasopharyngeal(NP) swabs in vial transport medium  Result Value Ref Range Status   SARS Coronavirus 2 by RT PCR POSITIVE (A) NEGATIVE Final    Comment: RESULT CALLED TO, READ BACK BY AND VERIFIED WITH: RN KATHY SEGEL BY MESSAN H. AT [phone] ON 7 22 2022 (NOTE) SARS-CoV-2 target nucleic acids are DETECTED.  The SARS-CoV-2 RNA is generally detectable in upper respiratory specimens during the acute phase of infection. Positive results are indicative of the presence of the identified virus, but do not rule out bacterial infection or co-infection with other pathogens not detected by the test. Clinical correlation with patient history and other diagnostic information is necessary to determine patient infection status. The expected result is Negative.  Fact Sheet for Patients: EntrepreneurPulse.com.au  Fact Sheet for Healthcare Providers: IncredibleEmployment.be  This test is not yet approved or cleared by the [place] FDA and  has been authorized for detection and/or diagnosis of SARS-CoV-2 by FDA under an Emergency Use Authorization (EUA).  This EUA will remain in effect (meaning t his test can be used) for the duration of  the COVID-19 declaration under Section 564(b)(1) of the Act, 21 U.S.C. section 360bbb-3(b)(1), unless the authorization is terminated or revoked sooner.     Influenza A by PCR NEGATIVE  NEGATIVE Final   Influenza B by PCR NEGATIVE NEGATIVE Final    Comment: (NOTE) The Xpert Xpress SARS-CoV-2/FLU/RSV plus assay is intended as an aid in the diagnosis of influenza from Nasopharyngeal swab specimens and should not be used as a sole basis for treatment. Nasal washings and aspirates are unacceptable for Xpert Xpress SARS-CoV-2/FLU/RSV testing.  Fact Sheet for Patients: EntrepreneurPulse.com.au  Fact Sheet for Healthcare Providers: IncredibleEmployment.be  This test is not yet approved or cleared by the [place] FDA and has been authorized for detection and/or diagnosis of SARS-CoV-2 by FDA under an Emergency Use Authorization (EUA). This EUA will remain in effect (meaning this test can be used) for the duration of the COVID-19 declaration under Section 564(b)(1) of the Act, 21 U.S.C. section 360bbb-3(b)(1), unless the authorization is terminated or revoked.  Performed at [facility], [place] [address], [place], [place]  [address]      Time coordinating discharge: 40 minutes  SIGNED:   Elmarie Shiley, MD  [facility]

## 2020-10-04 NOTE — TOC Initial Note (Signed)
Transition of Care [facility]) - Initial/Assessment Note    Patient Details  Name: [name] MRN: [id number] Date of Birth: 16-Jan-1929  Transition of Care [facility]) CM/SW Contact:    Erenest Rasher, RN Phone Number: [phone] 10/04/2020, 12:05 PM  Clinical Narrative:                 TOC CM spoke to pt and states her son lives in the home and her daughters assist her in the home. She has RW and cane at home. Her children provide transportation to appts. Provided pt with Eliquis 30 day free trial card. Contacted her pharmacy and they have Eliquis in stock, will provide pt with 3 day supply and order the rest of medication, pharmacy did not have entire 60 day supply. Her copay is $45.   Expected Discharge Plan: Home/Self Care Barriers to Discharge: No Barriers Identified   Patient Goals and CMS Choice Patient states their goals for this hospitalization and ongoing recovery are:: able to manage at home with family      Expected Discharge Plan and Services Expected Discharge Plan: Home/Self Care In-house Referral: Clinical Social Work Discharge Planning Services: CM Consult   Living arrangements for the past 2 months: Single Family Home Expected Discharge Date: 10/04/20                                    Prior Living Arrangements/Services Living arrangements for the past 2 months: Single Family Home Lives with:: Adult Children Patient language and need for interpreter reviewed:: Yes Do you feel safe going back to the place where you live?: Yes      Need for Family Participation in Patient Care: No (Comment) Care giver support system in place?: No (comment) Current home services: DME (rolling walker, cane) Criminal Activity/Legal Involvement Pertinent to Current Situation/Hospitalization: No - Comment as needed  Activities of Daily Living Home Assistive Devices/Equipment: Walker (specify type) ADL Screening (condition at time of admission) Patient's cognitive ability  adequate to safely complete daily activities?: Yes Is the patient deaf or have difficulty hearing?: No Does the patient have difficulty seeing, even when wearing glasses/contacts?: No Does the patient have difficulty concentrating, remembering, or making decisions?: No Patient able to express need for assistance with ADLs?: No Does the patient have difficulty dressing or bathing?: No Independently performs ADLs?: Yes (appropriate for developmental age) Does the patient have difficulty walking or climbing stairs?: Yes Weakness of Legs: Both Weakness of Arms/Hands: None  Permission Sought/Granted Permission sought to share information with : Case Manager, PCP, Family Supports Permission granted to share information with : Yes, Verbal Permission Granted  Share Information with NAME: [name]     Permission granted to share info w Relationship: daughter  Permission granted to share info w Contact Information: [phone]  Emotional Assessment       Orientation: : Oriented to Self, Oriented to Place, Oriented to  Time, Oriented to Situation   Psych Involvement: No (comment)  Admission diagnosis:  Atrial fibrillation with RVR ([place]) [I48.91] Paroxysmal A-fib ([place]) [I48.0] Patient Active Problem List   Diagnosis Date Noted   Paroxysmal A-fib ([place]) 09/28/2020   Atrial fibrillation with RVR ([place]) 09/27/2020   Palpitations    Fever    TIA (transient ischemic attack) 08/08/2020   Senile dementia without behavioral disturbance ([place]) 07/09/2020   Paroxysmal atrial fibrillation ([place]) 04/09/2020   Cardiomyopathy ([place]) 04/09/2020   Iron  deficiency anemia due to chronic blood loss 01/12/2017   Hypothyroidism 01/06/2017   RBBB 10/12/2013   Varicose veins 10/12/2013   Weakness 11/06/2011   Orthostatic hypotension 11/06/2011   Mitral valve failure 10/09/2010   PULMONARY HYPERTENSION, MILD 09/26/2009   DIVERTICULOSIS, COLON 09/26/2009   TRANSIENT ISCHEMIC ATTACKS, HX OF 09/26/2009    COLONIC POLYPS, ADENOMATOUS, HX OF 09/26/2009   ISCHEMIC COLITIS, HX OF 09/26/2009   Essential hypertension 08/26/2008   PCP:  [name], MD Pharmacy:   [facility] [place] [address] Phone: [phone] Fax: [phone]  [facility] [address], [place] [address] [place] [address] Phone: [phone] Fax: [phone]     Social Determinants of Health (SDOH) Interventions    Readmission Risk Interventions No flowsheet data found.

## 2020-10-04 NOTE — Progress Notes (Signed)
Patient ID: [name], female   DOB: 29-Jan-1929, 87 y.o.   MRN: [id number]      87 y.o. f/u post MV repair TTE 01/12/19 showed trace MR with low diastolic gradients and normal EF 60%.  She has HTN on ARB More dizziness on 50 mg Cozaar dose decreased. She thinks statin Makes her feel worse and now taking Qod.  Some LE varicosities   Depression continues. Husband [name] was a patient of mine and died of lung cancer 6 years ago They were married 68 years. Shortly after that [name] died tragically in there back yard of a chain saw accident. Son [name] living with her and two daughters near by.   Has had some memory issues and has been on namenda and aricept  Hospitalized 09/27/20 with COVID causing afib and new DCM with TTE showing EF 30-35% MV repair fine with trivial MR no MS mean gradient 4 mmhg  She converted on amiodarone with SSS and some bradycardia Aricept d/c  Started on eliquis 2.5 mg bid and low dose Toprol 25 mg Low dose ARB added  As well   Doing great since d/c No palpitations, dyspnea or memory issues   ROS: Denies fever, malais, weight loss, blurry vision, decreased visual acuity, cough, sputum, SOB, hemoptysis, pleuritic pain, palpitaitons, heartburn, abdominal pain, melena, lower extremity edema, claudication, or rash.  All other systems reviewed and negative  BP (!) 142/68   Pulse 68   Ht 5\' 3"  (1.6 m)   Wt 44.2 kg   SpO2 95%   BMI 17.27 kg/m   Affect appropriate Healthy:  appears stated age 42: normal Neck supple with no adenopathy JVP normal no bruits no thyromegaly Lungs clear with no wheezing and good diaphragmatic motion Heart:  S1/S2 no murmur, no rub, gallop or click PMI normal  Post sternotomy  Abdomen: benighn, BS positve, no tenderness, no AAA no bruit.  No HSM or HJR Distal pulses intact with no bruits No edema Neuro non-focal Skin warm and dry No muscular weakness   Current Outpatient Medications  Medication Sig Dispense Refill    amiodarone (PACERONE) 200 MG tablet Take 1 tablet twice a day for 10 days, then take 1 tablet daily. 60 tablet 1   apixaban (ELIQUIS) 2.5 MG TABS tablet Take 1 tablet (2.5 mg total) by mouth 2 (two) times daily. 60 tablet 3   ascorbic acid (VITAMIN C) 500 MG tablet Take 1 tablet (500 mg total) by mouth daily. 30 tablet 0   atorvastatin (LIPITOR) 10 MG tablet Take 1 tablet (10 mg total) by mouth daily. 90 tablet 3   Biotin 1000 MCG tablet Take 1,000 mcg by mouth daily.     Cholecalciferol (VITAMIN D3) 10000 UNITS capsule Take 10,000 Units by mouth daily.       folic acid (FOLVITE) 1 MG tablet Take 1 tablet (1 mg total) by mouth daily. 100 tablet 3   losartan (COZAAR) 50 MG tablet Take 25 mg by mouth daily.     memantine (NAMENDA) 10 MG tablet Take 1 tablet (10 mg total) by mouth 2 (two) times daily. Start this only after finishing titration pack 60 tablet 3   metoprolol succinate (TOPROL-XL) 25 MG 24 hr tablet Take 1 tablet (25 mg total) by mouth daily. 30 tablet 3   zinc sulfate 220 (50 Zn) MG capsule Take 1 capsule (220 mg total) by mouth daily. 30 capsule 0   No current facility-administered medications for this visit.    Allergies  Actonel [risedronate sodium], Aspirin-caffeine, Codeine, Penicillins, and Sulfonamide derivatives  Electrocardiogram:   12/02/17  SR rate 62 LAD RBBB 10/09/2020 SR rate 58 RBBB/LAD    Assessment and Plan  MV Repair:  SBE prophylaxis discussed  Echo 09/28/20 EF 60-65% with only trace MR no MS mean gradient 4 mmHg   HTN: Well controlled.  Continue current medications and low sodium Dash type diet.    Thyroid:  On replacement TSH 3.14 September 2019 normal   PAF: IN NSR decrease amiodarone to 200 mg daily continue low dose eliquis   DCM:  hopefully related to COVID infection and afib Should improve in NSR f/u echo in 3 months continue low dose beta blocker / ARB She is euvolemic today   Memory: Aricept d/c in hospital due to bradycardia   F/U in 3 months   [name]

## 2020-10-09 ENCOUNTER — Other Ambulatory Visit: Payer: Self-pay

## 2020-10-09 ENCOUNTER — Ambulatory Visit: Payer: Medicare HMO | Admitting: Cardiovascular Disease

## 2020-10-09 ENCOUNTER — Telehealth: Payer: Self-pay | Admitting: Cardiovascular Disease

## 2020-10-09 ENCOUNTER — Encounter: Payer: Self-pay | Admitting: Cardiovascular Disease

## 2020-10-09 VITALS — BP 142/68 | HR 68 | Ht 63.0 in | Wt 97.5 lb

## 2020-10-09 DIAGNOSIS — Z0279 Encounter for issue of other medical certificate: Secondary | ICD-10-CM

## 2020-10-09 DIAGNOSIS — Z9889 Other specified postprocedural states: Secondary | ICD-10-CM | POA: Diagnosis not present

## 2020-10-09 DIAGNOSIS — F0150 Vascular dementia without behavioral disturbance: Secondary | ICD-10-CM

## 2020-10-09 DIAGNOSIS — I420 Dilated cardiomyopathy: Secondary | ICD-10-CM | POA: Diagnosis not present

## 2020-10-09 DIAGNOSIS — U071 COVID-19: Secondary | ICD-10-CM | POA: Diagnosis not present

## 2020-10-09 DIAGNOSIS — I480 Paroxysmal atrial fibrillation: Secondary | ICD-10-CM | POA: Diagnosis not present

## 2020-10-09 NOTE — Telephone Encounter (Signed)
Forms from [facility] for [employer] received on 10/09/20. Completed patient authorization attached. Forms completed by MD and faxed by Cathey(Nurse) to [facility].  AB/Aug.2, 2022

## 2020-10-09 NOTE — Patient Instructions (Signed)
Medication Instructions:  Your physician recommends that you continue on your current medications as directed. Please refer to the Current Medication list given to you today.  *If you need a refill on your cardiac medications before your next appointment, please call your pharmacy*   Lab Work: NONE   If you have labs (blood work) drawn today and your tests are completely normal, you will receive your results only by: . MyChart Message (if you have MyChart) OR . A paper copy in the mail If you have any lab test that is abnormal or we need to change your treatment, we will call you to review the results.   Testing/Procedures: Your physician has requested that you have an echocardiogram. Echocardiography is a painless test that uses sound waves to create images of your heart. It provides your doctor with information about the size and shape of your heart and how well your heart's chambers and valves are working. This procedure takes approximately one hour. There are no restrictions for this procedure.   Follow-Up: At [facility], you and your health needs are our priority.  As part of our continuing mission to provide you with exceptional heart care, we have created designated Provider Care Teams.  These Care Teams include your primary Cardiologist (physician) and Advanced Practice Providers (APPs -  Physician Assistants and Nurse Practitioners) who all work together to provide you with the care you need, when you need it.  We recommend signing up for the patient portal called "MyChart".  Sign up information is provided on this After Visit Summary.  MyChart is used to connect with patients for Virtual Visits (Telemedicine).  Patients are able to view lab/test results, encounter notes, upcoming appointments, etc.  Non-urgent messages can be sent to your provider as well.   To learn more about what you can do with MyChart, go to [email or web address].    Your next appointment:   3  month(s)  The format for your next appointment:   In Person  Provider:   Peter Nishan, MD   Other Instructions Thank you for choosing [facility]!    

## 2020-10-09 NOTE — Addendum Note (Signed)
Addended by: Levonne Hubert on: 10/09/2020 10:08 AM   Modules accepted: Orders

## 2020-10-10 ENCOUNTER — Ambulatory Visit: Payer: Medicare HMO | Admitting: Cardiovascular Disease

## 2020-10-15 ENCOUNTER — Ambulatory Visit: Payer: Medicare HMO | Admitting: Neurology

## 2020-10-15 ENCOUNTER — Ambulatory Visit (INDEPENDENT_AMBULATORY_CARE_PROVIDER_SITE_OTHER): Payer: Medicare HMO | Admitting: *Deleted

## 2020-10-15 ENCOUNTER — Telehealth: Payer: Self-pay | Admitting: *Deleted

## 2020-10-15 DIAGNOSIS — I480 Paroxysmal atrial fibrillation: Secondary | ICD-10-CM

## 2020-10-15 DIAGNOSIS — I10 Essential (primary) hypertension: Secondary | ICD-10-CM | POA: Diagnosis not present

## 2020-10-15 DIAGNOSIS — F0390 Unspecified dementia without behavioral disturbance: Secondary | ICD-10-CM | POA: Diagnosis not present

## 2020-10-15 NOTE — Telephone Encounter (Signed)
10/15/2020  Discharged on 10/04/20 after being hospitalized with Afib and Covid. Instructed to follow-up with PCP in 1-2 weeks and to have a repeat BMP/CBC. Patient is scheduled for a 3 month f/u with Dr Livia Snellen on 10/31/20. She has seen her cardiologist since discharge. Overall she feels like she is getting stronger and she does not have any complaints of shortness of breath or other lingering symptoms.   Forwarding to [facility] Clinical Staff to review and determine if patient needs to be seen before routine visit on 10/31/20 for hospital f/u and bloodwork.  Chong Sicilian, BSN, RN-BC Embedded Chronic Care Manager [facility] [place] [facility] Direct Dial: [phone]

## 2020-10-15 NOTE — Telephone Encounter (Signed)
No answer, no voicemail.  Patient does need to be seen for her hospital follow up before her scheduled appointment on 10/31/20 for 3 month follow up.  She needs to be seen so that we may do repeat labwork.

## 2020-10-16 ENCOUNTER — Encounter: Payer: Self-pay | Admitting: *Deleted

## 2020-10-16 NOTE — Chronic Care Management (AMB) (Addendum)
Chronic Care Management   CCM RN Visit Note  10/15/2020 Name: [name] MRN: [id number] DOB: 1928/11/13  Subjective: [name] is a 87 y.o. year old female who is a primary care patient of Stacks, Cletus Gash, MD. The care management team was consulted for assistance with disease management and care coordination needs.    Engaged with patient by telephone for follow up visit in response to provider referral for case management and/or care coordination services.   Consent to Services:  The patient was given information about Chronic Care Management services, agreed to services, and gave verbal consent prior to initiation of services.  Please see initial visit note for detailed documentation.   Patient agreed to services and verbal consent obtained.   Assessment: Review of patient past medical history, allergies, medications, health status, including review of consultants reports, laboratory and other test data, was performed as part of comprehensive evaluation and provision of chronic care management services.   SDOH (Social Determinants of Health) assessments and interventions performed:    CCM Care Plan  Allergies  Allergen Reactions   Actonel [Risedronate Sodium] Nausea And Vomiting   Aspirin-Caffeine     Upset stomach    Codeine     REACTION: upset stomach   Penicillins    Sulfonamide Derivatives     REACTION: swollen tongue    Outpatient Encounter Medications as of 10/15/2020  Medication Sig   amiodarone (PACERONE) 200 MG tablet Take 1 tablet twice a day for 10 days, then take 1 tablet daily.   apixaban (ELIQUIS) 2.5 MG TABS tablet Take 1 tablet (2.5 mg total) by mouth 2 (two) times daily.   ascorbic acid (VITAMIN C) 500 MG tablet Take 1 tablet (500 mg total) by mouth daily.   atorvastatin (LIPITOR) 10 MG tablet Take 1 tablet (10 mg total) by mouth daily.   Biotin 1000 MCG tablet Take 1,000 mcg by mouth daily.   Cholecalciferol (VITAMIN D3) 10000 UNITS capsule Take 10,000  Units by mouth daily.     folic acid (FOLVITE) 1 MG tablet Take 1 tablet (1 mg total) by mouth daily.   losartan (COZAAR) 50 MG tablet Take 25 mg by mouth daily.   memantine (NAMENDA) 10 MG tablet Take 1 tablet (10 mg total) by mouth 2 (two) times daily. Start this only after finishing titration pack   metoprolol succinate (TOPROL-XL) 25 MG 24 hr tablet Take 1 tablet (25 mg total) by mouth daily.   zinc sulfate 220 (50 Zn) MG capsule Take 1 capsule (220 mg total) by mouth daily.   No facility-administered encounter medications on file as of 10/15/2020.    Patient Active Problem List   Diagnosis Date Noted   Paroxysmal A-fib ([place]) 09/28/2020   Atrial fibrillation with RVR ([place]) 09/27/2020   Palpitations    Fever    TIA (transient ischemic attack) 08/08/2020   Senile dementia without behavioral disturbance ([place]) 07/09/2020   Paroxysmal atrial fibrillation ([place]) 04/09/2020   Cardiomyopathy ([place]) 04/09/2020   Iron deficiency anemia due to chronic blood loss 01/12/2017   Hypothyroidism 01/06/2017   RBBB 10/12/2013   Varicose veins 10/12/2013   Weakness 11/06/2011   Orthostatic hypotension 11/06/2011   Mitral valve failure 10/09/2010   PULMONARY HYPERTENSION, MILD 09/26/2009   DIVERTICULOSIS, COLON 09/26/2009   TRANSIENT ISCHEMIC ATTACKS, HX OF 09/26/2009   COLONIC POLYPS, ADENOMATOUS, HX OF 09/26/2009   ISCHEMIC COLITIS, HX OF 09/26/2009   Essential hypertension 08/26/2008    Conditions to be addressed/monitored:Atrial Fibrillation, HTN, and Dementia  Care Plan : [facility] Care Plan  Updates made by Ilean China, RN since 10/17/2020 12:00 AM     Problem: Chronic Disease Management Needs   Priority: Medium     Long-Range Goal: Care Management & Care Coordination Needs Associated with Hypertension, Dementia, and Afib   This Visit's Progress: On track  Priority: Medium  Note:   Current Barriers:  Chronic Disease Management support and education needs related to Atrial  Fibrillation, HTN, and Dementia: Vascular/Multi-infarct dementia with behavioral disturbance  RNCM Clinical Goal(s):  Patient will verbalize understanding of plan for management of Atrial Fibrillation, HTN, and Dementia take all medications exactly as prescribed and will call provider for medication related questions demonstrate ongoing adherence to prescribed treatment plan for Atrial Fibrillation, HTN, and Dementia as evidenced by adherence to prescribed medication regimen contacting provider for new or worsened symptoms or questions attending all scheduled medical appointments   continue to work with RN Care Manager to address care management and care coordination needs related to Atrial Fibrillation, HTN, and Dementia  through collaboration with RN Care manager, provider, and care team.   Interventions: 1:1 collaboration with primary care provider regarding development and update of comprehensive plan of care as evidenced by provider attestation and co-signature Inter-disciplinary care team collaboration (see longitudinal plan of care) Evaluation of current treatment plan related to  self management and patient's adherence to plan as established by provider Chart reviewed including relevant office notes, lab results, imaging reports, hospital notes, and correspondence reports Discussed plans with patient for ongoing care management follow up and provided patient with direct contact information for care management team Medications reviewed and importance of compliance discussed. Medication list updated as needed. Collaborated with [facility] [address] Clinical staff regarding need for hospital follow-up s/p discharge on 10/04/20   A-fib:  (Status: New goal.) Counseled on increased risk of stroke due to Afib and benefits of anticoagulation for stroke prevention Reviewed importance of adherence to anticoagulant exactly as prescribed Counseled on bleeding risk associated with eliquis and importance of  self-monitoring for signs/symptoms of bleeding Counseled on avoidance of NSAIDs due to increased bleeding risk with anticoagulants Counseled on seeking medical attention after a head injury or if there is blood in the urine/stool Afib action plan reviewed Assessed social determinant of health barriers   Dementia  (Status: Goal on track: YES.) Assessed social determinant of health barriers Discussed current state of health Patient feels that her dementia is well managed at this time.  She is no longer driving although she has her driver's license. She does miss driving but knows that it isn't safe. Acknowledged that patient is very alert and oriented and responses were appropriate. She was not confused and seemed to be more clear headed than she has been previously.  Discussed medications and patient was able to tell me which medications she is taking and which ones she was told to hold after hospital discharge.  Discussed family/social support Has support from daughters and grandaughter Encouraged to continue performing ADLs and to remain socially active and to increase physical activity as tolerated Reviewed upcoming appointments and encouraged to keep all medical appointments Advised to reach out to PCP or neurologist with any new or worsening sympotms or with any medication concerns  Hypertension: (Status: Goal on track: YES.) Last practice recorded BP readings:  BP Readings from Last 3 Encounters:  10/09/20 (!) 142/68  10/04/20 132/90  08/02/20 (!) 134/58  Most recent eGFR/CrCl: No results found for: EGFR  No components found for: CRCL  Advised patient, providing education and rationale, to monitor blood pressure daily and record, calling PCP for findings outside established parameters;  Discussed complications of poorly controlled blood pressure such as heart disease, stroke, circulatory complications, vision complications, kidney impairment, sexual dysfunction;  Reviewed  medications and recent changes Reviewed upcoming appointments  Patient Goals/Self-Care Activities: Patient will self administer medications as prescribed Patient will attend all scheduled provider appointments Patient will continue to perform ADL's independently Patient will call provider office for new concerns or questions       Plan:Telephone follow up appointment with care management team member scheduled for:  11/05/20 with RNCM and The patient has been provided with contact information for the care management team and has been advised to call with any health related questions or concerns.   Chong Sicilian, BSN, RN-BC Embedded Chronic Care Manager [facility] [place] [facility] Direct Dial: [phone]

## 2020-10-17 NOTE — Patient Instructions (Signed)
Visit Information  PATIENT GOALS:  Goals Addressed             This Visit's Progress    Optimize Cognitive Functioning   On track    Timeframe:  Long-Range Goal Priority:  High Start Date: 07/17/20                            Expected End Date:   03/09/21                    Follow Up Date 11/05/20   Talk with friends/family daily Take Folic Acid and Namenda daily. Aricept was stopped during hospital stay. Use a pill box to organize your medications Do puzzles, word search, sodoku, etc Exercise as tolerated Get outside everyday Keep all medical appointments Call RN Care Manager as needed [phone] Ask family/friends to assist in driving to appointments and to run errands    Why is this important?   As we age, or sometimes because we have an illness, it feels like our memory and ability to figure things out is not very good.  There are things you can do to keep your memory and your thinking as strong as possible.    Notes:      Track and Manage Heart Rate and Rhythm-Atrial Fibrillation   On track    Timeframe:  Long-Range Goal Priority:  Medium Start Date: 10/15/20                            Expected End Date: 10/15/21                       Follow Up Date 11/05/20   Take Eliquis, metoprolol, and amiodarone as directed Monitor and record heart rate daily and as needed Call cardiologist or PCP if your heart rate is irregular or if it is beating too fast or too slow. Normal range is 60-100 beats per minute Keep all medical appointments Call RN Care Manager as needed [phone]    Why is this important?   Atrial fibrillation may have no symptoms. Sometimes the symptoms get worse or happen more often.  It is important to keep track of what your symptoms are and when they happen.  A change in symptoms is important to discuss with your doctor or nurse.  Being active and healthy eating will also help you manage your heart condition.     Notes:      Track and Manage My  Blood Pressure-Hypertension   On track    Timeframe:  Long-Range Goal Priority:  Medium Start Date:   05/07/20                          Expected End Date: 11/04/20                      Follow Up Date 11/05/20   Check and record blood pressure daily Call PCP or cardiologist if blood pressure is running too high or too low Take medications as prescribed Call [place] [phone] as needed Follow a low sodium diet and don't add extra salt to food   Why is this important?   You won't feel high blood pressure, but it can still hurt your blood vessels.  High blood pressure can cause heart or kidney problems. It can also  cause a stroke.  Making lifestyle changes like losing a little weight or eating less salt will help.  Checking your blood pressure at home and at different times of the day can help to control blood pressure.  If the doctor prescribes medicine remember to take it the way the doctor ordered.  Call the office if you cannot afford the medicine or if there are questions about it.     Notes:         The patient verbalized understanding of instructions, educational materials, and care plan provided today and declined offer to receive copy of patient instructions, educational materials, and care plan.   Plan:Telephone follow up appointment with care management team member scheduled for:  11/05/20 with RNCM and The patient has been provided with contact information for the care management team and has been advised to call with any health related questions or concerns.   Chong Sicilian, BSN, RN-BC Embedded Chronic Care Manager [facility] [place] [facility] Direct Dial: [phone]

## 2020-10-31 ENCOUNTER — Ambulatory Visit (INDEPENDENT_AMBULATORY_CARE_PROVIDER_SITE_OTHER): Payer: Medicare HMO | Admitting: Family Medicine

## 2020-10-31 ENCOUNTER — Ambulatory Visit: Payer: Medicare HMO | Admitting: Family Medicine

## 2020-10-31 ENCOUNTER — Other Ambulatory Visit: Payer: Self-pay

## 2020-10-31 ENCOUNTER — Other Ambulatory Visit (HOSPITAL_COMMUNITY): Payer: Medicare HMO

## 2020-10-31 ENCOUNTER — Encounter: Payer: Self-pay | Admitting: Family Medicine

## 2020-10-31 VITALS — BP 144/88 | HR 68 | Temp 97.7°F | Ht 63.0 in | Wt 101.0 lb

## 2020-10-31 DIAGNOSIS — F0390 Unspecified dementia without behavioral disturbance: Secondary | ICD-10-CM

## 2020-10-31 DIAGNOSIS — Z09 Encounter for follow-up examination after completed treatment for conditions other than malignant neoplasm: Secondary | ICD-10-CM | POA: Diagnosis not present

## 2020-10-31 DIAGNOSIS — E782 Mixed hyperlipidemia: Secondary | ICD-10-CM

## 2020-10-31 DIAGNOSIS — I10 Essential (primary) hypertension: Secondary | ICD-10-CM

## 2020-10-31 DIAGNOSIS — I480 Paroxysmal atrial fibrillation: Secondary | ICD-10-CM

## 2020-10-31 NOTE — Progress Notes (Signed)
Established Patient Office Visit  Subjective:  Patient ID: [name], female    DOB: Aug 10, 1928  Age: 87 y.o. MRN: [id number]  CC:  Chief Complaint  Patient presents with   Hospitalization Follow-up    HPI JOYCE HEITMAN presents for hospital follow up. She was admitted on 09/26/20 for A.fib with RVR and Covid 19. Was discharged on 10/04/20 after 5 days of remdesivir treatment. She was started and discharged on amiodarone. Her acricept was discontinued. She saw cardiology on 10/09/20 for follow up. Her BP was well controlled. She is currently of cozaar 25 mg and toprol-xl 25 mg. She has be unable to tolerate higher dosages. She is currently only taking her eliquis once a day rather than BID as ordered.   She reports that she is feeling much better, just feeling weak. Reports weakness is improving. She denies shortness of breath, edema, changes in vision or speech, focal weakness, chest pain, dizziness, or palpitations. Her granddaughter is the one who does her pill boxes. Her daughter is with her today.   Past Medical History:  Diagnosis Date   Arthritis    Atrial fibrillation (HCC)    postoperative   COVID-19    CVA (cerebral vascular accident) (HCC)    HTN (hypertension)    MR (mitral regurgitation)    severe   NSTEMI (non-ST elevated myocardial infarction) (HCC)    Pericarditis    postoperative    Past Surgical History:  Procedure Laterality Date   ANTERIOR AND POSTERIOR VAGINAL REPAIR  10/15/04   Dr. Jennette Kettle    anterior repair with perigee graft  04/16/06   posterior repair with apogee graft; lynx mid urethral sling; sacrospinous ligamnet suspension and cystoscopy; Dr. Lovina Reach   MITRAL VALVE REPLACEMENT     sacral spinous ligament suspension of vagina     suprapubic cystectomy     TOTAL ABDOMINAL HYSTERECTOMY W/ BILATERAL SALPINGOOPHORECTOMY      Family History  Problem Relation Age of Onset   Heart disease Father    Lupus Father    Heart attack Father    Heart disease  Mother    Colon cancer Brother    Heart disease Brother     Social History   Socioeconomic History   Marital status: Widowed    Spouse name: Not on file   Number of children: 3   Years of education: Not on file   Highest education level: 9th grade  Occupational History   Occupation: Retired  Tobacco Use   Smoking status: Never   Smokeless tobacco: Never  Vaping Use   Vaping Use: Never used  Substance and Sexual Activity   Alcohol use: No   Drug use: No   Sexual activity: Not on file  Other Topics Concern   Not on file  Social History Narrative   Lives with son   Right Handed    Drinks 2-3  cups caffeine daily   Social Determinants of Health   Financial Resource Strain: Low Risk    Difficulty of Paying Living Expenses: Not hard at all  Food Insecurity: No Food Insecurity   Worried About Programme researcher, broadcasting/film/video in the Last Year: Never true   Barista in the Last Year: Never true  Transportation Needs: No Transportation Needs   Lack of Transportation (Medical): No   Lack of Transportation (Non-Medical): No  Physical Activity: Not on file  Stress: Not on file  Social Connections: Moderately Integrated   Frequency of Communication with Friends  and Family: More than three times a week   Frequency of Social Gatherings with Friends and Family: More than three times a week   Attends Religious Services: More than 4 times per year   Active Member of Clubs or Organizations: Yes   Attends Archivist Meetings: More than 4 times per year   Marital Status: Widowed  Intimate Partner Violence: Not on file    Outpatient Medications Prior to Visit  Medication Sig Dispense Refill   amiodarone (PACERONE) 200 MG tablet Take 1 tablet twice a day for 10 days, then take 1 tablet daily. 60 tablet 1   apixaban (ELIQUIS) 2.5 MG TABS tablet Take 1 tablet (2.5 mg total) by mouth 2 (two) times daily. 60 tablet 3   ascorbic acid (VITAMIN C) 500 MG tablet Take 1 tablet (500 mg  total) by mouth daily. 30 tablet 0   atorvastatin (LIPITOR) 10 MG tablet Take 1 tablet (10 mg total) by mouth daily. 90 tablet 3   Biotin 1000 MCG tablet Take 1,000 mcg by mouth daily.     Cholecalciferol (VITAMIN D3) 10000 UNITS capsule Take 10,000 Units by mouth daily.       folic acid (FOLVITE) 1 MG tablet Take 1 tablet (1 mg total) by mouth daily. 100 tablet 3   losartan (COZAAR) 50 MG tablet Take 25 mg by mouth daily.     memantine (NAMENDA) 10 MG tablet Take 1 tablet (10 mg total) by mouth 2 (two) times daily. Start this only after finishing titration pack 60 tablet 3   metoprolol succinate (TOPROL-XL) 25 MG 24 hr tablet Take 1 tablet (25 mg total) by mouth daily. 30 tablet 3   zinc sulfate 220 (50 Zn) MG capsule Take 1 capsule (220 mg total) by mouth daily. 30 capsule 0   No facility-administered medications prior to visit.    Allergies  Allergen Reactions   Actonel [Risedronate Sodium] Nausea And Vomiting   Aspirin-Caffeine     Upset stomach    Codeine     REACTION: upset stomach   Penicillins    Sulfonamide Derivatives     REACTION: swollen tongue    ROS Review of Systems As per HPI.    Objective:    Physical Exam Vitals and nursing note reviewed.  Constitutional:      General: She is not in acute distress.    Appearance: She is not ill-appearing, toxic-appearing or diaphoretic.  Eyes:     Extraocular Movements: Extraocular movements intact.     Pupils: Pupils are equal, round, and reactive to light.  Cardiovascular:     Rate and Rhythm: Normal rate and regular rhythm.     Heart sounds: Normal heart sounds. No murmur heard. Pulmonary:     Effort: Pulmonary effort is normal. No respiratory distress.     Breath sounds: Normal breath sounds. No stridor. No wheezing, rhonchi or rales.  Chest:     Chest wall: No tenderness.  Abdominal:     General: Bowel sounds are normal. There is no distension.     Palpations: Abdomen is soft.     Tenderness: There is no  abdominal tenderness. There is no guarding or rebound.  Musculoskeletal:     Right lower leg: No edema.     Left lower leg: No edema.  Skin:    General: Skin is warm and dry.  Neurological:     General: No focal deficit present.     Mental Status: She is alert. Mental status is at  baseline.  Psychiatric:        Mood and Affect: Mood normal.        Behavior: Behavior normal.    BP (!) 173/111   Pulse 68   Temp 97.7 F (36.5 C) (Temporal)   Ht $R'5\' 3"'IY$  (1.6 m)   Wt 101 lb (45.8 kg)   BMI 17.89 kg/m  Wt Readings from Last 3 Encounters:  10/31/20 101 lb (45.8 kg)  10/09/20 97 lb 8 oz (44.2 kg)  10/04/20 95 lb 0.3 oz (43.1 kg)     Health Maintenance Due  Topic Date Due   COVID-19 Vaccine (4 - Booster for Moderna series) 06/07/2020    There are no preventive care reminders to display for this patient.  Lab Results  Component Value Date   TSH 3.945 09/27/2020   Lab Results  Component Value Date   WBC 5.4 10/01/2020   HGB 12.0 10/01/2020   HCT 35.8 (L) 10/01/2020   MCV 90.9 10/01/2020   PLT 173 10/01/2020   Lab Results  Component Value Date   NA 136 10/03/2020   K 3.5 10/03/2020   CO2 25 10/03/2020   GLUCOSE 91 10/03/2020   BUN 20 10/03/2020   CREATININE 1.01 (H) 10/03/2020   BILITOT 0.7 10/03/2020   ALKPHOS 51 10/03/2020   AST 17 10/03/2020   ALT 15 10/03/2020   PROT 5.5 (L) 10/03/2020   ALBUMIN 2.9 (L) 10/03/2020   CALCIUM 8.6 (L) 10/03/2020   ANIONGAP 5 10/03/2020   GFR 62.20 11/06/2011   Lab Results  Component Value Date   CHOL 153 04/26/2020   Lab Results  Component Value Date   HDL 66 04/26/2020   Lab Results  Component Value Date   LDLCALC 73 04/26/2020   Lab Results  Component Value Date   TRIG 69 04/26/2020   Lab Results  Component Value Date   CHOLHDL 2.3 04/26/2020   No results found for: HGBA1C    Assessment & Plan:   Aaleah was seen today for hospitalization follow-up.  Diagnoses and all orders for this visit:  Paroxysmal  atrial fibrillation [facility]) Managed by cardiology. On amiodarone. Discussed importance of taking eliquis as prescribed.  -     CBC with Differential/Platelet -     CMP14+EGFR  Essential hypertension Well controlled on current regimen. On toprol and cozaar. Labs pending.  -     CBC with Differential/Platelet -     CMP14+EGFR  Mixed hyperlipidemia On statin.   Senile dementia without behavioral disturbance ([place]) Aricpet was discontinued. She continue on namenda.   Hospital discharge follow-up Reviewed hospital record.    Follow-up: Return in about 3 months (around 01/31/2021) for with PCP for chronic follow up.  The patient indicates understanding of these issues and agrees with the plan.    Gwenlyn Perking, FNP

## 2020-10-31 NOTE — Patient Instructions (Signed)

## 2020-11-01 LAB — CBC WITH DIFFERENTIAL/PLATELET
Basophils Absolute: 0.1 10*3/uL (ref 0.0–0.2)
Basos: 1 %
EOS (ABSOLUTE): 0.3 10*3/uL (ref 0.0–0.4)
Eos: 4 %
Hematocrit: 36.4 % (ref 34.0–46.6)
Hemoglobin: 12.3 g/dL (ref 11.1–15.9)
Immature Grans (Abs): 0 10*3/uL (ref 0.0–0.1)
Immature Granulocytes: 1 %
Lymphocytes Absolute: 1.5 10*3/uL (ref 0.7–3.1)
Lymphs: 24 %
MCH: 30.7 pg (ref 26.6–33.0)
MCHC: 33.8 g/dL (ref 31.5–35.7)
MCV: 91 fL (ref 79–97)
Monocytes Absolute: 0.5 10*3/uL (ref 0.1–0.9)
Monocytes: 8 %
Neutrophils Absolute: 4 10*3/uL (ref 1.4–7.0)
Neutrophils: 62 %
Platelets: 198 10*3/uL (ref 150–450)
RBC: 4.01 x10E6/uL (ref 3.77–5.28)
RDW: 14 % (ref 11.7–15.4)
WBC: 6.4 10*3/uL (ref 3.4–10.8)

## 2020-11-01 LAB — CMP14+EGFR
ALT: 13 IU/L (ref 0–32)
AST: 18 IU/L (ref 0–40)
Albumin/Globulin Ratio: 1.7 (ref 1.2–2.2)
Albumin: 4.2 g/dL (ref 3.5–4.6)
Alkaline Phosphatase: 66 IU/L (ref 44–121)
BUN/Creatinine Ratio: 18 (ref 12–28)
BUN: 18 mg/dL (ref 10–36)
Bilirubin Total: 0.4 mg/dL (ref 0.0–1.2)
CO2: 28 mmol/L (ref 20–29)
Calcium: 9.7 mg/dL (ref 8.7–10.3)
Chloride: 100 mmol/L (ref 96–106)
Creatinine, Ser: 0.98 mg/dL (ref 0.57–1.00)
Globulin, Total: 2.5 g/dL (ref 1.5–4.5)
Glucose: 83 mg/dL (ref 65–99)
Potassium: 4.2 mmol/L (ref 3.5–5.2)
Sodium: 139 mmol/L (ref 134–144)
Total Protein: 6.7 g/dL (ref 6.0–8.5)
eGFR: 55 mL/min/{1.73_m2} — ABNORMAL LOW (ref >59)

## 2020-11-01 NOTE — Progress Notes (Signed)
Normal labs Per policy this encounter can be closed.

## 2020-11-05 ENCOUNTER — Telehealth: Payer: Medicare HMO | Admitting: *Deleted

## 2020-11-13 ENCOUNTER — Ambulatory Visit: Payer: Medicare HMO | Admitting: Neurology

## 2020-12-27 ENCOUNTER — Ambulatory Visit: Payer: Medicare HMO | Admitting: Neurology

## 2020-12-31 ENCOUNTER — Telehealth: Payer: Medicare HMO | Admitting: *Deleted

## 2021-01-10 ENCOUNTER — Other Ambulatory Visit: Payer: Self-pay

## 2021-01-10 ENCOUNTER — Ambulatory Visit (HOSPITAL_COMMUNITY)
Admission: RE | Admit: 2021-01-10 | Discharge: 2021-01-10 | Disposition: A | Discharge status: Home / Self Care | Payer: Medicare HMO | Source: Ambulatory Visit | Attending: Cardiovascular Disease | Admitting: Cardiovascular Disease

## 2021-01-10 DIAGNOSIS — Z9889 Other specified postprocedural states: Secondary | ICD-10-CM | POA: Insufficient documentation

## 2021-01-10 DIAGNOSIS — I480 Paroxysmal atrial fibrillation: Secondary | ICD-10-CM | POA: Insufficient documentation

## 2021-01-10 LAB — ECHOCARDIOGRAM COMPLETE
AR max vel: 2.39 cm2
AV Area VTI: 2.06 cm2
AV Area mean vel: 2.2 cm2
AV Mean grad: 3 mmHg
AV Peak grad: 5.4 mmHg
Ao pk vel: 1.16 m/s
Area-P 1/2: 3.66 cm2
Calc EF: 38.5 %
MV VTI: 1.49 cm2
P 1/2 time: 563 msec
S' Lateral: 3.9 cm
Single Plane A2C EF: 36.1 %
Single Plane A4C EF: 40.4 %

## 2021-01-10 NOTE — Progress Notes (Signed)
*  PRELIMINARY RESULTS* Echocardiogram 2D Echocardiogram has been performed.  [name] 01/10/2021, 12:42 PM

## 2021-01-15 ENCOUNTER — Ambulatory Visit (INDEPENDENT_AMBULATORY_CARE_PROVIDER_SITE_OTHER): Payer: Medicare HMO

## 2021-01-15 ENCOUNTER — Other Ambulatory Visit: Payer: Self-pay

## 2021-01-15 DIAGNOSIS — Z23 Encounter for immunization: Secondary | ICD-10-CM | POA: Diagnosis not present

## 2021-01-21 NOTE — Progress Notes (Signed)
Patient ID: [name], female   DOB: 11-Aug-1928, 87 y.o.   MRN: [id number]      87 y.o. f/u post MV repair TTE 01/12/19 showed trace MR with low diastolic gradients and normal EF 60%.  She has HTN on ARB More dizziness on 50 mg Cozaar dose decreased. She thinks statin Makes her feel worse and now taking Qod.  Some LE varicosities   Depression continues. Husband [name] was a patient of mine and died of lung cancer 6 years ago They were married 66 years. Shortly after that [name] died tragically in there back yard of a chain saw accident. Son [name] living with her and two daughters near by.   Has had some memory issues and has been on namenda and aricept  Hospitalized 09/27/20 with COVID causing afib and new DCM with TTE showing EF 30-35% MV repair fine with trivial MR no MS mean gradient 4 mmhg  She converted on amiodarone with SSS and some bradycardia Aricept d/c  Started on eliquis 2.5 mg bid and low dose Toprol 25 mg Low dose ARB added  As well   Doing great since d/c No palpitations, dyspnea or memory issues   TTE 01/10/21 EF improved 35-40% on beta blocker and ARB  Mad that she can't drive anymore  ROS: Denies fever, malais, weight loss, blurry vision, decreased visual acuity, cough, sputum, SOB, hemoptysis, pleuritic pain, palpitaitons, heartburn, abdominal pain, melena, lower extremity edema, claudication, or rash.  All other systems reviewed and negative  BP (!) 162/68   Pulse 65   Ht 5' 3.5" (1.613 m)   Wt 107 lb (48.5 kg)   SpO2 94%   BMI 18.66 kg/m   Affect appropriate Healthy:  appears stated age 16: normal Neck supple with no adenopathy JVP normal no bruits no thyromegaly Lungs clear with no wheezing and good diaphragmatic motion Heart:  S1/S2 no murmur, no rub, gallop or click PMI normal  Post sternotomy  Abdomen: benighn, BS positve, no tenderness, no AAA no bruit.  No HSM or HJR Distal pulses intact with no bruits No edema Neuro non-focal Skin warm  and dry No muscular weakness   Current Outpatient Medications  Medication Sig Dispense Refill   amiodarone (PACERONE) 200 MG tablet Take 1 tablet twice a day for 10 days, then take 1 tablet daily. 60 tablet 0   apixaban (ELIQUIS) 2.5 MG TABS tablet Take 1 tablet (2.5 mg total) by mouth 2 (two) times daily. 60 tablet 3   atorvastatin (LIPITOR) 10 MG tablet Take 1 tablet (10 mg total) by mouth daily. 90 tablet 3   Biotin 1000 MCG tablet Take 1,000 mcg by mouth daily.     Cholecalciferol (VITAMIN D3) 10000 UNITS capsule Take 10,000 Units by mouth daily.       losartan (COZAAR) 50 MG tablet Take 25 mg by mouth daily.     memantine (NAMENDA) 10 MG tablet Take 1 tablet (10 mg total) by mouth 2 (two) times daily. Start this only after finishing titration pack 60 tablet 3   metoprolol succinate (TOPROL-XL) 25 MG 24 hr tablet Take 1 tablet (25 mg total) by mouth daily. 30 tablet 3   No current facility-administered medications for this visit.    Allergies  Actonel [risedronate sodium], Aspirin-caffeine, Codeine, Penicillins, and Sulfonamide derivatives  Electrocardiogram:   12/02/17  SR rate 62 LAD RBBB 01/28/2021 SR rate 58 RBBB/LAD    Assessment and Plan  MV Repair:  SBE prophylaxis discussed  Echo 01/10/21 repair  intact no significant MR   HTN: Well controlled.  Continue current medications and low sodium Dash type diet.    Thyroid:  On replacement TSH 3.14 September 2019 normal   PAF: IN NSR decrease amiodarone to 200 mg daily continue low dose eliquis   DCM:  09/27/20 EF 30-35% in setting of afib/Covid. TTE done 01/10/21 improved to 35-40% Continue beta blocker and ARB No volume overload   Memory: Aricept d/c in hospital due to bradycardia   F/U in 3 months   [name]

## 2021-01-23 DIAGNOSIS — Z85828 Personal history of other malignant neoplasm of skin: Secondary | ICD-10-CM | POA: Diagnosis not present

## 2021-01-23 DIAGNOSIS — L300 Nummular dermatitis: Secondary | ICD-10-CM | POA: Diagnosis not present

## 2021-01-23 DIAGNOSIS — C4441 Basal cell carcinoma of skin of scalp and neck: Secondary | ICD-10-CM | POA: Diagnosis not present

## 2021-01-23 DIAGNOSIS — L570 Actinic keratosis: Secondary | ICD-10-CM | POA: Diagnosis not present

## 2021-01-23 DIAGNOSIS — Z08 Encounter for follow-up examination after completed treatment for malignant neoplasm: Secondary | ICD-10-CM | POA: Diagnosis not present

## 2021-01-23 DIAGNOSIS — D485 Neoplasm of uncertain behavior of skin: Secondary | ICD-10-CM | POA: Diagnosis not present

## 2021-01-24 ENCOUNTER — Telehealth: Payer: Self-pay | Admitting: Family Medicine

## 2021-01-24 MED ORDER — AMIODARONE HCL 200 MG PO TABS: ORAL_TABLET | ORAL | 0 refills | Status: DC

## 2021-01-24 NOTE — Telephone Encounter (Signed)
Pt has been out of this rx for two days. Can it be called in?

## 2021-01-24 NOTE — Telephone Encounter (Signed)
  Prescription Request  01/24/2021  What is the name of the medication or equipment? AMIODARONE 200 MG  Have you contacted your pharmacy to request a refill? YES  Which pharmacy would you like this sent to? [facility], [place]  Pt was prescribed this medication when she was in the hospital, to keep her heart in rhythm. Pt saw Marjorie Smolder on 10/31/20 for hospital follow up and had refill at that time but is now out and needs refills sent to pharmacy.

## 2021-01-24 NOTE — Telephone Encounter (Signed)
Med sent to pharmacy, patient aware

## 2021-01-28 ENCOUNTER — Encounter: Payer: Self-pay | Admitting: Cardiovascular Disease

## 2021-01-28 ENCOUNTER — Other Ambulatory Visit: Payer: Self-pay

## 2021-01-28 ENCOUNTER — Telehealth: Payer: Self-pay | Admitting: Neurology

## 2021-01-28 ENCOUNTER — Ambulatory Visit: Payer: Medicare HMO | Admitting: Cardiovascular Disease

## 2021-01-28 VITALS — BP 162/68 | HR 65 | Ht 63.5 in | Wt 107.0 lb

## 2021-01-28 DIAGNOSIS — I420 Dilated cardiomyopathy: Secondary | ICD-10-CM

## 2021-01-28 DIAGNOSIS — I480 Paroxysmal atrial fibrillation: Secondary | ICD-10-CM | POA: Diagnosis not present

## 2021-01-28 DIAGNOSIS — Z9889 Other specified postprocedural states: Secondary | ICD-10-CM | POA: Diagnosis not present

## 2021-01-28 MED ORDER — APIXABAN 2.5 MG PO TABS: 2.5000 mg | ORAL_TABLET | Freq: Two times a day (BID) | ORAL | 3 refills | Status: DC

## 2021-01-28 NOTE — Patient Instructions (Signed)
Medication Instructions:  The current medical regimen is effective;  continue present plan and medications.  *If you need a refill on your cardiac medications before your next appointment, please call your pharmacy*  Follow-Up: At [facility], you and your health needs are our priority.  As part of our continuing mission to provide you with exceptional heart care, we have created designated Provider Care Teams.  These Care Teams include your primary Cardiologist (physician) and Advanced Practice Providers (APPs -  Physician Assistants and Nurse Practitioners) who all work together to provide you with the care you need, when you need it.  We recommend signing up for the patient portal called "MyChart".  Sign up information is provided on this After Visit Summary.  MyChart is used to connect with patients for Virtual Visits (Telemedicine).  Patients are able to view lab/test results, encounter notes, upcoming appointments, etc.  Non-urgent messages can be sent to your provider as well.   To learn more about what you can do with MyChart, go to [email or web address].    Your next appointment:   6 month(s)  The format for your next appointment:   In Person  Provider:   [name], MD

## 2021-01-28 NOTE — Telephone Encounter (Signed)
I called the [facility]. The pharmacy was trying to fill the starter pack that was originally sent rather than her maintenance dose of memantine10mg , one tab BID. They will get the prescription ready for pick up. I left the patient's dgt a message with this information (ok per DPR).

## 2021-01-28 NOTE — Telephone Encounter (Signed)
Pt's daughter has called to report that the pharmacy is out of stock of memantine (NAMENDA) 10 MG tablet.  Pt's daughtestates the pharmacy is able to do 5 mg but its the 10 mg that is on back order.  Please call

## 2021-02-04 ENCOUNTER — Encounter: Payer: Self-pay | Admitting: Family Medicine

## 2021-02-04 ENCOUNTER — Ambulatory Visit (INDEPENDENT_AMBULATORY_CARE_PROVIDER_SITE_OTHER): Payer: Medicare HMO | Admitting: Family Medicine

## 2021-02-04 VITALS — BP 138/62 | HR 71 | Temp 97.4°F | Ht 63.5 in | Wt 107.2 lb

## 2021-02-04 DIAGNOSIS — I480 Paroxysmal atrial fibrillation: Secondary | ICD-10-CM | POA: Diagnosis not present

## 2021-02-04 DIAGNOSIS — J40 Bronchitis, not specified as acute or chronic: Secondary | ICD-10-CM | POA: Diagnosis not present

## 2021-02-04 DIAGNOSIS — J329 Chronic sinusitis, unspecified: Secondary | ICD-10-CM

## 2021-02-04 DIAGNOSIS — I10 Essential (primary) hypertension: Secondary | ICD-10-CM | POA: Diagnosis not present

## 2021-02-04 DIAGNOSIS — E782 Mixed hyperlipidemia: Secondary | ICD-10-CM

## 2021-02-04 DIAGNOSIS — G3184 Mild cognitive impairment, so stated: Secondary | ICD-10-CM | POA: Diagnosis not present

## 2021-02-04 MED ORDER — AMIODARONE HCL 200 MG PO TABS: ORAL_TABLET | ORAL | 3 refills | Status: DC

## 2021-02-04 MED ORDER — BENZONATATE 200 MG PO CAPS: 200.0000 mg | ORAL_CAPSULE | Freq: Three times a day (TID) | ORAL | 0 refills | Status: DC | PRN

## 2021-02-04 MED ORDER — DONEPEZIL HCL 5 MG PO TABS: 5.0000 mg | ORAL_TABLET | Freq: Every day | ORAL | 1 refills | Status: DC

## 2021-02-04 MED ORDER — AZITHROMYCIN 250 MG PO TABS: ORAL_TABLET | ORAL | 0 refills | Status: DC

## 2021-02-04 NOTE — Progress Notes (Signed)
Subjective:  Patient ID: [name], female    DOB: [birth year]  Age: 87 y.o. MRN: [id number]  CC: Medical Management of Chronic Issues   HPI [name] presents for agitation brought on by memantine. Better after DC of the medicine. Gradually improving. Had ben taken off of donepezil. Not sure why. It seemed to be well tolerated at the time.   Patient presents with upper respiratory congestion. Rhinorrhea that is frequently purulent. There is moderate sore throat. Patient reports coughing frequently as well.  no sputum noted. There is no fever, chills or sweats. The patient  reports being mildly short of breath. Onset was 3-5 days ago. Gradually worsening. Tried OTCs without improvement.   presents for  follow-up of hypertension. Patient has no history of headache chest pain or shortness of breath or recent cough. Patient also denies symptoms of TIA such as focal numbness or weakness. Patient denies side effects from medication. States taking it regularly.  in for follow-up of elevated cholesterol. Doing well without complaints on current medication. Denies side effects of statin including myalgia and arthralgia and nausea. Currently no chest pain, shortness of breath or other cardiovascular related symptoms noted.  Atrial fibrillation follow up. Pt. is treated with rate control and anticoagulation. Pt.  denies palpitations, rapid rate, chest pain, dyspnea and edema. There has been no bleeding from nose or gums. Pt. has not noticed blood with urine or stool.  Although there is routine bruising easily, it is not excessive.   Depression screen [facility] 2/9 02/04/2021 02/04/2021 10/31/2020  Decreased Interest 0 0 0  Down, Depressed, Hopeless 0 0 0  PHQ - 2 Score 0 0 0  Altered sleeping 0 - 0  Tired, decreased energy 1 - 0  Change in appetite 1 - 0  Feeling bad or failure about yourself  0 - 0  Trouble concentrating 0 - 0  Moving slowly or fidgety/restless 2 - 0  Suicidal thoughts 0 - 0   PHQ-9 Score 4 - 0  Difficult doing work/chores Not difficult at all - Not difficult at all    History [name] has a past medical history of Arthritis, Atrial fibrillation ([place]), COVID-19, CVA (cerebral vascular accident) ([place]), HTN (hypertension), MR (mitral regurgitation), NSTEMI (non-ST elevated myocardial infarction) ([place]), and Pericarditis.   She has a past surgical history that includes anterior repair with perigee graft (04/16/06); Anterior and posterior vaginal repair (10/15/04); sacral spinous ligament suspension of vagina; suprapubic cystectomy; Total abdominal hysterectomy w/ bilateral salpingoophorectomy; and Mitral valve replacement.   Her family history includes Colon cancer in her brother; Heart attack in her father; Heart disease in her brother, father, and mother; Lupus in her father.She reports that she has never smoked. She has never used smokeless tobacco. She reports that she does not drink alcohol and does not use drugs.    ROS Review of Systems  Constitutional: Negative.   HENT: Negative.    Eyes:  Negative for visual disturbance.  Respiratory:  Positive for cough (deep  cough for a week. Still congested.). Negative for shortness of breath.   Cardiovascular:  Positive for leg swelling. Negative for chest pain.  Gastrointestinal:  Negative for abdominal pain.  Musculoskeletal:  Negative for arthralgias.   Objective:  BP 138/62   Pulse 71   Temp (!) 97.4 F (36.3 C)   Ht 5' 3.5" (1.613 m)   Wt 107 lb 3.2 oz (48.6 kg)   SpO2 97%   BMI 18.69 kg/m   BP Readings from  Last 3 Encounters:  02/04/21 138/62  01/28/21 (!) 162/68  10/31/20 (!) 144/88    Wt Readings from Last 3 Encounters:  02/04/21 107 lb 3.2 oz (48.6 kg)  01/28/21 107 lb (48.5 kg)  10/31/20 101 lb (45.8 kg)     Physical Exam Constitutional:      General: She is not in acute distress.    Appearance: She is well-developed.  Cardiovascular:     Rate and Rhythm: Normal rate and regular rhythm.   Pulmonary:     Breath sounds: Normal breath sounds.  Musculoskeletal:        General: Normal range of motion.  Skin:    General: Skin is warm and dry.  Neurological:     Mental Status: She is alert and oriented to person, place, and time.      Assessment & Plan:   [name] was seen today for medical management of chronic issues.  Diagnoses and all orders for this visit:  Essential hypertension -     CBC with Differential/Platelet -     CMP14+EGFR  Mixed hyperlipidemia -     Lipid panel  Paroxysmal atrial fibrillation (HCC)  Mild cognitive impairment  Sinobronchitis  Other orders -     amiodarone (PACERONE) 200 MG tablet; Take 1 tablet twice a day for 10 days, then take 1 tablet daily. -     donepezil (ARICEPT) 5 MG tablet; Take 1 tablet (5 mg total) by mouth at bedtime. -     azithromycin (ZITHROMAX Z-PAK) 250 MG tablet; Take two right away Then one a day for the next 4 days. -     benzonatate (TESSALON) 200 MG capsule; Take 1 capsule (200 mg total) by mouth 3 (three) times daily as needed for cough.      I have discontinued [name] "[name]"'s memantine. I am also having her start on donepezil, azithromycin, and benzonatate. Additionally, I am having her maintain her Vitamin D3, Biotin, atorvastatin, losartan, metoprolol succinate, apixaban, and amiodarone.  Allergies as of 02/04/2021       Reactions   Actonel [risedronate Sodium] Nausea And Vomiting   Aspirin-caffeine    Upset stomach   Codeine    REACTION: upset stomach   Penicillins    Sulfonamide Derivatives    REACTION: swollen tongue        Medication List        Accurate as of February 04, 2021  2:56 PM. If you have any questions, ask your nurse or doctor.          STOP taking these medications    memantine 10 MG tablet Commonly known as: Namenda Stopped by: Sharon Fraise, MD       TAKE these medications    amiodarone 200 MG tablet Commonly known as: PACERONE Take 1 tablet  twice a day for 10 days, then take 1 tablet daily.   apixaban 2.5 MG Tabs tablet Commonly known as: ELIQUIS Take 1 tablet (2.5 mg total) by mouth 2 (two) times daily.   atorvastatin 10 MG tablet Commonly known as: LIPITOR Take 1 tablet (10 mg total) by mouth daily.   azithromycin 250 MG tablet Commonly known as: Zithromax Z-Pak Take two right away Then one a day for the next 4 days. Started by: Sharon Fraise, MD   benzonatate 200 MG capsule Commonly known as: TESSALON Take 1 capsule (200 mg total) by mouth 3 (three) times daily as needed for cough. Started by: Sharon Fraise, MD   Biotin 1000 MCG tablet  Take 1,000 mcg by mouth daily.   donepezil 5 MG tablet Commonly known as: ARICEPT Take 1 tablet (5 mg total) by mouth at bedtime. Started by: Sharon Fraise, MD   losartan 50 MG tablet Commonly known as: COZAAR Take 25 mg by mouth daily.   metoprolol succinate 25 MG 24 hr tablet Commonly known as: TOPROL-XL Take 1 tablet (25 mg total) by mouth daily.   Vitamin D3 250 MCG (10000 UT) capsule Take 10,000 Units by mouth daily.         Follow-up: Return in about 3 months (around 05/07/2021), or if symptoms worsen or fail to improve.  [name], M.D.

## 2021-02-05 ENCOUNTER — Telehealth: Payer: Self-pay | Admitting: Family Medicine

## 2021-02-05 LAB — CBC WITH DIFFERENTIAL/PLATELET
Basophils Absolute: 0 x10E3/uL (ref 0.0–0.2)
Basos: 1 %
EOS (ABSOLUTE): 0.2 x10E3/uL (ref 0.0–0.4)
Eos: 3 %
Hematocrit: 36.5 % (ref 34.0–46.6)
Hemoglobin: 12.1 g/dL (ref 11.1–15.9)
Immature Grans (Abs): 0 x10E3/uL (ref 0.0–0.1)
Immature Granulocytes: 1 %
Lymphocytes Absolute: 1.1 x10E3/uL (ref 0.7–3.1)
Lymphs: 12 %
MCH: 29.7 pg (ref 26.6–33.0)
MCHC: 33.2 g/dL (ref 31.5–35.7)
MCV: 90 fL (ref 79–97)
Monocytes Absolute: 0.7 x10E3/uL (ref 0.1–0.9)
Monocytes: 9 %
Neutrophils Absolute: 6.5 x10E3/uL (ref 1.4–7.0)
Neutrophils: 74 %
Platelets: 250 x10E3/uL (ref 150–450)
RBC: 4.08 x10E6/uL (ref 3.77–5.28)
RDW: 12.1 % (ref 11.7–15.4)
WBC: 8.6 x10E3/uL (ref 3.4–10.8)

## 2021-02-05 LAB — CMP14+EGFR
ALT: 25 IU/L (ref 0–32)
AST: 31 IU/L (ref 0–40)
Albumin/Globulin Ratio: 1.3 (ref 1.2–2.2)
Albumin: 3.7 g/dL (ref 3.5–4.6)
Alkaline Phosphatase: 93 IU/L (ref 44–121)
BUN/Creatinine Ratio: 17 (ref 12–28)
BUN: 18 mg/dL (ref 10–36)
Bilirubin Total: 0.6 mg/dL (ref 0.0–1.2)
CO2: 22 mmol/L (ref 20–29)
Calcium: 8.3 mg/dL — ABNORMAL LOW (ref 8.7–10.3)
Chloride: 101 mmol/L (ref 96–106)
Creatinine, Ser: 1.09 mg/dL — ABNORMAL HIGH (ref 0.57–1.00)
Globulin, Total: 2.9 g/dL (ref 1.5–4.5)
Glucose: 112 mg/dL — ABNORMAL HIGH (ref 70–99)
Potassium: 3.8 mmol/L (ref 3.5–5.2)
Sodium: 143 mmol/L (ref 134–144)
Total Protein: 6.6 g/dL (ref 6.0–8.5)
eGFR: 48 mL/min/{1.73_m2} — ABNORMAL LOW (ref >59)

## 2021-02-05 LAB — LIPID PANEL
Chol/HDL Ratio: 2.5 ratio (ref 0.0–4.4)
Cholesterol, Total: 128 mg/dL (ref 100–199)
HDL: 51 mg/dL
LDL Chol Calc (NIH): 64 mg/dL (ref 0–99)
Triglycerides: 64 mg/dL (ref 0–149)
VLDL Cholesterol Cal: 13 mg/dL (ref 5–40)

## 2021-02-05 NOTE — Telephone Encounter (Signed)
Daughter is calling for pt and says that new rx donepezil (ARICEPT) 5 MG tablet is causes pt to not feel good. Pt was saying weird things. Daughter wants to talk to nurse or [facility] about this. Please call back

## 2021-02-05 NOTE — Progress Notes (Signed)
Hello Arionna,  Your lab result is normal and/or stable.Some minor variations that are not significant are commonly marked abnormal, but do not represent any medical problem for you.  Best regards, Lataisha Colan, M.D.

## 2021-02-06 NOTE — Telephone Encounter (Signed)
Daughter aware.

## 2021-02-06 NOTE — Telephone Encounter (Signed)
DC the aricept/donepezil

## 2021-02-08 ENCOUNTER — Encounter: Payer: Self-pay | Admitting: Family Medicine

## 2021-02-08 ENCOUNTER — Ambulatory Visit (INDEPENDENT_AMBULATORY_CARE_PROVIDER_SITE_OTHER): Payer: Medicare HMO | Admitting: Family Medicine

## 2021-02-08 VITALS — BP 172/83 | HR 73 | Temp 97.8°F | Ht 63.5 in | Wt 106.2 lb

## 2021-02-08 DIAGNOSIS — J019 Acute sinusitis, unspecified: Secondary | ICD-10-CM

## 2021-02-08 MED ORDER — AMOXICILLIN 875 MG PO TABS: 875.0000 mg | ORAL_TABLET | Freq: Two times a day (BID) | ORAL | 0 refills | Status: AC

## 2021-02-08 MED ORDER — CETIRIZINE HCL 5 MG PO TABS: 5.0000 mg | ORAL_TABLET | Freq: Every day | ORAL | 1 refills | Status: DC

## 2021-02-08 NOTE — Progress Notes (Signed)
Acute Office Visit  Subjective:    Patient ID: [name], female    DOB: May 08, 1928, 87 y.o.   MRN: [id number]  Chief Complaint  Patient presents with   Cough    HPI Here with daughter. Patient is in today for cough and congestion x 2 weeks. She also feels fatigued and has had a decreased appetite. She denies headaches, body aches, chills, fever, shortness of breath, or chest pain. She finished a zpak yesterday. She did feel like this helped, but has not resolved her symptoms and her symptoms seemed to have worsened since yesterday. She denies urinary symptoms, vomiting, diarrhea, or abdominal pain. She has also taken tessalon perles without improvement.   Past Medical History:  Diagnosis Date   Arthritis    Atrial fibrillation (HCC)    postoperative   COVID-19    CVA (cerebral vascular accident) (HCC)    HTN (hypertension)    MR (mitral regurgitation)    severe   NSTEMI (non-ST elevated myocardial infarction) (HCC)    Pericarditis    postoperative    Past Surgical History:  Procedure Laterality Date   ANTERIOR AND POSTERIOR VAGINAL REPAIR  10/15/04   Dr. Jennette Kettle    anterior repair with perigee graft  04/16/06   posterior repair with apogee graft; lynx mid urethral sling; sacrospinous ligamnet suspension and cystoscopy; Dr. Lovina Reach   MITRAL VALVE REPLACEMENT     sacral spinous ligament suspension of vagina     suprapubic cystectomy     TOTAL ABDOMINAL HYSTERECTOMY W/ BILATERAL SALPINGOOPHORECTOMY      Family History  Problem Relation Age of Onset   Heart disease Father    Lupus Father    Heart attack Father    Heart disease Mother    Colon cancer Brother    Heart disease Brother     Social History   Socioeconomic History   Marital status: Widowed    Spouse name: Not on file   Number of children: 3   Years of education: Not on file   Highest education level: 9th grade  Occupational History   Occupation: Retired  Tobacco Use   Smoking status: Never    Smokeless tobacco: Never  Vaping Use   Vaping Use: Never used  Substance and Sexual Activity   Alcohol use: No   Drug use: No   Sexual activity: Not on file  Other Topics Concern   Not on file  Social History Narrative   Lives with son   Right Handed    Drinks 2-3  cups caffeine daily   Social Determinants of Health   Financial Resource Strain: Low Risk    Difficulty of Paying Living Expenses: Not hard at all  Food Insecurity: No Food Insecurity   Worried About Programme researcher, broadcasting/film/video in the Last Year: Never true   Barista in the Last Year: Never true  Transportation Needs: No Transportation Needs   Lack of Transportation (Medical): No   Lack of Transportation (Non-Medical): No  Physical Activity: Not on file  Stress: Not on file  Social Connections: Moderately Integrated   Frequency of Communication with Friends and Family: More than three times a week   Frequency of Social Gatherings with Friends and Family: More than three times a week   Attends Religious Services: More than 4 times per year   Active Member of [employer] or Organizations: Yes   Attends Banker Meetings: More than 4 times per year   Marital Status:  Widowed  Intimate Partner Violence: Not on file    Outpatient Medications Prior to Visit  Medication Sig Dispense Refill   amiodarone (PACERONE) 200 MG tablet Take 1 tablet twice a day for 10 days, then take 1 tablet daily. 180 tablet 3   apixaban (ELIQUIS) 2.5 MG TABS tablet Take 1 tablet (2.5 mg total) by mouth 2 (two) times daily. 180 tablet 3   atorvastatin (LIPITOR) 10 MG tablet Take 1 tablet (10 mg total) by mouth daily. 90 tablet 3   benzonatate (TESSALON) 200 MG capsule Take 1 capsule (200 mg total) by mouth 3 (three) times daily as needed for cough. 20 capsule 0   Biotin 1000 MCG tablet Take 1,000 mcg by mouth daily.     Cholecalciferol (VITAMIN D3) 10000 UNITS capsule Take 10,000 Units by mouth daily.       losartan (COZAAR) 50 MG tablet  Take 25 mg by mouth daily.     metoprolol succinate (TOPROL-XL) 25 MG 24 hr tablet Take 1 tablet (25 mg total) by mouth daily. 30 tablet 3   azithromycin (ZITHROMAX Z-PAK) 250 MG tablet Take two right away Then one a day for the next 4 days. 6 each 0   donepezil (ARICEPT) 5 MG tablet Take 1 tablet (5 mg total) by mouth at bedtime. 90 tablet 1   No facility-administered medications prior to visit.    Allergies  Allergen Reactions   Actonel [Risedronate Sodium] Nausea And Vomiting   Aspirin-Caffeine     Upset stomach    Codeine     REACTION: upset stomach   Penicillins    Sulfonamide Derivatives     REACTION: swollen tongue    Review of Systems As per HPI.    Objective:    Physical Exam Vitals and nursing note reviewed.  Constitutional:      General: She is not in acute distress.    Appearance: She is not ill-appearing, toxic-appearing or diaphoretic.  HENT:     Right Ear: Tympanic membrane, ear canal and external ear normal.     Left Ear: Tympanic membrane, ear canal and external ear normal.     Nose: Congestion present.     Right Sinus: Maxillary sinus tenderness and frontal sinus tenderness present.     Left Sinus: Maxillary sinus tenderness and frontal sinus tenderness present.     Mouth/Throat:     Mouth: Mucous membranes are moist.     Pharynx: Oropharynx is clear.  Eyes:     Conjunctiva/sclera: Conjunctivae normal.     Pupils: Pupils are equal, round, and reactive to light.  Cardiovascular:     Rate and Rhythm: Normal rate and regular rhythm.     Heart sounds: Normal heart sounds.  Pulmonary:     Effort: Pulmonary effort is normal. No respiratory distress.     Breath sounds: Normal breath sounds.  Abdominal:     General: Abdomen is flat. There is no distension.     Palpations: Abdomen is soft.     Tenderness: There is no abdominal tenderness.  Skin:    General: Skin is warm and dry.  Neurological:     Mental Status: She is alert and oriented to person,  place, and time. Mental status is at baseline.  Psychiatric:        Behavior: Behavior normal.    BP (!) 172/83   Pulse 73   Temp 97.8 F (36.6 C) (Temporal)   Ht 5' 3.5" (1.613 m)   Wt 106 lb 4 oz (48.2  kg)   SpO2 96%   BMI 18.53 kg/m  Wt Readings from Last 3 Encounters:  02/08/21 106 lb 4 oz (48.2 kg)  02/04/21 107 lb 3.2 oz (48.6 kg)  01/28/21 107 lb (48.5 kg)    Health Maintenance Due  Topic Date Due   COVID-19 Vaccine (4 - Booster for Moderna series) 05/04/2020    There are no preventive care reminders to display for this patient.   Lab Results  Component Value Date   TSH 3.945 09/27/2020   Lab Results  Component Value Date   WBC 8.6 02/04/2021   HGB 12.1 02/04/2021   HCT 36.5 02/04/2021   MCV 90 02/04/2021   PLT 250 02/04/2021   Lab Results  Component Value Date   NA 143 02/04/2021   K 3.8 02/04/2021   CO2 22 02/04/2021   GLUCOSE 112 (H) 02/04/2021   BUN 18 02/04/2021   CREATININE 1.09 (H) 02/04/2021   BILITOT 0.6 02/04/2021   ALKPHOS 93 02/04/2021   AST 31 02/04/2021   ALT 25 02/04/2021   PROT 6.6 02/04/2021   ALBUMIN 3.7 02/04/2021   CALCIUM 8.3 (L) 02/04/2021   ANIONGAP 5 10/03/2020   EGFR 48 (L) 02/04/2021   GFR 62.20 11/06/2011   Lab Results  Component Value Date   CHOL 128 02/04/2021   Lab Results  Component Value Date   HDL 51 02/04/2021   Lab Results  Component Value Date   LDLCALC 64 02/04/2021   Lab Results  Component Value Date   TRIG 64 02/04/2021   Lab Results  Component Value Date   CHOLHDL 2.5 02/04/2021   No results found for: HGBA1C     Assessment & Plan:   Special was seen today for cough.  Diagnoses and all orders for this visit:  Subacute sinusitis, unspecified location Amoxicillin ordered. Start zyrtec daily. Discussed symptomatic care and return precautions.  -     cetirizine (ZYRTEC) 5 MG tablet; Take 1 tablet (5 mg total) by mouth daily. -     amoxicillin (AMOXIL) 875 MG tablet; Take 1 tablet (875 mg  total) by mouth 2 (two) times daily for 7 days.  The patient indicates understanding of these issues and agrees with the plan.   Gwenlyn Perking, FNP

## 2021-02-08 NOTE — Patient Instructions (Signed)

## 2021-02-11 ENCOUNTER — Encounter: Payer: Self-pay | Admitting: Family Medicine

## 2021-02-11 ENCOUNTER — Telehealth: Payer: Self-pay | Admitting: *Deleted

## 2021-02-11 NOTE — Telephone Encounter (Signed)
-----   Message from Josue Hector, MD sent at 02/11/2021 12:41 PM EST ----- Can you put her on my DOD schedule tomorrow for dyspnea

## 2021-02-11 NOTE — Telephone Encounter (Signed)
Pt is scheduled to see Dr. Johnsie Cancel on his DOD day tomorrow 12/6 at 3:30 pm.  Pt made aware of appt date and time by Scheduling dept.  Appt is for dyspnea.

## 2021-02-11 NOTE — Progress Notes (Signed)
Patient ID: [name], female   DOB: 1928-03-16, 87 y.o.   MRN: [id number]      87 y.o. f/u post MV repair TTE 01/12/19 showed trace MR with low diastolic gradients and normal EF 60%.  She has HTN on ARB More dizziness on 50 mg Cozaar dose decreased. She thinks statin Makes her feel worse and now taking Qod.  Some LE varicosities   Depression continues. Husband [name] was a patient of mine and died of lung cancer 6 years ago They were married 43 years. Shortly after that [name] died tragically in there back yard of a chain saw accident. Son [name] living with her and two daughters near by.   Has had some memory issues and has been on namenda and aricept  Hospitalized 09/27/20 with COVID causing afib and new DCM with TTE showing EF 30-35% MV repair fine with trivial MR no MS mean gradient 4 mmhg  Converted with amiodarone to slow sinus and aricept d/c Added Toprol, ARB and eliquis 2.5 bid  TTE 01/10/21 EF improved 35-40%   Last week or so has had more dyspnea Primary ? Sinusitis and started her on Augmentin Cough with sputum rhiorrhea and sore throat with cough  As far as I can tell the biggest issues are URI/cough now post Rx with Zithromax and Augmentin and MS changes from being sick and side effects from Namenda and Aricept  ROS: Denies fever, malais, weight loss, blurry vision, decreased visual acuity, cough, sputum, SOB, hemoptysis, pleuritic pain, palpitaitons, heartburn, abdominal pain, melena, lower extremity edema, claudication, or rash.  All other systems reviewed and negative  BP 130/62   Pulse 79   Ht 5' 3.5" (1.613 m)   Wt 106 lb (48.1 kg)   SpO2 95%   BMI 18.48 kg/m   Affect appropriate Healthy:  appears stated age 25: normal Neck supple with no adenopathy JVP normal no bruits no thyromegaly Lungs clear with no wheezing and good diaphragmatic motion Heart:  S1/S2 no murmur, no rub, gallop or click PMI normal  Post sternotomy  Abdomen: benighn, BS positve, no  tenderness, no AAA no bruit.  No HSM or HJR Distal pulses intact with no bruits No edema Neuro non-focal Skin warm and dry No muscular weakness   Current Outpatient Medications  Medication Sig Dispense Refill   amiodarone (PACERONE) 200 MG tablet Take 1 tablet twice a day for 10 days, then take 1 tablet daily. 180 tablet 3   amoxicillin (AMOXIL) 875 MG tablet Take 1 tablet (875 mg total) by mouth 2 (two) times daily for 7 days. 14 tablet 0   apixaban (ELIQUIS) 2.5 MG TABS tablet Take 1 tablet (2.5 mg total) by mouth 2 (two) times daily. 180 tablet 3   atorvastatin (LIPITOR) 10 MG tablet Take 1 tablet (10 mg total) by mouth daily. 90 tablet 3   benzonatate (TESSALON) 200 MG capsule Take 1 capsule (200 mg total) by mouth 3 (three) times daily as needed for cough. 20 capsule 0   Biotin 1000 MCG tablet Take 1,000 mcg by mouth daily.     cetirizine (ZYRTEC) 5 MG tablet Take 1 tablet (5 mg total) by mouth daily. 30 tablet 1   Cholecalciferol (VITAMIN D3) 10000 UNITS capsule Take 10,000 Units by mouth daily.       losartan (COZAAR) 50 MG tablet Take 25 mg by mouth daily.     metoprolol succinate (TOPROL-XL) 25 MG 24 hr tablet Take 1 tablet (25 mg total) by mouth daily.  30 tablet 3   No current facility-administered medications for this visit.    Allergies  Donepezil, Actonel [risedronate sodium], Aspirin-caffeine, Codeine, Penicillins, and Sulfonamide derivatives  Electrocardiogram:   12/02/17  SR rate 62 LAD RBBB 02/12/2021 SR rate 58 RBBB/LAD    Assessment and Plan  MV Repair:  SBE prophylaxis discussed  Echo 01/10/21 repair intact no significant MR   HTN: Well controlled.  Continue current medications and low sodium Dash type diet.    Thyroid:  On replacement TSH 3.14 September 2019 normal   PAF: IN NSR decrease amiodarone to 200 mg daily continue low dose eliquis   DCM:  09/27/20 EF 30-35% in setting of afib/Covid. TTE done 01/10/21 improved to 35-40% Continue beta blocker and ARB She is  not volume overloaded Check CXR given persistent brochitis   Memory: Aricept d/c in hospital due to bradycardia Sent Dr Livia Snellen a message never to restart and she appears to be having side effects from [place] Consider testing for flue/RSV per primary  Consider course of steroids for malaise and cough   F/U in 3 months   [name]

## 2021-02-12 ENCOUNTER — Encounter: Payer: Self-pay | Admitting: Cardiovascular Disease

## 2021-02-12 ENCOUNTER — Other Ambulatory Visit: Payer: Self-pay

## 2021-02-12 ENCOUNTER — Ambulatory Visit: Payer: Medicare HMO | Admitting: Cardiovascular Disease

## 2021-02-12 ENCOUNTER — Ambulatory Visit
Admission: RE | Admit: 2021-02-12 | Discharge: 2021-02-12 | Disposition: A | Discharge status: Home / Self Care | Payer: Medicare HMO | Source: Ambulatory Visit | Attending: Cardiovascular Disease | Admitting: Cardiovascular Disease

## 2021-02-12 VITALS — BP 130/62 | HR 79 | Ht 63.5 in | Wt 106.0 lb

## 2021-02-12 DIAGNOSIS — I480 Paroxysmal atrial fibrillation: Secondary | ICD-10-CM | POA: Diagnosis not present

## 2021-02-12 DIAGNOSIS — J40 Bronchitis, not specified as acute or chronic: Secondary | ICD-10-CM

## 2021-02-12 DIAGNOSIS — R0609 Other forms of dyspnea: Secondary | ICD-10-CM

## 2021-02-12 DIAGNOSIS — F05 Delirium due to known physiological condition: Secondary | ICD-10-CM | POA: Diagnosis not present

## 2021-02-12 DIAGNOSIS — R059 Cough, unspecified: Secondary | ICD-10-CM | POA: Diagnosis not present

## 2021-02-12 DIAGNOSIS — Z9889 Other specified postprocedural states: Secondary | ICD-10-CM | POA: Diagnosis not present

## 2021-02-12 DIAGNOSIS — I420 Dilated cardiomyopathy: Secondary | ICD-10-CM | POA: Diagnosis not present

## 2021-02-12 NOTE — Patient Instructions (Signed)
Medication Instructions:  Your physician recommends that you continue on your current medications as directed. Please refer to the Current Medication list given to you today.  *If you need a refill on your cardiac medications before your next appointment, please call your pharmacy*   Lab Work: NONE If you have labs (blood work) drawn today and your tests are completely normal, you will receive your results only by: [place] (if you have MyChart) OR A paper copy in the mail If you have any lab test that is abnormal or we need to change your treatment, we will call you to review the results.   Testing/Procedures: Your physician has recommended that you have a Chest X-ray   Chest X-ray Instructions:    1. You may have this done at the [facility], located in the [place] on the 1st floor.    2. You do no have to have an appointment.    3. [place], [place] [id number]        [phone]        Monday - Friday  8:00 am - 5:00 pm    Follow-Up: At [facility], you and your health needs are our priority.  As part of our continuing mission to provide you with exceptional heart care, we have created designated Provider Care Teams.  These Care Teams include your primary Cardiologist (physician) and Advanced Practice Providers (APPs -  Physician Assistants and Nurse Practitioners) who all work together to provide you with the care you need, when you need it.     Your next appointment:   6 month(s)  The format for your next appointment:   In Person  Provider:   [name], MD

## 2021-02-13 ENCOUNTER — Telehealth: Payer: Self-pay

## 2021-02-13 ENCOUNTER — Ambulatory Visit: Payer: Medicare HMO | Admitting: Cardiovascular Disease

## 2021-02-13 DIAGNOSIS — R9389 Abnormal findings on diagnostic imaging of other specified body structures: Secondary | ICD-10-CM

## 2021-02-13 DIAGNOSIS — J189 Pneumonia, unspecified organism: Secondary | ICD-10-CM

## 2021-02-13 NOTE — Telephone Encounter (Signed)
-----   Message from Josue Hector, MD sent at 02/13/2021 11:15 AM EST ----- Her  CXR is markedly abnormal and she need further w/u for her pneumonia has already been on Zithromax and Augmentin See if we can get her in to see pulmonary this week Forwarded to her primary as well  ----- Message ----- From: Interface, Rad Results In Sent: 02/13/2021  10:25 AM EST To: Josue Hector, MD

## 2021-02-13 NOTE — Telephone Encounter (Signed)
Called patient with result note from Dr. Johnsie Cancel. Ordered referral for pulmonology. Patient requested for [place] to call her daughter. Left message for patient's daughter to call back.

## 2021-02-14 ENCOUNTER — Telehealth: Payer: Self-pay | Admitting: Family Medicine

## 2021-02-14 NOTE — Telephone Encounter (Signed)
Spoke with daughter as requested by pt regarding recent CXR result and referral to pulmonology for treatment of pneumonia.  Urgent referral placed for [facility].  She is aware she will be contacted to schedule.  Daughter grateful for the call back and information.

## 2021-02-14 NOTE — Telephone Encounter (Signed)
Appointment scheduled for tomorrow.

## 2021-02-14 NOTE — Telephone Encounter (Signed)
Pt's daughter [name] is returning call to triage from yesterday

## 2021-02-14 NOTE — Telephone Encounter (Signed)
Pt was seen on 12/2 for cough and congestion. Pt's heart doctor referred her to pulmonary doctor but she is unable to be seen until 12/13 and she is not feeling any better. They would like to know if something can be called in for pneumonia. Please call back and advise.

## 2021-02-14 NOTE — Telephone Encounter (Signed)
Routed CXR result to pt's PCP Dr Livia Snellen for his knowledge and treatment.

## 2021-02-15 ENCOUNTER — Ambulatory Visit: Payer: Medicare HMO | Admitting: Nurse Practitioner

## 2021-02-18 ENCOUNTER — Ambulatory Visit: Payer: Medicare HMO | Admitting: Nurse Practitioner

## 2021-02-19 ENCOUNTER — Other Ambulatory Visit: Payer: Self-pay

## 2021-02-19 ENCOUNTER — Other Ambulatory Visit: Payer: Self-pay | Admitting: Pulmonary Disease

## 2021-02-19 ENCOUNTER — Ambulatory Visit (INDEPENDENT_AMBULATORY_CARE_PROVIDER_SITE_OTHER): Payer: Medicare HMO

## 2021-02-19 ENCOUNTER — Ambulatory Visit: Payer: Medicare HMO | Admitting: Pulmonary Disease

## 2021-02-19 ENCOUNTER — Encounter: Payer: Self-pay | Admitting: Pulmonary Disease

## 2021-02-19 VITALS — BP 158/74 | HR 71 | Temp 97.7°F | Ht 63.0 in | Wt 103.2 lb

## 2021-02-19 DIAGNOSIS — J90 Pleural effusion, not elsewhere classified: Secondary | ICD-10-CM | POA: Diagnosis not present

## 2021-02-19 DIAGNOSIS — J189 Pneumonia, unspecified organism: Secondary | ICD-10-CM | POA: Diagnosis not present

## 2021-02-19 DIAGNOSIS — J449 Chronic obstructive pulmonary disease, unspecified: Secondary | ICD-10-CM | POA: Diagnosis not present

## 2021-02-19 DIAGNOSIS — I517 Cardiomegaly: Secondary | ICD-10-CM | POA: Diagnosis not present

## 2021-02-19 LAB — SEDIMENTATION RATE: Sed Rate: 25 mm/hr (ref 0–30)

## 2021-02-19 LAB — COMPREHENSIVE METABOLIC PANEL
ALT: 17 U/L (ref 0–35)
AST: 20 U/L (ref 0–37)
Albumin: 3.7 g/dL (ref 3.5–5.2)
Alkaline Phosphatase: 75 U/L (ref 39–117)
BUN: 19 mg/dL (ref 6–23)
CO2: 28 mEq/L (ref 19–32)
Calcium: 9.9 mg/dL (ref 8.4–10.5)
Chloride: 103 mEq/L (ref 96–112)
Creatinine, Ser: 1.11 mg/dL (ref 0.40–1.20)
GFR: 43.69 mL/min — ABNORMAL LOW (ref >60.00)
Glucose, Bld: 97 mg/dL (ref 70–99)
Potassium: 3.8 mEq/L (ref 3.5–5.1)
Sodium: 139 mEq/L (ref 135–145)
Total Bilirubin: 0.7 mg/dL (ref 0.2–1.2)
Total Protein: 7.3 g/dL (ref 6.0–8.3)

## 2021-02-19 LAB — CBC
HCT: 38.9 % (ref 36.0–46.0)
Hemoglobin: 12.3 g/dL (ref 12.0–15.0)
MCHC: 31.5 g/dL (ref 30.0–36.0)
MCV: 92.6 fl (ref 78.0–100.0)
Platelets: 410 10*3/uL — ABNORMAL HIGH (ref 150.0–400.0)
RBC: 4.2 Mil/uL (ref 3.87–5.11)
RDW: 14.4 % (ref 11.5–15.5)
WBC: 9.4 10*3/uL (ref 4.0–10.5)

## 2021-02-19 LAB — BRAIN NATRIURETIC PEPTIDE: Pro B Natriuretic peptide (BNP): 785 pg/mL — ABNORMAL HIGH (ref 0.0–100.0)

## 2021-02-19 MED ORDER — LEVOFLOXACIN 500 MG PO TABS: 500.0000 mg | ORAL_TABLET | Freq: Every day | ORAL | 0 refills | Status: DC

## 2021-02-19 NOTE — Progress Notes (Signed)
Subjective:    Patient ID: [name], female    DOB: [birth year], 87 y.o.   MRN: [id number]  HPI  87 year old never smoker presents for evaluation of abnormal chest x-ray and coughing.  She was hospitalized 09/2020 for COVID infection complicated by atrial fibrillation/RVR , converted with amiodarone and has been following with cardiology She developed dyspnea and cough 3 weeks ago, initially treated with Z-Pak for sinusitis and nasal congestion, then was given Augmentin.  She has since tried Robitussin and cetirizine with minimal relief, Tessalon Perles did not provide much relief  She saw cardiology on 12/7 and I have reviewed their consultation.  Chest x-ray showed multifocal airspace disease in the right lung and periphery of left lower lobe with small right effusion, this was new compared to prior chest x-ray from July and concern was raised for multifocal pneumonia. She denies fevers or sputum production. Is no edema, orthopnea or proximal paroxysmal nocturnal dyspnea.  Labs from 11/28 show WBC count of 8.6, BUN/creatinine of 18/1.1 and normal electrolytes She is accompanied by her daughter Claiborne Billings and I was able to speak to her other daughter [name] on the phone and corroborate history  She had been started on Namenda and Aricept for memory issues and these have been discontinued  PMH - post MV repair  - hypertension - memory issues ,on namenda and aricept -Hospitalized 09/27/20 with COVID causing afib and new DCM with TTE showing EF 30-35% MV repair ok, converted with amio   Past Medical History:  Diagnosis Date   Arthritis    Atrial fibrillation ([place])    postoperative   COVID-19    CVA (cerebral vascular accident) ([place])    HTN (hypertension)    MR (mitral regurgitation)    severe   NSTEMI (non-ST elevated myocardial infarction) ([place])    Pericarditis    postoperative   Past Surgical History:  Procedure Laterality Date   ANTERIOR AND POSTERIOR VAGINAL REPAIR  10/15/04    Dr. Nori Riis    anterior repair with perigee graft  04/16/06   posterior repair with apogee graft; lynx mid urethral sling; sacrospinous ligamnet suspension and cystoscopy; Dr. Joya Martyr   MITRAL VALVE REPLACEMENT     sacral spinous ligament suspension of vagina     suprapubic cystectomy     TOTAL ABDOMINAL HYSTERECTOMY W/ BILATERAL SALPINGOOPHORECTOMY      Allergies  Allergen Reactions   Donepezil Other (See Comments)    bradycardia   Actonel [Risedronate Sodium] Nausea And Vomiting   Aspirin-Caffeine     Upset stomach    Codeine     REACTION: upset stomach   Penicillins    Sulfonamide Derivatives     REACTION: swollen tongue    Social History   Socioeconomic History   Marital status: Widowed    Spouse name: Not on file   Number of children: 3   Years of education: Not on file   Highest education level: 9th grade  Occupational History   Occupation: Retired  Tobacco Use   Smoking status: Never   Smokeless tobacco: Never  Vaping Use   Vaping Use: Never used  Substance and Sexual Activity   Alcohol use: No   Drug use: No   Sexual activity: Not on file  Other Topics Concern   Not on file  Social History Narrative   Lives with son   Right Handed    Drinks 2-3  cups caffeine daily   Social Determinants of Health   Financial Resource Strain: Low Risk  Difficulty of Paying Living Expenses: Not hard at all  Food Insecurity: No Food Insecurity   Worried About [place] in the Last Year: Never true   Ran Out of Food in the Last Year: Never true  Transportation Needs: No Transportation Needs   Lack of Transportation (Medical): No   Lack of Transportation (Non-Medical): No  Physical Activity: Not on file  Stress: Not on file  Social Connections: Moderately Integrated   Frequency of Communication with Friends and Family: More than three times a week   Frequency of Social Gatherings with Friends and Family: More than three times a week   Attends Religious  Services: More than 4 times per year   Active Member of [employer] or Organizations: Yes   Attends Archivist Meetings: More than 4 times per year   Marital Status: Widowed  Human resources officer Violence: Not on file     Family History  Problem Relation Age of Onset   Heart disease Father    Lupus Father    Heart attack Father    Heart disease Mother    Colon cancer Brother    Heart disease Brother       Review of Systems  Shortness of breath with activity Irregular heartbeat   Constitutional: negative for anorexia, fevers and sweats  Eyes: negative for irritation, redness and visual disturbance  Ears, nose, mouth, throat, and face: negative for earaches, epistaxis, nasal congestion and sore throat  Respiratory: negative for sputum and wheezing  Cardiovascular: negative for chest pain,  lower extremity edema, orthopnea, palpitations and syncope  Gastrointestinal: negative for abdominal pain, constipation, diarrhea, melena, nausea and vomiting  Genitourinary:negative for dysuria, frequency and hematuria  Hematologic/lymphatic: negative for bleeding, easy bruising and lymphadenopathy  Musculoskeletal:negative for arthralgias, muscle weakness and stiff joints  Neurological: negative for coordination problems, gait problems, headaches and weakness  Endocrine: negative for diabetic symptoms including polydipsia, polyuria and weight loss     Objective:   Physical Exam  Gen. Pleasant, elderly,thin, frail, in no distress, normal affect ENT - no pallor,icterus, no post nasal drip Neck: No JVD, no thyromegaly, no carotid bruits Lungs: no use of accessory muscles, no dullness to percussion,Rt basal dry rales no rhonchi  Cardiovascular: Rhythm regular, heart sounds  normal, no murmurs or gallops, no peripheral edema Abdomen: soft and non-tender, no hepatosplenomegaly, BS normal. Musculoskeletal: No deformities, no cyanosis or clubbing Neuro:  alert, non focal        Assessment & Plan:    Multifocal pneumonia -chest x-ray was repeated and shows decreased right effusion and improvement in right-sided infiltrates.  She does not have fever or leukocytosis, will repeat blood work.  I have provided her with a prescription for Levaquin for 5 to 7 days.  BNP slight high and this raises the question of heart failure, her mitral valve has been repaired and LV function appears to be good.  She does not seem to be in overt heart failure.  Last consideration here is amiodarone toxicity -we will obtain CT chest to clarify, I do not see significant infiltrates on the left lung with CT chest will clarify that.  We will bring her back in 2 weeks to reassess with chest x-ray    Assessment:    1 or more chronic illnesses with severe exacerbation, progression, or side effects of treatment;   1 acute or chronic illness or injury that poses a threat to life or bodily function  Plan Following Extensive Data Review & Interpretation:  I reviewed prior external note(s) from cardiology  I reviewed the result(s) of CXR  I have ordered CXR , CT chest  Independent interpretation of tests  Review of patient's cxr  images revealed decreased RT effusion. The patient's images have been independently reviewed by me.    Discussion of management or test interpretation with another colleague cardiology.

## 2021-02-19 NOTE — Telephone Encounter (Signed)
Name from pharmacy: LEVOFLOXACIN 500MG  TAB       Will file in chart as: levofloxacin (LEVAQUIN) 500 MG tablet    Possible duplicate: Hover to review recent actions on this medication   Sig: TAKE 1 TABLET BY MOUTH ONCE DAILY   Disp:  7 tablet (Pharmacy requested: 7 each)    Refills:  0   Start: 02/19/2021   Class: Normal   Last ordered: Today by Rigoberto Noel, MD    Rx #: [phone]   Pharmacy comment: this interacts with her amiodarone. still ok to dispense?    Pharmacy states levaquin interacts with her amiodarone. They want to know if its still okay to dispense?   DR. Alva please advise.

## 2021-02-19 NOTE — Patient Instructions (Addendum)
CXR today >> appears better  Blood work today  Possibilities include pneumonia, fluid or amiodarone   X Rx for leavquin 500 mg daily x 5  X CT chest WO contrast

## 2021-02-20 ENCOUNTER — Telehealth: Payer: Self-pay | Admitting: Pulmonary Disease

## 2021-02-20 MED ORDER — LEVOFLOXACIN 500 MG PO TABS: 500.0000 mg | ORAL_TABLET | Freq: Every day | ORAL | 0 refills | Status: DC

## 2021-02-20 NOTE — Telephone Encounter (Signed)
Called [name] back and there was no answer- [place] and spoke with the pharmacist- levaquin should not be taken with amiodarone- can cause increased qt interval  Dr Elsworth Soho, please advise thanks!

## 2021-02-20 NOTE — Telephone Encounter (Signed)
-----   Message from Josue Hector, MD sent at 02/19/2021  2:03 PM EST ----- Pam- call patient and have her stop amiodarone  ----- Message ----- From: Rigoberto Noel, MD Sent: 02/19/2021   1:35 PM EST To: Josue Hector, MD, [name], MD

## 2021-02-20 NOTE — Telephone Encounter (Signed)
Called patient to let her know to stop taking amiodarone. Called pharmacy to let them know patient is stopping her amiodarone. Patient and pharmacist verbalized understanding.

## 2021-02-20 NOTE — Telephone Encounter (Signed)
Michaelyn Barter, RN to Me     10:06 AM  FYI  Michaelyn Barter, RN     10:05 AM Note Called patient to let her know to stop taking amiodarone. Called pharmacy to let them know patient is stopping her amiodarone. Patient and pharmacist verbalized understanding.

## 2021-02-25 ENCOUNTER — Other Ambulatory Visit: Payer: Self-pay

## 2021-02-25 MED ORDER — FUROSEMIDE 20 MG PO TABS: 20.0000 mg | ORAL_TABLET | Freq: Every day | ORAL | 0 refills | Status: DC

## 2021-02-25 NOTE — Progress Notes (Signed)
Tried calling the pt and there was no answer- LMTCB.  

## 2021-02-28 ENCOUNTER — Other Ambulatory Visit: Payer: Self-pay

## 2021-02-28 ENCOUNTER — Ambulatory Visit (HOSPITAL_COMMUNITY)
Admission: RE | Admit: 2021-02-28 | Discharge: 2021-02-28 | Disposition: A | Discharge status: Home / Self Care | Payer: Medicare HMO | Source: Ambulatory Visit | Attending: Pulmonary Disease | Admitting: Pulmonary Disease

## 2021-02-28 DIAGNOSIS — J189 Pneumonia, unspecified organism: Secondary | ICD-10-CM | POA: Diagnosis not present

## 2021-02-28 DIAGNOSIS — J449 Chronic obstructive pulmonary disease, unspecified: Secondary | ICD-10-CM | POA: Diagnosis not present

## 2021-02-28 DIAGNOSIS — I700 Atherosclerosis of aorta: Secondary | ICD-10-CM | POA: Diagnosis not present

## 2021-02-28 DIAGNOSIS — J90 Pleural effusion, not elsewhere classified: Secondary | ICD-10-CM | POA: Diagnosis not present

## 2021-02-28 DIAGNOSIS — R911 Solitary pulmonary nodule: Secondary | ICD-10-CM | POA: Diagnosis not present

## 2021-03-05 ENCOUNTER — Other Ambulatory Visit: Payer: Self-pay

## 2021-03-05 ENCOUNTER — Encounter: Payer: Self-pay | Admitting: Nurse Practitioner

## 2021-03-05 ENCOUNTER — Ambulatory Visit: Payer: Medicare HMO | Admitting: Nurse Practitioner

## 2021-03-05 VITALS — BP 128/60 | HR 55 | Temp 97.8°F | Ht 63.0 in | Wt 101.4 lb

## 2021-03-05 DIAGNOSIS — J189 Pneumonia, unspecified organism: Secondary | ICD-10-CM

## 2021-03-05 DIAGNOSIS — R0600 Dyspnea, unspecified: Secondary | ICD-10-CM | POA: Diagnosis not present

## 2021-03-05 NOTE — Assessment & Plan Note (Addendum)
Given significant improvement clinically, will repeat CT chest in 4-6 weeks for evaluation of resolution on imaging. Discussed with Dr. Lamonte Sakai who reviewed CT scan and case and agreed with plan. Will discuss with Dr Elsworth Soho as well. Advised to notify if any worsening or new symptoms occur. No further abx indicated at this time as CT chest was completed 2 days after Levaquin was completed. Follow up after CT chest or PRN.   Patient Instructions  We will repeat your CT chest in 4-6 weeks to assess for resolution of pneumonia. Notify if fevers, SOB, or cough returns.   Follow up with cardiology, as scheduled.   Follow up with Dr. Elsworth Soho after CT chest scan. If symptoms do not improve or worsen, please contact office for sooner follow up or seek emergency care.

## 2021-03-05 NOTE — Assessment & Plan Note (Signed)
Resolved per pt. Noted to have hyperinflation, consistent with COPD on CT chest and CXR. If cough persists or SOB returns with resolution of pneumonia, PFTs would be indicated. See above.

## 2021-03-05 NOTE — Progress Notes (Signed)
@Patient  ID: [name], female    DOB: 01-22-1929, 87 y.o.   MRN: [id number]  Chief Complaint  Patient presents with   Follow-up    Patient says she thinks she's getting better.     Referring provider: [name], MD  HPI: 87 year old female, never smoker followed for multifocal pneumonia and dyspnea.  She is a patient of Dr. Bari Mantis and was last seen on 02/19/2021 for consultation after referral by cardiologist.  Past medical history significant for pulmonary hypertension, A. Fib on chronic anticoagulation and amiodarone therapy, hypertension, mitral valve failure status post valve replacement, hypothyroidism, TIAs, mild cognitive impairment.  TEST/EVENTS:  01/11/2019 echocardiogram: LVEF 35 to 40%, mild improvement from previous.  Mild LVH.  RV function and size normal.  Moderately elevated pulmonary artery systolic pressure.  Left atrium moderately dilated. right atrium mildly dilated.  MV valve has been repaired with mild regurgitation.  Moderate tricuspid regurgitation.  Mild AV regurgitation. 02/12/2021 CXR 2 view: Multifocal airspace disease throughout the right lung and peripheral left lower lung, most prominent in the periphery of the right upper lobe.  Small right and trace left pleural effusion.  Unchanged calcified mediastinal lymph nodes and left apical calcified granuloma. 02/19/2021 CXR 2 view: Pulmonary hyperinflation again seen, consistent with COPD.  Focal area of airspace opacity in the inferior right upper lobe shows no significant change.  Small right pleural effusion is also stable. 03/01/2021 CT chest without contrast: Mild global cardiomegaly.  MV replacement has been performed.  Central pulmonary arteries enlarged in keeping with changes of pulmonary arterial hypertension.  Atherosclerosis.  Mild dilation of the proximal descending thoracic aorta measuring 3.2 cm in diameter.  Lungs mildly hyperinflated in keeping with changes of underlying COPD.  Focal consolidation  within the lateral segment of the right upper lobe and within the basilar lingula is identified, grossly similar in appearance to prior chest x-ray.  Small right pleural effusion is present.  Scattered nodular infiltrate within the basilar lingula and lower lobes bilaterally may be infectious or inflammatory in nature given the associated findings.  02/12/2021: OV with cardiology, [name].  Reported increased dyspnea over the past week with sinusitis and productive cough.  Started on Augmentin. CXR showed multifocal airspace disease in the right lung and peripheral left lower lung.  Small right and trace left pleural effusion, concerning for multifocal pneumonia.  Referred to pulmonology.  02/19/2021: OV with Dr. Elsworth Soho consult for dyspnea and multifocal pneumonia. Tx with levaquin for 7 days. Cardiology stopped amiodarone and told pt to resume once completed levaquin. CT chest ordered with follow up after.  03/05/2021: Today - 2 week follow up Patient presents today with daughter for 2 week follow up after being treated for multifocal pneumonia. She finished her levaquin on 12/21 and had a CT chest on 12/23 that showed persistent multifocal pulmonary infiltrates with more focal consolidation in the right upper lobe and small right pleural effusion. However, today she reports resolution of her dyspneic symptoms and significant improvement of her cough. She describes her cough as minimal and non-productive. She feels much better and is able to perform daily activities without any difficulties. She denies recent fevers, chills, or body aches. She denies orthopnea, shortness of breath, PND, chest pain, hemoptysis, or lower extremity swelling. She has resumed her amiodarone, as instructed by her cardiologist, and has not had any difficulties. Overall, she feels well and has had significant clinical improved.   Allergies  Allergen Reactions   Donepezil Other (See Comments)  bradycardia   Actonel  [Risedronate Sodium] Nausea And Vomiting   Aspirin-Caffeine     Upset stomach    Codeine     REACTION: upset stomach   Penicillins    Sulfonamide Derivatives     REACTION: swollen tongue    Immunization History  Administered Date(s) Administered   Fluad Quad(high Dose 65+) 12/13/2018, 01/31/2020, 01/15/2021   Influenza, High Dose Seasonal PF 01/22/2016, 01/06/2017, 02/09/2018   Influenza,inj,Quad PF,6+ Mos 12/27/2012, 12/06/2013, 03/16/2015   Influenza-Unspecified 01/02/2009, 01/18/2010, 02/14/2011   Moderna Sars-Covid-2 Vaccination 05/11/2019, 06/08/2019, 03/09/2020   Pneumococcal Conjugate-13 12/06/2013   Pneumococcal Polysaccharide-23 04/05/2019   Td 06/15/2017    Past Medical History:  Diagnosis Date   Arthritis    Atrial fibrillation ([place])    postoperative   COVID-19    CVA (cerebral vascular accident) ([place])    HTN (hypertension)    MR (mitral regurgitation)    severe   NSTEMI (non-ST elevated myocardial infarction) ([place])    Pericarditis    postoperative    Tobacco History: Social History   Tobacco Use  Smoking Status Never  Smokeless Tobacco Never   Counseling given: Not Answered   Outpatient Medications Prior to Visit  Medication Sig Dispense Refill   apixaban (ELIQUIS) 2.5 MG TABS tablet Take 1 tablet (2.5 mg total) by mouth 2 (two) times daily. 180 tablet 3   atorvastatin (LIPITOR) 10 MG tablet Take 1 tablet (10 mg total) by mouth daily. 90 tablet 3   benzonatate (TESSALON) 200 MG capsule Take 1 capsule (200 mg total) by mouth 3 (three) times daily as needed for cough. 20 capsule 0   Biotin 1000 MCG tablet Take 1,000 mcg by mouth daily.     cetirizine (ZYRTEC) 5 MG tablet Take 1 tablet (5 mg total) by mouth daily. 30 tablet 1   Cholecalciferol (VITAMIN D3) 10000 UNITS capsule Take 10,000 Units by mouth daily.       furosemide (LASIX) 20 MG tablet Take 1 tablet (20 mg total) by mouth daily. 5 tablet 0   levofloxacin (LEVAQUIN) 500 MG tablet Take 1  tablet (500 mg total) by mouth daily. 7 tablet 0   losartan (COZAAR) 50 MG tablet Take 25 mg by mouth daily.     metoprolol succinate (TOPROL-XL) 25 MG 24 hr tablet Take 1 tablet (25 mg total) by mouth daily. 30 tablet 3   No facility-administered medications prior to visit.     Review of Systems:   Constitutional: No weight loss or gain, night sweats, fevers, chills, fatigue, or lassitude. HEENT: No headaches, difficulty swallowing, tooth/dental problems, or sore throat. No sneezing, itching, ear ache, nasal congestion, or post nasal drip CV:  No chest pain, orthopnea, PND, swelling in lower extremities, anasarca, dizziness, palpitations, syncope Resp: +minimal nonproductive cough with significant improvement. No shortness of breath with exertion or at rest. No excess mucus or change in color of mucus. No hemoptysis. No wheezing.  No chest wall deformity GI:  No heartburn, indigestion, abdominal pain, nausea, vomiting, diarrhea, change in bowel habits, loss of appetite, bloody stools.  GU: No dysuria, change in color of urine, urgency or frequency.  No flank pain, no hematuria  Skin: No rash, lesions, ulcerations MSK:  No joint pain or swelling.  No decreased range of motion.  No back pain. Neuro: No dizziness or lightheadedness.  Psych: No depression or anxiety. Mood stable.     Physical Exam:  BP 128/60 (BP Location: Left Arm, Patient Position: Sitting, Cuff Size: Normal)  Pulse (!) 55    Temp 97.8 F (36.6 C) (Oral)    Ht 5\' 3"  (1.6 m)    Wt 101 lb 6.4 oz (46 kg)    SpO2 97%    BMI 17.96 kg/m   GEN: Pleasant, interactive, well-appearing; in no acute distress. HEENT:  Normocephalic and atraumatic. EACs patent bilaterally. TM pearly gray with present light reflex bilaterally. PERRLA. Sclera white. Nasal turbinates pink, moist and patent bilaterally. No rhinorrhea present. Oropharynx pink and moist, without exudate or edema. No lesions, ulcerations, or postnasal drip.  NECK:  Supple  w/ fair ROM. No JVD present. Normal carotid impulses w/o bruits. Thyroid symmetrical with no goiter or nodules palpated. No lymphadenopathy.   CV: RRR, no m/r/g, no peripheral edema. Pulses intact, +2 bilaterally. No cyanosis, pallor or clubbing. PULMONARY:  Unlabored, regular breathing. Clear bilaterally A&P w/o wheezes/rales/rhonchi. No accessory muscle use. No dullness to percussion. GI: BS present and normoactive. Soft, non-tender to palpation. No organomegaly or masses detected. No CVA tenderness. MSK: No erythema, warmth or tenderness. Cap refil <2 sec all extrem. No deformities or joint swelling noted.  Neuro: A/Ox3. No focal deficits noted.   Skin: Warm, no lesions or rashe Psych: Normal affect and behavior. Judgement and thought content appropriate.     Lab Results:  CBC    Component Value Date/Time   WBC 9.4 02/19/2021 1053   RBC 4.20 02/19/2021 1053   HGB 12.3 02/19/2021 1053   HGB 12.1 02/04/2021 1346   HCT 38.9 02/19/2021 1053   HCT 36.5 02/04/2021 1346   PLT 410.0 (H) 02/19/2021 1053   PLT 250 02/04/2021 1346   MCV 92.6 02/19/2021 1053   MCV 90 02/04/2021 1346   MCH 29.7 02/04/2021 1346   MCH 30.5 10/01/2020 0500   MCHC 31.5 02/19/2021 1053   RDW 14.4 02/19/2021 1053   RDW 12.1 02/04/2021 1346   LYMPHSABS 1.1 02/04/2021 1346   MONOABS 0.5 10/01/2020 0500   EOSABS 0.2 02/04/2021 1346   BASOSABS 0.0 02/04/2021 1346    BMET    Component Value Date/Time   NA 139 02/19/2021 1053   NA 143 02/04/2021 1346   K 3.8 02/19/2021 1053   CL 103 02/19/2021 1053   CO2 28 02/19/2021 1053   GLUCOSE 97 02/19/2021 1053   BUN 19 02/19/2021 1053   BUN 18 02/04/2021 1346   CREATININE 1.11 02/19/2021 1053   CALCIUM 9.9 02/19/2021 1053   GFRNONAA 53 (L) 10/03/2020 0443   GFRAA 55 (L) 04/26/2020 1131    BNP    Component Value Date/Time   BNP 327.7 (H) 09/28/2020 0804     Imaging:  DG Chest 2 View  Result Date: 02/20/2021 CLINICAL DATA:  Follow-up pneumonia. EXAM:  CHEST - 2 VIEW COMPARISON:  02/12/2021 FINDINGS: Stable mild cardiomegaly and ectasia of the thoracic aorta. Pulmonary hyperinflation again seen, consistent with COPD. Focal area of airspace opacity in the inferior right upper lobe shows no significant change. Small right pleural effusion is also stable. No new or worsening areas of pulmonary opacity are seen. IMPRESSION: No significant change in right upper lobe airspace opacity and small right pleural effusion. COPD. Electronically Signed   By: Marlaine Hind M.D.   On: 02/20/2021 09:35   DG Chest 2 View  Result Date: 02/13/2021 CLINICAL DATA:  Bronchitis and dyspnea on exertion. Cough for 2 weeks. EXAM: CHEST - 2 VIEW COMPARISON:  X-ray chest 09/30/2020. FINDINGS: Unchanged cardiomediastinal silhouette. There is multifocal airspace disease throughout the right lung and peripheral  left lower lung, most prominent in the periphery of the right upper lobe. There is a small right and trace left pleural effusion. Unchanged calcified mediastinal lymph nodes and left apical calcified granuloma. No visible pneumothorax. Thoracic spondylosis. No acute osseous abnormality. IMPRESSION: Multifocal airspace disease in the right lung and peripheral left lower lung, most confluent in the right upper lobe. Small right and trace left pleural effusions. Findings are new prior exam in July and is concerning for multifocal pneumonia. These results will be called to the ordering clinician or representative by the Radiologist Assistant, and communication documented in the PACS or [employer]. Electronically Signed   By: Maurine Simmering M.D.   On: 02/13/2021 10:23   CT CHEST WO CONTRAST  Result Date: 03/01/2021 CLINICAL DATA:  lung nodules.  Chest congestion, recent pneumonia. EXAM: CT CHEST WITHOUT CONTRAST TECHNIQUE: Multidetector CT imaging of the chest was performed following the standard protocol without IV contrast. COMPARISON:  None. FINDINGS: Cardiovascular: No  significant coronary artery calcification. Mild global cardiomegaly. Mitral valve replacement has been performed. No pericardial effusion. Central pulmonary arteries are enlarged in keeping with changes of pulmonary arterial hypertension. Moderate atherosclerotic calcification within the thoracic aorta. Mild dilation of the a proximal descending thoracic aorta measuring 3.2 cm in diameter beyond the takeoff of the left subclavian artery. Ascending aorta is of normal caliber. Mediastinum/Nodes: Visualized thyroid is unremarkable. No pathologic thoracic adenopathy. Densely calcified lymph nodes within the prevascular lymph node group are likely related to old granulomatous disease. Esophagus is unremarkable. Lungs/Pleura: The lungs are mildly hyperinflated in keeping with changes of underlying COPD. Focal consolidation within the a lateral segment of the right upper lobe and within the basilar lingula is identified, grossly similar in appearance to prior chest radiograph of 02/12/2021 and new from chest radiograph of 09/26/2020, likely infectious in etiology. Small right pleural effusion is present. Scattered nodular infiltrate within the basilar lingula and lower lobes bilaterally may be infectious or inflammatory in nature given the associated findings. No pneumothorax. No central obstructing lesion. Upper Abdomen: No acute abnormality. Musculoskeletal: No acute bone abnormality. No lytic or blastic bone lesion. IMPRESSION: Multifocal pulmonary infiltrates with more focal consolidation within the peripheral right upper lobe likely infectious in the acute setting. Follow-up evaluation, however, is recommended in 4-6 weeks to document complete resolution. Small right pleural effusion, possibly a parapneumonic effusion. Mild cardiomegaly. Morphologic changes in keeping with pulmonary arterial hypertension. Mild dilation of the a proximal descending thoracic aorta. Maximal diameter of 3.2 cm within the arch. Recommend  annual imaging followup by CTA or MRA. This recommendation follows 2010 ACCF/AHA/AATS/ACR/ASA/SCA/SCAI/SIR/STS/SVM Guidelines for the Diagnosis and Management of Patients with Thoracic Aortic Disease. Circulation.2010; 121: O270-J500. Aortic aneurysm NOS (ICD10-I71.9) COPD Aortic Atherosclerosis (ICD10-I70.0). Electronically Signed   By: Fidela Salisbury M.D.   On: 03/01/2021 02:00      No flowsheet data found.  No results found for: NITRICOXIDE      Assessment & Plan:   Multifocal pneumonia Given significant improvement clinically, will repeat CT chest in 4-6 weeks for evaluation of resolution on imaging. Discussed with Dr. Lamonte Sakai who reviewed CT scan and case and agreed with plan. Will discuss with Dr Elsworth Soho as well. Advised to notify if any worsening or new symptoms occur. No further abx indicated at this time as CT chest was completed 2 days after Levaquin was completed. Follow up after CT chest or PRN.   Patient Instructions  We will repeat your CT chest in 4-6 weeks to assess for resolution  of pneumonia. Notify if fevers, SOB, or cough returns.   Follow up with cardiology, as scheduled.   Follow up with Dr. Elsworth Soho after CT chest scan. If symptoms do not improve or worsen, please contact office for sooner follow up or seek emergency care.    Dyspnea Resolved per pt. Noted to have hyperinflation, consistent with COPD on CT chest and CXR. If cough persists or SOB returns with resolution of pneumonia, PFTs would be indicated. See above.      Clayton Bibles, NP 03/05/2021  Pt aware and understands NP's role.

## 2021-03-05 NOTE — Patient Instructions (Signed)
We will repeat your CT chest in 4-6 weeks to assess for resolution of pneumonia. Notify if fevers, SOB, or cough returns.   Follow up with cardiology, as scheduled.   Follow up with Dr. Elsworth Soho after CT chest scan. If symptoms do not improve or worsen, please contact office for sooner follow up or seek emergency care.

## 2021-03-24 ENCOUNTER — Other Ambulatory Visit: Payer: Self-pay | Admitting: Family Medicine

## 2021-03-25 NOTE — Telephone Encounter (Signed)
Not on current med list but is listed on med list from office visit on 02/04/21.

## 2021-03-29 ENCOUNTER — Telehealth: Payer: Self-pay | Admitting: Family Medicine

## 2021-03-29 ENCOUNTER — Other Ambulatory Visit: Payer: Self-pay

## 2021-03-29 MED ORDER — METOPROLOL SUCCINATE ER 25 MG PO TB24: 25.0000 mg | ORAL_TABLET | Freq: Every day | ORAL | 3 refills | Status: DC

## 2021-03-29 NOTE — Telephone Encounter (Signed)
°  Prescription Request  03/29/2021  Is this a "Controlled Substance" medicine? no  Have you seen your PCP in the last 2 weeks? no  If YES, route message to pool  -  If NO, patient needs to be scheduled for appointment.  What is the name of the medication or equipment? Metoprolol  Have you contacted your pharmacy to request a refill? yes   Which pharmacy would you like this sent to? [facility]   Patient notified that their request is being sent to the clinical staff for review and that they should receive a response within 2 business days.    [facility] pt.  She is completely out!

## 2021-03-29 NOTE — Telephone Encounter (Signed)
Informed pt that Dr. Johnsie Cancel filled today

## 2021-04-03 ENCOUNTER — Telehealth: Payer: Self-pay | Admitting: Family Medicine

## 2021-04-03 NOTE — Telephone Encounter (Signed)
Would need blood work to check folate level. That requires an office visit.

## 2021-04-03 NOTE — Telephone Encounter (Signed)
°  Prescription Request  04/03/2021  Is this a "Controlled Substance" medicine? no  Have you seen your PCP in the last 2 weeks? 01/2021  If YES, route message to pool  -  If NO, patient needs to be scheduled for appointment.  What is the name of the medication or equipment? Folic acid  Have you contacted your pharmacy to request a refill? no   Which pharmacy would you [facility] this sent to? [facility]   Patient notified that their request is being sent to the clinical staff for review and that they should receive a response within 2 business days.

## 2021-04-03 NOTE — Telephone Encounter (Signed)
Folic Acid no longer on med list, DC'd as pt preference at 10/31/20 visit Originally Rx'd by Dr. Leonie Man Neurologist Please advise

## 2021-04-04 NOTE — Telephone Encounter (Signed)
Pt aware and says she has a follow up 05/08/21 with [facility] and will just wait till then.

## 2021-04-11 DIAGNOSIS — C4441 Basal cell carcinoma of skin of scalp and neck: Secondary | ICD-10-CM | POA: Diagnosis not present

## 2021-04-17 ENCOUNTER — Other Ambulatory Visit: Payer: Self-pay

## 2021-04-17 ENCOUNTER — Ambulatory Visit (HOSPITAL_COMMUNITY)
Admission: RE | Admit: 2021-04-17 | Discharge: 2021-04-17 | Disposition: A | Discharge status: Home / Self Care | Payer: Medicare HMO | Source: Ambulatory Visit | Attending: Nurse Practitioner | Admitting: Nurse Practitioner

## 2021-04-17 DIAGNOSIS — J479 Bronchiectasis, uncomplicated: Secondary | ICD-10-CM | POA: Diagnosis not present

## 2021-04-17 DIAGNOSIS — J189 Pneumonia, unspecified organism: Secondary | ICD-10-CM | POA: Insufficient documentation

## 2021-04-17 DIAGNOSIS — J90 Pleural effusion, not elsewhere classified: Secondary | ICD-10-CM | POA: Diagnosis not present

## 2021-04-17 DIAGNOSIS — I7121 Aneurysm of the ascending aorta, without rupture: Secondary | ICD-10-CM | POA: Diagnosis not present

## 2021-04-17 DIAGNOSIS — I700 Atherosclerosis of aorta: Secondary | ICD-10-CM | POA: Diagnosis not present

## 2021-04-19 NOTE — Progress Notes (Signed)
Notified pt's daughter, [name], who is DPR., that the pt's CT scan showed persistent but improving multifocal infiltrates in the lungs. Persistent small right pleural effusion. Linear scarring in the lung bases. Cardiac enlargement. 4 cm diameter ascending aortic aneurysm - recommend continued annual follow up and follow up PCP. Bronchiectasis with mucous plugging in RML. Calcified granulomas. Pt is doing well and strength has increased per daughter. Advised to notify if anything changes. Follow up already scheduled with Dr. Elsworth Soho. Verbalized understanding.

## 2021-04-30 ENCOUNTER — Ambulatory Visit: Payer: Medicare HMO | Admitting: Neurology

## 2021-05-01 ENCOUNTER — Other Ambulatory Visit: Payer: Self-pay | Admitting: Family Medicine

## 2021-05-01 NOTE — Telephone Encounter (Signed)
Last office visit 02/04/21 Upcoming appointment 05/08/21 On patient med list but does not show prescribed by you

## 2021-05-08 ENCOUNTER — Encounter: Payer: Self-pay | Admitting: Family Medicine

## 2021-05-08 ENCOUNTER — Ambulatory Visit (INDEPENDENT_AMBULATORY_CARE_PROVIDER_SITE_OTHER): Payer: Medicare HMO | Admitting: Family Medicine

## 2021-05-08 VITALS — BP 171/72 | HR 74 | Temp 97.4°F | Ht 63.0 in | Wt 103.4 lb

## 2021-05-08 DIAGNOSIS — I10 Essential (primary) hypertension: Secondary | ICD-10-CM | POA: Diagnosis not present

## 2021-05-08 DIAGNOSIS — E782 Mixed hyperlipidemia: Secondary | ICD-10-CM | POA: Diagnosis not present

## 2021-05-08 MED ORDER — LOSARTAN POTASSIUM 100 MG PO TABS: 100.0000 mg | ORAL_TABLET | Freq: Every day | ORAL | 3 refills | Status: DC

## 2021-05-08 NOTE — Progress Notes (Signed)
? ?Subjective:  ?Patient ID: [name], female    DOB: 1929/02/12  Age: 88 y.o. MRN: [id number] ? ?CC: Medical Management of Chronic Issues ? ? ?HPI ?[name] presents for  follow-up of hypertension. Patient has no history of headache chest pain or shortness of breath or recent cough. Patient also denies symptoms of TIA such as focal numbness or weakness. Patient denies side effects from medication. States taking it regularly. ? ? in for follow-up of elevated cholesterol. Doing well without complaints on current medication. Denies side effects of statin including myalgia and arthralgia and nausea. Currently no chest pain, shortness of breath or other cardiovascular related symptoms noted. ? ?Patient had pneumonia last month.  CT scan performed of chest.  Ascending aortic aneurysm noted on recent CT scan.  Patient and her daughter want to know the significance of this finding.  Noted was some residual but improving infiltrate from previous pneumonia on the same scan.  She had some cardiac enlargement and bronchiectasis as well. ?History ?Velia has a past medical history of Arthritis, Atrial fibrillation ([place]), COVID-19, CVA (cerebral vascular accident) ([place]), HTN (hypertension), MR (mitral regurgitation), NSTEMI (non-ST elevated myocardial infarction) ([place]), and Pericarditis.  ? ?She has a past surgical history that includes anterior repair with perigee graft (04/16/06); Anterior and posterior vaginal repair (10/15/04); sacral spinous ligament suspension of vagina; suprapubic cystectomy; Total abdominal hysterectomy w/ bilateral salpingoophorectomy; and Mitral valve replacement.  ? ?Her family history includes Colon cancer in her brother; Heart attack in her father; Heart disease in her brother, father, and mother; Lupus in her father.She reports that she has never smoked. She has never used smokeless tobacco. She reports that she does not drink alcohol and does not use drugs. ? ?Current Outpatient Medications on  File Prior to Visit  ?Medication Sig Dispense Refill  ? apixaban (ELIQUIS) 2.5 MG TABS tablet Take 1 tablet (2.5 mg total) by mouth 2 (two) times daily. 180 tablet 3  ? atorvastatin (LIPITOR) 10 MG tablet Take 1 tablet (10 mg total) by mouth daily. 90 tablet 3  ? Biotin 1000 MCG tablet Take 1,000 mcg by mouth daily.    ? cetirizine (ZYRTEC) 5 MG tablet Take 1 tablet (5 mg total) by mouth daily. 30 tablet 1  ? Cholecalciferol (VITAMIN D3) 10000 UNITS capsule Take 10,000 Units by mouth daily.      ? furosemide (LASIX) 20 MG tablet Take 1 tablet (20 mg total) by mouth daily. 5 tablet 0  ? levofloxacin (LEVAQUIN) 500 MG tablet Take 1 tablet (500 mg total) by mouth daily. 7 tablet 0  ? metoprolol succinate (TOPROL-XL) 25 MG 24 hr tablet Take 1 tablet (25 mg total) by mouth daily. 90 tablet 3  ? ?No current facility-administered medications on file prior to visit.  ? ? ?ROS ?Review of Systems  ?Constitutional: Negative.   ?HENT: Negative.    ?Eyes:  Negative for visual disturbance.  ?Respiratory:  Negative for shortness of breath.   ?Cardiovascular:  Negative for chest pain.  ?Gastrointestinal:  Negative for abdominal pain.  ?Musculoskeletal:  Negative for arthralgias.  ? ?Objective:  ?BP (!) 171/72   Pulse 74   Temp (!) 97.4 ?F (36.3 ?C)   Ht _0  (1.6 m)   Wt 103 lb 6.4 oz (46.9 kg)   SpO2 97%   BMI 18.32 kg/m?  ? ?BP Readings from Last 3 Encounters:  ?05/08/21 (!) 171/72  ?03/05/21 128/60  ?02/19/21 (!) 158/74  ? ? ?Wt Readings from Last 3  Encounters:  ?05/08/21 103 lb 6.4 oz (46.9 kg)  ?03/05/21 101 lb 6.4 oz (46 kg)  ?02/19/21 103 lb 3.2 oz (46.8 kg)  ? ? ? ?Physical Exam ?Constitutional:   ?   General: She is not in acute distress. ?   Appearance: She is well-developed.  ?HENT:  ?   Head: Normocephalic and atraumatic.  ?Eyes:  ?   Conjunctiva/sclera: Conjunctivae normal.  ?   Pupils: Pupils are equal, round, and reactive to light.  ?Neck:  ?   Thyroid: No thyromegaly.  ?Cardiovascular:  ?   Rate and Rhythm:  Normal rate and regular rhythm.  ?   Heart sounds: Normal heart sounds. No murmur heard. ?Pulmonary:  ?   Effort: Pulmonary effort is normal. No respiratory distress.  ?   Breath sounds: Normal breath sounds. No wheezing or rales.  ?Abdominal:  ?   General: Bowel sounds are normal. There is no distension.  ?   Palpations: Abdomen is soft.  ?   Tenderness: There is no abdominal tenderness.  ?Musculoskeletal:     ?   General: Normal range of motion.  ?   Cervical back: Normal range of motion and neck supple.  ?Lymphadenopathy:  ?   Cervical: No cervical adenopathy.  ?Skin: ?   General: Skin is warm and dry.  ?Neurological:  ?   Mental Status: She is alert and oriented to person, place, and time.  ?Psychiatric:     ?   Behavior: Behavior normal.     ?   Thought Content: Thought content normal.     ?   Judgment: Judgment normal.  ? ? ? ? ?Assessment & Plan:  ? ?Tanga was seen today for medical management of chronic issues. ? ?Diagnoses and all orders for this visit: ? ?Essential hypertension ?-     CBC with Differential/Platelet ?-     CMP14+EGFR ?-     Folate ? ?Mixed hyperlipidemia ?-     Lipid panel ?-     Folate ? ?Other orders ?-     losartan (COZAAR) 100 MG tablet; Take 1 tablet (100 mg total) by mouth daily. ? ? ?Allergies as of 05/08/2021   ? ?   Reactions  ? Donepezil Other (See Comments)  ? bradycardia  ? Actonel [risedronate Sodium] Nausea And Vomiting  ? Aspirin-caffeine   ? Upset stomach  ? Codeine   ? REACTION: upset stomach  ? Memantine   ? Penicillins   ? Sulfonamide Derivatives   ? REACTION: swollen tongue  ? ?  ? ?  ?Medication List  ?  ? ?  ? Accurate as of May 08, 2021  8:59 PM. If you have any questions, ask your nurse or doctor.  ?  ?  ? ?  ? ?STOP taking these medications   ? ?benzonatate 200 MG capsule ?Commonly known as: TESSALON ?Stopped by: [name], MD ?  ? ?  ? ?TAKE these medications   ? ?apixaban 2.5 MG Tabs tablet ?Commonly known as: ELIQUIS ?Take 1 tablet (2.5 mg total) by mouth 2  (two) times daily. ?  ?atorvastatin 10 MG tablet ?Commonly known as: LIPITOR ?Take 1 tablet (10 mg total) by mouth daily. ?  ?Biotin 1000 MCG tablet ?Take 1,000 mcg by mouth daily. ?  ?cetirizine 5 MG tablet ?Commonly known as: ZYRTEC ?Take 1 tablet (5 mg total) by mouth daily. ?  ?furosemide 20 MG tablet ?Commonly known as: Lasix ?Take 1 tablet (20 mg total) by mouth daily. ?  ?  levofloxacin 500 MG tablet ?Commonly known as: LEVAQUIN ?Take 1 tablet (500 mg total) by mouth daily. ?  ?losartan 100 MG tablet ?Commonly known as: COZAAR ?Take 1 tablet (100 mg total) by mouth daily. ?What changed:  ?medication strength ?how much to take ?Changed by: [name], MD ?  ?metoprolol succinate 25 MG 24 hr tablet ?Commonly known as: TOPROL-XL ?Take 1 tablet (25 mg total) by mouth daily. ?  ?Vitamin D3 250 MCG (10000 UT) capsule ?Take 10,000 Units by mouth daily. ?  ? ?  ? ? ?Meds ordered this encounter  ?Medications  ? losartan (COZAAR) 100 MG tablet  ?  Sig: Take 1 tablet (100 mg total) by mouth daily.  ?  Dispense:  90 tablet  ?  Refill:  3  ? ? ?We will monitor with her the bronchiectasis and treat as symptoms make it necessary.  Will consider follow-up on the aneurysm.  However due to age and size treatment may not be advisable.  Pros and cons discussed with the patient. ? ?Follow-up: Return in about 3 months (around 08/08/2021). ? ?[name], M.D. ?

## 2021-05-08 NOTE — Patient Instructions (Signed)
Ascending Aortic Aneurysm 4 cm. Too small to do anything with it. Can monitor anually, but treatment not advisable due to age and location of the aneurysm. ?

## 2021-05-09 ENCOUNTER — Ambulatory Visit (INDEPENDENT_AMBULATORY_CARE_PROVIDER_SITE_OTHER): Payer: Medicare HMO

## 2021-05-09 VITALS — Wt 103.0 lb

## 2021-05-09 DIAGNOSIS — Z0000 Encounter for general adult medical examination without abnormal findings: Secondary | ICD-10-CM

## 2021-05-09 NOTE — Patient Instructions (Signed)
[name] , Thank you for taking time to come for your Medicare Wellness Visit. I appreciate your ongoing commitment to your health goals. Please review the following plan we discussed and let me know if I can assist you in the future.   Screening recommendations/referrals: Colonoscopy: No longer required Mammogram: No longer required Bone Density: Done 04/05/2019 - may repeat every 2 years Recommended yearly ophthalmology/optometry visit for glaucoma screening and checkup Recommended yearly dental visit for hygiene and checkup  Vaccinations: Influenza vaccine: Done 01/15/2021 - Repeat annually in fall Pneumococcal vaccine: Done 12/06/2013 & 04/05/2019 Tdap vaccine: done 06/15/2017 Shingles vaccine: Due - recommend 2 doses 2-6 months apart   Covid-19: Done 05/11/2019, 06/08/2019, & 03/09/2020  Advanced directives: Please bring a copy of your health care power of attorney and living will to the office to be added to your chart at your convenience.   Conditions/risks identified: Aim for 30 minutes of exercise and/or walking, 6-8 glasses of water, and 5 servings of fruits and vegetables each day.   Next appointment: Follow up in one year for your annual wellness visit    Preventive Care 65 Years and Older, Female Preventive care refers to lifestyle choices and visits with your health care provider that can promote health and wellness. What does preventive care include? A yearly physical exam. This is also called an annual well check. Dental exams once or twice a year. Routine eye exams. Ask your health care provider how often you should have your eyes checked. Personal lifestyle choices, including: Daily care of your teeth and gums. Regular physical activity. Eating a healthy diet. Avoiding tobacco and drug use. Limiting alcohol use. Practicing safe sex. Taking low-dose aspirin every day. Taking vitamin and mineral supplements as recommended by your health care provider. What happens  during an annual well check? The services and screenings done by your health care provider during your annual well check will depend on your age, overall health, lifestyle risk factors, and family history of disease. Counseling  Your health care provider may ask you questions about your: Alcohol use. Tobacco use. Drug use. Emotional well-being. Home and relationship well-being. Sexual activity. Eating habits. History of falls. Memory and ability to understand (cognition). Work and work Statistician. Reproductive health. Screening  You may have the following tests or measurements: Height, weight, and BMI. Blood pressure. Lipid and cholesterol levels. These may be checked every 5 years, or more frequently if you are over 3 years old. Skin check. Lung cancer screening. You may have this screening every year starting at age 86 if you have a 30-pack-year history of smoking and currently smoke or have quit within the past 15 years. Fecal occult blood test (FOBT) of the stool. You may have this test every year starting at age 86. Flexible sigmoidoscopy or colonoscopy. You may have a sigmoidoscopy every 5 years or a colonoscopy every 10 years starting at age 86. Hepatitis C blood test. Hepatitis B blood test. Sexually transmitted disease (STD) testing. Diabetes screening. This is done by checking your blood sugar (glucose) after you have not eaten for a while (fasting). You may have this done every 1-3 years. Bone density scan. This is done to screen for osteoporosis. You may have this done starting at age 86. Mammogram. This may be done every 1-2 years. Talk to your health care provider about how often you should have regular mammograms. Talk with your health care provider about your test results, treatment options, and if necessary, the need for more tests.  Vaccines  Your health care provider may recommend certain vaccines, such as: Influenza vaccine. This is recommended every  year. Tetanus, diphtheria, and acellular pertussis (Tdap, Td) vaccine. You may need a Td booster every 10 years. Zoster vaccine. You may need this after age 60. Pneumococcal 13-valent conjugate (PCV13) vaccine. One dose is recommended after age 86. Pneumococcal polysaccharide (PPSV23) vaccine. One dose is recommended after age 86. Talk to your health care provider about which screenings and vaccines you need and how often you need them. This information is not intended to replace advice given to you by your health care provider. Make sure you discuss any questions you have with your health care provider. Document Released: 03/23/2015 Document Revised: 11/14/2015 Document Reviewed: 12/26/2014 Elsevier Interactive Patient Education  2017 [place] Prevention in the Home Falls can cause injuries. They can happen to people of all ages. There are many things you can do to make your home safe and to help prevent falls. What can I do on the outside of my home? Regularly fix the edges of walkways and driveways and fix any cracks. Remove anything that might make you trip as you walk through a door, such as a raised step or threshold. Trim any bushes or trees on the path to your home. Use bright outdoor lighting. Clear any walking paths of anything that might make someone trip, such as rocks or tools. Regularly check to see if handrails are loose or broken. Make sure that both sides of any steps have handrails. Any raised decks and porches should have guardrails on the edges. Have any leaves, snow, or ice cleared regularly. Use sand or salt on walking paths during winter. Clean up any spills in your garage right away. This includes oil or grease spills. What can I do in the bathroom? Use night lights. Install grab bars by the toilet and in the tub and shower. Do not use towel bars as grab bars. Use non-skid mats or decals in the tub or shower. If you need to sit down in the shower, use a  plastic, non-slip stool. Keep the floor dry. Clean up any water that spills on the floor as soon as it happens. Remove soap buildup in the tub or shower regularly. Attach bath mats securely with double-sided non-slip rug tape. Do not have throw rugs and other things on the floor that can make you trip. What can I do in the bedroom? Use night lights. Make sure that you have a light by your bed that is easy to reach. Do not use any sheets or blankets that are too big for your bed. They should not hang down onto the floor. Have a firm chair that has side arms. You can use this for support while you get dressed. Do not have throw rugs and other things on the floor that can make you trip. What can I do in the kitchen? Clean up any spills right away. Avoid walking on wet floors. Keep items that you use a lot in easy-to-reach places. If you need to reach something above you, use a strong step stool that has a grab bar. Keep electrical cords out of the way. Do not use floor polish or wax that makes floors slippery. If you must use wax, use non-skid floor wax. Do not have throw rugs and other things on the floor that can make you trip. What can I do with my stairs? Do not leave any items on the stairs. Make sure that  there are handrails on both sides of the stairs and use them. Fix handrails that are broken or loose. Make sure that handrails are as long as the stairways. Check any carpeting to make sure that it is firmly attached to the stairs. Fix any carpet that is loose or worn. Avoid having throw rugs at the top or bottom of the stairs. If you do have throw rugs, attach them to the floor with carpet tape. Make sure that you have a light switch at the top of the stairs and the bottom of the stairs. If you do not have them, ask someone to add them for you. What else can I do to help prevent falls? Wear shoes that: Do not have high heels. Have rubber bottoms. Are comfortable and fit you  well. Are closed at the toe. Do not wear sandals. If you use a stepladder: Make sure that it is fully opened. Do not climb a closed stepladder. Make sure that both sides of the stepladder are locked into place. Ask someone to hold it for you, if possible. Clearly mark and make sure that you can see: Any grab bars or handrails. First and last steps. Where the edge of each step is. Use tools that help you move around (mobility aids) if they are needed. These include: Canes. Walkers. Scooters. Crutches. Turn on the lights when you go into a dark area. Replace any light bulbs as soon as they burn out. Set up your furniture so you have a clear path. Avoid moving your furniture around. If any of your floors are uneven, fix them. If there are any pets around you, be aware of where they are. Review your medicines with your doctor. Some medicines can make you feel dizzy. This can increase your chance of falling. Ask your doctor what other things that you can do to help prevent falls. This information is not intended to replace advice given to you by your health care provider. Make sure you discuss any questions you have with your health care provider. Document Released: 12/21/2008 Document Revised: 08/02/2015 Document Reviewed: 03/31/2014 Elsevier Interactive Patient Education  2017 [place].   Exercises to do While Sitting Exercises that you do while sitting (chair exercises) can give you many of the same benefits as full exercise. Benefits include strengthening your heart, burning calories, and keeping muscles and joints healthy. Exercise can also improve your mood and help with depression and anxiety. You may benefit from chair exercises if you are unable to do standing exercises due to: Diabetic foot pain. Obesity. Illness. Arthritis. Recovery from surgery or injury. Breathing problems. Balance problems. Another type of disability. Before starting chair exercises, check with your  health care provider or a physical therapist to find out how much exercise you can tolerate and which exercises are safe for you. If your health care provider approves: Start out slowly and build up over time. Aim to work up to about 10-20 minutes for each exercise session. Make exercise part of your daily routine. Drink water when you exercise. Do not wait until you are thirsty. Drink every 10-15 minutes. Stop exercising right away if you have pain, nausea, shortness of breath, or dizziness. If you are exercising in a wheelchair, make sure to lock the wheels. Ask your health care provider whether you can do tai chi or yoga. Many positions in these mind-body exercises can be modified to do while seated. Warm-up Before starting other exercises: Sit up as straight as you can. Have your  knees bent at 90 degrees, which is the shape of the capital letter "L." Keep your feet flat on the floor. Sit at the front edge of your chair, if you can. Pull in (tighten) the muscles in your abdomen and stretch your spine and neck as straight as you can. Hold this position for a few minutes. Breathe in and out evenly. Try to concentrate on your breathing, and relax your mind. Stretching Exercise A: Arm stretch Hold your arms out straight in front of your body. Bend your hands at the wrist with your fingers pointing up, as if signaling someone to stop. Notice the slight tension in your forearms as you hold the position. Keeping your arms out and your hands bent, rotate your hands outward as far as you can and hold this stretch. Aim to have your thumbs pointing up and your pinkie fingers pointing down. Slowly repeat arm stretches for one minute as tolerated. Exercise B: Leg stretch If you can move your legs, try to "draw" letters on the floor with the toes of your foot. Write your name with one foot. Write your name with the toes of your other foot. Slowly repeat the movements for one minute as tolerated. Exercise  C: Reach for the sky Reach your hands as far over your head as you can to stretch your spine. Move your hands and arms as if you are climbing a rope. Slowly repeat the movements for one minute as tolerated. Range of motion exercises Exercise A: Shoulder roll Let your arms hang loosely at your sides. Lift just your shoulders up toward your ears, then let them relax back down. When your shoulders feel loose, rotate your shoulders in backward and forward circles. Do shoulder rolls slowly for one minute as tolerated. Exercise B: March in place As if you are marching, pump your arms and lift your legs up and down. Lift your knees as high as you can. If you are unable to lift your knees, just pump your arms and move your ankles and feet up and down. March in place for one minute as tolerated. Exercise C: Seated jumping jacks Let your arms hang down straight. Keeping your arms straight, lift them up over your head. Aim to point your fingers to the ceiling. While you lift your arms, straighten your legs and slide your heels along the floor to your sides, as wide as you can. As you bring your arms back down to your sides, slide your legs back together. If you are unable to use your legs, just move your arms. Slowly repeat seated jumping jacks for one minute as tolerated. Strengthening exercises Exercise A: Shoulder squeeze Hold your arms straight out from your body to your sides, with your elbows bent and your fists pointed at the ceiling. Keeping your arms in the bent position, move them forward so your elbows and forearms meet in front of your face. Open your arms back out as wide as you can with your elbows still bent, until you feel your shoulder blades squeezing together. Hold for 5 seconds. Slowly repeat the movements forward and backward for one minute as tolerated. Contact a health care provider if: You have to stop exercising due to any of the following: Pain. Nausea. Shortness of  breath. Dizziness. Fatigue. You have significant pain or soreness after exercising. Get help right away if: You have chest pain. You have difficulty breathing. These symptoms may represent a serious problem that is an emergency. Do not wait to see if  the symptoms will go away. Get medical help right away. Call your local emergency services (911 in the [place] Do not drive yourself to the hospital. Summary Exercises that you do while sitting (chair exercises) can strengthen your heart, burn calories, and keep muscles and joints healthy. You may benefit from chair exercises if you are unable to do standing exercises due to diabetic foot pain, obesity, recovery from surgery or injury, or other conditions. Before starting chair exercises, check with your health care provider or a physical therapist to find out how much exercise you can tolerate and which exercises are safe for you. This information is not intended to replace advice given to you by your health care provider. Make sure you discuss any questions you have with your health care provider. Document Revised: 04/22/2020 Document Reviewed: 04/22/2020 Elsevier Patient Education  2022 [employer].

## 2021-05-09 NOTE — Progress Notes (Signed)
Subjective:   [name] is a 88 y.o. female who presents for Medicare Annual (Subsequent) preventive examination.  Virtual Visit via Telephone Note  I connected with  [name] on 05/09/21 at  9:45 AM EST by telephone and verified that I am speaking with the correct person using two identifiers.  Location: Patient: Home Provider: [facility] Persons participating in the virtual visit: patient/Nurse Health Advisor   I discussed the limitations, risks, security and privacy concerns of performing an evaluation and management service by telephone and the availability of in person appointments. The patient expressed understanding and agreed to proceed.  Interactive audio and video telecommunications were attempted between this nurse and patient, however failed, due to patient having technical difficulties OR patient did not have access to video capability.  We continued and completed visit with audio only.  Some vital signs may be absent or patient reported.   [name] Sharon Isiaih Hollenbach, LPN   Review of Systems     Cardiac Risk Factors include: advanced age (>52men, >55 women);sedentary lifestyle;hypertension;Other (see comment), Risk factor comments: hx of TIA, atherosclerosis, mitral valve failure, pulmonary HTN     Objective:    Today's Vitals   05/09/21 0953  Weight: 103 lb (46.7 kg)   Body mass index is 18.25 kg/m.  Advanced Directives 05/09/2021 09/28/2020 05/08/2020 08/24/2018 06/01/2017 02/23/2017 01/12/2017  Does Patient Have a Medical Advance Directive? Yes Yes Yes No No No Yes  Type of Paramedic of [place];Living will Healthcare Power of [place];Living will - - - Living will  Does patient want to make changes to medical advance directive? - Yes (Inpatient - patient defers changing a medical advance directive and declines information at this time) No - Patient declined - - No - Patient declined No - Patient declined  Copy of Painter in Chart? No - copy requested No - copy requested No - copy requested - - - -  Would patient like information on creating a medical advance directive? - - - Yes (MAU/Ambulatory/Procedural Areas - Information given) No - Patient declined - -    Current Medications (verified) Outpatient Encounter Medications as of 05/09/2021  Medication Sig   apixaban (ELIQUIS) 2.5 MG TABS tablet Take 1 tablet (2.5 mg total) by mouth 2 (two) times daily.   atorvastatin (LIPITOR) 10 MG tablet Take 1 tablet (10 mg total) by mouth daily.   Biotin 1000 MCG tablet Take 1,000 mcg by mouth daily.   cetirizine (ZYRTEC) 5 MG tablet Take 1 tablet (5 mg total) by mouth daily.   Cholecalciferol (VITAMIN D3) 10000 UNITS capsule Take 10,000 Units by mouth daily.     furosemide (LASIX) 20 MG tablet Take 1 tablet (20 mg total) by mouth daily.   levofloxacin (LEVAQUIN) 500 MG tablet Take 1 tablet (500 mg total) by mouth daily.   metoprolol succinate (TOPROL-XL) 25 MG 24 hr tablet Take 1 tablet (25 mg total) by mouth daily.   losartan (COZAAR) 100 MG tablet Take 1 tablet (100 mg total) by mouth daily.   No facility-administered encounter medications on file as of 05/09/2021.    Allergies (verified) Donepezil, Actonel [risedronate sodium], Aspirin-caffeine, Codeine, Memantine, Penicillins, and Sulfonamide derivatives   History: Past Medical History:  Diagnosis Date   Arthritis    Atrial fibrillation ([place])    postoperative   COVID-19    CVA (cerebral vascular accident) ([place])    HTN (hypertension)    MR (mitral regurgitation)  severe   NSTEMI (non-ST elevated myocardial infarction) ([place])    Pericarditis    postoperative   Past Surgical History:  Procedure Laterality Date   ANTERIOR AND POSTERIOR VAGINAL REPAIR  10/15/04   Dr. Nori Riis    anterior repair with perigee graft  04/16/06   posterior repair with apogee graft; lynx mid urethral sling; sacrospinous ligamnet suspension and cystoscopy; Dr. Joya Martyr    MITRAL VALVE REPLACEMENT     sacral spinous ligament suspension of vagina     suprapubic cystectomy     TOTAL ABDOMINAL HYSTERECTOMY W/ BILATERAL SALPINGOOPHORECTOMY     Family History  Problem Relation Age of Onset   Heart disease Father    Lupus Father    Heart attack Father    Heart disease Mother    Colon cancer Brother    Heart disease Brother    Social History   Socioeconomic History   Marital status: Widowed    Spouse name: Not on file   Number of children: 3   Years of education: Not on file   Highest education level: 9th grade  Occupational History   Occupation: Retired  Tobacco Use   Smoking status: Never   Smokeless tobacco: Never  Vaping Use   Vaping Use: Never used  Substance and Sexual Activity   Alcohol use: No   Drug use: No   Sexual activity: Not on file  Other Topics Concern   Not on file  Social History Narrative   Lives with son -  2 daughters live nearby and visit frequently, they help with grocery shopping, etc   Right Handed    Drinks 2-3  cups caffeine daily   Social Determinants of Health   Financial Resource Strain: Not on file  Food Insecurity: Not on file  Transportation Needs: Not on file  Physical Activity: Insufficiently Active   Days of Exercise per Week: 7 days   Minutes of Exercise per Session: 10 min  Stress: Stress Concern Present   Feeling of Stress : To some extent  Social Connections: Moderately Integrated   Frequency of Communication with Friends and Family: More than three times a week   Frequency of Social Gatherings with Friends and Family: More than three times a week   Attends Religious Services: More than 4 times per year   Active Member of [employer] or Organizations: Yes   Attends Archivist Meetings: More than 4 times per year   Marital Status: Widowed    Tobacco Counseling Counseling given: Not Answered   Clinical Intake:  Pre-visit preparation completed: Yes  Pain : No/denies pain     BMI  - recorded: 18.25 Nutritional Status: BMI <19  Underweight Nutritional Risks: Unintentional weight loss (improving) Diabetes: No  How often do you need to have someone help you when you read instructions, pamphlets, or other written materials from your doctor or pharmacy?: 1 - Never  Diabetic?no  Interpreter Needed?: No  Information entered by :: Godson Pollan, LPN   Activities of Daily Living In your present state of health, do you have any difficulty performing the following activities: 05/09/2021 09/28/2020  Hearing? N N  Vision? N N  Difficulty concentrating or making decisions? Y N  Comment mild - improving -  Walking or climbing stairs? Y Y  Dressing or bathing? N N  Doing errands, shopping? Tempie Donning  Preparing Food and eating ? N -  Using the Toilet? N -  In the past six months, have you accidently leaked urine? N -  Do you have problems with loss of bowel control? N -  Managing your Medications? N -  Managing your Finances? Y -  Housekeeping or managing your Housekeeping? Y -  Some recent data might be hidden    Patient Care Team: [name], MD as PCP - General (Family Medicine) Josue Hector, MD as PCP - Cardiology (Cardiology) Ilean China, RN as Case Manager Josue Hector, MD as Consulting Physician (Cardiology) Garvin Fila, MD as Consulting Physician (Neurology) Rigoberto Noel, MD as Consulting Physician (Pulmonary Disease)  Indicate any recent Medical Services you may have received from other than [facility] providers in the past year (date may be approximate).     Assessment:   This is a routine wellness examination for Raima.  Hearing/Vision screen Hearing Screening - Comments:: Denies hearing difficulties   Vision Screening - Comments:: Wears rx glasses - up to date with routine eye exams with [facility] in [place] issues and exercise activities discussed: Current Exercise Habits: Home exercise routine, Type of exercise:  walking;stretching, Time (Minutes): 15, Frequency (Times/Week): 7, Weekly Exercise (Minutes/Week): 105, Intensity: Mild, Exercise limited by: cardiac condition(s)   Goals Addressed             This Visit's Progress    AWV   On track    05/08/2020 AWV Goal: Fall Prevention  Over the next year, patient will decrease their risk for falls by: Using assistive devices, such as a cane or walker, as needed Identifying fall risks within their home and correcting them by: Removing throw rugs Adding handrails to stairs or ramps Removing clutter and keeping a clear pathway throughout the home Increasing light, especially at night Adding shower handles/bars Raising toilet seat Identifying potential personal risk factors for falls: Medication side effects Incontinence/urgency Vestibular dysfunction Hearing loss Musculoskeletal disorders Neurological disorders Orthostatic hypotension       Exercise 3x per week (30 min per time)   Not on track    Try to exercise for at least 7mins, 3 times weekly       Depression Screen PHQ 2/9 Scores 05/09/2021 05/08/2021 02/04/2021 02/04/2021 10/31/2020 08/02/2020 06/12/2020  PHQ - 2 Score 0 0 0 0 0 0 0  PHQ- 9 Score 4 2 4  - 0 - -  Exception Documentation - - - - - - -  Not completed - - - - - - -    Fall Risk Fall Risk  05/09/2021 02/04/2021 02/04/2021 10/31/2020 08/02/2020  Falls in the past year? 0 1 0 0 1  Number falls in past yr: 0 0 - - 0  Injury with Fall? 0 0 - - 0  Comment - - - - -  Risk for fall due to : Impaired balance/gait;Orthopedic patient;Mental status change History of fall(s);Impaired balance/gait;Impaired mobility;Medication side effect - - Impaired balance/gait;History of fall(s)  Follow up Education provided;Falls prevention discussed Falls evaluation completed - - Falls evaluation completed    FALL RISK PREVENTION PERTAINING TO THE HOME:  Any stairs in or around the home? No  If so, are there any without handrails? No  Home free  of loose throw rugs in walkways, pet beds, electrical cords, etc? Yes  Adequate lighting in your home to reduce risk of falls? Yes   ASSISTIVE DEVICES UTILIZED TO PREVENT FALLS:  Life alert? Yes  Use of a cane, walker or w/c? Yes  Grab bars in the bathroom? Yes  Shower chair or bench in shower? Yes  Elevated toilet seat  or a handicapped toilet? Yes   TIMED UP AND GO:  Was the test performed? No . Telephonic visit  Cognitive Function:  MMSE - Mini Mental State Exam 07/09/2020  Orientation to time 5  Orientation to Place 5  Registration 3  Attention/ Calculation 0  Recall 2  Language- name 2 objects 2  Language- repeat 1  Language- follow 3 step command 3  Language- read & follow direction 1  Write a sentence 1  Copy design 1  Total score 24     6CIT Screen 05/09/2021 05/08/2020 08/24/2018  What Year? 0 points 0 points 0 points  What month? 0 points 0 points 0 points  What time? 0 points 0 points 0 points  Count back from 20 0 points 0 points 0 points  Months in reverse 2 points 0 points 0 points  Repeat phrase 2 points 0 points 0 points  Total Score 4 0 0    Immunizations Immunization History  Administered Date(s) Administered   Fluad Quad(high Dose 65+) 12/13/2018, 01/31/2020, 01/15/2021   Influenza, High Dose Seasonal PF 01/22/2016, 01/06/2017, 02/09/2018   Influenza,inj,Quad PF,6+ Mos 12/27/2012, 12/06/2013, 03/16/2015   Influenza-Unspecified 01/02/2009, 01/18/2010, 02/14/2011   Moderna Sars-Covid-2 Vaccination 05/11/2019, 06/08/2019, 03/09/2020   Pneumococcal Conjugate-13 12/06/2013   Pneumococcal Polysaccharide-23 04/05/2019   Td 06/15/2017    TDAP status: Up to date  Flu Vaccine status: Up to date  Pneumococcal vaccine status: Up to date  Covid-19 vaccine status: Completed vaccines  Qualifies for Shingles Vaccine? Yes   Zostavax completed No   Shingrix Completed?: No.    Education has been provided regarding the importance of this vaccine. Patient has  been advised to call insurance company to determine out of pocket expense if they have not yet received this vaccine. Advised may also receive vaccine at local pharmacy or Health Dept. Verbalized acceptance and understanding.  Screening Tests Health Maintenance  Topic Date Due   COVID-19 Vaccine (4 - Booster for Moderna series) 05/04/2020   Zoster Vaccines- Shingrix (1 of 2) 08/02/2021 (Originally 05/21/1947)   TETANUS/TDAP  06/16/2027   Pneumonia Vaccine 13+ Years old  Completed   INFLUENZA VACCINE  Completed   DEXA SCAN  Completed   HPV VACCINES  Aged Out    Health Maintenance  Health Maintenance Due  Topic Date Due   COVID-19 Vaccine (4 - Booster for Moderna series) 05/04/2020    Colorectal cancer screening: No longer required.   Mammogram status: No longer required due to age.  Bone Density status: Completed 04/05/2019. Results reflect: Bone density results: OSTEOPOROSIS. Repeat every 2 years.  Lung Cancer Screening: (Low Dose CT Chest recommended if Age 51-80 years, 30 pack-year currently smoking OR have quit w/in 15years.) does not qualify.   Additional Screening:  Hepatitis C Screening: does not qualify  Vision Screening: Recommended annual ophthalmology exams for early detection of glaucoma and other disorders of the eye. Is the patient up to date with their annual eye exam?  Yes  Who is the provider or what is the name of the office in which the patient attends annual eye exams? [place] If pt is not established with a provider, would they like to be referred to a provider to establish care? No .   Dental Screening: Recommended annual dental exams for proper oral hygiene  Community Resource Referral / Chronic Care Management: CRR required this visit?  No   CCM required this visit?  No      Plan:  I have personally reviewed and noted the following in the patients chart:   Medical and social history Use of alcohol, tobacco or illicit drugs   Current medications and supplements including opioid prescriptions.  Functional ability and status Nutritional status Physical activity Advanced directives List of other physicians Hospitalizations, surgeries, and ER visits in previous 12 months Vitals Screenings to include cognitive, depression, and falls Referrals and appointments  In addition, I have reviewed and discussed with patient certain preventive protocols, quality metrics, and best practice recommendations. A written personalized care plan for preventive services as well as general preventive health recommendations were provided to patient.     Sandrea Hammond, LPN   08/12/9976   Nurse Notes: None

## 2021-05-15 ENCOUNTER — Ambulatory Visit: Payer: Medicare HMO | Admitting: Pulmonary Disease

## 2021-05-15 ENCOUNTER — Encounter: Payer: Self-pay | Admitting: Pulmonary Disease

## 2021-05-15 ENCOUNTER — Other Ambulatory Visit: Payer: Self-pay

## 2021-05-15 DIAGNOSIS — J189 Pneumonia, unspecified organism: Secondary | ICD-10-CM

## 2021-05-15 DIAGNOSIS — I7121 Aneurysm of the ascending aorta, without rupture: Secondary | ICD-10-CM | POA: Insufficient documentation

## 2021-05-15 NOTE — Progress Notes (Signed)
? ?  Subjective:  ? ? Patient ID: [name], female    DOB: 29-Oct-1928, 88 y.o.   MRN: [id number] ? ?HPI ? ?88 year old never smoker evaluated 02/2021 for multifocal pneumonia ? ?PMH - post MV repair  ?- hypertension ?- memory issues ,on namenda and aricept ?-Hospitalized 09/27/20 with COVID causing afib and new DCM with TTE showing EF 30-35% MV repair ok, converted with amio ? ? Chest x-ray 12/7 showed multifocal airspace disease in the right lung and periphery of left lower lobe with small right effusion ?We treated her with Levaquin. ?CT chest without contrast 02/2021 showed focal consolidation peripheral right upper lobe ? ?Follow-up CT chest 04/17/2021 showed persistent but improving multifocal infiltrates in the lungs and a small pleural effusion, bronchiectasis was noted in the right middle lobe ? ?She states is significantly improved, cough is resolved, no dyspnea or wheezing ? ?Review of Systems ?neg for any significant sore throat, dysphagia, itching, sneezing, nasal congestion or excess/ purulent secretions, fever, chills, sweats, unintended wt loss, pleuritic or exertional cp, hempoptysis, orthopnea pnd or change in chronic leg swelling. Also denies presyncope, palpitations, heartburn, abdominal pain, nausea, vomiting, diarrhea or change in bowel or urinary habits, dysuria,hematuria, rash, arthralgias, visual complaints, headache, numbness weakness or ataxia. ? ?   ?Objective:  ? Physical Exam ? ?Gen. Pleasant, elderly, thin, in no distress ?ENT - no thrush, no pallor/icterus,no post nasal drip ?Neck: No JVD, no thyromegaly, no carotid bruits ?Lungs: no use of accessory muscles, no dullness to percussion, clear without rales or rhonchi  ?Cardiovascular: Rhythm regular, heart sounds  normal, no murmurs or gallops, no peripheral edema ?Musculoskeletal: No deformities, no cyanosis or clubbing  ? ? ? ? ?   ?Assessment & Plan:  ? ? ?

## 2021-05-15 NOTE — Assessment & Plan Note (Signed)
Clinically resolved and radiologically resolving on previous CT scan, does not need further imaging follow-up ?

## 2021-05-15 NOTE — Assessment & Plan Note (Signed)
4 cm aneurysm incidentally noted on CT imaging. ?Given her advanced age no further follow-up necessary but will defer to cardiologist ?

## 2021-05-15 NOTE — Patient Instructions (Signed)
Pneumonia has healed well ?

## 2021-05-30 DIAGNOSIS — C4442 Squamous cell carcinoma of skin of scalp and neck: Secondary | ICD-10-CM | POA: Diagnosis not present

## 2021-05-30 DIAGNOSIS — Z08 Encounter for follow-up examination after completed treatment for malignant neoplasm: Secondary | ICD-10-CM | POA: Diagnosis not present

## 2021-05-30 DIAGNOSIS — Z85828 Personal history of other malignant neoplasm of skin: Secondary | ICD-10-CM | POA: Diagnosis not present

## 2021-05-30 DIAGNOSIS — L298 Other pruritus: Secondary | ICD-10-CM | POA: Diagnosis not present

## 2021-05-30 DIAGNOSIS — D485 Neoplasm of uncertain behavior of skin: Secondary | ICD-10-CM | POA: Diagnosis not present

## 2021-05-30 DIAGNOSIS — L538 Other specified erythematous conditions: Secondary | ICD-10-CM | POA: Diagnosis not present

## 2021-05-30 DIAGNOSIS — L570 Actinic keratosis: Secondary | ICD-10-CM | POA: Diagnosis not present

## 2021-05-30 DIAGNOSIS — L820 Inflamed seborrheic keratosis: Secondary | ICD-10-CM | POA: Diagnosis not present

## 2021-07-02 DIAGNOSIS — D044 Carcinoma in situ of skin of scalp and neck: Secondary | ICD-10-CM | POA: Diagnosis not present

## 2021-07-02 DIAGNOSIS — L570 Actinic keratosis: Secondary | ICD-10-CM | POA: Diagnosis not present

## 2021-07-16 ENCOUNTER — Other Ambulatory Visit: Payer: Self-pay | Admitting: Cardiovascular Disease

## 2021-08-08 ENCOUNTER — Encounter: Payer: Self-pay | Admitting: Family Medicine

## 2021-08-08 ENCOUNTER — Ambulatory Visit (INDEPENDENT_AMBULATORY_CARE_PROVIDER_SITE_OTHER): Payer: Medicare HMO | Admitting: Family Medicine

## 2021-08-08 VITALS — BP 164/77 | HR 68 | Temp 97.2°F | Ht 63.5 in | Wt 108.4 lb

## 2021-08-08 DIAGNOSIS — I10 Essential (primary) hypertension: Secondary | ICD-10-CM | POA: Diagnosis not present

## 2021-08-08 DIAGNOSIS — E782 Mixed hyperlipidemia: Secondary | ICD-10-CM | POA: Diagnosis not present

## 2021-08-08 DIAGNOSIS — I480 Paroxysmal atrial fibrillation: Secondary | ICD-10-CM | POA: Diagnosis not present

## 2021-08-08 MED ORDER — OLMESARTAN MEDOXOMIL 40 MG PO TABS: 40.0000 mg | ORAL_TABLET | Freq: Every day | ORAL | 1 refills | Status: DC

## 2021-08-08 NOTE — Progress Notes (Signed)
Subjective:  Patient ID: [name], female    DOB: 01/08/1929  Age: 88 y.o. MRN: [id number]  CC: Medical Management of Chronic Issues   HPI [name] presents for  follow-up of hypertension. Patient has no history of headache chest pain or shortness of breath or recent cough. Patient also denies symptoms of TIA such as focal numbness or weakness. Patient denies side effects from medication. States taking it regularly.  Atrial fibrillation follow up. Pt. is treated with rate control and anticoagulation. Pt.  denies palpitations, rapid rate, chest pain, dyspnea and edema. There has been no bleeding from nose or gums. Pt. has not noticed blood with urine or stool.  Although there is routine bruising easily, it is not excessive.    History [name] has a past medical history of Arthritis, Atrial fibrillation (HCC), COVID-19, CVA (cerebral vascular accident) (HCC), HTN (hypertension), MR (mitral regurgitation), NSTEMI (non-ST elevated myocardial infarction) (HCC), Orthostatic hypotension (11/06/2011), and Pericarditis.   She has a past surgical history that includes anterior repair with perigee graft (04/16/06); Anterior and posterior vaginal repair (10/15/04); sacral spinous ligament suspension of vagina; suprapubic cystectomy; Total abdominal hysterectomy w/ bilateral salpingoophorectomy; and Mitral valve replacement.   Her family history includes Colon cancer in her brother; Heart attack in her father; Heart disease in her brother, father, and mother; Lupus in her father.She reports that she has never smoked. She has never used smokeless tobacco. She reports that she does not drink alcohol and does not use drugs.  Current Outpatient Medications on File Prior to Visit  Medication Sig Dispense Refill   apixaban (ELIQUIS) 2.5 MG TABS tablet Take 1 tablet (2.5 mg total) by mouth 2 (two) times daily. 180 tablet 3   atorvastatin (LIPITOR) 10 MG tablet Take 1 tablet (10 mg total) by mouth daily. Needs  appointment for further refills. [phone] 90 tablet 1   Biotin 1000 MCG tablet Take 1,000 mcg by mouth daily.     cetirizine (ZYRTEC) 5 MG tablet Take 1 tablet (5 mg total) by mouth daily. 30 tablet 1   Cholecalciferol (VITAMIN D3) 10000 UNITS capsule Take 10,000 Units by mouth daily.       fluorouracil (EFUDEX) 5 % cream      metoprolol succinate (TOPROL-XL) 25 MG 24 hr tablet Take 1 tablet (25 mg total) by mouth daily. 90 tablet 3   No current facility-administered medications on file prior to visit.    ROS Review of Systems  Constitutional: Negative.   HENT: Negative.    Eyes:  Negative for visual disturbance.  Respiratory:  Negative for shortness of breath.   Cardiovascular:  Negative for chest pain.  Gastrointestinal:  Negative for abdominal pain.  Musculoskeletal:  Negative for arthralgias.   Objective:  BP (!) 164/77   Pulse 68   Temp (!) 97.2 F (36.2 C)   Ht 5' 3.5" (1.613 m)   Wt 108 lb 6.4 oz (49.2 kg)   SpO2 100%   BMI 18.90 kg/m   BP Readings from Last 3 Encounters:  08/08/21 (!) 164/77  05/15/21 (!) 142/80  05/08/21 (!) 171/72    Wt Readings from Last 3 Encounters:  08/08/21 108 lb 6.4 oz (49.2 kg)  05/15/21 104 lb 3.2 oz (47.3 kg)  05/09/21 103 lb (46.7 kg)     Physical Exam Constitutional:      General: She is not in acute distress.    Appearance: She is well-developed.  Cardiovascular:     Rate and Rhythm: Normal rate  and regular rhythm.  Pulmonary:     Breath sounds: Normal breath sounds.  Musculoskeletal:        General: Normal range of motion.  Skin:    General: Skin is warm and dry.  Neurological:     Mental Status: She is alert and oriented to person, place, and time.      Assessment & Plan:   [name] was seen today for medical management of chronic issues.  Diagnoses and all orders for this visit:  Mixed hyperlipidemia -     Lipid panel  Essential hypertension -     CBC with Differential/Platelet -      CMP14+EGFR  Paroxysmal atrial fibrillation (HCC)  Other orders -     olmesartan (BENICAR) 40 MG tablet; Take 1 tablet (40 mg total) by mouth daily. For blood pressure   Allergies as of 08/08/2021       Reactions   Donepezil Other (See Comments)   bradycardia   Actonel [risedronate Sodium] Nausea And Vomiting   Aspirin-caffeine    Upset stomach   Codeine    REACTION: upset stomach   Memantine    Penicillins    Sulfonamide Derivatives    REACTION: swollen tongue        Medication List        Accurate as of August 08, 2021  4:59 PM. If you have any questions, ask your nurse or doctor.          STOP taking these medications    furosemide 20 MG tablet Commonly known as: Lasix Stopped by: [name], MD   levofloxacin 500 MG tablet Commonly known as: LEVAQUIN Stopped by: [name], MD   losartan 100 MG tablet Commonly known as: COZAAR Stopped by: [name], MD       TAKE these medications    apixaban 2.5 MG Tabs tablet Commonly known as: ELIQUIS Take 1 tablet (2.5 mg total) by mouth 2 (two) times daily.   atorvastatin 10 MG tablet Commonly known as: LIPITOR Take 1 tablet (10 mg total) by mouth daily. Needs appointment for further refills. [phone]   Biotin 1000 MCG tablet Take 1,000 mcg by mouth daily.   cetirizine 5 MG tablet Commonly known as: ZYRTEC Take 1 tablet (5 mg total) by mouth daily.   fluorouracil 5 % cream Commonly known as: EFUDEX   metoprolol succinate 25 MG 24 hr tablet Commonly known as: TOPROL-XL Take 1 tablet (25 mg total) by mouth daily.   olmesartan 40 MG tablet Commonly known as: Benicar Take 1 tablet (40 mg total) by mouth daily. For blood pressure Started by: [name], MD   Vitamin D3 250 MCG (10000 UT) capsule Take 10,000 Units by mouth daily.        Meds ordered this encounter  Medications   olmesartan (BENICAR) 40 MG tablet    Sig: Take 1 tablet (40 mg total) by mouth daily. For blood  pressure    Dispense:  90 tablet    Refill:  1      Follow-up: Return in about 3 months (around 11/08/2021) for hypertension.  [name], M.D.

## 2021-08-09 LAB — CBC WITH DIFFERENTIAL/PLATELET
Basophils Absolute: 0 10*3/uL (ref 0.0–0.2)
Basos: 1 %
EOS (ABSOLUTE): 0.1 10*3/uL (ref 0.0–0.4)
Eos: 3 %
Hematocrit: 38.8 % (ref 34.0–46.6)
Hemoglobin: 13.1 g/dL (ref 11.1–15.9)
Immature Grans (Abs): 0 10*3/uL (ref 0.0–0.1)
Immature Granulocytes: 0 %
Lymphocytes Absolute: 1.6 10*3/uL (ref 0.7–3.1)
Lymphs: 32 %
MCH: 31.7 pg (ref 26.6–33.0)
MCHC: 33.8 g/dL (ref 31.5–35.7)
MCV: 94 fL (ref 79–97)
Monocytes Absolute: 0.4 10*3/uL (ref 0.1–0.9)
Monocytes: 8 %
Neutrophils Absolute: 3 10*3/uL (ref 1.4–7.0)
Neutrophils: 56 %
Platelets: 195 10*3/uL (ref 150–450)
RBC: 4.13 x10E6/uL (ref 3.77–5.28)
RDW: 13 % (ref 11.7–15.4)
WBC: 5.2 10*3/uL (ref 3.4–10.8)

## 2021-08-09 LAB — CMP14+EGFR
ALT: 19 IU/L (ref 0–32)
AST: 25 IU/L (ref 0–40)
Albumin/Globulin Ratio: 1.6 (ref 1.2–2.2)
Albumin: 4.4 g/dL (ref 3.5–4.6)
Alkaline Phosphatase: 66 IU/L (ref 44–121)
BUN/Creatinine Ratio: 22 (ref 12–28)
BUN: 20 mg/dL (ref 10–36)
Bilirubin Total: 0.8 mg/dL (ref 0.0–1.2)
CO2: 25 mmol/L (ref 20–29)
Calcium: 10 mg/dL (ref 8.7–10.3)
Chloride: 103 mmol/L (ref 96–106)
Creatinine, Ser: 0.93 mg/dL (ref 0.57–1.00)
Globulin, Total: 2.7 g/dL (ref 1.5–4.5)
Glucose: 88 mg/dL (ref 70–99)
Potassium: 4.2 mmol/L (ref 3.5–5.2)
Sodium: 142 mmol/L (ref 134–144)
Total Protein: 7.1 g/dL (ref 6.0–8.5)
eGFR: 58 mL/min/{1.73_m2} — ABNORMAL LOW (ref >59)

## 2021-08-09 LAB — LIPID PANEL
Chol/HDL Ratio: 2.1 ratio (ref 0.0–4.4)
Cholesterol, Total: 164 mg/dL (ref 100–199)
HDL: 79 mg/dL (ref >39)
LDL Chol Calc (NIH): 71 mg/dL (ref 0–99)
Triglycerides: 77 mg/dL (ref 0–149)
VLDL Cholesterol Cal: 14 mg/dL (ref 5–40)

## 2021-08-19 ENCOUNTER — Encounter: Payer: Self-pay | Admitting: *Deleted

## 2021-09-30 ENCOUNTER — Ambulatory Visit: Payer: Self-pay | Admitting: *Deleted

## 2021-09-30 DIAGNOSIS — I10 Essential (primary) hypertension: Secondary | ICD-10-CM

## 2021-09-30 NOTE — Patient Instructions (Signed)
[name]  I have previously worked with you through the Chronic Care Management Program at [facility]. Due to program changes I am removing myself from your care team because you've either met our goals, your conditions are stable and no longer require care management, or we haven't engaged within the past 6 months. If you are currently active with another CCM Team Member, you will remain active with them unless they reach out to you with additional information. If you feel that you need RN Care Management services in the future, please talk with your primary care provider to discuss re-engagement with the RN Care Manager that will be assigned to [facility]. This does not affect your status as a patient at [facility].   Thank you for allowing me to participate in your your healthcare journey.  Chong Sicilian, BSN, RN-BC Embedded Chronic Care Manager [facility] Management Direct Dial: [phone]

## 2021-09-30 NOTE — Chronic Care Management (AMB) (Signed)
  Chronic Care Management   Note  09/30/2021 Name: [name] MRN: [id number] DOB: June 17, 1928   Patient has either met RN Care Management goals, is stable from [place] Management perspective, or has not recently engaged with the RN Care Manager. I am removing RN Care Manager from Care Team and closing [place]. If patient is currently engaged with another CCM team member I will forward this encounter to inform them of my case closure. Patient may be eligible for re-engagement with RN Care Manager in the future if necessary and can discuss this with their PCP.  Chong Sicilian, BSN, RN-BC Embedded Chronic Care Manager [facility] [place] Management Direct Dial: [phone]

## 2021-11-12 ENCOUNTER — Ambulatory Visit: Payer: Medicare HMO | Admitting: Family Medicine

## 2021-11-25 ENCOUNTER — Telehealth: Payer: Self-pay | Admitting: Cardiovascular Disease

## 2021-11-25 NOTE — Telephone Encounter (Signed)
Left message for patient to call back  

## 2021-11-25 NOTE — Telephone Encounter (Signed)
Pt c/o Shortness Of Breath: STAT if SOB developed within the last 24 hours or pt is noticeably SOB on the phone  1. Are you currently SOB (can you hear that pt is SOB on the phone)? no  2. How long have you been experiencing SOB? Since Wednesday of last week  3. Are you SOB when sitting or when up moving around? Moving around  4. Are you currently experiencing any other symptoms? States her heart rate goes up when she gets out of breath, and is not sure what the rate is   Patient states she has been SOB when she walks around and has to stop and rest. She says she will be gone to pick up her car and to call her back after 11am today.

## 2021-11-25 NOTE — Telephone Encounter (Signed)
Patient's daughter calling back. 

## 2021-11-25 NOTE — Telephone Encounter (Signed)
Call sent to triage. Patient's daughter calling about patient's SOB with light activity and elevated HR. She stated patient has been having symptoms since last week. Patient's daughter did not have any BP or HR readings.  Made patient an appointment with Dr. Johnsie Cancel on his DOD day.

## 2021-11-26 NOTE — Progress Notes (Signed)
Patient ID: [name], female   DOB: 12-22-1928, 88 y.o.   MRN: [id number]      88 y.o. f/u post MV repair TTE 01/12/19 showed trace MR with low diastolic gradients and normal EF 60%.  She has HTN on ARB More dizziness on 50 mg Cozaar dose decreased. She thinks statin Makes her feel worse and now taking Qod.  Some LE varicosities   Depression continues. Husband [name] was a patient of mine and died of lung cancer 6 years ago They were married 52 years. Shortly after that [name] died tragically in there back yard of a chain saw accident. Son [name] living with her and two daughters near by.   Has had some memory issues and has been on namenda and aricept  Hospitalized 09/27/20 with COVID causing afib and new DCM with TTE showing EF 30-35% MV repair fine with trivial MR no MS mean gradient 4 mmhg  Converted with amiodarone to slow sinus and aricept d/c Added Toprol, ARB and eliquis 2.5 bid  TTE 01/10/21 EF improved 35-40%   December 2022 more dyspnea Primary ? Sinusitis and started her on Augmentin Cough with sputum rhiorrhea and sore throat with cough and confusion from Namenda/Aricept   Called office again 11/25/20 complaining of dyspnea and more rapid HR;s  She had CT 04/17/21 showing bronchiectasis with mucous plugging in RML and multifocal infiltrates Seen by pulmonary Dr Elsworth Soho 05/15/21 thought to be improving and no further imaging needed   She is on low dose eliquis and Toprol for afib She is on Benicar for HTN  Office today in rapid fib/flutter She clearly gets dyspnea and fatigue with minimum walking Has not missed any DOAC Spoke with daughter [name] on phone and one of her daughters was with her today  ROS: Denies fever, malais, weight loss, blurry vision, decreased visual acuity, cough, sputum, SOB, hemoptysis, pleuritic pain, palpitaitons, heartburn, abdominal pain, melena, lower extremity edema, claudication, or rash.  All other systems reviewed and negative  BP 108/84   Pulse (!) 122    Ht 5' 3.5" (1.613 m)   Wt 111 lb 9.6 oz (50.6 kg)   SpO2 95%   BMI 19.46 kg/m   Affect appropriate Healthy:  appears stated age 25: normal Neck supple with no adenopathy JVP normal no bruits no thyromegaly Lungs clear with no wheezing and good diaphragmatic motion Heart:  S1/S2 no murmur, no rub, gallop or click PMI normal  Post sternotomy  Abdomen: benighn, BS positve, no tenderness, no AAA no bruit.  No HSM or HJR Distal pulses intact with no bruits No edema Neuro non-focal Skin warm and dry No muscular weakness   Current Outpatient Medications  Medication Sig Dispense Refill   amoxicillin (AMOXIL) 500 MG tablet Take 2,000 mg by mouth as directed. Take 4 tablets 1 hour prior to dental procedures and cleanings     apixaban (ELIQUIS) 2.5 MG TABS tablet Take 1 tablet (2.5 mg total) by mouth 2 (two) times daily. 180 tablet 3   atorvastatin (LIPITOR) 10 MG tablet Take 1 tablet (10 mg total) by mouth daily. Needs appointment for further refills. [phone] 90 tablet 1   Biotin 1000 MCG tablet Take 1,000 mcg by mouth daily.     cetirizine (ZYRTEC) 5 MG tablet Take 1 tablet (5 mg total) by mouth daily. 30 tablet 1   Cholecalciferol (VITAMIN D3) 10000 UNITS capsule Take 10,000 Units by mouth daily.       fluorouracil (EFUDEX) 5 % cream  metoprolol succinate (TOPROL-XL) 25 MG 24 hr tablet Take 1 tablet (25 mg total) by mouth daily. 90 tablet 3   olmesartan (BENICAR) 40 MG tablet Take 1 tablet (40 mg total) by mouth daily. For blood pressure 90 tablet 1   No current facility-administered medications for this visit.    Allergies  Donepezil, Actonel [risedronate sodium], Aspirin-caffeine, Codeine, Memantine, Penicillins, and Sulfonamide derivatives  Electrocardiogram:   12/02/17  SR rate 62 LAD RBBB 11/28/2021 SR rate 58 RBBB/LAD  11/28/2021 afib/flutter rate 122 RBBB LAD   Assessment and Plan  MV Repair:  SBE prophylaxis discussed  Echo 01/10/21 repair intact no  significant MR update echo with dyspnea   HTN: Well controlled.  Continue current medications and low sodium Dash type diet.    Thyroid:  On replacement TSH 3.14 September 2019 normal   PAF: On Toprol and eliquis Fatigue and dyspnea likely from rapid rate Start amiodarone 200 bid for a week then 200 mg daily consider [place] if she does not convert F/U EP Afib clinic   DCM:  09/27/20 EF 30-35% in setting of afib/Covid. TTE done 01/10/21 improved to 35-40% Continue beta blocker and ARB She is not volume overloaded Check BNP and update echo   Memory: Aricept d/c in hospital due to bradycardia Sent Dr Livia Snellen a message never to restart and she appears to be having side effects from [place] also d/c   Pulmonary:  Frequent URI CT bronchiectasis ? MAI will update non contrast CT  Amiodarone 200 bid for a week then 200 mg daily  Non contrast CT   F/U EP and me   [name]

## 2021-11-28 ENCOUNTER — Ambulatory Visit: Payer: Medicare HMO | Attending: Cardiovascular Disease | Admitting: Cardiovascular Disease

## 2021-11-28 ENCOUNTER — Encounter: Payer: Self-pay | Admitting: Cardiovascular Disease

## 2021-11-28 VITALS — BP 108/84 | HR 122 | Ht 63.5 in | Wt 111.6 lb

## 2021-11-28 DIAGNOSIS — I4891 Unspecified atrial fibrillation: Secondary | ICD-10-CM | POA: Diagnosis not present

## 2021-11-28 DIAGNOSIS — Z9889 Other specified postprocedural states: Secondary | ICD-10-CM | POA: Diagnosis not present

## 2021-11-28 DIAGNOSIS — J479 Bronchiectasis, uncomplicated: Secondary | ICD-10-CM | POA: Diagnosis not present

## 2021-11-28 DIAGNOSIS — R0609 Other forms of dyspnea: Secondary | ICD-10-CM | POA: Diagnosis not present

## 2021-11-28 MED ORDER — AMIODARONE HCL 200 MG PO TABS: ORAL_TABLET | ORAL | 3 refills | Status: DC

## 2021-11-28 NOTE — Patient Instructions (Addendum)
Medication Instructions:  Your physician has recommended you make the following change in your medication:  1-START Amiodarone 200 mg by mouth twice daily for one week, then Amiodarone 200 mg by mouth daily.  *If you need a refill on your cardiac medications before your next appointment, please call your pharmacy  Lab Work: If you have labs (blood work) drawn today and your tests are completely normal, you will receive your results only by: [place] (if you have MyChart) OR A paper copy in the mail If you have any lab test that is abnormal or we need to change your treatment, we will call you to review the results.  Testing/Procedures: Non contract chest CT scanning, (CAT scanning), is a noninvasive, special x-ray that produces cross-sectional images of the body using x-rays and a computer. CT scans help physicians diagnose and treat medical conditions. For some CT exams, a contrast material is used to enhance visibility in the area of the body being studied. CT scans provide greater clarity and reveal more details than regular x-ray exams.  Follow-Up: At [facility], you and your health needs are our priority.  As part of our continuing mission to provide you with exceptional heart care, we have created designated Provider Care Teams.  These Care Teams include your primary Cardiologist (physician) and Advanced Practice Providers (APPs -  Physician Assistants and Nurse Practitioners) who all work together to provide you with the care you need, when you need it.  We recommend signing up for the patient portal called "MyChart".  Sign up information is provided on this After Visit Summary.  MyChart is used to connect with patients for Virtual Visits (Telemedicine).  Patients are able to view lab/test results, encounter notes, upcoming appointments, etc.  Non-urgent messages can be sent to your provider as well.   To learn more about what you can do with MyChart, go to  [email or web address].    Your next appointment:   3 month(s)  The format for your next appointment:   In Person  Provider:   [name], MD     You have been referred to Atrial Fib. Clinic in 2 weeks.    Important Information About Sugar

## 2021-12-11 ENCOUNTER — Ambulatory Visit (HOSPITAL_COMMUNITY)
Admission: RE | Admit: 2021-12-11 | Discharge: 2021-12-11 | Disposition: A | Discharge status: Home / Self Care | Payer: Medicare HMO | Source: Ambulatory Visit | Attending: Cardiovascular Disease | Admitting: Cardiovascular Disease

## 2021-12-11 DIAGNOSIS — R911 Solitary pulmonary nodule: Secondary | ICD-10-CM | POA: Diagnosis not present

## 2021-12-11 DIAGNOSIS — J90 Pleural effusion, not elsewhere classified: Secondary | ICD-10-CM | POA: Diagnosis not present

## 2021-12-11 DIAGNOSIS — J479 Bronchiectasis, uncomplicated: Secondary | ICD-10-CM | POA: Diagnosis not present

## 2021-12-17 ENCOUNTER — Ambulatory Visit (HOSPITAL_COMMUNITY)
Admission: RE | Admit: 2021-12-17 | Discharge: 2021-12-17 | Disposition: A | Discharge status: Home / Self Care | Payer: Medicare HMO | Source: Ambulatory Visit | Attending: Physician Assistant | Admitting: Physician Assistant

## 2021-12-17 ENCOUNTER — Encounter (HOSPITAL_COMMUNITY): Payer: Self-pay | Admitting: Nurse Practitioner

## 2021-12-17 VITALS — BP 158/68 | HR 65 | Ht 63.5 in | Wt 106.6 lb

## 2021-12-17 DIAGNOSIS — I10 Essential (primary) hypertension: Secondary | ICD-10-CM | POA: Insufficient documentation

## 2021-12-17 DIAGNOSIS — I4510 Unspecified right bundle-branch block: Secondary | ICD-10-CM | POA: Insufficient documentation

## 2021-12-17 DIAGNOSIS — D6869 Other thrombophilia: Secondary | ICD-10-CM

## 2021-12-17 DIAGNOSIS — Z79899 Other long term (current) drug therapy: Secondary | ICD-10-CM | POA: Diagnosis not present

## 2021-12-17 DIAGNOSIS — Z7901 Long term (current) use of anticoagulants: Secondary | ICD-10-CM | POA: Diagnosis not present

## 2021-12-17 DIAGNOSIS — I480 Paroxysmal atrial fibrillation: Secondary | ICD-10-CM | POA: Diagnosis not present

## 2021-12-17 DIAGNOSIS — I4891 Unspecified atrial fibrillation: Secondary | ICD-10-CM

## 2021-12-17 NOTE — Progress Notes (Signed)
Primary Care Physician: [name], MD Referring Physician: Dr. Teena Irani [name] is a 88 y.o. female with a h/o  f/u post MV repair TTE 01/12/19 showed trace MR with low diastolic gradients and normal EF 60%, HTN.Hospitalized 09/27/20 with COVID causing afib and new DCM with TTE showing EF 30-35% MV repair fine with trivial MR no MS mean gradient 4 mmhg  Converted with amiodarone to slow sinus and aricept d/c. Added Toprol, ARB and eliquis 2.5 bid.  She is now in the afib clinic as she  recently saw Dr. Johnsie Cancel 11/28/21 and was back in afib with RVR. She was started on amiodarone 200 mg bid x one week and is now on amiodarone 200 mg daily. She is in SR and feels she went back into SR last yesterday. She feels improved and is breathing much better in SR.   Today, she denies symptoms of palpitations, chest pain, shortness of breath, orthopnea, PND, lower extremity edema, dizziness, presyncope, syncope, or neurologic sequela. The patient is tolerating medications without difficulties and is otherwise without complaint today.   Past Medical History:  Diagnosis Date   Arthritis    Atrial fibrillation ([place])    postoperative   COVID-19    CVA (cerebral vascular accident) ([place])    HTN (hypertension)    MR (mitral regurgitation)    severe   NSTEMI (non-ST elevated myocardial infarction) ([place])    Orthostatic hypotension 11/06/2011   Pericarditis    postoperative   Past Surgical History:  Procedure Laterality Date   ANTERIOR AND POSTERIOR VAGINAL REPAIR  10/15/04   Dr. Nori Riis    anterior repair with perigee graft  04/16/06   posterior repair with apogee graft; lynx mid urethral sling; sacrospinous ligamnet suspension and cystoscopy; Dr. Joya Martyr   MITRAL VALVE REPLACEMENT     sacral spinous ligament suspension of vagina     suprapubic cystectomy     TOTAL ABDOMINAL HYSTERECTOMY W/ BILATERAL SALPINGOOPHORECTOMY      Current Outpatient Medications  Medication Sig Dispense Refill    amiodarone (PACERONE) 200 MG tablet Take one tablet by mouth twice daily for 2 weeks, then take one tablet by mouth daily. 104 tablet 3   amoxicillin (AMOXIL) 500 MG tablet Take 2,000 mg by mouth as directed. Take 4 tablets 1 hour prior to dental procedures and cleanings     apixaban (ELIQUIS) 2.5 MG TABS tablet Take 1 tablet (2.5 mg total) by mouth 2 (two) times daily. 180 tablet 3   atorvastatin (LIPITOR) 10 MG tablet Take 1 tablet (10 mg total) by mouth daily. Needs appointment for further refills. [phone] 90 tablet 1   Biotin 1000 MCG tablet Take 1,000 mcg by mouth daily.     cetirizine (ZYRTEC) 5 MG tablet Take 1 tablet (5 mg total) by mouth daily. 30 tablet 1   Cholecalciferol (VITAMIN D3) 10000 UNITS capsule Take 10,000 Units by mouth daily.       fluorouracil (EFUDEX) 5 % cream Apply topically as needed.     metoprolol succinate (TOPROL-XL) 25 MG 24 hr tablet Take 1 tablet (25 mg total) by mouth daily. 90 tablet 3   olmesartan (BENICAR) 40 MG tablet Take 1 tablet (40 mg total) by mouth daily. For blood pressure 90 tablet 1   No current facility-administered medications for this encounter.    Allergies  Allergen Reactions   Donepezil Other (See Comments)    bradycardia   Actonel [Risedronate Sodium] Nausea And Vomiting   Aspirin-Caffeine  Upset stomach    Codeine     REACTION: upset stomach   Memantine    Penicillins    Sulfonamide Derivatives     REACTION: swollen tongue    Social History   Socioeconomic History   Marital status: Widowed    Spouse name: Not on file   Number of children: 3   Years of education: Not on file   Highest education level: 9th grade  Occupational History   Occupation: Retired  Tobacco Use   Smoking status: Never   Smokeless tobacco: Never  Vaping Use   Vaping Use: Never used  Substance and Sexual Activity   Alcohol use: No   Drug use: No   Sexual activity: Not on file  Other Topics Concern   Not on file  Social History  Narrative   Lives with son -  2 daughters live nearby and visit frequently, they help with grocery shopping, etc   Right Handed    Drinks 2-3  cups caffeine daily   Social Determinants of Health   Financial Resource Strain: Low Risk  (05/07/2020)   Overall Financial Resource Strain (CARDIA)    Difficulty of Paying Living Expenses: Not hard at all  Food Insecurity: No Food Insecurity (05/07/2020)   Hunger Vital Sign    Worried About Running Out of Food in the Last Year: Never true    [place] in the Last Year: Never true  Transportation Needs: No Transportation Needs (05/07/2020)   PRAPARE - Hydrologist (Medical): No    Lack of Transportation (Non-Medical): No  Physical Activity: Insufficiently Active (05/09/2021)   Exercise Vital Sign    Days of Exercise per Week: 7 days    Minutes of Exercise per Session: 10 min  Stress: Stress Concern Present (05/09/2021)   [place]    Feeling of Stress : To some extent  Social Connections: Moderately Integrated (05/09/2021)   Social Connection and Isolation Panel [NHANES]    Frequency of Communication with Friends and Family: More than three times a week    Frequency of Social Gatherings with Friends and Family: More than three times a week    Attends Religious Services: More than 4 times per year    Active Member of [employer] or Organizations: Yes    Attends Archivist Meetings: More than 4 times per year    Marital Status: Widowed  Intimate Partner Violence: Not At Risk (05/09/2021)   Humiliation, Afraid, Rape, and Kick questionnaire    Fear of Current or Ex-Partner: No    Emotionally Abused: No    Physically Abused: No    Sexually Abused: No    Family History  Problem Relation Age of Onset   Heart disease Father    Lupus Father    Heart attack Father    Heart disease Mother    Colon cancer Brother    Heart disease Brother      ROS- All systems are reviewed and negative except as per the HPI above  Physical Exam: Vitals:   12/17/21 1432  BP: (!) 158/68  Pulse: 65  Weight: 48.4 kg  Height: 5' 3.5" (1.613 m)   Wt Readings from Last 3 Encounters:  12/17/21 48.4 kg  11/28/21 50.6 kg  08/08/21 49.2 kg    Labs: Lab Results  Component Value Date   NA 142 08/08/2021   K 4.2 08/08/2021   CL 103 08/08/2021  CO2 25 08/08/2021   GLUCOSE 88 08/08/2021   BUN 20 08/08/2021   CREATININE 0.93 08/08/2021   CALCIUM 10.0 08/08/2021   No results found for: "INR" Lab Results  Component Value Date   CHOL 164 08/08/2021   HDL 79 08/08/2021   LDLCALC 71 08/08/2021   TRIG 77 08/08/2021     GEN- The patient is well appearing, alert and oriented x 3 today.   Head- normocephalic, atraumatic Eyes-  Sclera clear, conjunctiva pink Ears- hearing intact Oropharynx- clear Neck- supple, no JVP Lymph- no cervical lymphadenopathy Lungs- Clear to ausculation bilaterally, normal work of breathing Heart- Regular rate and rhythm, no murmurs, rubs or gallops, PMI not laterally displaced GI- soft, NT, ND, + BS Extremities- no clubbing, cyanosis, or edema MS- no significant deformity or atrophy Skin- no rash or lesion Psych- euthymic mood, full affect Neuro- strength and sensation are intact  EKG-Sinus rhythm with first degree AV block, LAFB, v rate 68 bpm, pr int 238 ms, qrs int 136 ms, qtx 497 ms    Assessment and Plan:  1. Afib Loaded on amiodarone  and is now back in SR She is now on 200 mg daily  She feels so much better/breathing has returned to baseline   2. CHA2DS2VASc  score of 5  Continue eliquis 2.'5mg'$  bid  Stressed to take on regular basis  I will see back in 3 weeks   Butch Penny C. Matika Bartell, [facility] [address] [place], [place] [id number] [phone]

## 2021-12-20 ENCOUNTER — Encounter: Payer: Self-pay | Admitting: Pulmonary Disease

## 2021-12-31 ENCOUNTER — Ambulatory Visit: Payer: Medicare HMO | Admitting: Cardiovascular Disease

## 2022-01-07 ENCOUNTER — Encounter: Payer: Self-pay | Admitting: Family Medicine

## 2022-01-07 ENCOUNTER — Ambulatory Visit (INDEPENDENT_AMBULATORY_CARE_PROVIDER_SITE_OTHER): Payer: Medicare HMO | Admitting: Family Medicine

## 2022-01-07 VITALS — BP 160/76 | HR 63 | Temp 97.1°F | Ht 63.5 in | Wt 108.8 lb

## 2022-01-07 DIAGNOSIS — E782 Mixed hyperlipidemia: Secondary | ICD-10-CM

## 2022-01-07 DIAGNOSIS — Z23 Encounter for immunization: Secondary | ICD-10-CM

## 2022-01-07 DIAGNOSIS — I10 Essential (primary) hypertension: Secondary | ICD-10-CM | POA: Diagnosis not present

## 2022-01-07 DIAGNOSIS — I480 Paroxysmal atrial fibrillation: Secondary | ICD-10-CM

## 2022-01-07 MED ORDER — OLMESARTAN MEDOXOMIL 40 MG PO TABS: 40.0000 mg | ORAL_TABLET | Freq: Every day | ORAL | 3 refills | Status: DC

## 2022-01-07 NOTE — Progress Notes (Signed)
Subjective:  Patient ID: [name], female    DOB: [birth year]  Age: 88 y.o. MRN: [id number]  CC: Medical Management of Chronic Issues   HPI [name] presents for  follow-up of hypertension. Patient has no history of headache chest pain or shortness of breath or recent cough. Patient also denies symptoms of TIA such as focal numbness or weakness. Patient denies side effects from medication. States taking it regularly. Atrial fibrillation follow up. Pt. is treated with rate control and anticoagulation. Pt.  denies palpitations, rapid rate, chest pain, dyspnea and edema. There has been no bleeding from nose or gums. Pt. has not noticed blood with urine or stool.  Although there is routine bruising easily, it is not excessive.  in for follow-up of elevated cholesterol. Doing well without complaints on current medication. Denies side effects of statin including myalgia and arthralgia and nausea. Currently no chest pain, shortness of breath or other cardiovascular related symptoms noted.     History [name] has a past medical history of Arthritis, Atrial fibrillation ([place]), COVID-19, CVA (cerebral vascular accident) ([place]), HTN (hypertension), MR (mitral regurgitation), NSTEMI (non-ST elevated myocardial infarction) ([place]), Orthostatic hypotension (11/06/2011), and Pericarditis.   She has a past surgical history that includes anterior repair with perigee graft (04/16/06); Anterior and posterior vaginal repair (10/15/04); sacral spinous ligament suspension of vagina; suprapubic cystectomy; Total abdominal hysterectomy w/ bilateral salpingoophorectomy; and Mitral valve replacement.   Her family history includes Colon cancer in her brother; Heart attack in her father; Heart disease in her brother, father, and mother; Lupus in her father.She reports that she has never smoked. She has never used smokeless tobacco. She reports that she does not drink alcohol and does not use drugs.  Current Outpatient  Medications on File Prior to Visit  Medication Sig Dispense Refill   amiodarone (PACERONE) 200 MG tablet Take one tablet by mouth twice daily for 2 weeks, then take one tablet by mouth daily. 104 tablet 3   amoxicillin (AMOXIL) 500 MG tablet Take 2,000 mg by mouth as directed. Take 4 tablets 1 hour prior to dental procedures and cleanings     apixaban (ELIQUIS) 2.5 MG TABS tablet Take 1 tablet (2.5 mg total) by mouth 2 (two) times daily. 180 tablet 3   atorvastatin (LIPITOR) 10 MG tablet Take 1 tablet (10 mg total) by mouth daily. Needs appointment for further refills. [phone] 90 tablet 1   Biotin 1000 MCG tablet Take 1,000 mcg by mouth daily.     cetirizine (ZYRTEC) 5 MG tablet Take 1 tablet (5 mg total) by mouth daily. 30 tablet 1   Cholecalciferol (VITAMIN D3) 10000 UNITS capsule Take 10,000 Units by mouth daily.       fluorouracil (EFUDEX) 5 % cream Apply topically as needed.     metoprolol succinate (TOPROL-XL) 25 MG 24 hr tablet Take 1 tablet (25 mg total) by mouth daily. 90 tablet 3   No current facility-administered medications on file prior to visit.    ROS Review of Systems  Constitutional: Negative.   HENT: Negative.    Eyes:  Negative for visual disturbance.  Respiratory:  Negative for shortness of breath.   Cardiovascular:  Negative for chest pain.  Gastrointestinal:  Negative for abdominal pain.  Musculoskeletal:  Negative for arthralgias.    Objective:  BP (!) 160/76   Pulse 63   Temp (!) 97.1 F (36.2 C)   Ht 5' 3.5" (1.613 m)   Wt 108 lb 12.8 oz (49.4 kg)  SpO2 98%   BMI 18.97 kg/m   BP Readings from Last 3 Encounters:  01/07/22 (!) 160/76  12/17/21 (!) 158/68  11/28/21 108/84    Wt Readings from Last 3 Encounters:  01/07/22 108 lb 12.8 oz (49.4 kg)  12/17/21 106 lb 9.6 oz (48.4 kg)  11/28/21 111 lb 9.6 oz (50.6 kg)     Physical Exam Constitutional:      General: She is not in acute distress.    Appearance: She is well-developed.   Cardiovascular:     Rate and Rhythm: Normal rate and regular rhythm.  Pulmonary:     Breath sounds: Normal breath sounds.  Musculoskeletal:        General: Normal range of motion.  Skin:    General: Skin is warm and dry.  Neurological:     Mental Status: She is alert and oriented to person, place, and time.       Assessment & Plan:   [name] was seen today for medical management of chronic issues.  Diagnoses and all orders for this visit:  Paroxysmal atrial fibrillation ([place]) -     CBC with Differential/Platelet -     CMP14+EGFR -     Lipid panel  Essential hypertension -     CBC with Differential/Platelet -     CMP14+EGFR -     Lipid panel  Mixed hyperlipidemia -     CBC with Differential/Platelet -     CMP14+EGFR -     Lipid panel  Need for immunization against influenza -     Flu Vaccine QUAD High Dose(Fluad)  Other orders -     olmesartan (BENICAR) 40 MG tablet; Take 1 tablet (40 mg total) by mouth daily. For blood pressure   Allergies as of 01/07/2022       Reactions   Donepezil Other (See Comments)   bradycardia   Actonel [risedronate Sodium] Nausea And Vomiting   Aspirin-caffeine    Upset stomach   Codeine    REACTION: upset stomach   Memantine    Penicillins    Sulfonamide Derivatives    REACTION: swollen tongue        Medication List        Accurate as of January 07, 2022  7:29 PM. If you have any questions, ask your nurse or doctor.          amiodarone 200 MG tablet Commonly known as: PACERONE Take one tablet by mouth twice daily for 2 weeks, then take one tablet by mouth daily.   amoxicillin 500 MG tablet Commonly known as: AMOXIL Take 2,000 mg by mouth as directed. Take 4 tablets 1 hour prior to dental procedures and cleanings   apixaban 2.5 MG Tabs tablet Commonly known as: ELIQUIS Take 1 tablet (2.5 mg total) by mouth 2 (two) times daily.   atorvastatin 10 MG tablet Commonly known as: LIPITOR Take 1 tablet (10 mg total)  by mouth daily. Needs appointment for further refills. [phone]   Biotin 1000 MCG tablet Take 1,000 mcg by mouth daily.   cetirizine 5 MG tablet Commonly known as: ZYRTEC Take 1 tablet (5 mg total) by mouth daily.   fluorouracil 5 % cream Commonly known as: EFUDEX Apply topically as needed.   metoprolol succinate 25 MG 24 hr tablet Commonly known as: TOPROL-XL Take 1 tablet (25 mg total) by mouth daily.   olmesartan 40 MG tablet Commonly known as: Benicar Take 1 tablet (40 mg total) by mouth daily. For blood pressure  Vitamin D3 250 MCG (10000 UT) capsule Take 10,000 Units by mouth daily.        Meds ordered this encounter  Medications   olmesartan (BENICAR) 40 MG tablet    Sig: Take 1 tablet (40 mg total) by mouth daily. For blood pressure    Dispense:  90 tablet    Refill:  3      Follow-up: Return in about 6 months (around 07/08/2022), or if symptoms worsen or fail to improve.  [name], M.D.

## 2022-01-08 ENCOUNTER — Ambulatory Visit (HOSPITAL_COMMUNITY)
Admission: RE | Admit: 2022-01-08 | Discharge: 2022-01-08 | Disposition: A | Discharge status: Home / Self Care | Payer: Medicare HMO | Source: Ambulatory Visit | Attending: Nurse Practitioner | Admitting: Nurse Practitioner

## 2022-01-08 ENCOUNTER — Encounter (HOSPITAL_COMMUNITY): Payer: Self-pay | Admitting: Nurse Practitioner

## 2022-01-08 VITALS — BP 170/70 | HR 61 | Ht 63.5 in | Wt 108.0 lb

## 2022-01-08 DIAGNOSIS — D6869 Other thrombophilia: Secondary | ICD-10-CM

## 2022-01-08 DIAGNOSIS — Z7901 Long term (current) use of anticoagulants: Secondary | ICD-10-CM | POA: Diagnosis not present

## 2022-01-08 DIAGNOSIS — I480 Paroxysmal atrial fibrillation: Secondary | ICD-10-CM | POA: Diagnosis not present

## 2022-01-08 DIAGNOSIS — I10 Essential (primary) hypertension: Secondary | ICD-10-CM | POA: Insufficient documentation

## 2022-01-08 DIAGNOSIS — Z79899 Other long term (current) drug therapy: Secondary | ICD-10-CM | POA: Diagnosis not present

## 2022-01-08 LAB — CBC WITH DIFFERENTIAL/PLATELET
Basophils Absolute: 0 10*3/uL (ref 0.0–0.2)
Basos: 0 %
EOS (ABSOLUTE): 0.1 10*3/uL (ref 0.0–0.4)
Eos: 2 %
Hematocrit: 39.8 % (ref 34.0–46.6)
Hemoglobin: 13.2 g/dL (ref 11.1–15.9)
Immature Grans (Abs): 0 10*3/uL (ref 0.0–0.1)
Immature Granulocytes: 0 %
Lymphocytes Absolute: 1.5 10*3/uL (ref 0.7–3.1)
Lymphs: 22 %
MCH: 30.5 pg (ref 26.6–33.0)
MCHC: 33.2 g/dL (ref 31.5–35.7)
MCV: 92 fL (ref 79–97)
Monocytes Absolute: 0.5 10*3/uL (ref 0.1–0.9)
Monocytes: 7 %
Neutrophils Absolute: 4.8 10*3/uL (ref 1.4–7.0)
Neutrophils: 69 %
Platelets: 218 10*3/uL (ref 150–450)
RBC: 4.33 x10E6/uL (ref 3.77–5.28)
RDW: 13.2 % (ref 11.7–15.4)
WBC: 6.9 10*3/uL (ref 3.4–10.8)

## 2022-01-08 LAB — LIPID PANEL
Chol/HDL Ratio: 2.1 ratio (ref 0.0–4.4)
Cholesterol, Total: 169 mg/dL (ref 100–199)
HDL: 80 mg/dL (ref >39)
LDL Chol Calc (NIH): 74 mg/dL (ref 0–99)
Triglycerides: 84 mg/dL (ref 0–149)
VLDL Cholesterol Cal: 15 mg/dL (ref 5–40)

## 2022-01-08 LAB — CMP14+EGFR
ALT: 16 IU/L (ref 0–32)
AST: 21 IU/L (ref 0–40)
Albumin/Globulin Ratio: 2.1 (ref 1.2–2.2)
Albumin: 4.5 g/dL (ref 3.6–4.6)
Alkaline Phosphatase: 68 IU/L (ref 44–121)
BUN/Creatinine Ratio: 15 (ref 12–28)
BUN: 15 mg/dL (ref 10–36)
Bilirubin Total: 0.8 mg/dL (ref 0.0–1.2)
CO2: 21 mmol/L (ref 20–29)
Calcium: 9.4 mg/dL (ref 8.7–10.3)
Chloride: 103 mmol/L (ref 96–106)
Creatinine, Ser: 1.03 mg/dL — ABNORMAL HIGH (ref 0.57–1.00)
Globulin, Total: 2.1 g/dL (ref 1.5–4.5)
Glucose: 80 mg/dL (ref 70–99)
Potassium: 4 mmol/L (ref 3.5–5.2)
Sodium: 143 mmol/L (ref 134–144)
Total Protein: 6.6 g/dL (ref 6.0–8.5)
eGFR: 51 mL/min/{1.73_m2} — ABNORMAL LOW (ref >59)

## 2022-01-08 MED ORDER — AMIODARONE HCL 200 MG PO TABS: ORAL_TABLET | ORAL | Status: DC

## 2022-01-08 NOTE — Progress Notes (Signed)
Primary Care Physician: [name], MD Referring Physician: Dr. Teena Irani [name] is a 88 y.o. female with a h/o  f/u post MV repair TTE 01/12/19 showed trace MR with low diastolic gradients and normal EF 60%, HTN.Hospitalized 09/27/20 with COVID causing afib and new DCM with TTE showing EF 30-35% MV repair fine with trivial MR no MS mean gradient 4 mmhg  Converted with amiodarone to slow sinus and aricept d/c. Added Toprol, ARB and eliquis 2.5 bid.  She is now in the afib clinic as she  recently saw Dr. Johnsie Cancel 11/28/21 and was back in afib with RVR. She was started on amiodarone 200 mg bid x one week and is now on amiodarone 200 mg daily. She is in SR and feels she went back into SR last yesterday. She feels improved and is breathing much better in SR.   F/u in afib clinic, 01/08/22. She remains in SR on amiodarone 200 mg daily. She feels well, in fact she is very happy that she feels so well she has been able to continue her dusting of the parsonage once a week. Compliant with anticoagulation.   Today, she denies symptoms of palpitations, chest pain, shortness of breath, orthopnea, PND, lower extremity edema, dizziness, presyncope, syncope, or neurologic sequela. The patient is tolerating medications without difficulties and is otherwise without complaint today.   Past Medical History:  Diagnosis Date   Arthritis    Atrial fibrillation ([place])    postoperative   COVID-19    CVA (cerebral vascular accident) ([place])    HTN (hypertension)    MR (mitral regurgitation)    severe   NSTEMI (non-ST elevated myocardial infarction) ([place])    Orthostatic hypotension 11/06/2011   Pericarditis    postoperative   Past Surgical History:  Procedure Laterality Date   ANTERIOR AND POSTERIOR VAGINAL REPAIR  10/15/04   Dr. Nori Riis    anterior repair with perigee graft  04/16/06   posterior repair with apogee graft; lynx mid urethral sling; sacrospinous ligamnet suspension and cystoscopy; Dr. Joya Martyr    MITRAL VALVE REPLACEMENT     sacral spinous ligament suspension of vagina     suprapubic cystectomy     TOTAL ABDOMINAL HYSTERECTOMY W/ BILATERAL SALPINGOOPHORECTOMY      Current Outpatient Medications  Medication Sig Dispense Refill   amoxicillin (AMOXIL) 500 MG tablet Take 2,000 mg by mouth as directed. Take 4 tablets 1 hour prior to dental procedures and cleanings     apixaban (ELIQUIS) 2.5 MG TABS tablet Take 1 tablet (2.5 mg total) by mouth 2 (two) times daily. 180 tablet 3   atorvastatin (LIPITOR) 10 MG tablet Take 1 tablet (10 mg total) by mouth daily. Needs appointment for further refills. [phone] 90 tablet 1   Biotin 1000 MCG tablet Take 1,000 mcg by mouth daily.     cetirizine (ZYRTEC) 5 MG tablet Take 1 tablet (5 mg total) by mouth daily. 30 tablet 1   Cholecalciferol (VITAMIN D3) 10000 UNITS capsule Take 10,000 Units by mouth daily.       fluorouracil (EFUDEX) 5 % cream Apply topically as needed.     metoprolol succinate (TOPROL-XL) 25 MG 24 hr tablet Take 1 tablet (25 mg total) by mouth daily. 90 tablet 3   olmesartan (BENICAR) 40 MG tablet Take 1 tablet (40 mg total) by mouth daily. For blood pressure 90 tablet 3   amiodarone (PACERONE) 200 MG tablet Take one tablet by mouth daily     No current  facility-administered medications for this encounter.    Allergies  Allergen Reactions   Donepezil Other (See Comments)    bradycardia   Actonel [Risedronate Sodium] Nausea And Vomiting   Aspirin-Caffeine     Upset stomach    Codeine     REACTION: upset stomach   Memantine    Penicillins    Sulfonamide Derivatives     REACTION: swollen tongue    Social History   Socioeconomic History   Marital status: Widowed    Spouse name: Not on file   Number of children: 3   Years of education: Not on file   Highest education level: 9th grade  Occupational History   Occupation: Retired  Tobacco Use   Smoking status: Never   Smokeless tobacco: Never  Vaping Use    Vaping Use: Never used  Substance and Sexual Activity   Alcohol use: No   Drug use: No   Sexual activity: Not on file  Other Topics Concern   Not on file  Social History Narrative   Lives with son -  2 daughters live nearby and visit frequently, they help with grocery shopping, etc   Right Handed    Drinks 2-3  cups caffeine daily   Social Determinants of Health   Financial Resource Strain: Low Risk  (05/07/2020)   Overall Financial Resource Strain (CARDIA)    Difficulty of Paying Living Expenses: Not hard at all  Food Insecurity: No Food Insecurity (05/07/2020)   Hunger Vital Sign    Worried About Running Out of Food in the Last Year: Never true    [place] in the Last Year: Never true  Transportation Needs: No Transportation Needs (05/07/2020)   PRAPARE - Hydrologist (Medical): No    Lack of Transportation (Non-Medical): No  Physical Activity: Insufficiently Active (05/09/2021)   Exercise Vital Sign    Days of Exercise per Week: 7 days    Minutes of Exercise per Session: 10 min  Stress: Stress Concern Present (05/09/2021)   [place]    Feeling of Stress : To some extent  Social Connections: Moderately Integrated (05/09/2021)   Social Connection and Isolation Panel [NHANES]    Frequency of Communication with Friends and Family: More than three times a week    Frequency of Social Gatherings with Friends and Family: More than three times a week    Attends Religious Services: More than 4 times per year    Active Member of [employer] or Organizations: Yes    Attends Archivist Meetings: More than 4 times per year    Marital Status: Widowed  Intimate Partner Violence: Not At Risk (05/09/2021)   Humiliation, Afraid, Rape, and Kick questionnaire    Fear of Current or Ex-Partner: No    Emotionally Abused: No    Physically Abused: No    Sexually Abused: No    Family History   Problem Relation Age of Onset   Heart disease Father    Lupus Father    Heart attack Father    Heart disease Mother    Colon cancer Brother    Heart disease Brother     ROS- All systems are reviewed and negative except as per the HPI above  Physical Exam: Vitals:   01/08/22 1057  BP: (!) 170/70  Pulse: 61  Weight: 49 kg  Height: 5' 3.5" (1.613 m)   Wt Readings from Last 3 Encounters:  01/08/22  49 kg  01/07/22 49.4 kg  12/17/21 48.4 kg    Labs: Lab Results  Component Value Date   NA 143 01/07/2022   K 4.0 01/07/2022   CL 103 01/07/2022   CO2 21 01/07/2022   GLUCOSE 80 01/07/2022   BUN 15 01/07/2022   CREATININE 1.03 (H) 01/07/2022   CALCIUM 9.4 01/07/2022   No results found for: "INR" Lab Results  Component Value Date   CHOL 169 01/07/2022   HDL 80 01/07/2022   LDLCALC 74 01/07/2022   TRIG 84 01/07/2022     GEN- The patient is well appearing, alert and oriented x 3 today.   Head- normocephalic, atraumatic Eyes-  Sclera clear, conjunctiva pink Ears- hearing intact Oropharynx- clear Neck- supple, no JVP Lymph- no cervical lymphadenopathy Lungs- Clear to ausculation bilaterally, normal work of breathing Heart- Regular rate and rhythm, no murmurs, rubs or gallops, PMI not laterally displaced GI- soft, NT, ND, + BS Extremities- no clubbing, cyanosis, or edema MS- no significant deformity or atrophy Skin- no rash or lesion Psych- euthymic mood, full affect Neuro- strength and sensation are intact  EKG-Sinus rhythm with first degree AV block, LAFB, v rate 68 bpm, pr int 238 ms, qrs int 136 ms, qtx 497 ms    Assessment and Plan:  1. Afib Loaded on amiodarone  and is now back in SR She is now on 200 mg daily  She feels so much better/breathing has returned to baseline   2. CHA2DS2VASc  score of 5  Continue eliquis 2.'5mg'$  bid  Stressed to take on regular basis No bleeding issues  3. HTN BP elevated today at 1170/70, rechecked at 160/64.  I asked  her to check intermittently at home for baseline  F/u with Dr. Johnsie Cancel 12/18 Afib clinic as needed   Butch Penny C. Kole Hilyard, [facility] [address] [place], [place] [address] [phone]

## 2022-01-08 NOTE — Progress Notes (Signed)
Hello Ronni,  Your lab result is normal and/or stable.Some minor variations that are not significant are commonly marked abnormal, but do not represent any medical problem for you.  Best regards, Coralee Edberg, M.D.

## 2022-01-16 ENCOUNTER — Other Ambulatory Visit: Payer: Self-pay | Admitting: Cardiovascular Disease

## 2022-02-19 ENCOUNTER — Telehealth: Payer: Self-pay | Admitting: Cardiovascular Disease

## 2022-02-19 MED ORDER — APIXABAN 2.5 MG PO TABS: 2.5000 mg | ORAL_TABLET | Freq: Two times a day (BID) | ORAL | 1 refills | Status: DC

## 2022-02-19 NOTE — Telephone Encounter (Signed)
Prescription refill request for Eliquis received. Indication: Afib  Last office visit: 01/08/22 Kayleen Memos)  Scr: 1.03 (01/07/22)  Age: 88 Weight: 49kg  Appropriate dose and refill sent to requested pharmacy.

## 2022-02-19 NOTE — Telephone Encounter (Signed)
*  STAT* If patient is at the pharmacy, call can be transferred to refill team.   1. Which medications need to be refilled? (please list name of each medication and dose if known) Eliquis  2. Which pharmacy/location (including street and city if local pharmacy) is medication to be sent to? [facility] [place]   Do they need a 30 day or 90 day supply? Enough her appointment on 05-07-22

## 2022-02-24 ENCOUNTER — Ambulatory Visit: Payer: Medicare HMO | Admitting: Cardiovascular Disease

## 2022-03-25 ENCOUNTER — Other Ambulatory Visit: Payer: Self-pay | Admitting: Cardiovascular Disease

## 2022-04-17 ENCOUNTER — Encounter (HOSPITAL_COMMUNITY): Payer: Self-pay | Admitting: *Deleted

## 2022-04-25 NOTE — Progress Notes (Deleted)
Patient ID: [name], female   DOB: Sep 23, 1928, 89 y.o.   MRN: [id number]      89 y.o. f/u post MV repair TTE 01/12/19 showed trace MR with low diastolic gradients and normal EF 60%.  She has HTN on ARB More dizziness on 50 mg Cozaar dose decreased. She thinks statin Makes her feel worse and now taking Qod.  Some LE varicosities   Depression continues. Husband [name] was a patient of mine and died of lung cancer 6 years ago They were married 38 years. Shortly after that [name] died tragically in there back yard of a chain saw accident. Son [name] living with her and two daughters near by.   Has had some memory issues and has been on namenda and aricept  Hospitalized 09/27/20 with COVID causing afib and new DCM with TTE showing EF 30-35% MV repair fine with trivial MR no MS mean gradient 4 mmhg  Converted with amiodarone to slow sinus and aricept d/c Added Toprol, ARB and eliquis 2.5 bid  TTE 01/10/21 EF improved 35-40%   December 2022 more dyspnea Primary ? Sinusitis and started her on Augmentin Cough with sputum rhiorrhea and sore throat with cough and confusion from Namenda/Aricept   Called office again 11/25/20 complaining of dyspnea and more rapid HR;s  She had CT 04/17/21 showing bronchiectasis with mucous plugging in RML and multifocal infiltrates Seen by pulmonary Dr [name] 05/15/21 thought to be improving but appears to have bronchiectasis and MAI  11/28/21:  in rapid fib/flutter She clearly gets dyspnea and fatigue with minimum walking Has not missed any DOAC Spoke with daughter [name] on phone and one of her daughters was with her today She was started on amiodarone and converted when seen in f/u at Afib clinic on 01/08/22   ***  ROS: Denies fever, malais, weight loss, blurry vision, decreased visual acuity, cough, sputum, SOB, hemoptysis, pleuritic pain, palpitaitons, heartburn, abdominal pain, melena, lower extremity edema, claudication, or rash.  All other systems reviewed and  negative  There were no vitals taken for this visit.  Affect appropriate Healthy:  appears stated age 20: normal Neck supple with no adenopathy JVP normal no bruits no thyromegaly Lungs clear with no wheezing and good diaphragmatic motion Heart:  S1/S2 no murmur, no rub, gallop or click PMI normal  Post sternotomy  Abdomen: benighn, BS positve, no tenderness, no AAA no bruit.  No HSM or HJR Distal pulses intact with no bruits No edema Neuro non-focal Skin warm and dry No muscular weakness   Current Outpatient Medications  Medication Sig Dispense Refill   amiodarone (PACERONE) 200 MG tablet Take one tablet by mouth daily     amoxicillin (AMOXIL) 500 MG tablet Take 2,000 mg by mouth as directed. Take 4 tablets 1 hour prior to dental procedures and cleanings     apixaban (ELIQUIS) 2.5 MG TABS tablet Take 1 tablet (2.5 mg total) by mouth 2 (two) times daily. 180 tablet 1   atorvastatin (LIPITOR) 10 MG tablet Take 1 tablet (10 mg total) by mouth daily. 90 tablet 3   Biotin 1000 MCG tablet Take 1,000 mcg by mouth daily.     cetirizine (ZYRTEC) 5 MG tablet Take 1 tablet (5 mg total) by mouth daily. 30 tablet 1   Cholecalciferol (VITAMIN D3) 10000 UNITS capsule Take 10,000 Units by mouth daily.       fluorouracil (EFUDEX) 5 % cream Apply topically as needed.     metoprolol succinate (TOPROL-XL) 25 MG 24 hr  tablet Take 1 tablet by mouth once daily 90 tablet 2   olmesartan (BENICAR) 40 MG tablet Take 1 tablet (40 mg total) by mouth daily. For blood pressure 90 tablet 3   No current facility-administered medications for this visit.    Allergies  Donepezil, Actonel [risedronate sodium], Aspirin-caffeine, Codeine, Memantine, Penicillins, and Sulfonamide derivatives  Electrocardiogram:   12/02/17  SR rate 62 LAD RBBB 04/25/2022 SR rate 58 RBBB/LAD  04/25/2022 afib/flutter rate 122 RBBB LAD   Assessment and Plan  MV Repair:  SBE prophylaxis discussed  Echo 01/10/21 repair intact no  significant MR update echo with dyspnea   HTN: Well controlled.  Continue current medications and low sodium Dash type diet.    Thyroid:  On replacement TSH 3.14 September 2019 normal   PAF: On Toprol and eliquis PAF September converted with oral amiodarone continue at 200 mg dose Improved   DCM:  09/27/20 EF 30-35% in setting of afib/Covid. TTE done 01/10/21 improved to 35-40% Continue beta blocker and ARB She is not volume overloaded    Memory: Aricept d/c in hospital due to bradycardia Sent Dr [name] a message never to restart and she appears to be having side effects from [place] also d/c   Pulmonary:  Frequent URI CT bronchiectasis ? MAI f/u primary /pulmonary   ***  F/U in 6 months   [name]

## 2022-04-30 ENCOUNTER — Telehealth: Payer: Self-pay | Admitting: Family Medicine

## 2022-04-30 NOTE — Telephone Encounter (Signed)
Disregard message. Daughter called back and scheduled patient an appt for tomorrow.

## 2022-04-30 NOTE — Telephone Encounter (Signed)
Daughter says patient has a bad cough and wanted to know if Dr Livia Snellen could send some medicine to pharmacy for patient. Explained to daughter that providers are not allowed to prescribed medicine to patients without the patient having a visit first to be diagnosed. Asked daughter if she wanted to make patient an appt. Daughter declined.  Wants to know what pt can take OTC that would help relieve cough?

## 2022-05-01 ENCOUNTER — Encounter: Payer: Self-pay | Admitting: Family Medicine

## 2022-05-01 ENCOUNTER — Ambulatory Visit (INDEPENDENT_AMBULATORY_CARE_PROVIDER_SITE_OTHER): Payer: Medicare HMO | Admitting: Family Medicine

## 2022-05-01 VITALS — BP 157/73 | HR 67 | Temp 97.5°F | Ht 63.5 in | Wt 108.4 lb

## 2022-05-01 DIAGNOSIS — J069 Acute upper respiratory infection, unspecified: Secondary | ICD-10-CM | POA: Diagnosis not present

## 2022-05-01 MED ORDER — DOXYCYCLINE HYCLATE 100 MG PO TABS: 100.0000 mg | ORAL_TABLET | Freq: Two times a day (BID) | ORAL | 0 refills | Status: DC

## 2022-05-01 MED ORDER — PROMETHAZINE-DM 6.25-15 MG/5ML PO SYRP: 2.5000 mL | ORAL_SOLUTION | Freq: Four times a day (QID) | ORAL | 0 refills | Status: DC | PRN

## 2022-05-01 NOTE — Progress Notes (Signed)
Subjective:  Patient ID: [name], female    DOB: February 18, 1929, 89 y.o.   MRN: [id number]  Patient Care Team: [name], MD as PCP - General (Family Medicine) Josue Hector, MD as PCP - Cardiology (Cardiology) Josue Hector, MD as Consulting Physician (Cardiology) Garvin Fila, MD as Consulting Physician (Neurology) Rigoberto Noel, MD as Consulting Physician (Pulmonary Disease)   Chief Complaint:  Cough (Bad since Sunday, ST Fri but it went away Mon am)   HPI: [name] is a 89 y.o. female presenting on 05/01/2022 for Cough (Bad since Sunday, ST Fri but it went away Mon am)   Pt presents today with complaints of worsening cough, chest congestion, and sputum production. Started out as cold like symptoms which improved and then the cough and chest congestion started.   Cough This is a new problem. The current episode started 1 to 4 weeks ago. The problem has been gradually worsening. The cough is Productive of purulent sputum. Associated symptoms include chills and wheezing. Pertinent negatives include no chest pain, ear congestion, ear pain, fever, headaches, heartburn, hemoptysis, myalgias, nasal congestion, postnasal drip, rash, rhinorrhea, sore throat, shortness of breath, sweats or weight loss. She has tried OTC cough suppressant for the symptoms. The treatment provided no relief.    Relevant past medical, surgical, family, and social history reviewed and updated as indicated.  Allergies and medications reviewed and updated. Data reviewed: Chart in Epic.   Past Medical History:  Diagnosis Date   Arthritis    Atrial fibrillation ([place])    postoperative   COVID-19    CVA (cerebral vascular accident) ([place])    HTN (hypertension)    MR (mitral regurgitation)    severe   NSTEMI (non-ST elevated myocardial infarction) ([place])    Orthostatic hypotension 11/06/2011   Pericarditis    postoperative    Past Surgical History:  Procedure Laterality Date   ANTERIOR  AND POSTERIOR VAGINAL REPAIR  10/15/04   Dr. Nori Riis    anterior repair with perigee graft  04/16/06   posterior repair with apogee graft; lynx mid urethral sling; sacrospinous ligamnet suspension and cystoscopy; Dr. Joya Martyr   MITRAL VALVE REPLACEMENT     sacral spinous ligament suspension of vagina     suprapubic cystectomy     TOTAL ABDOMINAL HYSTERECTOMY W/ BILATERAL SALPINGOOPHORECTOMY      Social History   Socioeconomic History   Marital status: Widowed    Spouse name: Not on file   Number of children: 3   Years of education: Not on file   Highest education level: 9th grade  Occupational History   Occupation: Retired  Tobacco Use   Smoking status: Never   Smokeless tobacco: Never  Vaping Use   Vaping Use: Never used  Substance and Sexual Activity   Alcohol use: No   Drug use: No   Sexual activity: Not on file  Other Topics Concern   Not on file  Social History Narrative   Lives with son -  2 daughters live nearby and visit frequently, they help with grocery shopping, etc   Right Handed    Drinks 2-3  cups caffeine daily   Social Determinants of Health   Financial Resource Strain: Low Risk  (05/07/2020)   Overall Financial Resource Strain (CARDIA)    Difficulty of Paying Living Expenses: Not hard at all  Food Insecurity: No Food Insecurity (05/07/2020)   Hunger Vital Sign    Worried About Running Out of Food  in the Last Year: Never true    [place] in the Last Year: Never true  Transportation Needs: No Transportation Needs (05/07/2020)   PRAPARE - Hydrologist (Medical): No    Lack of Transportation (Non-Medical): No  Physical Activity: Insufficiently Active (05/09/2021)   Exercise Vital Sign    Days of Exercise per Week: 7 days    Minutes of Exercise per Session: 10 min  Stress: Stress Concern Present (05/09/2021)   [place]    Feeling of Stress : To some extent   Social Connections: Moderately Integrated (05/09/2021)   Social Connection and Isolation Panel [NHANES]    Frequency of Communication with Friends and Family: More than three times a week    Frequency of Social Gatherings with Friends and Family: More than three times a week    Attends Religious Services: More than 4 times per year    Active Member of [employer] or Organizations: Yes    Attends Archivist Meetings: More than 4 times per year    Marital Status: Widowed  Intimate Partner Violence: Not At Risk (05/09/2021)   Humiliation, Afraid, Rape, and Kick questionnaire    Fear of Current or Ex-Partner: No    Emotionally Abused: No    Physically Abused: No    Sexually Abused: No    Outpatient Encounter Medications as of 05/01/2022  Medication Sig   amiodarone (PACERONE) 200 MG tablet Take one tablet by mouth daily   apixaban (ELIQUIS) 2.5 MG TABS tablet Take 1 tablet (2.5 mg total) by mouth 2 (two) times daily.   atorvastatin (LIPITOR) 10 MG tablet Take 1 tablet (10 mg total) by mouth daily.   Biotin 1000 MCG tablet Take 1,000 mcg by mouth daily.   cetirizine (ZYRTEC) 5 MG tablet Take 1 tablet (5 mg total) by mouth daily.   Cholecalciferol (VITAMIN D3) 10000 UNITS capsule Take 10,000 Units by mouth daily.     doxycycline (VIBRA-TABS) 100 MG tablet Take 1 tablet (100 mg total) by mouth 2 (two) times daily for 10 days. 1 po bid   fluorouracil (EFUDEX) 5 % cream Apply topically as needed.   metoprolol succinate (TOPROL-XL) 25 MG 24 hr tablet Take 1 tablet by mouth once daily   olmesartan (BENICAR) 40 MG tablet Take 1 tablet (40 mg total) by mouth daily. For blood pressure   promethazine-dextromethorphan (PROMETHAZINE-DM) 6.25-15 MG/5ML syrup Take 2.5 mLs by mouth 4 (four) times daily as needed for cough.   [DISCONTINUED] amoxicillin (AMOXIL) 500 MG tablet Take 2,000 mg by mouth as directed. Take 4 tablets 1 hour prior to dental procedures and cleanings   No facility-administered  encounter medications on file as of 05/01/2022.    Allergies  Allergen Reactions   Donepezil Other (See Comments)    bradycardia   Actonel [Risedronate Sodium] Nausea And Vomiting   Aspirin-Caffeine     Upset stomach    Codeine     REACTION: upset stomach   Memantine    Penicillins    Sulfonamide Derivatives     REACTION: swollen tongue    Review of Systems  Constitutional:  Positive for activity change and chills. Negative for appetite change, diaphoresis, fatigue, fever, unexpected weight change and weight loss.  HENT:  Positive for congestion. Negative for dental problem, drooling, ear discharge, ear pain, facial swelling, hearing loss, mouth sores, nosebleeds, postnasal drip, rhinorrhea, sinus pressure, sinus pain, sneezing, sore throat, tinnitus, trouble swallowing  and voice change.   Eyes:  Negative for photophobia and visual disturbance.  Respiratory:  Positive for cough and wheezing. Negative for apnea, hemoptysis, choking, chest tightness, shortness of breath and stridor.   Cardiovascular:  Negative for chest pain, palpitations and leg swelling.  Gastrointestinal:  Negative for abdominal pain and heartburn.  Genitourinary:  Negative for decreased urine volume and difficulty urinating.  Musculoskeletal:  Negative for myalgias.  Skin:  Negative for rash.  Neurological:  Negative for dizziness, weakness, light-headedness and headaches.  Psychiatric/Behavioral:  Negative for confusion.   All other systems reviewed and are negative.       Objective:  BP (!) 157/73   Pulse 67   Temp (!) 97.5 F (36.4 C) (Oral)   Ht 5' 3.5" (1.613 m)   Wt 108 lb 6.4 oz (49.2 kg)   SpO2 96%   BMI 18.90 kg/m    Wt Readings from Last 3 Encounters:  05/01/22 108 lb 6.4 oz (49.2 kg)  01/08/22 108 lb (49 kg)  01/07/22 108 lb 12.8 oz (49.4 kg)    Physical Exam Vitals and nursing note reviewed.  Constitutional:      Appearance: Normal appearance.  HENT:     Head: Normocephalic and  atraumatic.     Right Ear: Tympanic membrane, ear canal and external ear normal.     Left Ear: Tympanic membrane, ear canal and external ear normal.     Nose: Nose normal.     Mouth/Throat:     Mouth: Mucous membranes are moist.  Eyes:     Conjunctiva/sclera: Conjunctivae normal.     Pupils: Pupils are equal, round, and reactive to light.  Cardiovascular:     Rate and Rhythm: Normal rate. Rhythm irregularly irregular.  Pulmonary:     Effort: Pulmonary effort is normal. No respiratory distress.     Breath sounds: No stridor. Wheezing present. No rhonchi or rales.     Comments: Congested cough Chest:     Chest wall: No tenderness.  Musculoskeletal:     Right lower leg: No edema.     Left lower leg: No edema.  Skin:    General: Skin is warm.     Capillary Refill: Capillary refill takes less than 2 seconds.  Neurological:     Mental Status: She is alert.  Psychiatric:        Mood and Affect: Mood normal.        Behavior: Behavior normal.        Thought Content: Thought content normal.        Judgment: Judgment normal.     Results for orders placed or performed in visit on 01/07/22  CBC with Differential/Platelet  Result Value Ref Range   WBC 6.9 3.4 - 10.8 x10E3/uL   RBC 4.33 3.77 - 5.28 x10E6/uL   Hemoglobin 13.2 11.1 - 15.9 g/dL   Hematocrit 39.8 34.0 - 46.6 %   MCV 92 79 - 97 fL   MCH 30.5 26.6 - 33.0 pg   MCHC 33.2 31.5 - 35.7 g/dL   RDW 13.2 11.7 - 15.4 %   Platelets 218 150 - 450 x10E3/uL   Neutrophils 69 Not Estab. %   Lymphs 22 Not Estab. %   Monocytes 7 Not Estab. %   Eos 2 Not Estab. %   Basos 0 Not Estab. %   Neutrophils Absolute 4.8 1.4 - 7.0 x10E3/uL   Lymphocytes Absolute 1.5 0.7 - 3.1 x10E3/uL   Monocytes Absolute 0.5 0.1 - 0.9 x10E3/uL  EOS (ABSOLUTE) 0.1 0.0 - 0.4 x10E3/uL   Basophils Absolute 0.0 0.0 - 0.2 x10E3/uL   Immature Granulocytes 0 Not Estab. %   Immature Grans (Abs) 0.0 0.0 - 0.1 x10E3/uL  CMP14+EGFR  Result Value Ref Range   Glucose  80 70 - 99 mg/dL   BUN 15 10 - 36 mg/dL   Creatinine, Ser 1.03 (H) 0.57 - 1.00 mg/dL   eGFR 51 (L) >59 mL/min/1.73   BUN/Creatinine Ratio 15 12 - 28   Sodium 143 134 - 144 mmol/L   Potassium 4.0 3.5 - 5.2 mmol/L   Chloride 103 96 - 106 mmol/L   CO2 21 20 - 29 mmol/L   Calcium 9.4 8.7 - 10.3 mg/dL   Total Protein 6.6 6.0 - 8.5 g/dL   Albumin 4.5 3.6 - 4.6 g/dL   Globulin, Total 2.1 1.5 - 4.5 g/dL   Albumin/Globulin Ratio 2.1 1.2 - 2.2   Bilirubin Total 0.8 0.0 - 1.2 mg/dL   Alkaline Phosphatase 68 44 - 121 IU/L   AST 21 0 - 40 IU/L   ALT 16 0 - 32 IU/L  Lipid panel  Result Value Ref Range   Cholesterol, Total 169 100 - 199 mg/dL   Triglycerides 84 0 - 149 mg/dL   HDL 80 >39 mg/dL   VLDL Cholesterol Cal 15 5 - 40 mg/dL   LDL Chol Calc (NIH) 74 0 - 99 mg/dL   Chol/HDL Ratio 2.1 0.0 - 4.4 ratio       Pertinent labs & imaging results that were available during my care of the patient were reviewed by me and considered in my medical decision making.  Assessment & Plan:  Wilburta was seen today for cough.  Diagnoses and all orders for this visit:  URI with cough and congestion Ongoing and worsening symptoms despite symptomatic care at home. Tessalon has not been beneficial, will trial below, sedation precautions discussed in detail. Report new, worsening, or persistent symptoms. Follow up in 2 weeks for reevaluation.  -   promethazine-dextromethorphan (PROMETHAZINE-DM) 6.25-15 MG/5ML syrup; Take 2.5 mLs by mouth 4 (four) times daily as needed for cough. -     doxycycline (VIBRA-TABS) 100 MG tablet; Take 1 tablet (100 mg total) by mouth 2 (two) times daily for 10 days. 1 po bid     Continue all other maintenance medications.  Follow up plan: Return in about 2 weeks (around 05/15/2022), or if symptoms worsen or fail to improve, for URI.   Continue healthy lifestyle choices, including diet (rich in fruits, vegetables, and lean proteins, and low in salt and simple carbohydrates) and  exercise (at least 30 minutes of moderate physical activity daily).  Educational handout given for URI  The above assessment and management plan was discussed with the patient. The patient verbalized understanding of and has agreed to the management plan. Patient is aware to call the clinic if they develop any new symptoms or if symptoms persist or worsen. Patient is aware when to return to the clinic for a follow-up visit. Patient educated on when it is appropriate to go to the emergency department.   Monia Pouch, FNP-C [facility] [phone]

## 2022-05-05 ENCOUNTER — Ambulatory Visit (INDEPENDENT_AMBULATORY_CARE_PROVIDER_SITE_OTHER): Payer: Medicare HMO | Admitting: Family Medicine

## 2022-05-05 ENCOUNTER — Encounter: Payer: Self-pay | Admitting: Family Medicine

## 2022-05-05 ENCOUNTER — Ambulatory Visit (INDEPENDENT_AMBULATORY_CARE_PROVIDER_SITE_OTHER): Payer: Medicare HMO

## 2022-05-05 VITALS — BP 159/69 | HR 95 | Temp 97.7°F | Ht 63.5 in | Wt 107.2 lb

## 2022-05-05 DIAGNOSIS — J069 Acute upper respiratory infection, unspecified: Secondary | ICD-10-CM

## 2022-05-05 DIAGNOSIS — J449 Chronic obstructive pulmonary disease, unspecified: Secondary | ICD-10-CM | POA: Diagnosis not present

## 2022-05-05 DIAGNOSIS — R059 Cough, unspecified: Secondary | ICD-10-CM | POA: Diagnosis not present

## 2022-05-05 DIAGNOSIS — R0602 Shortness of breath: Secondary | ICD-10-CM | POA: Diagnosis not present

## 2022-05-05 MED ORDER — CEFUROXIME AXETIL 250 MG PO TABS: 250.0000 mg | ORAL_TABLET | Freq: Two times a day (BID) | ORAL | 0 refills | Status: DC

## 2022-05-05 MED ORDER — HYDROCODONE BIT-HOMATROP MBR 5-1.5 MG/5ML PO SOLN: 1.2500 mL | Freq: Four times a day (QID) | ORAL | 0 refills | Status: AC | PRN

## 2022-05-05 NOTE — Progress Notes (Signed)
Subjective:  Patient ID: [name], female    DOB: 1928-06-06  Age: 89 y.o. MRN: [id number]  CC: Cough and Nasal Congestion   HPI SCHAE DOUTHAT presents for cough persisting in spite of scrip given last week. Producing yellow sputum still. Cough med not effective. Burns her tongue. Not dyspneic. Cough is profuse. No fever. Worried about Covid requests testing. Also concerned for pneumonia.      05/05/2022   10:28 AM 05/01/2022    2:00 PM 01/07/2022   11:25 AM  Depression screen PHQ 2/9  Decreased Interest 0 0 0  Down, Depressed, Hopeless 0 0 0  PHQ - 2 Score 0 0 0  Altered sleeping  0   Tired, decreased energy  0   Change in appetite  0   Feeling bad or failure about yourself   0   Trouble concentrating  0   Moving slowly or fidgety/restless  0   Suicidal thoughts  0   PHQ-9 Score  0   Difficult doing work/chores  Not difficult at all     History Oneka has a past medical history of Arthritis, Atrial fibrillation ([place]), COVID-19, CVA (cerebral vascular accident) ([place]), HTN (hypertension), MR (mitral regurgitation), NSTEMI (non-ST elevated myocardial infarction) ([place]), Orthostatic hypotension (11/06/2011), and Pericarditis.   She has a past surgical history that includes anterior repair with perigee graft (04/16/06); Anterior and posterior vaginal repair (10/15/04); sacral spinous ligament suspension of vagina; suprapubic cystectomy; Total abdominal hysterectomy w/ bilateral salpingoophorectomy; and Mitral valve replacement.   Her family history includes Colon cancer in her brother; Heart attack in her father; Heart disease in her brother, father, and mother; Lupus in her father.She reports that she has never smoked. She has never used smokeless tobacco. She reports that she does not drink alcohol and does not use drugs.    ROS Review of Systems  Constitutional: Negative.  Negative for fever.  HENT: Negative.    Eyes:  Negative for visual disturbance.  Respiratory:  Positive for  cough. Negative for shortness of breath.   Cardiovascular:  Negative for chest pain.  Gastrointestinal:  Negative for abdominal pain.    Objective:  BP (!) 159/69   Pulse 95   Temp 97.7 F (36.5 C)   Ht 5' 3.5" (1.613 m)   Wt 107 lb 3.2 oz (48.6 kg)   SpO2 95%   BMI 18.69 kg/m   BP Readings from Last 3 Encounters:  05/05/22 (!) 159/69  05/01/22 (!) 157/73  01/08/22 (!) 170/70    Wt Readings from Last 3 Encounters:  05/05/22 107 lb 3.2 oz (48.6 kg)  05/01/22 108 lb 6.4 oz (49.2 kg)  01/08/22 108 lb (49 kg)     Physical Exam Constitutional:      General: She is not in acute distress.    Appearance: She is well-developed.  Cardiovascular:     Rate and Rhythm: Normal rate and regular rhythm.  Pulmonary:     Breath sounds: Normal breath sounds. No wheezing or rales.  Musculoskeletal:        General: Normal range of motion.  Skin:    General: Skin is warm and dry.  Neurological:     Mental Status: She is alert and oriented to person, place, and time.       Assessment & Plan:   Sheleta was seen today for cough and nasal congestion.  Diagnoses and all orders for this visit:  Upper respiratory infection with cough and congestion -  COVID-19, Flu A+B and RSV -     CBC with Differential/Platelet -     BMP8+EGFR -     DG Chest 2 View; Future  Other orders -     cefUROXime (CEFTIN) 250 MG tablet; Take 1 tablet (250 mg total) by mouth 2 (two) times daily with a meal for 10 days. -     HYDROcodone bit-homatropine (HYCODAN) 5-1.5 MG/5ML syrup; Take 1.3 mLs by mouth every 6 (six) hours as needed for up to 5 days for cough.       I have discontinued [place] "[name]"'s promethazine-dextromethorphan and doxycycline. I am also having her start on cefUROXime and HYDROcodone bit-homatropine. Additionally, I am having her maintain her Vitamin D3, Biotin, cetirizine, fluorouracil, olmesartan, amiodarone, atorvastatin, apixaban, and metoprolol succinate.  Allergies  as of 05/05/2022       Reactions   Donepezil Other (See Comments)   bradycardia   Actonel [risedronate Sodium] Nausea And Vomiting   Aspirin-caffeine    Upset stomach   Codeine    REACTION: upset stomach   Memantine    Penicillins    Sulfonamide Derivatives    REACTION: swollen tongue        Medication List        Accurate as of May 05, 2022 12:08 PM. If you have any questions, ask your nurse or doctor.          STOP taking these medications    doxycycline 100 MG tablet Commonly known as: VIBRA-TABS Stopped by: [name], MD   promethazine-dextromethorphan 6.25-15 MG/5ML syrup Commonly known as: PROMETHAZINE-DM Stopped by: [name], MD       TAKE these medications    amiodarone 200 MG tablet Commonly known as: PACERONE Take one tablet by mouth daily   apixaban 2.5 MG Tabs tablet Commonly known as: ELIQUIS Take 1 tablet (2.5 mg total) by mouth 2 (two) times daily.   atorvastatin 10 MG tablet Commonly known as: LIPITOR Take 1 tablet (10 mg total) by mouth daily.   Biotin 1000 MCG tablet Take 1,000 mcg by mouth daily.   cefUROXime 250 MG tablet Commonly known as: CEFTIN Take 1 tablet (250 mg total) by mouth 2 (two) times daily with a meal for 10 days. Started by: [name], MD   cetirizine 5 MG tablet Commonly known as: ZYRTEC Take 1 tablet (5 mg total) by mouth daily.   fluorouracil 5 % cream Commonly known as: EFUDEX Apply topically as needed.   HYDROcodone bit-homatropine 5-1.5 MG/5ML syrup Commonly known as: HYCODAN Take 1.3 mLs by mouth every 6 (six) hours as needed for up to 5 days for cough. Started by: [name], MD   metoprolol succinate 25 MG 24 hr tablet Commonly known as: TOPROL-XL Take 1 tablet by mouth once daily   olmesartan 40 MG tablet Commonly known as: Benicar Take 1 tablet (40 mg total) by mouth daily. For blood pressure   Vitamin D3 250 MCG (10000 UT) Caps Generic drug: Cholecalciferol Take  10,000 Units by mouth daily.         Follow-up: Return if symptoms worsen or fail to improve.  [name], M.D.

## 2022-05-06 LAB — CBC WITH DIFFERENTIAL/PLATELET
Basophils Absolute: 0 10*3/uL (ref 0.0–0.2)
Basos: 1 %
EOS (ABSOLUTE): 0.1 10*3/uL (ref 0.0–0.4)
Eos: 1 %
Hematocrit: 39.5 % (ref 34.0–46.6)
Hemoglobin: 12.6 g/dL (ref 11.1–15.9)
Immature Grans (Abs): 0.1 10*3/uL (ref 0.0–0.1)
Immature Granulocytes: 1 %
Lymphocytes Absolute: 1.1 10*3/uL (ref 0.7–3.1)
Lymphs: 18 %
MCH: 31 pg (ref 26.6–33.0)
MCHC: 31.9 g/dL (ref 31.5–35.7)
MCV: 97 fL (ref 79–97)
Monocytes Absolute: 0.5 10*3/uL (ref 0.1–0.9)
Monocytes: 8 %
Neutrophils Absolute: 4.4 10*3/uL (ref 1.4–7.0)
Neutrophils: 71 %
Platelets: 209 10*3/uL (ref 150–450)
RBC: 4.07 x10E6/uL (ref 3.77–5.28)
RDW: 13.2 % (ref 11.7–15.4)
WBC: 6.2 10*3/uL (ref 3.4–10.8)

## 2022-05-06 LAB — BMP8+EGFR
BUN/Creatinine Ratio: 18 (ref 12–28)
BUN: 20 mg/dL (ref 10–36)
CO2: 21 mmol/L (ref 20–29)
Calcium: 9.6 mg/dL (ref 8.7–10.3)
Chloride: 104 mmol/L (ref 96–106)
Creatinine, Ser: 1.1 mg/dL — ABNORMAL HIGH (ref 0.57–1.00)
Glucose: 95 mg/dL (ref 70–99)
Potassium: 4.2 mmol/L (ref 3.5–5.2)
Sodium: 142 mmol/L (ref 134–144)
eGFR: 47 mL/min/{1.73_m2} — ABNORMAL LOW (ref >59)

## 2022-05-06 LAB — COVID-19, FLU A+B AND RSV
Influenza A, NAA: NOT DETECTED
Influenza B, NAA: NOT DETECTED
RSV, NAA: NOT DETECTED
SARS-CoV-2, NAA: NOT DETECTED

## 2022-05-06 NOTE — Progress Notes (Signed)
Hello Finola,  Your lab result is normal and/or stable.Some minor variations that are not significant are commonly marked abnormal, but do not represent any medical problem for you.  Best regards, [name], M.D.

## 2022-05-07 ENCOUNTER — Ambulatory Visit: Payer: Medicare HMO | Admitting: Cardiovascular Disease

## 2022-05-11 NOTE — Progress Notes (Unsigned)
Subjective:   [name] is a 89 y.o. female who presents for Medicare Annual (Subsequent) preventive examination.  Review of Systems    ***       Objective:    There were no vitals filed for this visit. There is no height or weight on file to calculate BMI.     05/09/2021   10:00 AM 09/28/2020    1:00 AM 05/08/2020    9:18 AM 08/24/2018    3:38 PM 06/01/2017   11:16 AM 02/23/2017    9:49 AM 01/12/2017    9:11 AM  Advanced Directives  Does Patient Have a Medical Advance Directive? Yes Yes Yes No No No Yes  Type of Paramedic of [place];Living will Healthcare Power of [place];Living will    Living will  Does patient want to make changes to medical advance directive?  Yes (Inpatient - patient defers changing a medical advance directive and declines information at this time) No - Patient declined   No - Patient declined No - Patient declined  Copy of [place] in Chart? No - copy requested No - copy requested No - copy requested      Would patient like information on creating a medical advance directive?    Yes (MAU/Ambulatory/Procedural Areas - Information given) No - Patient declined      Current Medications (verified) Outpatient Encounter Medications as of 05/12/2022  Medication Sig   amiodarone (PACERONE) 200 MG tablet Take one tablet by mouth daily   apixaban (ELIQUIS) 2.5 MG TABS tablet Take 1 tablet (2.5 mg total) by mouth 2 (two) times daily.   atorvastatin (LIPITOR) 10 MG tablet Take 1 tablet (10 mg total) by mouth daily.   Biotin 1000 MCG tablet Take 1,000 mcg by mouth daily.   cefUROXime (CEFTIN) 250 MG tablet Take 1 tablet (250 mg total) by mouth 2 (two) times daily with a meal for 10 days.   cetirizine (ZYRTEC) 5 MG tablet Take 1 tablet (5 mg total) by mouth daily.   Cholecalciferol (VITAMIN D3) 10000 UNITS capsule Take 10,000 Units by mouth daily.     fluorouracil (EFUDEX) 5 % cream Apply  topically as needed.   metoprolol succinate (TOPROL-XL) 25 MG 24 hr tablet Take 1 tablet by mouth once daily   olmesartan (BENICAR) 40 MG tablet Take 1 tablet (40 mg total) by mouth daily. For blood pressure   No facility-administered encounter medications on file as of 05/12/2022.    Allergies (verified) Donepezil, Actonel [risedronate sodium], Aspirin-caffeine, Codeine, Memantine, Penicillins, and Sulfonamide derivatives   History: Past Medical History:  Diagnosis Date   Arthritis    Atrial fibrillation ([place])    postoperative   COVID-19    CVA (cerebral vascular accident) ([place])    HTN (hypertension)    MR (mitral regurgitation)    severe   NSTEMI (non-ST elevated myocardial infarction) ([place])    Orthostatic hypotension 11/06/2011   Pericarditis    postoperative   Past Surgical History:  Procedure Laterality Date   ANTERIOR AND POSTERIOR VAGINAL REPAIR  10/15/04   Dr. Nori Riis    anterior repair with [place]igee graft  04/16/06   posterior repair with apogee graft; lynx mid urethral sling; sacrospinous ligamnet suspension and cystoscopy; Dr. Joya Martyr   MITRAL VALVE REPLACEMENT     sacral spinous ligament suspension of vagina     suprapubic cystectomy     TOTAL ABDOMINAL HYSTERECTOMY W/ BILATERAL SALPINGOOPHORECTOMY     Family History  Problem Relation Age of Onset   Heart disease Father    Lupus Father    Heart attack Father    Heart disease Mother    Colon cancer Brother    Heart disease Brother    Social History   Socioeconomic History   Marital status: Widowed    Spouse name: Not on file   Number of children: 3   Years of education: Not on file   Highest education level: 9th grade  Occupational History   Occupation: Retired  Tobacco Use   Smoking status: Never   Smokeless tobacco: Never  Vaping Use   Vaping Use: Never used  Substance and Sexual Activity   Alcohol use: No   Drug use: No   Sexual activity: Not on file  Other Topics Concern   Not on file  Social  History Narrative   Lives with son -  2 daughters live nearby and visit frequently, they help with grocery shopping, etc   Right Handed    Drinks 2-3  cups caffeine daily   Social Determinants of Health   Financial Resource Strain: Low Risk  (05/07/2020)   Overall Financial Resource Strain (CARDIA)    Difficulty of Paying Living Expenses: Not hard at all  Food Insecurity: No Food Insecurity (05/07/2020)   Hunger Vital Sign    Worried About Running Out of Food in the Last Year: Never true    [place] in the Last Year: Never true  Transportation Needs: No Transportation Needs (05/07/2020)   PRAPARE - Hydrologist (Medical): No    Lack of Transportation (Non-Medical): No  Physical Activity: Insufficiently Active (05/09/2021)   Exercise Vital Sign    Days of Exercise per Week: 7 days    Minutes of Exercise per Session: 10 min  Stress: Stress Concern Present (05/09/2021)   [place]    Feeling of Stress : To some extent  Social Connections: Moderately Integrated (05/09/2021)   Social Connection and Isolation Panel [NHANES]    Frequency of Communication with Friends and Family: More than three times a week    Frequency of Social Gatherings with Friends and Family: More than three times a week    Attends Religious Services: More than 4 times per year    Active Member of [employer] or Organizations: Yes    Attends Archivist Meetings: More than 4 times per year    Marital Status: Widowed    Tobacco Counseling Counseling given: Not Answered   Clinical Intake:                 Diabetic?No          Activities of Daily Living     No data to display          Patient Care Team: [name], MD as PCP - General (Family Medicine) Josue Hector, MD as PCP - Cardiology (Cardiology) Josue Hector, MD as Consulting Physician (Cardiology) Garvin Fila, MD as  Consulting Physician (Neurology) Rigoberto Noel, MD as Consulting Physician (Pulmonary Disease)  Indicate any recent Medical Services you may have received from other than [facility] providers in the past year (date may be approximate).     Assessment:   This is a routine wellness examination for [name].  Hearing/Vision screen No results found.  Dietary issues and exercise activities discussed:     Goals Addressed   None    Depression  Screen    05/05/2022   10:28 AM 05/01/2022    2:00 PM 01/07/2022   11:25 AM 08/08/2021    3:58 PM 05/09/2021    9:58 AM 05/08/2021    4:50 PM 02/04/2021    1:09 PM  PHQ 2/9 Scores  PHQ - 2 Score 0 0 0 0 0 0 0  PHQ- 9 Score  0   '4 2 4    '$ Fall Risk    05/05/2022   10:28 AM 05/01/2022    2:00 PM 01/07/2022   11:25 AM 08/08/2021    3:58 PM 05/09/2021    9:56 AM  Fall Risk   Falls in the past year? 0 0 0 0 0  Number falls in past yr:  0   0  Injury with Fall?  0   0  Risk for fall due to :  No Fall Risks   Impaired balance/gait;Orthopedic patient;Mental status change  Follow up  Falls evaluation completed   Education provided;Falls prevention discussed    FALL RISK PREVENTION PERTAINING TO THE HOME:  Any stairs in or around the home? {YES/NO:21197} If so, are there any without handrails? {YES/NO:21197} Home free of loose throw rugs in walkways, pet beds, electrical cords, etc? {YES/NO:21197} Adequate lighting in your home to reduce risk of falls? {YES/NO:21197}  ASSISTIVE DEVICES UTILIZED TO PREVENT FALLS:  Life alert? {YES/NO:21197} Use of a cane, walker or w/c? {YES/NO:21197} Grab bars in the bathroom? {YES/NO:21197} Shower chair or bench in shower? {YES/NO:21197} Elevated toilet seat or a handicapped toilet? {YES/NO:21197}  TIMED UP AND GO:  Was the test performed? No . Telephonic visit   Cognitive Function:    07/09/2020    9:58 AM  MMSE - Mini Mental State Exam  Orientation to time 5  Orientation to Place 5  Registration 3  Attention/  Calculation 0  Recall 2  Language- name 2 objects 2  Language- repeat 1  Language- follow 3 step command 3  Language- read & follow direction 1  Write a sentence 1  Copy design 1  Total score 24        05/09/2021   10:08 AM 05/08/2020    9:20 AM 08/24/2018    3:39 PM  6CIT Screen  What Year? 0 points 0 points 0 points  What month? 0 points 0 points 0 points  What time? 0 points 0 points 0 points  Count back from 20 0 points 0 points 0 points  Months in reverse 2 points 0 points 0 points  Repeat phrase 2 points 0 points 0 points  Total Score 4 points 0 points 0 points    Immunizations Immunization History  Administered Date(s) Administered   Fluad Quad(high Dose 65+) 12/13/2018, 01/31/2020, 01/15/2021, 01/07/2022   Influenza, High Dose Seasonal PF 01/22/2016, 01/06/2017, 02/09/2018   Influenza,inj,Quad PF,6+ Mos 12/27/2012, 12/06/2013, 03/16/2015   Influenza-Unspecified 01/02/2009, 01/18/2010, 02/14/2011   Moderna Sars-Covid-2 Vaccination 05/11/2019, 06/08/2019, 03/09/2020   Pneumococcal Conjugate-13 12/06/2013   Pneumococcal Polysaccharide-23 04/05/2019   Td 06/15/2017    TDAP status: Due, Education has been provided regarding the importance of this vaccine. Advised may receive this vaccine at local pharmacy or Health Dept. Aware to provide a copy of the vaccination record if obtained from local pharmacy or Health Dept. Verbalized acceptance and understanding.  Flu Vaccine status: Up to date  Pneumococcal vaccine status: Up to date  Covid-19 vaccine status: Information provided on how to obtain vaccines.   Qualifies for Shingles Vaccine? Yes   Zostavax completed  No   Shingrix Completed?: No.    Education has been provided regarding the importance of this vaccine. Patient has been advised to call insurance company to determine out of pocket expense if they have not yet received this vaccine. Advised may also receive vaccine at local pharmacy or Health Dept. Verbalized  acceptance and understanding.  Screening Tests Health Maintenance  Topic Date Due   Medicare Annual Wellness (AWV)  05/10/2022   COVID-19 Vaccine (4 - 2023-24 season) 05/21/2022 (Originally 11/08/2021)   Zoster Vaccines- Shingrix (1 of 2) 08/03/2022 (Originally 05/21/1947)   DTaP/Tdap/Td (2 - Tdap) 06/16/2027   Pneumonia Vaccine 87+ Years old  Completed   INFLUENZA VACCINE  Completed   DEXA SCAN  Completed   HPV VACCINES  Aged Out    Health Maintenance  Health Maintenance Due  Topic Date Due   Medicare Annual Wellness (AWV)  05/10/2022    Colorectal cancer screening: No longer required.   Mammogram status: No longer required due to age.  Bone Density status: Completed 04/05/19. Results reflect: Bone density results: OSTEOPOROSIS. Repeat every 2 years.  Lung Cancer Screening: (Low Dose CT Chest recommended if Age 1-80 years, 30 pack-year currently smoking OR have quit w/in 15years.) does not qualify.   Lung Cancer Screening Referral: n/a  Additional Screening:  Hepatitis C Screening: does not qualify  Vision Screening: Recommended annual ophthalmology exams for early detection of glaucoma and other disorders of the eye. Is the patient up to date with their annual eye exam?  {YES/NO:21197} Who is the provider or what is the name of the office in which the patient attends annual eye exams? *** If pt is not established with a provider, would they like to be referred to a provider to establish care? {YES/NO:21197}.   Dental Screening: Recommended annual dental exams for proper oral hygiene  Community Resource Referral / Chronic Care Management: CRR required this visit?  {YES/NO:21197}  CCM required this visit?  {YES/NO:21197}     Plan:     I have personally reviewed and noted the following in the patient's chart:   Medical and social history Use of alcohol, tobacco or illicit drugs  Current medications and supplements including opioid prescriptions. {Opioid  Prescriptions:[phone]} Functional ability and status Nutritional status Physical activity Advanced directives List of other physicians Hospitalizations, surgeries, and ER visits in previous 12 months Vitals Screenings to include cognitive, depression, and falls Referrals and appointments  In addition, I have reviewed and discussed with patient certain preventive protocols, quality metrics, and best practice recommendations. A written personalized care plan for preventive services as well as general preventive health recommendations were provided to patient.     Vanetta Mulders, [place]   [id number]   Due to this being a virtual visit, the after visit summary with patients personalized plan was offered to patient via mail or my-chart. ***Patient declined at this time./ Patient would like to access on my-chart/ per request, patient was mailed a copy of AVS./ Patient preferred to pick up at office at next visit   Nurse Notes: ***

## 2022-05-11 NOTE — Patient Instructions (Signed)
[name] , Thank you for taking time to come for your Medicare Wellness Visit. I appreciate your ongoing commitment to your health goals. Please review the following plan we discussed and let me know if I can assist you in the future.   These are the goals we discussed:  Goals      Exercise 3x per week (30 min per time)     Try to exercise for at least 82mns, 3 times weekly     Remain active and independent        This is a list of the screening recommended for you and due dates:  Health Maintenance  Topic Date Due   COVID-19 Vaccine (4 - 2023-24 season) 05/21/2022*   Zoster (Shingles) Vaccine (1 of 2) 08/03/2022*   Medicare Annual Wellness Visit  05/12/2023   DTaP/Tdap/Td vaccine (2 - Tdap) 06/16/2027   Pneumonia Vaccine  Completed   Flu Shot  Completed   DEXA scan (bone density measurement)  Completed   HPV Vaccine  Aged Out  *Topic was postponed. The date shown is not the original due date.    Advanced directives: Please bring a copy of your health care power of attorney and living will to the office to be added to your chart at your convenience.   Conditions/risks identified: Aim for 30 minutes of exercise or brisk walking, 6-8 glasses of water, and 5 servings of fruits and vegetables each day.   Next appointment: Follow up in one year for your annual wellness visit    Preventive Care 65 Years and Older, Female Preventive care refers to lifestyle choices and visits with your health care provider that can promote health and wellness. What does preventive care include? A yearly physical exam. This is also called an annual well check. Dental exams once or twice a year. Routine eye exams. Ask your health care provider how often you should have your eyes checked. Personal lifestyle choices, including: Daily care of your teeth and gums. Regular physical activity. Eating a healthy diet. Avoiding tobacco and drug use. Limiting alcohol use. Practicing safe sex. Taking  low-dose aspirin every day. Taking vitamin and mineral supplements as recommended by your health care provider. What happens during an annual well check? The services and screenings done by your health care provider during your annual well check will depend on your age, overall health, lifestyle risk factors, and family history of disease. Counseling  Your health care provider may ask you questions about your: Alcohol use. Tobacco use. Drug use. Emotional well-being. Home and relationship well-being. Sexual activity. Eating habits. History of falls. Memory and ability to understand (cognition). Work and work eStatistician Reproductive health. Screening  You may have the following tests or measurements: Height, weight, and BMI. Blood pressure. Lipid and cholesterol levels. These may be checked every 5 years, or more frequently if you are over 520years old. Skin check. Lung cancer screening. You may have this screening every year starting at age 149if you have a 30-pack-year history of smoking and currently smoke or have quit within the past 15 years. Fecal occult blood test (FOBT) of the stool. You may have this test every year starting at age 87 Flexible sigmoidoscopy or colonoscopy. You may have a sigmoidoscopy every 5 years or a colonoscopy every 10 years starting at age 87 Hepatitis C blood test. Hepatitis B blood test. Sexually transmitted disease (STD) testing. Diabetes screening. This is done by checking your blood sugar (glucose) after you have not eaten for  a while (fasting). You may have this done every 1-3 years. Bone density scan. This is done to screen for osteoporosis. You may have this done starting at age 57. Mammogram. This may be done every 1-2 years. Talk to your health care provider about how often you should have regular mammograms. Talk with your health care provider about your test results, treatment options, and if necessary, the need for more tests. Vaccines   Your health care provider may recommend certain vaccines, such as: Influenza vaccine. This is recommended every year. Tetanus, diphtheria, and acellular pertussis (Tdap, Td) vaccine. You may need a Td booster every 10 years. Zoster vaccine. You may need this after age 19. Pneumococcal 13-valent conjugate (PCV13) vaccine. One dose is recommended after age 62. Pneumococcal polysaccharide (PPSV23) vaccine. One dose is recommended after age 12. Talk to your health care provider about which screenings and vaccines you need and how often you need them. This information is not intended to replace advice given to you by your health care provider. Make sure you discuss any questions you have with your health care provider. Document Released: 03/23/2015 Document Revised: 11/14/2015 Document Reviewed: 12/26/2014 Elsevier Interactive Patient Education  2017 [place] Prevention in the Home Falls can cause injuries. They can happen to people of all ages. There are many things you can do to make your home safe and to help prevent falls. What can I do on the outside of my home? Regularly fix the edges of walkways and driveways and fix any cracks. Remove anything that might make you trip as you walk through a door, such as a raised step or threshold. Trim any bushes or trees on the path to your home. Use bright outdoor lighting. Clear any walking paths of anything that might make someone trip, such as rocks or tools. Regularly check to see if handrails are loose or broken. Make sure that both sides of any steps have handrails. Any raised decks and porches should have guardrails on the edges. Have any leaves, snow, or ice cleared regularly. Use sand or salt on walking paths during winter. Clean up any spills in your garage right away. This includes oil or grease spills. What can I do in the bathroom? Use night lights. Install grab bars by the toilet and in the tub and shower. Do not use towel  bars as grab bars. Use non-skid mats or decals in the tub or shower. If you need to sit down in the shower, use a plastic, non-slip stool. Keep the floor dry. Clean up any water that spills on the floor as soon as it happens. Remove soap buildup in the tub or shower regularly. Attach bath mats securely with double-sided non-slip rug tape. Do not have throw rugs and other things on the floor that can make you trip. What can I do in the bedroom? Use night lights. Make sure that you have a light by your bed that is easy to reach. Do not use any sheets or blankets that are too big for your bed. They should not hang down onto the floor. Have a firm chair that has side arms. You can use this for support while you get dressed. Do not have throw rugs and other things on the floor that can make you trip. What can I do in the kitchen? Clean up any spills right away. Avoid walking on wet floors. Keep items that you use a lot in easy-to-reach places. If you need to reach something above  you, use a strong step stool that has a grab bar. Keep electrical cords out of the way. Do not use floor polish or wax that makes floors slippery. If you must use wax, use non-skid floor wax. Do not have throw rugs and other things on the floor that can make you trip. What can I do with my stairs? Do not leave any items on the stairs. Make sure that there are handrails on both sides of the stairs and use them. Fix handrails that are broken or loose. Make sure that handrails are as long as the stairways. Check any carpeting to make sure that it is firmly attached to the stairs. Fix any carpet that is loose or worn. Avoid having throw rugs at the top or bottom of the stairs. If you do have throw rugs, attach them to the floor with carpet tape. Make sure that you have a light switch at the top of the stairs and the bottom of the stairs. If you do not have them, ask someone to add them for you. What else can I do to help  prevent falls? Wear shoes that: Do not have high heels. Have rubber bottoms. Are comfortable and fit you well. Are closed at the toe. Do not wear sandals. If you use a stepladder: Make sure that it is fully opened. Do not climb a closed stepladder. Make sure that both sides of the stepladder are locked into place. Ask someone to hold it for you, if possible. Clearly mark and make sure that you can see: Any grab bars or handrails. First and last steps. Where the edge of each step is. Use tools that help you move around (mobility aids) if they are needed. These include: Canes. Walkers. Scooters. Crutches. Turn on the lights when you go into a dark area. Replace any light bulbs as soon as they burn out. Set up your furniture so you have a clear path. Avoid moving your furniture around. If any of your floors are uneven, fix them. If there are any pets around you, be aware of where they are. Review your medicines with your doctor. Some medicines can make you feel dizzy. This can increase your chance of falling. Ask your doctor what other things that you can do to help prevent falls. This information is not intended to replace advice given to you by your health care provider. Make sure you discuss any questions you have with your health care provider. Document Released: 12/21/2008 Document Revised: 08/02/2015 Document Reviewed: 03/31/2014 Elsevier Interactive Patient Education  2017 [employer].

## 2022-05-12 ENCOUNTER — Telehealth: Payer: Self-pay

## 2022-05-12 ENCOUNTER — Encounter: Payer: Self-pay | Admitting: Cardiovascular Disease

## 2022-05-12 ENCOUNTER — Ambulatory Visit (INDEPENDENT_AMBULATORY_CARE_PROVIDER_SITE_OTHER): Payer: Medicare HMO

## 2022-05-12 VITALS — Ht 63.5 in | Wt 107.0 lb

## 2022-05-12 DIAGNOSIS — Z0000 Encounter for general adult medical examination without abnormal findings: Secondary | ICD-10-CM

## 2022-05-12 NOTE — Telephone Encounter (Signed)
error 

## 2022-05-12 NOTE — Telephone Encounter (Signed)
Patient seen for AWV and is complaining of problems with constipation x 2 weeks.  Would like to know if there is anything in particular OTC that she can take.

## 2022-05-12 NOTE — Telephone Encounter (Signed)
Miralax once or twice daily along with colace100 mg BID both are OTC

## 2022-05-12 NOTE — Telephone Encounter (Signed)
Patient aware.

## 2022-05-14 ENCOUNTER — Telehealth: Payer: Self-pay | Admitting: Family Medicine

## 2022-05-14 IMAGING — DX DG CHEST 1V PORT
1 series · 1 of 1 positions shown · non-contrast
Comparison: Chest radiograph dated 09/26/2020.

CLINICAL DATA: Status post peripherally inserted central venous
catheter (PICC) placement.

EXAM:
PORTABLE CHEST 1 VIEW

[chest ap]
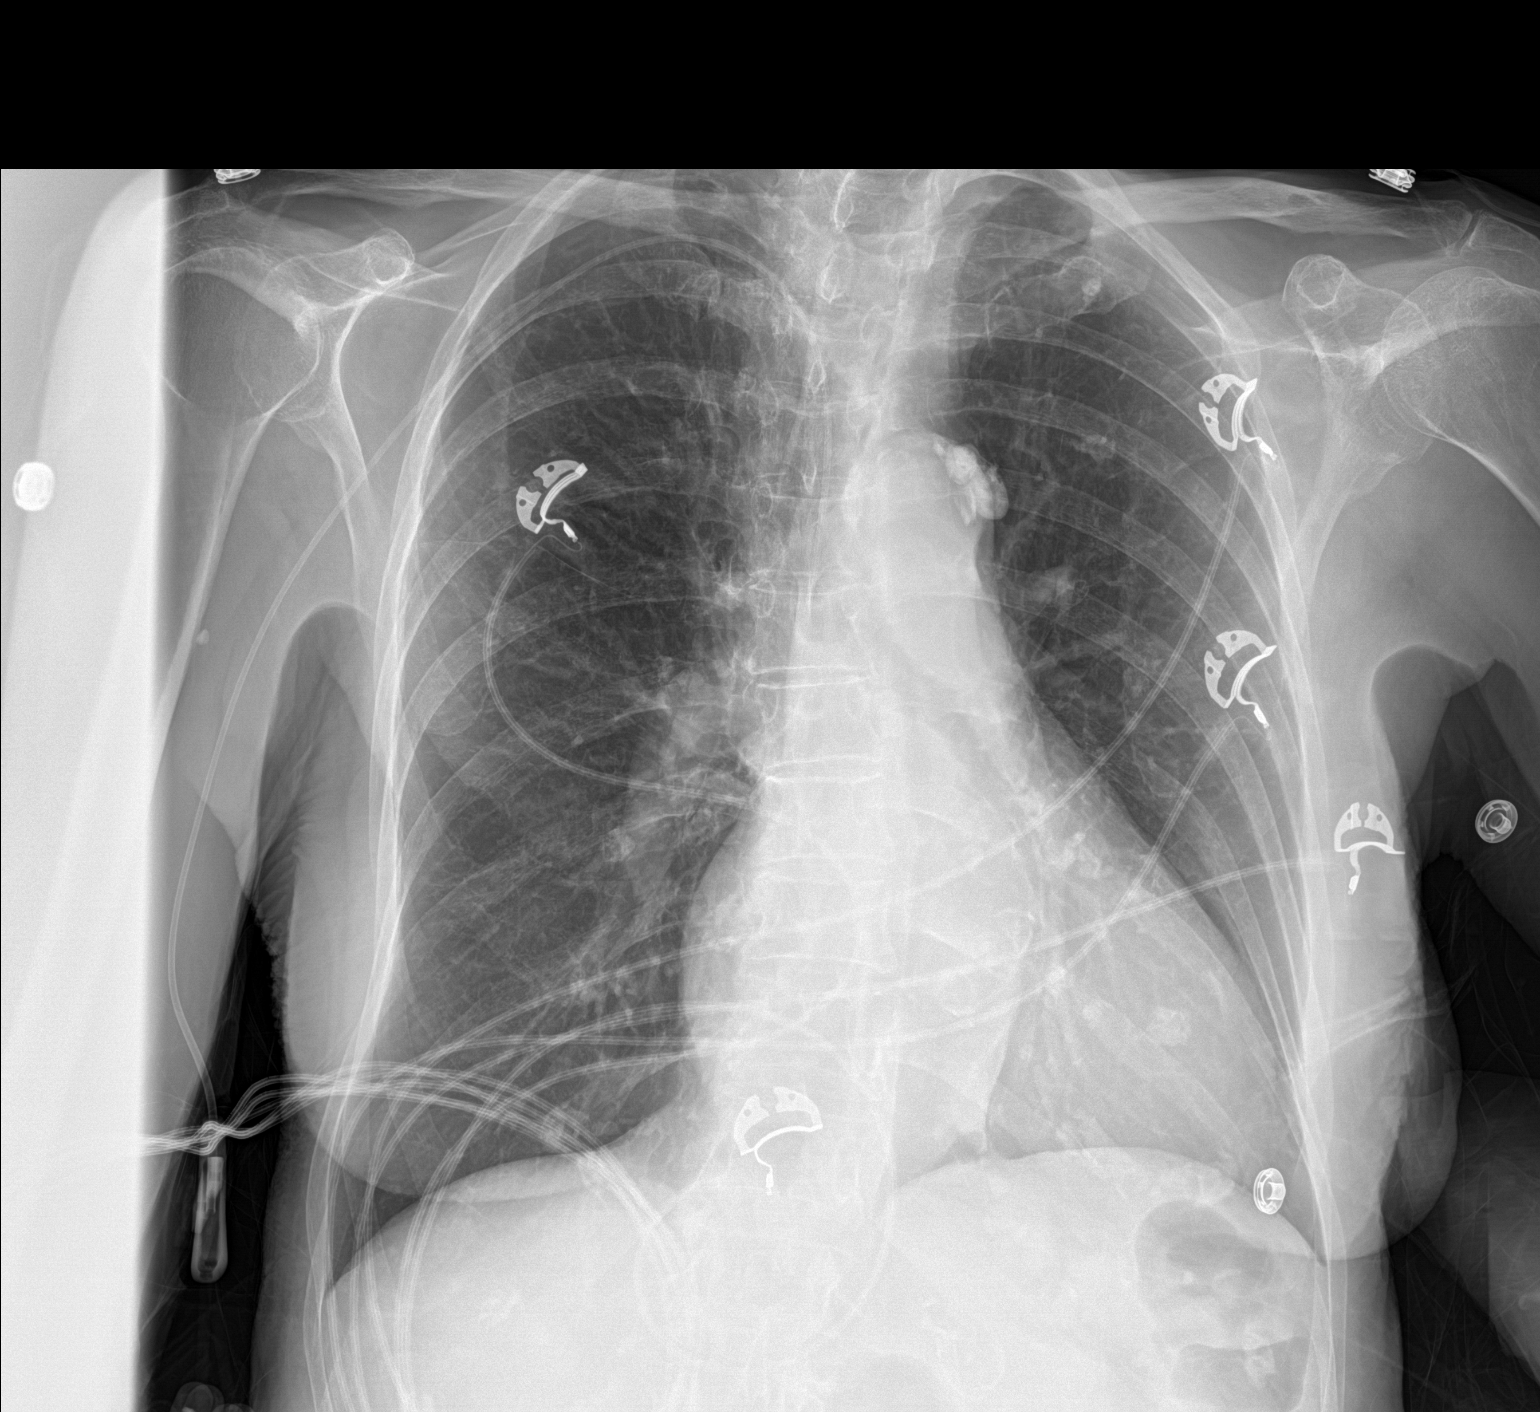

[1 of 1 positions shown; findings below may reference images not displayed]

FINDINGS: The heart is enlarged. Vascular calcifications are seen in the
aortic arch. The lungs are clear. There is no pneumothorax.
Degenerative changes are seen in the spine. A right upper extremity
PICC tip overlies the superior vena cava.
IMPRESSION: Right upper extremity PICC with tip overlying the superior vena
cava. No pneumothorax.

## 2022-05-14 NOTE — Telephone Encounter (Signed)
SPOKE WITH DAUGHTER TERESA, APPOINTMENT SCHEDULED FOR TOMORROW

## 2022-05-14 NOTE — Telephone Encounter (Signed)
  Incoming Patient Call  05/14/2022  What symptoms do you have? Swelling in both legs.Daughter wants to talk to nurse.Told daughter she would need to be seen.  How long have you been sick? Started today.  Have you been seen for this problem? No  If your provider decides to give you a prescription, which pharmacy would you like for it to be sent to? [facility] in [place]   Patient informed that this information will be sent to the clinical staff for review and that they should receive a follow up call.

## 2022-05-15 ENCOUNTER — Encounter: Payer: Self-pay | Admitting: Family Medicine

## 2022-05-15 ENCOUNTER — Ambulatory Visit (INDEPENDENT_AMBULATORY_CARE_PROVIDER_SITE_OTHER): Payer: Medicare HMO

## 2022-05-15 ENCOUNTER — Ambulatory Visit (INDEPENDENT_AMBULATORY_CARE_PROVIDER_SITE_OTHER): Payer: Medicare HMO | Admitting: Family Medicine

## 2022-05-15 VITALS — BP 141/70 | HR 65 | Temp 97.9°F | Ht 63.5 in | Wt 107.0 lb

## 2022-05-15 DIAGNOSIS — I517 Cardiomegaly: Secondary | ICD-10-CM

## 2022-05-15 DIAGNOSIS — J90 Pleural effusion, not elsewhere classified: Secondary | ICD-10-CM | POA: Diagnosis not present

## 2022-05-15 DIAGNOSIS — R600 Localized edema: Secondary | ICD-10-CM | POA: Diagnosis not present

## 2022-05-15 DIAGNOSIS — R0602 Shortness of breath: Secondary | ICD-10-CM | POA: Diagnosis not present

## 2022-05-15 MED ORDER — FUROSEMIDE 20 MG PO TABS: 20.0000 mg | ORAL_TABLET | Freq: Every day | ORAL | 3 refills | Status: DC

## 2022-05-15 NOTE — Progress Notes (Signed)
Subjective:  Patient ID: [name], female    DOB: 03-29-1928  Age: 89 y.o. MRN: [id number]  CC: Edema   HPI [name] presents for three days of swelling of the feet. Trying to keep them elevated. Denies dyspnea.       05/15/2022    2:53 PM 05/12/2022   10:59 AM 05/05/2022   10:28 AM  Depression screen PHQ 2/9  Decreased Interest 0 0 0  Down, Depressed, Hopeless 0 0 0  PHQ - 2 Score 0 0 0    History [name] has a past medical history of Arthritis, Atrial fibrillation ([place]), COVID-19, CVA (cerebral vascular accident) ([place]), HTN (hypertension), MR (mitral regurgitation), NSTEMI (non-ST elevated myocardial infarction) ([place]), Orthostatic hypotension (11/06/2011), and Pericarditis.   She has a past surgical history that includes anterior repair with perigee graft (04/16/06); Anterior and posterior vaginal repair (10/15/04); sacral spinous ligament suspension of vagina; suprapubic cystectomy; Total abdominal hysterectomy w/ bilateral salpingoophorectomy; and Mitral valve replacement.   Her family history includes Colon cancer in her brother; Heart attack in her father; Heart disease in her brother, father, and mother; Lupus in her father.She reports that she has never smoked. She has never used smokeless tobacco. She reports that she does not drink alcohol and does not use drugs.    ROS Review of Systems  Constitutional: Negative.   HENT: Negative.    Eyes:  Negative for visual disturbance.  Respiratory:  Negative for shortness of breath.   Cardiovascular:  Negative for chest pain.  Gastrointestinal:  Negative for abdominal pain.  Musculoskeletal:  Negative for arthralgias.    Objective:  BP (!) 141/70   Pulse 65   Temp 97.9 F (36.6 C)   Ht 5' 3.5" (1.613 m)   Wt 107 lb (48.5 kg)   SpO2 95%   BMI 18.66 kg/m   BP Readings from Last 3 Encounters:  05/18/22 (!) 149/67  05/15/22 (!) 141/70  05/05/22 (!) 159/69    Wt Readings from Last 3 Encounters:  05/18/22 114 lb 10.2  oz (52 kg)  05/15/22 107 lb (48.5 kg)  05/12/22 107 lb (48.5 kg)     Physical Exam Constitutional:      General: She is not in acute distress.    Appearance: She is well-developed.  Cardiovascular:     Rate and Rhythm: Normal rate and regular rhythm.  Pulmonary:     Breath sounds: Normal breath sounds.  Musculoskeletal:        General: Normal range of motion.     Right lower leg: Edema (2+ at foot) present.     Left lower leg: Edema (2+) present.  Skin:    General: Skin is warm and dry.  Neurological:     Mental Status: She is alert and oriented to person, place, and time.    CXR - cardiomagaly   Assessment & Plan:   [name] was seen today for edema.  Diagnoses and all orders for this visit:  Cardiomegaly -     CMP14+EGFR -     Brain natriuretic peptide -     DG Chest 2 View; Future  Edema of both feet -     CMP14+EGFR -     Brain natriuretic peptide -     DG Chest 2 View; Future  Other orders -     furosemide (LASIX) 20 MG tablet; Take 1 tablet (20 mg total) by mouth daily.       I have discontinued Lai M. Petre "[name]"'s  cefUROXime. I am also having her start on furosemide. Additionally, I am having her maintain her Biotin, cetirizine, olmesartan, amiodarone, atorvastatin, apixaban, and metoprolol succinate.  Allergies as of 05/15/2022       Reactions   Donepezil Other (See Comments)   bradycardia   Actonel [risedronate Sodium] Nausea And Vomiting   Aspirin-caffeine    Upset stomach   Codeine    REACTION: upset stomach   Memantine    Penicillins    Sulfonamide Derivatives    REACTION: swollen tongue        Medication List        Accurate as of May 15, 2022 11:59 PM. If you have any questions, ask your nurse or doctor.          STOP taking these medications    cefUROXime 250 MG tablet Commonly known as: CEFTIN Stopped by: [name], MD       TAKE these medications    amiodarone 200 MG tablet Commonly known as:  PACERONE Take one tablet by mouth daily   apixaban 2.5 MG Tabs tablet Commonly known as: ELIQUIS Take 1 tablet (2.5 mg total) by mouth 2 (two) times daily.   atorvastatin 10 MG tablet Commonly known as: LIPITOR Take 1 tablet (10 mg total) by mouth daily.   Biotin 1000 MCG tablet Take 1,000 mcg by mouth daily.   cetirizine 5 MG tablet Commonly known as: ZYRTEC Take 1 tablet (5 mg total) by mouth daily.   fluorouracil 5 % cream Commonly known as: EFUDEX Apply topically as needed.   furosemide 20 MG tablet Commonly known as: LASIX Take 1 tablet (20 mg total) by mouth daily. Started by: [name], MD   metoprolol succinate 25 MG 24 hr tablet Commonly known as: TOPROL-XL Take 1 tablet by mouth once daily   olmesartan 40 MG tablet Commonly known as: Benicar Take 1 tablet (40 mg total) by mouth daily. For blood pressure   Vitamin D3 250 MCG (10000 UT) Caps Generic drug: Cholecalciferol Take 10,000 Units by mouth daily.         Follow-up: Return in about 2 weeks (around 05/29/2022).  [name], M.D.

## 2022-05-16 LAB — CMP14+EGFR
ALT: 35 IU/L — ABNORMAL HIGH (ref 0–32)
AST: 31 IU/L (ref 0–40)
Albumin/Globulin Ratio: 1.4 (ref 1.2–2.2)
Albumin: 3.7 g/dL (ref 3.6–4.6)
Alkaline Phosphatase: 105 IU/L (ref 44–121)
BUN/Creatinine Ratio: 15 (ref 12–28)
BUN: 16 mg/dL (ref 10–36)
Bilirubin Total: 0.7 mg/dL (ref 0.0–1.2)
CO2: 24 mmol/L (ref 20–29)
Calcium: 9 mg/dL (ref 8.7–10.3)
Chloride: 102 mmol/L (ref 96–106)
Creatinine, Ser: 1.1 mg/dL — ABNORMAL HIGH (ref 0.57–1.00)
Globulin, Total: 2.6 g/dL (ref 1.5–4.5)
Glucose: 102 mg/dL — ABNORMAL HIGH (ref 70–99)
Potassium: 3.9 mmol/L (ref 3.5–5.2)
Sodium: 140 mmol/L (ref 134–144)
Total Protein: 6.3 g/dL (ref 6.0–8.5)
eGFR: 47 mL/min/{1.73_m2} — ABNORMAL LOW (ref >59)

## 2022-05-16 LAB — BRAIN NATRIURETIC PEPTIDE: BNP: 1569.1 pg/mL — ABNORMAL HIGH (ref 0.0–100.0)

## 2022-05-17 ENCOUNTER — Inpatient Hospital Stay (HOSPITAL_COMMUNITY)
Admission: EM | Admit: 2022-05-17 | Discharge: 2022-05-20 | DRG: 291 | Disposition: A | Discharge status: Home / Self Care | Payer: Medicare HMO | Attending: Internal Medicine | Admitting: Internal Medicine

## 2022-05-17 ENCOUNTER — Other Ambulatory Visit: Payer: Self-pay

## 2022-05-17 ENCOUNTER — Encounter (HOSPITAL_COMMUNITY): Payer: Self-pay

## 2022-05-17 ENCOUNTER — Emergency Department (HOSPITAL_COMMUNITY): Payer: Medicare HMO

## 2022-05-17 DIAGNOSIS — E785 Hyperlipidemia, unspecified: Secondary | ICD-10-CM | POA: Diagnosis present

## 2022-05-17 DIAGNOSIS — G3184 Mild cognitive impairment, so stated: Secondary | ICD-10-CM

## 2022-05-17 DIAGNOSIS — N1831 Chronic kidney disease, stage 3a: Secondary | ICD-10-CM

## 2022-05-17 DIAGNOSIS — I130 Hypertensive heart and chronic kidney disease with heart failure and stage 1 through stage 4 chronic kidney disease, or unspecified chronic kidney disease: Principal | ICD-10-CM | POA: Diagnosis present

## 2022-05-17 DIAGNOSIS — Z8673 Personal history of transient ischemic attack (TIA), and cerebral infarction without residual deficits: Secondary | ICD-10-CM

## 2022-05-17 DIAGNOSIS — I480 Paroxysmal atrial fibrillation: Secondary | ICD-10-CM | POA: Diagnosis not present

## 2022-05-17 DIAGNOSIS — I429 Cardiomyopathy, unspecified: Secondary | ICD-10-CM | POA: Diagnosis not present

## 2022-05-17 DIAGNOSIS — I509 Heart failure, unspecified: Secondary | ICD-10-CM | POA: Insufficient documentation

## 2022-05-17 DIAGNOSIS — Z79899 Other long term (current) drug therapy: Secondary | ICD-10-CM

## 2022-05-17 DIAGNOSIS — I452 Bifascicular block: Secondary | ICD-10-CM | POA: Diagnosis present

## 2022-05-17 DIAGNOSIS — I2510 Atherosclerotic heart disease of native coronary artery without angina pectoris: Secondary | ICD-10-CM | POA: Diagnosis present

## 2022-05-17 DIAGNOSIS — R0602 Shortness of breath: Secondary | ICD-10-CM | POA: Diagnosis not present

## 2022-05-17 DIAGNOSIS — I7121 Aneurysm of the ascending aorta, without rupture: Secondary | ICD-10-CM | POA: Diagnosis present

## 2022-05-17 DIAGNOSIS — N179 Acute kidney failure, unspecified: Secondary | ICD-10-CM | POA: Diagnosis not present

## 2022-05-17 DIAGNOSIS — I4892 Unspecified atrial flutter: Secondary | ICD-10-CM | POA: Diagnosis present

## 2022-05-17 DIAGNOSIS — J4489 Other specified chronic obstructive pulmonary disease: Secondary | ICD-10-CM | POA: Diagnosis present

## 2022-05-17 DIAGNOSIS — R5383 Other fatigue: Secondary | ICD-10-CM | POA: Diagnosis not present

## 2022-05-17 DIAGNOSIS — I161 Hypertensive emergency: Secondary | ICD-10-CM | POA: Diagnosis not present

## 2022-05-17 DIAGNOSIS — I5021 Acute systolic (congestive) heart failure: Secondary | ICD-10-CM | POA: Diagnosis not present

## 2022-05-17 DIAGNOSIS — R5381 Other malaise: Secondary | ICD-10-CM | POA: Diagnosis not present

## 2022-05-17 DIAGNOSIS — Z66 Do not resuscitate: Secondary | ICD-10-CM | POA: Diagnosis not present

## 2022-05-17 DIAGNOSIS — Z885 Allergy status to narcotic agent status: Secondary | ICD-10-CM | POA: Diagnosis not present

## 2022-05-17 DIAGNOSIS — Z8249 Family history of ischemic heart disease and other diseases of the circulatory system: Secondary | ICD-10-CM

## 2022-05-17 DIAGNOSIS — I252 Old myocardial infarction: Secondary | ICD-10-CM

## 2022-05-17 DIAGNOSIS — J90 Pleural effusion, not elsewhere classified: Secondary | ICD-10-CM | POA: Diagnosis not present

## 2022-05-17 DIAGNOSIS — I272 Pulmonary hypertension, unspecified: Secondary | ICD-10-CM | POA: Diagnosis not present

## 2022-05-17 DIAGNOSIS — Z7901 Long term (current) use of anticoagulants: Secondary | ICD-10-CM | POA: Diagnosis not present

## 2022-05-17 DIAGNOSIS — R0600 Dyspnea, unspecified: Principal | ICD-10-CM

## 2022-05-17 DIAGNOSIS — I10 Essential (primary) hypertension: Secondary | ICD-10-CM | POA: Diagnosis not present

## 2022-05-17 DIAGNOSIS — Z832 Family history of diseases of the blood and blood-forming organs and certain disorders involving the immune mechanism: Secondary | ICD-10-CM

## 2022-05-17 DIAGNOSIS — Z1152 Encounter for screening for COVID-19: Secondary | ICD-10-CM | POA: Diagnosis not present

## 2022-05-17 DIAGNOSIS — I5023 Acute on chronic systolic (congestive) heart failure: Secondary | ICD-10-CM | POA: Diagnosis not present

## 2022-05-17 DIAGNOSIS — I440 Atrioventricular block, first degree: Secondary | ICD-10-CM | POA: Diagnosis not present

## 2022-05-17 DIAGNOSIS — Z880 Allergy status to penicillin: Secondary | ICD-10-CM

## 2022-05-17 DIAGNOSIS — I5022 Chronic systolic (congestive) heart failure: Secondary | ICD-10-CM | POA: Diagnosis present

## 2022-05-17 DIAGNOSIS — Z882 Allergy status to sulfonamides status: Secondary | ICD-10-CM

## 2022-05-17 DIAGNOSIS — N189 Chronic kidney disease, unspecified: Secondary | ICD-10-CM

## 2022-05-17 DIAGNOSIS — I080 Rheumatic disorders of both mitral and aortic valves: Secondary | ICD-10-CM | POA: Diagnosis present

## 2022-05-17 DIAGNOSIS — J811 Chronic pulmonary edema: Secondary | ICD-10-CM | POA: Diagnosis not present

## 2022-05-17 DIAGNOSIS — Z888 Allergy status to other drugs, medicaments and biological substances status: Secondary | ICD-10-CM

## 2022-05-17 DIAGNOSIS — Z886 Allergy status to analgesic agent status: Secondary | ICD-10-CM

## 2022-05-17 DIAGNOSIS — Z800 Family history of malignant neoplasm of digestive organs: Secondary | ICD-10-CM

## 2022-05-17 DIAGNOSIS — R059 Cough, unspecified: Secondary | ICD-10-CM | POA: Diagnosis not present

## 2022-05-17 LAB — BRAIN NATRIURETIC PEPTIDE: B Natriuretic Peptide: 1639 pg/mL — ABNORMAL HIGH (ref 0.0–100.0)

## 2022-05-17 LAB — URINALYSIS, W/ REFLEX TO CULTURE (INFECTION SUSPECTED)
Bacteria, UA: NONE SEEN
Bilirubin Urine: NEGATIVE
Glucose, UA: NEGATIVE mg/dL
Hgb urine dipstick: NEGATIVE
Ketones, ur: 5 mg/dL — AB
Leukocytes,Ua: NEGATIVE
Nitrite: NEGATIVE
Protein, ur: 100 mg/dL — AB
Specific Gravity, Urine: 1.013 (ref 1.005–1.030)
pH: 7 (ref 5.0–8.0)

## 2022-05-17 LAB — CBC WITH DIFFERENTIAL/PLATELET
Abs Immature Granulocytes: 0.03 10*3/uL (ref 0.00–0.07)
Basophils Absolute: 0 10*3/uL (ref 0.0–0.1)
Basophils Relative: 1 %
Eosinophils Absolute: 0.1 10*3/uL (ref 0.0–0.5)
Eosinophils Relative: 2 %
HCT: 36.9 % (ref 36.0–46.0)
Hemoglobin: 11.9 g/dL — ABNORMAL LOW (ref 12.0–15.0)
Immature Granulocytes: 0 %
Lymphocytes Relative: 12 %
Lymphs Abs: 0.9 10*3/uL (ref 0.7–4.0)
MCH: 31.3 pg (ref 26.0–34.0)
MCHC: 32.2 g/dL (ref 30.0–36.0)
MCV: 97.1 fL (ref 80.0–100.0)
Monocytes Absolute: 0.7 10*3/uL (ref 0.1–1.0)
Monocytes Relative: 9 %
Neutro Abs: 5.6 10*3/uL (ref 1.7–7.7)
Neutrophils Relative %: 76 %
Platelets: 260 10*3/uL (ref 150–400)
RBC: 3.8 MIL/uL — ABNORMAL LOW (ref 3.87–5.11)
RDW: 13.6 % (ref 11.5–15.5)
WBC: 7.4 10*3/uL (ref 4.0–10.5)
nRBC: 0 % (ref 0.0–0.2)

## 2022-05-17 LAB — CBG MONITORING, ED: Glucose-Capillary: 102 mg/dL — ABNORMAL HIGH (ref 70–99)

## 2022-05-17 LAB — COMPREHENSIVE METABOLIC PANEL
ALT: 31 U/L (ref 0–44)
AST: 36 U/L (ref 15–41)
Albumin: 2.9 g/dL — ABNORMAL LOW (ref 3.5–5.0)
Alkaline Phosphatase: 89 U/L (ref 38–126)
Anion gap: 12 (ref 5–15)
BUN: 12 mg/dL (ref 8–23)
CO2: 24 mmol/L (ref 22–32)
Calcium: 9 mg/dL (ref 8.9–10.3)
Chloride: 102 mmol/L (ref 98–111)
Creatinine, Ser: 1.16 mg/dL — ABNORMAL HIGH (ref 0.44–1.00)
GFR, Estimated: 45 mL/min — ABNORMAL LOW (ref >60)
Glucose, Bld: 101 mg/dL — ABNORMAL HIGH (ref 70–99)
Potassium: 3.5 mmol/L (ref 3.5–5.1)
Sodium: 138 mmol/L (ref 135–145)
Total Bilirubin: 0.8 mg/dL (ref 0.3–1.2)
Total Protein: 6.4 g/dL — ABNORMAL LOW (ref 6.5–8.1)

## 2022-05-17 LAB — LACTIC ACID, PLASMA
Lactic Acid, Venous: 2 mmol/L (ref 0.5–1.9)
Lactic Acid, Venous: 1.1 mmol/L (ref 0.5–1.9)

## 2022-05-17 LAB — RESP PANEL BY RT-PCR (RSV, FLU A&B, COVID)  RVPGX2
Influenza A by PCR: NEGATIVE
Influenza B by PCR: NEGATIVE
Resp Syncytial Virus by PCR: NEGATIVE
SARS Coronavirus 2 by RT PCR: NEGATIVE

## 2022-05-17 LAB — LIPASE, BLOOD: Lipase: 32 U/L (ref 11–51)

## 2022-05-17 LAB — TROPONIN I (HIGH SENSITIVITY)
Troponin I (High Sensitivity): 27 ng/L — ABNORMAL HIGH (ref <18)
Troponin I (High Sensitivity): 30 ng/L — ABNORMAL HIGH (ref <18)

## 2022-05-17 LAB — MAGNESIUM: Magnesium: 1.9 mg/dL (ref 1.7–2.4)

## 2022-05-17 MED ORDER — FUROSEMIDE 10 MG/ML IJ SOLN
20.0000 mg | Freq: Once | INTRAMUSCULAR | Status: AC
  Administered 2022-05-17: 20 mg via INTRAVENOUS
  Filled 2022-05-17: qty 2

## 2022-05-17 MED ORDER — DIGOXIN 0.25 MG/ML IJ SOLN: 0.1250 mg | Freq: Four times a day (QID) | INTRAMUSCULAR | Status: DC

## 2022-05-17 MED ORDER — AMIODARONE HCL 200 MG PO TABS
200.0000 mg | ORAL_TABLET | Freq: Every day | ORAL | Status: DC
  Administered 2022-05-17 – 2022-05-20 (×4): 200 mg via ORAL
  Filled 2022-05-17 (×4): qty 1

## 2022-05-17 MED ORDER — POTASSIUM CHLORIDE CRYS ER 20 MEQ PO TBCR
40.0000 meq | EXTENDED_RELEASE_TABLET | Freq: Once | ORAL | Status: AC
  Administered 2022-05-17: 40 meq via ORAL
  Filled 2022-05-17: qty 2

## 2022-05-17 MED ORDER — SODIUM CHLORIDE 0.9% FLUSH
3.0000 mL | Freq: Two times a day (BID) | INTRAVENOUS | Status: DC
  Administered 2022-05-17 – 2022-05-20 (×7): 3 mL via INTRAVENOUS

## 2022-05-17 MED ORDER — SODIUM CHLORIDE 0.9 % IV SOLN: 250.0000 mL | INTRAVENOUS | Status: DC | PRN

## 2022-05-17 MED ORDER — IRBESARTAN 300 MG PO TABS
300.0000 mg | ORAL_TABLET | Freq: Every day | ORAL | Status: DC
  Administered 2022-05-17 – 2022-05-18 (×2): 300 mg via ORAL
  Filled 2022-05-17 (×2): qty 1

## 2022-05-17 MED ORDER — ATORVASTATIN CALCIUM 10 MG PO TABS
10.0000 mg | ORAL_TABLET | Freq: Every day | ORAL | Status: DC
  Administered 2022-05-17 – 2022-05-20 (×4): 10 mg via ORAL
  Filled 2022-05-17 (×4): qty 1

## 2022-05-17 MED ORDER — HYDRALAZINE HCL 20 MG/ML IJ SOLN: 2.0000 mg | Freq: Four times a day (QID) | INTRAMUSCULAR | Status: DC | PRN

## 2022-05-17 MED ORDER — APIXABAN 2.5 MG PO TABS
2.5000 mg | ORAL_TABLET | Freq: Two times a day (BID) | ORAL | Status: DC
  Administered 2022-05-17 – 2022-05-20 (×6): 2.5 mg via ORAL
  Filled 2022-05-17 (×6): qty 1

## 2022-05-17 MED ORDER — ONDANSETRON HCL 4 MG/2ML IJ SOLN: 4.0000 mg | Freq: Four times a day (QID) | INTRAMUSCULAR | Status: DC | PRN

## 2022-05-17 MED ORDER — DIGOXIN 0.25 MG/ML IJ SOLN
0.1250 mg | Freq: Once | INTRAMUSCULAR | Status: AC
  Administered 2022-05-17: 0.125 mg via INTRAVENOUS
  Filled 2022-05-17: qty 2

## 2022-05-17 MED ORDER — IOHEXOL 350 MG/ML SOLN
50.0000 mL | Freq: Once | INTRAVENOUS | Status: AC | PRN
  Administered 2022-05-17: 50 mL via INTRAVENOUS

## 2022-05-17 MED ORDER — CARVEDILOL 3.125 MG PO TABS
3.1250 mg | ORAL_TABLET | Freq: Two times a day (BID) | ORAL | Status: DC
  Administered 2022-05-17 – 2022-05-18 (×2): 3.125 mg via ORAL
  Filled 2022-05-17 (×2): qty 1

## 2022-05-17 MED ORDER — LORATADINE 10 MG PO TABS
10.0000 mg | ORAL_TABLET | Freq: Every day | ORAL | Status: DC
  Administered 2022-05-17 – 2022-05-20 (×4): 10 mg via ORAL
  Filled 2022-05-17 (×4): qty 1

## 2022-05-17 MED ORDER — SODIUM CHLORIDE 0.9 % IV BOLUS: 500.0000 mL | Freq: Once | INTRAVENOUS | Status: DC

## 2022-05-17 MED ORDER — SODIUM CHLORIDE 0.9% FLUSH
3.0000 mL | INTRAVENOUS | Status: DC | PRN
  Administered 2022-05-20: 3 mL via INTRAVENOUS

## 2022-05-17 MED ORDER — FUROSEMIDE 10 MG/ML IJ SOLN
20.0000 mg | Freq: Every day | INTRAMUSCULAR | Status: DC
  Administered 2022-05-18: 20 mg via INTRAVENOUS
  Filled 2022-05-17: qty 2

## 2022-05-17 MED ORDER — METOPROLOL TARTRATE 5 MG/5ML IV SOLN
2.5000 mg | Freq: Four times a day (QID) | INTRAVENOUS | Status: DC | PRN
  Administered 2022-05-17: 2.5 mg via INTRAVENOUS
  Filled 2022-05-17: qty 5

## 2022-05-17 MED ORDER — ACETAMINOPHEN 325 MG PO TABS
650.0000 mg | ORAL_TABLET | ORAL | Status: DC | PRN
  Administered 2022-05-18 – 2022-05-19 (×3): 650 mg via ORAL
  Filled 2022-05-17 (×3): qty 2

## 2022-05-17 NOTE — ED Notes (Signed)
Pt given water w/ EDP permission.  

## 2022-05-17 NOTE — ED Notes (Signed)
Pt able to ambulate to restroom and back w/ minimal staff assist.

## 2022-05-17 NOTE — ED Notes (Signed)
Patient transported to CT 

## 2022-05-17 NOTE — ED Notes (Signed)
Lab at bedside to get 2nd set of cultures.

## 2022-05-17 NOTE — Progress Notes (Addendum)
Patient became tachycardia, repeat EKG showed extreme tachycardia with wide-complex QRS, QRS interval 140 MS appears to be chronic.  Telemonitoring in the ED however showed likely uncontrolled A-fib.  Patient has no symptoms.  Blood pressure SBP in 140s, ordered as needed Lopressor and patient received afternoon dose of Coreg.  Given the patient has baseline history of CHF with reduced LVEF, and digoxin loading 0.125 mg every 6 hours x 4 doses.   Escalate to PCU for close monitoring  Pharmacy against digoxin loading, will D/C

## 2022-05-17 NOTE — H&P (Signed)
History and Physical    [name] [id number] DOB: July 02, 1928 DOA: 05/17/2022  PCP: [name], MD (Confirm with patient/family/NH records and if not entered, this has to be entered at [facility] point of entry) Patient coming from: Home  I have personally briefly reviewed patient's old medical records in [place]  Chief Complaint: Cough, SOB and leg swelling  HPI: [name] is a 89 y.o. female with medical history significant of HFrEF chronic LVEF 35-40%, PAF on Eliquis and amiodarone, HTN, moderate pulmonary hypertension, NSTEMI/CAD, presented with worsening of cough or shortness of breath and leg swelling.  Patient's blood pressure medication adjusted recently with 1 medication removed but family cannot tell me exactly which one at this point.  Her symptoms started about 3 weeks ago, with initially a dry cough and exertional dyspnea she went to see PCP and was diagnosed with bronchitis was given breathing treatment with some improvement.  Her symptoms became worse last week, with increasing cough and worsening of exertional dyspnea and new bilateral leg swelling but no chest pains.  She went back to see cardiology yesterday and was found to be fluid overloaded and started on p.o. Lasix.  She took 1 pill of 20 mg Lasix yesterday and " put out a lot of urine and leg swelling was," during daytime however and night she continued to experience shortness of breath and cannot lie flat.  ED Course: Blood pressure elevated 170/100, heart rate 82, afebrile, saturation 94% on room air.  CT angiogram negative for PE but bilateral pulmonary congestion and bilateral small pleural effusion.  20 mg IV Lasix given in the ED.  Review of Systems: As per HPI otherwise 14 point review of systems negative.    Past Medical History:  Diagnosis Date   Arthritis    Atrial fibrillation ([place])    postoperative   COVID-19    CVA (cerebral vascular accident) ([place])    HTN (hypertension)    MR (mitral  regurgitation)    severe   NSTEMI (non-ST elevated myocardial infarction) ([place])    Orthostatic hypotension 11/06/2011   Pericarditis    postoperative    Past Surgical History:  Procedure Laterality Date   ANTERIOR AND POSTERIOR VAGINAL REPAIR  10/15/04   Dr. Nori Riis    anterior repair with perigee graft  04/16/06   posterior repair with apogee graft; lynx mid urethral sling; sacrospinous ligamnet suspension and cystoscopy; Dr. Joya Martyr   MITRAL VALVE REPLACEMENT     sacral spinous ligament suspension of vagina     suprapubic cystectomy     TOTAL ABDOMINAL HYSTERECTOMY W/ BILATERAL SALPINGOOPHORECTOMY       reports that she has never smoked. She has never used smokeless tobacco. She reports that she does not drink alcohol and does not use drugs.  Allergies  Allergen Reactions   Donepezil Other (See Comments)    bradycardia   Actonel [Risedronate Sodium] Nausea And Vomiting   Aspirin-Caffeine     Upset stomach    Codeine     REACTION: upset stomach   Memantine    Penicillins    Sulfonamide Derivatives     REACTION: swollen tongue    Family History  Problem Relation Age of Onset   Heart disease Father    Lupus Father    Heart attack Father    Heart disease Mother    Colon cancer Brother    Heart disease Brother      Prior to Admission medications   Medication Sig Start Date  End Date Taking? Authorizing Provider  amiodarone (PACERONE) 200 MG tablet Take one tablet by mouth daily Patient taking differently: Take 200 mg by mouth daily. Take one tablet by mouth daily 01/08/22  Yes Sherran Needs, NP  apixaban (ELIQUIS) 2.5 MG TABS tablet Take 1 tablet (2.5 mg total) by mouth 2 (two) times daily. 02/19/22  Yes Josue Hector, MD  atorvastatin (LIPITOR) 10 MG tablet Take 1 tablet (10 mg total) by mouth daily. 01/17/22  Yes Josue Hector, MD  Biotin 1000 MCG tablet Take 1,000 mcg by mouth daily.   Yes [provider]  cetirizine (ZYRTEC) 5 MG tablet Take 1 tablet (5  mg total) by mouth daily. 02/08/21  Yes Gwenlyn Perking, FNP  cholecalciferol 25 MCG (1000 UT) tablet Take 1,000 Units by mouth daily.   Yes [provider]  furosemide (LASIX) 20 MG tablet Take 1 tablet (20 mg total) by mouth daily. 05/15/22  Yes [name], MD  metoprolol succinate (TOPROL-XL) 25 MG 24 hr tablet Take 1 tablet by mouth once daily 03/25/22  Yes Josue Hector, MD  olmesartan (BENICAR) 40 MG tablet Take 1 tablet (40 mg total) by mouth daily. For blood pressure Patient not taking: Reported on 05/17/2022 01/07/22   [name], MD    Physical Exam: Vitals:   05/17/22 1300 05/17/22 1330 05/17/22 1357 05/17/22 1400  BP: (!) 151/71 (!) 162/67  (!) 156/79  Pulse: 72 73  72  Resp: (!) 22 (!) 28  (!) 30  Temp:   98.1 F (36.7 C)   TempSrc:   Oral   SpO2: 92% 94%  93%  Weight:      Height:        Constitutional: NAD, calm, comfortable Vitals:   05/17/22 1300 05/17/22 1330 05/17/22 1357 05/17/22 1400  BP: (!) 151/71 (!) 162/67  (!) 156/79  Pulse: 72 73  72  Resp: (!) 22 (!) 28  (!) 30  Temp:   98.1 F (36.7 C)   TempSrc:   Oral   SpO2: 92% 94%  93%  Weight:      Height:       Eyes: PERRL, lids and conjunctivae normal ENMT: Mucous membranes are moist. Posterior pharynx clear of any exudate or lesions.Normal dentition.  Neck: normal, supple, no masses, no thyromegaly Respiratory: clear to auscultation bilaterally, no wheezing, fine crackles on bilateral lower fields, increasing breathing effort.  No accessory muscle use.  Cardiovascular: Regular rate and rhythm, no murmurs / rubs / gallops. 1+ extremity edema. 2+ pedal pulses. No carotid bruits.  Abdomen: no tenderness, no masses palpated. No hepatosplenomegaly. Bowel sounds positive.  Musculoskeletal: no clubbing / cyanosis. No joint deformity upper and lower extremities. Good ROM, no contractures. Normal muscle tone.  Skin: no rashes, lesions, ulcers. No induration Neurologic: CN 2-12 grossly intact.  Sensation intact, DTR normal. Strength 5/5 in all 4.  Psychiatric: Normal judgment and insight. Alert and oriented x 3. Normal mood.   Labs on Admission: I have personally reviewed following labs and imaging studies  CBC: Recent Labs  Lab 05/17/22 0954  WBC 7.4  NEUTROABS 5.6  HGB 11.9*  HCT 36.9  MCV 97.1  PLT 123456   Basic Metabolic Panel: Recent Labs  Lab 05/15/22 1550 05/17/22 0954  NA 140 138  K 3.9 3.5  CL 102 102  CO2 24 24  GLUCOSE 102* 101*  BUN 16 12  CREATININE 1.10* 1.16*  CALCIUM 9.0 9.0   GFR: Estimated Creatinine Clearance: 24.2  mL/min (A) (by C-G formula based on SCr of 1.16 mg/dL (H)). Liver Function Tests: Recent Labs  Lab 05/15/22 1550 05/17/22 0954  AST 31 36  ALT 35* 31  ALKPHOS 105 89  BILITOT 0.7 0.8  PROT 6.3 6.4*  ALBUMIN 3.7 2.9*   Recent Labs  Lab 05/17/22 0954  LIPASE 32   No results for input(s): "AMMONIA" in the last 168 hours. Coagulation Profile: No results for input(s): "INR", "PROTIME" in the last 168 hours. Cardiac Enzymes: No results for input(s): "CKTOTAL", "CKMB", "CKMBINDEX", "TROPONINI" in the last 168 hours. BNP (last 3 results) No results for input(s): "PROBNP" in the last 8760 hours. HbA1C: No results for input(s): "HGBA1C" in the last 72 hours. CBG: Recent Labs  Lab 05/17/22 0957  GLUCAP 102*   Lipid Profile: No results for input(s): "CHOL", "HDL", "LDLCALC", "TRIG", "CHOLHDL", "LDLDIRECT" in the last 72 hours. Thyroid Function Tests: No results for input(s): "TSH", "T4TOTAL", "FREET4", "T3FREE", "THYROIDAB" in the last 72 hours. Anemia Panel: No results for input(s): "VITAMINB12", "FOLATE", "FERRITIN", "TIBC", "IRON", "RETICCTPCT" in the last 72 hours. Urine analysis:    Component Value Date/Time   COLORURINE YELLOW 05/17/2022 [place] 05/17/2022 0938   LABSPEC 1.013 05/17/2022 0938   PHURINE 7.0 05/17/2022 0938   GLUCOSEU NEGATIVE 05/17/2022 0938   HGBUR NEGATIVE 05/17/2022 0938    BILIRUBINUR NEGATIVE 05/17/2022 0938   BILIRUBINUR negative 08/09/2012 1203   KETONESUR 5 (A) 05/17/2022 0938   PROTEINUR 100 (A) 05/17/2022 0938   UROBILINOGEN negative 08/09/2012 1203   NITRITE NEGATIVE 05/17/2022 0938   LEUKOCYTESUR NEGATIVE 05/17/2022 0938    Radiological Exams on Admission: CT Angio Chest PE W and/or Wo Contrast  Result Date: 05/17/2022 CLINICAL DATA:  Fatigue with cough.  PE suspected. EXAM: CT ANGIOGRAPHY CHEST WITH CONTRAST TECHNIQUE: Multidetector CT imaging of the chest was performed using the standard protocol during bolus administration of intravenous contrast. Multiplanar CT image reconstructions and MIPs were obtained to evaluate the vascular anatomy. RADIATION DOSE REDUCTION: This exam was performed according to the departmental dose-optimization program which includes automated exposure control, adjustment of the mA and/or kV according to patient size and/or use of iterative reconstruction technique. CONTRAST:  58m OMNIPAQUE IOHEXOL 350 MG/ML SOLN COMPARISON:  Chest CT 12/11/2021 FINDINGS: Cardiovascular: Heart is enlarged. No substantial pericardial effusion. Coronary artery calcification is evident. Mild atherosclerotic calcification is noted in the wall of the thoracic aorta. Ascending thoracic aorta measures 3.9 cm diameter. There is no filling defect within the opacified pulmonary arteries to suggest the presence of an acute pulmonary embolus. Mediastinum/Nodes: Mild mediastinal lymphadenopathy evident with 10 mm short axis precarinal node on 36/5 and 13 mm short axis subcarinal node on 60/5. There is no hilar lymphadenopathy. The esophagus has normal imaging features. There is no axillary lymphadenopathy. Lungs/Pleura: 16 mm perifissural left lower lobe pulmonary nodule on image 61/6 is new in the interval. Tiny peripheral clustered nodularity in the superior segment left lower lobe may be postobstructive or related to infectious/inflammatory etiology. Patchy  ground-glass attenuation noted in both lungs with scattered additional tiny bilateral pulmonary nodules. Small right and tiny left pleural effusions evident. Upper Abdomen: Probable exophytic cyst upper pole right kidney is incompletely visualized but similar to study from 04/17/2021. Musculoskeletal: No worrisome lytic or sclerotic osseous abnormality. Review of the MIP images confirms the above findings. IMPRESSION: 1. No CT evidence for acute pulmonary embolus. 2. 16 mm perifissural left lower lobe pulmonary nodule, new in the interval. Neoplasm a concern. Close follow-up  recommended. PET-CT may be warranted. 3. Tiny peripheral clustered nodularity in the superior segment left lower lobe may be postobstructive or related to infectious/inflammatory etiology. Attention on follow-up recommended. 4. Patchy ground-glass attenuation in both lungs with scattered additional tiny bilateral pulmonary nodules. Imaging features may be infectious/inflammatory. 5. Small right and tiny left pleural effusions. 6.  Aortic Atherosclerosis (ICD10-I70.0). Electronically Signed   By: Misty Stanley M.D.   On: 05/17/2022 12:17   DG Chest 2 View  Result Date: 05/17/2022 CLINICAL DATA:  Shortness of breath. EXAM: CHEST - 2 VIEW COMPARISON:  05/05/2022 FINDINGS: Cardiomegaly and hyperinflation again noted. A small RIGHT pleural effusion is present. There is no evidence of airspace disease, pneumothorax or acute bony abnormality. IMPRESSION: 1. Cardiomegaly with small RIGHT pleural effusion. 2. Hyperinflation. Electronically Signed   By: Margarette Canada M.D.   On: 05/17/2022 10:47   DG Chest Port 1 View  Result Date: 05/17/2022 CLINICAL DATA:  Shortness of breath EXAM: PORTABLE CHEST 1 VIEW COMPARISON:  05/15/2022 and prior radiographs FINDINGS: Cardiomegaly noted with pulmonary vascular congestion. A trace RIGHT pleural effusion and equivocal trace LEFT pleural effusion noted. Mild bibasilar opacities are present. There is no evidence  of pneumothorax or acute bony abnormality. IMPRESSION: 1. Cardiomegaly with pulmonary vascular congestion and trace RIGHT and equivocal trace LEFT pleural effusions. 2. Mild bibasilar opacities which may represent atelectasis or airspace disease/pneumonia. Electronically Signed   By: Margarette Canada M.D.   On: 05/17/2022 10:45    EKG: Independently reviewed.  Chronic RBBB and secondary nonspecific ST changes.  Assessment/Plan Principal Problem:   CHF (congestive heart failure) (HCC) Active Problems:   Essential hypertension   Paroxysmal atrial fibrillation (HCC)   Cardiomyopathy (HCC)   Acute on chronic systolic CHF (congestive heart failure) ([place])  (please populate well all problems here in Problem List. (For example, if patient is on BP meds at home and you resume or decide to hold them, it is a problem that needs to be her. Same for CAD, COPD, HLD and so on)  Acute on chronic HFrEF and pulmonary hypertension decompensation -With significant fluid overload -Continue IV Lasix 20 mg daily -I&O's, daily weight -Echocardiogram -Etiology considered to related to uncontrolled hypertension, BP meds were adjustment as below.  HTN, uncontrolled -Change metoprolol to Coreg for better BP control -Family reported Benicar was discontinued recently, will resume Benicar -As needed hydralazine for breakthrough BP -Discussed with family regarding as needed Lasix use as outpatient for weight gaining and BP control, family expressed understanding.  PAF -In sinus rhythm, continue amiodarone and renal dosed Eliquis  CKD stage II -Renal overload, Lasix as above, monitor kidney function  Deconditioning -PT evaluation  CODE STATUS -Patient desires DNR DNI  DVT prophylaxis: Eliquis Code Status: DNR Family Communication: Daughter at bedside Disposition Plan: Expect more than 2 midnight hospital stay, for IV diuresis and PT evaluation, expect to discharge to SNF Consults called: Cardiology Admission  status: Tele admit   Lequita Halt MD [facility] Pager [phone]  05/17/2022, 3:40 PM

## 2022-05-17 NOTE — Plan of Care (Signed)
°  Problem: Coping: °Goal: Level of anxiety will decrease °Outcome: Progressing °  °

## 2022-05-17 NOTE — ED Notes (Signed)
ED TO INPATIENT HANDOFF REPORT  ED Nurse Name and Phone #: Vikki Ports [phone]  S Name/Age/Gender [name] 89 y.o. female Room/Bed: 040C/040C  Code Status   Code Status: DNR  Home/SNF/Other Home Patient oriented to: self, place, time, and situation Is this baseline? Yes   Triage Complete: Triage complete  Chief Complaint CHF (congestive heart failure) ([place]) [I50.9]  Triage Note Per EMS, Pt, from home, c/o fatigue and cough x3 increasing x1 week.  Pt reports weakness w/ exertion.   Pt has recently been started on Lasix d/t bilateral foot swelling.  Denies pain.   Hx of A fib and Covid.     Allergies Allergies  Allergen Reactions   Donepezil Other (See Comments)    bradycardia   Actonel [Risedronate Sodium] Nausea And Vomiting   Aspirin-Caffeine     Upset stomach    Codeine     REACTION: upset stomach   Memantine    Penicillins    Sulfonamide Derivatives     REACTION: swollen tongue    Level of Care/Admitting Diagnosis ED Disposition     ED Disposition  Admit   Condition  --   Comment  Hospital Area: [place] [[id number]]  Level of Care: Telemetry Medical [104]  May admit patient to [facility] or [facility] if equivalent level of care is available:: No  Covid Evaluation: Asymptomatic - no recent exposure (last 10 days) testing not required  Diagnosis: CHF (congestive heart failure) [facility]) [id number]  Admitting Physician: Lequita Halt [id number]  Attending Physician: Lequita Halt [id number]  Certification:: I certify this patient will need inpatient services for at least 2 midnights  Estimated Length of Stay: 2          B Medical/Surgery History Past Medical History:  Diagnosis Date   Arthritis    Atrial fibrillation ([place])    postoperative   COVID-19    CVA (cerebral vascular accident) ([place])    HTN (hypertension)    MR (mitral regurgitation)    severe   NSTEMI (non-ST elevated myocardial infarction) ([place])    Orthostatic  hypotension 11/06/2011   Pericarditis    postoperative   Past Surgical History:  Procedure Laterality Date   ANTERIOR AND POSTERIOR VAGINAL REPAIR  10/15/04   Dr. Nori Riis    anterior repair with perigee graft  04/16/06   posterior repair with apogee graft; lynx mid urethral sling; sacrospinous ligamnet suspension and cystoscopy; Dr. Joya Martyr   MITRAL VALVE REPLACEMENT     sacral spinous ligament suspension of vagina     suprapubic cystectomy     TOTAL ABDOMINAL HYSTERECTOMY W/ BILATERAL SALPINGOOPHORECTOMY       A IV Location/Drains/Wounds Patient Lines/Drains/Airways Status     Active Line/Drains/Airways     Name Placement date Placement time Site Days   Peripheral IV 05/17/22 20 G Left Antecubital 05/17/22  0952  Antecubital  less than 1   External Urinary Catheter 05/17/22  1125  --  less than 1            Intake/Output Last 24 hours No intake or output data in the 24 hours ending 05/17/22 1720  Labs/Imaging Results for orders placed or performed during the hospital encounter of 05/17/22 (from the past 48 hour(s))  Urinalysis, w/ Reflex to Culture (Infection Suspected) -Urine, Clean Catch     Status: Abnormal   Collection Time: 05/17/22  9:38 AM  Result Value Ref Range   Specimen Source URINE, CLEAN CATCH    Color, Urine YELLOW  YELLOW   APPearance CLEAR CLEAR   Specific Gravity, Urine 1.013 1.005 - 1.030   pH 7.0 5.0 - 8.0   Glucose, UA NEGATIVE NEGATIVE mg/dL   Hgb urine dipstick NEGATIVE NEGATIVE   Bilirubin Urine NEGATIVE NEGATIVE   Ketones, ur 5 (A) NEGATIVE mg/dL   Protein, ur 100 (A) NEGATIVE mg/dL   Nitrite NEGATIVE NEGATIVE   Leukocytes,Ua NEGATIVE NEGATIVE   RBC / HPF 0-5 0 - 5 RBC/hpf   WBC, UA 0-5 0 - 5 WBC/hpf    Comment:        Reflex urine culture not performed if WBC <=10, OR if Squamous epithelial cells >5. If Squamous epithelial cells >5 suggest recollection.    Bacteria, UA NONE SEEN NONE SEEN   Squamous Epithelial / HPF 0-5 0 - 5 /HPF    Mucus PRESENT     Comment: Performed at [facility], [place] [address] [place], [place] [address]  Resp panel by RT-PCR (RSV, Flu A&B, Covid) Anterior Nasal Swab     Status: None   Collection Time: 05/17/22  9:38 AM   Specimen: Anterior Nasal Swab  Result Value Ref Range   SARS Coronavirus 2 by RT PCR NEGATIVE NEGATIVE   Influenza A by PCR NEGATIVE NEGATIVE   Influenza B by PCR NEGATIVE NEGATIVE    Comment: (NOTE) The Xpert Xpress SARS-CoV-2/FLU/RSV plus assay is intended as an aid in the diagnosis of influenza from Nasopharyngeal swab specimens and should not be used as a sole basis for treatment. Nasal washings and aspirates are unacceptable for Xpert Xpress SARS-CoV-2/FLU/RSV testing.  Fact Sheet for Patients: EntrepreneurPulse.com.au  Fact Sheet for Healthcare Providers: IncredibleEmployment.be  This test is not yet approved or cleared by the [place] FDA and has been authorized for detection and/or diagnosis of SARS-CoV-2 by FDA under an Emergency Use Authorization (EUA). This EUA will remain in effect (meaning this test can be used) for the duration of the COVID-19 declaration under Section 564(b)(1) of the Act, 21 U.S.C. section 360bbb-3(b)(1), unless the authorization is terminated or revoked.     Resp Syncytial Virus by PCR NEGATIVE NEGATIVE    Comment: (NOTE) Fact Sheet for Patients: EntrepreneurPulse.com.au  Fact Sheet for Healthcare Providers: IncredibleEmployment.be  This test is not yet approved or cleared by the [place] FDA and has been authorized for detection and/or diagnosis of SARS-CoV-2 by FDA under an Emergency Use Authorization (EUA). This EUA will remain in effect (meaning this test can be used) for the duration of the COVID-19 declaration under Section 564(b)(1) of the Act, 21 U.S.C. section 360bbb-3(b)(1), unless the authorization is terminated  or revoked.  Performed at [facility], [place] [address], [place], [place] [address]   Troponin I (High Sensitivity)     Status: Abnormal   Collection Time: 05/17/22  9:54 AM  Result Value Ref Range   Troponin I (High Sensitivity) 27 (H) <18 ng/L    Comment: (NOTE) Elevated high sensitivity troponin I (hsTnI) values and significant  changes across serial measurements may suggest ACS but many other  chronic and acute conditions are known to elevate hsTnI results.  Refer to the "Links" section for chest pain algorithms and additional  guidance. Performed at [facility], [place] [address], [place], [place] [address]   CBC with Differential     Status: Abnormal   Collection Time: 05/17/22  9:54 AM  Result Value Ref Range   WBC 7.4 4.0 - 10.5 K/uL   RBC 3.80 (L) 3.87 -  5.11 MIL/uL   Hemoglobin 11.9 (L) 12.0 - 15.0 g/dL   HCT 36.9 36.0 - 46.0 %   MCV 97.1 80.0 - 100.0 fL   MCH 31.3 26.0 - 34.0 pg   MCHC 32.2 30.0 - 36.0 g/dL   RDW 13.6 11.5 - 15.5 %   Platelets 260 150 - 400 K/uL   nRBC 0.0 0.0 - 0.2 %   Neutrophils Relative % 76 %   Neutro Abs 5.6 1.7 - 7.7 K/uL   Lymphocytes Relative 12 %   Lymphs Abs 0.9 0.7 - 4.0 K/uL   Monocytes Relative 9 %   Monocytes Absolute 0.7 0.1 - 1.0 K/uL   Eosinophils Relative 2 %   Eosinophils Absolute 0.1 0.0 - 0.5 K/uL   Basophils Relative 1 %   Basophils Absolute 0.0 0.0 - 0.1 K/uL   Immature Granulocytes 0 %   Abs Immature Granulocytes 0.03 0.00 - 0.07 K/uL    Comment: Performed at [facility] [address], [place], [place] [address]  Comprehensive metabolic panel     Status: Abnormal   Collection Time: 05/17/22  9:54 AM  Result Value Ref Range   Sodium 138 135 - 145 mmol/L   Potassium 3.5 3.5 - 5.1 mmol/L   Chloride 102 98 - 111 mmol/L   CO2 24 22 - 32 mmol/L   Glucose, Bld 101 (H) 70 - 99 mg/dL    Comment: Glucose reference range applies only to samples taken after fasting for at least 8 hours.   BUN 12 8 - 23  mg/dL   Creatinine, Ser 1.16 (H) 0.44 - 1.00 mg/dL   Calcium 9.0 8.9 - 10.3 mg/dL   Total Protein 6.4 (L) 6.5 - 8.1 g/dL   Albumin 2.9 (L) 3.5 - 5.0 g/dL   AST 36 15 - 41 U/L   ALT 31 0 - 44 U/L   Alkaline Phosphatase 89 38 - 126 U/L   Total Bilirubin 0.8 0.3 - 1.2 mg/dL   GFR, Estimated 45 (L) >60 mL/min    Comment: (NOTE) Calculated using the CKD-EPI Creatinine Equation (2021)    Anion gap 12 5 - 15    Comment: Performed at [facility], [place] [address], [place], [place] [address]  Lactic acid, plasma     Status: Abnormal   Collection Time: 05/17/22  9:54 AM  Result Value Ref Range   Lactic Acid, Venous 2.0 (HH) 0.5 - 1.9 mmol/L    Comment: CRITICAL RESULT CALLED TO, READ BACK BY AND VERIFIED WITH A,CHENEY RN '@[id number]'$  05/17/22 E,BENTON Performed at [facility], [place] [address], [place], [place] [address]   Lipase, blood     Status: None   Collection Time: 05/17/22  9:54 AM  Result Value Ref Range   Lipase 32 11 - 51 U/L    Comment: Performed at [facility] [address], [place], [place] [address]  Brain natriuretic peptide     Status: Abnormal   Collection Time: 05/17/22  9:54 AM  Result Value Ref Range   B Natriuretic Peptide 1,639.0 (H) 0.0 - 100.0 pg/mL    Comment: Performed at [facility] [address] [place], [place] [address]  CBG monitoring, ED     Status: Abnormal   Collection Time: 05/17/22  9:57 AM  Result Value Ref Range   Glucose-Capillary 102 (H) 70 - 99 mg/dL    Comment: Glucose reference range applies only to samples taken after fasting for at least  8 hours.   Comment 1 Notify RN    Comment 2 Document in Chart   Lactic acid, plasma     Status: None   Collection Time: 05/17/22 12:00 PM  Result Value Ref Range   Lactic Acid, Venous 1.1 0.5 - 1.9 mmol/L    Comment: Performed at [facility], [place] [address], [place], [place] [address]  Troponin I (High Sensitivity)     Status: Abnormal   Collection Time: 05/17/22 12:00 PM   Result Value Ref Range   Troponin I (High Sensitivity) 30 (H) <18 ng/L    Comment: (NOTE) Elevated high sensitivity troponin I (hsTnI) values and significant  changes across serial measurements may suggest ACS but many other  chronic and acute conditions are known to elevate hsTnI results.  Refer to the "Links" section for chest pain algorithms and additional  guidance. Performed at [facility], [place] [address] [place], [place] [address]    CT Angio Chest PE W and/or Wo Contrast  Result Date: 05/17/2022 CLINICAL DATA:  Fatigue with cough.  PE suspected. EXAM: CT ANGIOGRAPHY CHEST WITH CONTRAST TECHNIQUE: Multidetector CT imaging of the chest was performed using the standard protocol during bolus administration of intravenous contrast. Multiplanar CT image reconstructions and MIPs were obtained to evaluate the vascular anatomy. RADIATION DOSE REDUCTION: This exam was performed according to the departmental dose-optimization program which includes automated exposure control, adjustment of the mA and/or kV according to patient size and/or use of iterative reconstruction technique. CONTRAST:  50m OMNIPAQUE IOHEXOL 350 MG/ML SOLN COMPARISON:  Chest CT 12/11/2021 FINDINGS: Cardiovascular: Heart is enlarged. No substantial pericardial effusion. Coronary artery calcification is evident. Mild atherosclerotic calcification is noted in the wall of the thoracic aorta. Ascending thoracic aorta measures 3.9 cm diameter. There is no filling defect within the opacified pulmonary arteries to suggest the presence of an acute pulmonary embolus. Mediastinum/Nodes: Mild mediastinal lymphadenopathy evident with 10 mm short axis precarinal node on 36/5 and 13 mm short axis subcarinal node on 60/5. There is no hilar lymphadenopathy. The esophagus has normal imaging features. There is no axillary lymphadenopathy. Lungs/Pleura: 16 mm perifissural left lower lobe pulmonary nodule on image 61/6 is new in the interval.  Tiny peripheral clustered nodularity in the superior segment left lower lobe may be postobstructive or related to infectious/inflammatory etiology. Patchy ground-glass attenuation noted in both lungs with scattered additional tiny bilateral pulmonary nodules. Small right and tiny left pleural effusions evident. Upper Abdomen: Probable exophytic cyst upper pole right kidney is incompletely visualized but similar to study from 04/17/2021. Musculoskeletal: No worrisome lytic or sclerotic osseous abnormality. Review of the MIP images confirms the above findings. IMPRESSION: 1. No CT evidence for acute pulmonary embolus. 2. 16 mm perifissural left lower lobe pulmonary nodule, new in the interval. Neoplasm a concern. Close follow-up recommended. PET-CT may be warranted. 3. Tiny peripheral clustered nodularity in the superior segment left lower lobe may be postobstructive or related to infectious/inflammatory etiology. Attention on follow-up recommended. 4. Patchy ground-glass attenuation in both lungs with scattered additional tiny bilateral pulmonary nodules. Imaging features may be infectious/inflammatory. 5. Small right and tiny left pleural effusions. 6.  Aortic Atherosclerosis (ICD10-I70.0). Electronically Signed   By: EMisty StanleyM.D.   On: 05/17/2022 12:17   DG Chest Port 1 View  Result Date: 05/17/2022 CLINICAL DATA:  Shortness of breath EXAM: PORTABLE CHEST 1 VIEW COMPARISON:  05/15/2022 and prior radiographs FINDINGS: Cardiomegaly noted with pulmonary vascular congestion. A trace RIGHT pleural effusion and  equivocal trace LEFT pleural effusion noted. Mild bibasilar opacities are present. There is no evidence of pneumothorax or acute bony abnormality. IMPRESSION: 1. Cardiomegaly with pulmonary vascular congestion and trace RIGHT and equivocal trace LEFT pleural effusions. 2. Mild bibasilar opacities which may represent atelectasis or airspace disease/pneumonia. Electronically Signed   By: Margarette Canada M.D.    On: 05/17/2022 10:45    Pending Labs Unresulted Labs (From admission, onward)     Start     Ordered   05/18/22 [id number]  Basic metabolic panel  Daily,   R     Comments: As Scheduled for 5 days    05/17/22 1524   05/17/22 1525  Magnesium  Add-on,   AD        05/17/22 1524   05/17/22 0951  Culture, blood (routine x 2)  BLOOD CULTURE X 2,   R (with STAT occurrences)      05/17/22 0950            Vitals/Pain Today's Vitals   05/17/22 1320 05/17/22 1330 05/17/22 1357 05/17/22 1400  BP:  (!) 162/67  (!) 156/79  Pulse:  73  72  Resp:  (!) 28  (!) 30  Temp:   98.1 F (36.7 C)   TempSrc:   Oral   SpO2:  94%  93%  Weight:      Height:      PainSc: 0-No pain       Isolation Precautions No active isolations  Medications Medications  amiodarone (PACERONE) tablet 200 mg (has no administration in time range)  atorvastatin (LIPITOR) tablet 10 mg (has no administration in time range)  carvedilol (COREG) tablet 3.125 mg (has no administration in time range)  irbesartan (AVAPRO) tablet 300 mg (has no administration in time range)  apixaban (ELIQUIS) tablet 2.5 mg (has no administration in time range)  loratadine (CLARITIN) tablet 10 mg (has no administration in time range)  sodium chloride flush (NS) 0.9 % injection 3 mL (has no administration in time range)  sodium chloride flush (NS) 0.9 % injection 3 mL (has no administration in time range)  0.9 %  sodium chloride infusion (has no administration in time range)  acetaminophen (TYLENOL) tablet 650 mg (has no administration in time range)  ondansetron (ZOFRAN) injection 4 mg (has no administration in time range)  hydrALAZINE (APRESOLINE) injection 2 mg (has no administration in time range)  potassium chloride SA (KLOR-CON M) CR tablet 40 mEq (has no administration in time range)  furosemide (LASIX) injection 20 mg (has no administration in time range)  iohexol (OMNIPAQUE) 350 MG/ML injection 50 mL (50 mLs Intravenous Contrast Given  05/17/22 1150)  furosemide (LASIX) injection 20 mg (20 mg Intravenous Given 05/17/22 1500)    Mobility walks with device     Focused Assessments Cardiac Assessment Handoff:  Cardiac Rhythm: Normal sinus rhythm No results found for: "CKTOTAL", "CKMB", "CKMBINDEX", "TROPONINI" Lab Results  Component Value Date   DDIMER 0.70 (H) 10/01/2020   Does the Patient currently have chest pain? Yes    R Recommendations: See Admitting Provider Note  Report given to:   Additional Notes: Pt getting cardiac meds before coming upstairs

## 2022-05-17 NOTE — ED Notes (Signed)
ED TO INPATIENT HANDOFF REPORT  ED Nurse Name and Phone #: [phone]  S Name/Age/Gender [name] 89 y.o. female Room/Bed: 040C/040C  Code Status   Code Status: DNR  Home/SNF/Other Home Patient oriented to: self, place, time, and situation Is this baseline? Yes   Triage Complete: Triage complete  Chief Complaint CHF (congestive heart failure) ([place]) [I50.9]  Triage Note Per EMS, Pt, from home, c/o fatigue and cough x3 increasing x1 week.  Pt reports weakness w/ exertion.   Pt has recently been started on Lasix d/t bilateral foot swelling.  Denies pain.   Hx of A fib and Covid.     Allergies Allergies  Allergen Reactions   Donepezil Other (See Comments)    bradycardia   Actonel [Risedronate Sodium] Nausea And Vomiting   Aspirin-Caffeine     Upset stomach    Codeine     REACTION: upset stomach   Memantine    Penicillins    Sulfonamide Derivatives     REACTION: swollen tongue    Level of Care/Admitting Diagnosis ED Disposition     ED Disposition  Admit   Condition  --   Comment  Hospital Area: [facility] [[id number]]  Level of Care: Progressive [102]  Admit to Progressive based on following criteria: CARDIOVASCULAR & THORACIC of moderate stability with acute coronary syndrome symptoms/low risk myocardial infarction/hypertensive urgency/arrhythmias/heart failure potentially compromising stability and stable post cardiovascular intervention patients.  May admit patient to [facility] or [facility] if equivalent level of care is available:: No  Covid Evaluation: Asymptomatic - no recent exposure (last 10 days) testing not required  Diagnosis: CHF (congestive heart failure) [facility]) [id number]  Admitting Physician: Lequita Halt [id number]  Attending Physician: Lequita Halt [id number]  Certification:: I certify this patient will need inpatient services for at least 2 midnights          B Medical/Surgery History Past Medical History:   Diagnosis Date   Arthritis    Atrial fibrillation ([place])    postoperative   COVID-19    CVA (cerebral vascular accident) ([place])    HTN (hypertension)    MR (mitral regurgitation)    severe   NSTEMI (non-ST elevated myocardial infarction) ([place])    Orthostatic hypotension 11/06/2011   Pericarditis    postoperative   Past Surgical History:  Procedure Laterality Date   ANTERIOR AND POSTERIOR VAGINAL REPAIR  10/15/04   Dr. Nori Riis    anterior repair with perigee graft  04/16/06   posterior repair with apogee graft; lynx mid urethral sling; sacrospinous ligamnet suspension and cystoscopy; Dr. Joya Martyr   MITRAL VALVE REPLACEMENT     sacral spinous ligament suspension of vagina     suprapubic cystectomy     TOTAL ABDOMINAL HYSTERECTOMY W/ BILATERAL SALPINGOOPHORECTOMY       A IV Location/Drains/Wounds Patient Lines/Drains/Airways Status     Active Line/Drains/Airways     Name Placement date Placement time Site Days   Peripheral IV 05/17/22 20 G Left Antecubital 05/17/22  0952  Antecubital  less than 1   External Urinary Catheter 05/17/22  1125  --  less than 1            Intake/Output Last 24 hours No intake or output data in the 24 hours ending 05/17/22 1836  Labs/Imaging Results for orders placed or performed during the hospital encounter of 05/17/22 (from the past 48 hour(s))  Urinalysis, w/ Reflex to Culture (Infection Suspected) -Urine, Clean Catch     Status: Abnormal  Collection Time: 05/17/22  9:38 AM  Result Value Ref Range   Specimen Source URINE, CLEAN CATCH    Color, Urine YELLOW YELLOW   APPearance CLEAR CLEAR   Specific Gravity, Urine 1.013 1.005 - 1.030   pH 7.0 5.0 - 8.0   Glucose, UA NEGATIVE NEGATIVE mg/dL   Hgb urine dipstick NEGATIVE NEGATIVE   Bilirubin Urine NEGATIVE NEGATIVE   Ketones, ur 5 (A) NEGATIVE mg/dL   Protein, ur 100 (A) NEGATIVE mg/dL   Nitrite NEGATIVE NEGATIVE   Leukocytes,Ua NEGATIVE NEGATIVE   RBC / HPF 0-5 0 - 5 RBC/hpf   WBC, UA  0-5 0 - 5 WBC/hpf    Comment:        Reflex urine culture not performed if WBC <=10, OR if Squamous epithelial cells >5. If Squamous epithelial cells >5 suggest recollection.    Bacteria, UA NONE SEEN NONE SEEN   Squamous Epithelial / HPF 0-5 0 - 5 /HPF   Mucus PRESENT     Comment: Performed at [facility], [place] [address], [place], [place] [address]  Resp panel by RT-PCR (RSV, Flu A&B, Covid) Anterior Nasal Swab     Status: None   Collection Time: 05/17/22  9:38 AM   Specimen: Anterior Nasal Swab  Result Value Ref Range   SARS Coronavirus 2 by RT PCR NEGATIVE NEGATIVE   Influenza A by PCR NEGATIVE NEGATIVE   Influenza B by PCR NEGATIVE NEGATIVE    Comment: (NOTE) The Xpert Xpress SARS-CoV-2/FLU/RSV plus assay is intended as an aid in the diagnosis of influenza from Nasopharyngeal swab specimens and should not be used as a sole basis for treatment. Nasal washings and aspirates are unacceptable for Xpert Xpress SARS-CoV-2/FLU/RSV testing.  Fact Sheet for Patients: EntrepreneurPulse.com.au  Fact Sheet for Healthcare Providers: IncredibleEmployment.be  This test is not yet approved or cleared by the [place] FDA and has been authorized for detection and/or diagnosis of SARS-CoV-2 by FDA under an Emergency Use Authorization (EUA). This EUA will remain in effect (meaning this test can be used) for the duration of the COVID-19 declaration under Section 564(b)(1) of the Act, 21 U.S.C. section 360bbb-3(b)(1), unless the authorization is terminated or revoked.     Resp Syncytial Virus by PCR NEGATIVE NEGATIVE    Comment: (NOTE) Fact Sheet for Patients: EntrepreneurPulse.com.au  Fact Sheet for Healthcare Providers: IncredibleEmployment.be  This test is not yet approved or cleared by the [place] FDA and has been authorized for detection and/or diagnosis of SARS-CoV-2 by FDA under an Emergency  Use Authorization (EUA). This EUA will remain in effect (meaning this test can be used) for the duration of the COVID-19 declaration under Section 564(b)(1) of the Act, 21 U.S.C. section 360bbb-3(b)(1), unless the authorization is terminated or revoked.  Performed at [facility], [place] [address] [place], [place] [address]   Troponin I (High Sensitivity)     Status: Abnormal   Collection Time: 05/17/22  9:54 AM  Result Value Ref Range   Troponin I (High Sensitivity) 27 (H) <18 ng/L    Comment: (NOTE) Elevated high sensitivity troponin I (hsTnI) values and significant  changes across serial measurements may suggest ACS but many other  chronic and acute conditions are known to elevate hsTnI results.  Refer to the "Links" section for chest pain algorithms and additional  guidance. Performed at [facility], [place] [address] [place], [place] [address]   CBC with Differential     Status: Abnormal   Collection  Time: 05/17/22  9:54 AM  Result Value Ref Range   WBC 7.4 4.0 - 10.5 K/uL   RBC 3.80 (L) 3.87 - 5.11 MIL/uL   Hemoglobin 11.9 (L) 12.0 - 15.0 g/dL   HCT 36.9 36.0 - 46.0 %   MCV 97.1 80.0 - 100.0 fL   MCH 31.3 26.0 - 34.0 pg   MCHC 32.2 30.0 - 36.0 g/dL   RDW 13.6 11.5 - 15.5 %   Platelets 260 150 - 400 K/uL   nRBC 0.0 0.0 - 0.2 %   Neutrophils Relative % 76 %   Neutro Abs 5.6 1.7 - 7.7 K/uL   Lymphocytes Relative 12 %   Lymphs Abs 0.9 0.7 - 4.0 K/uL   Monocytes Relative 9 %   Monocytes Absolute 0.7 0.1 - 1.0 K/uL   Eosinophils Relative 2 %   Eosinophils Absolute 0.1 0.0 - 0.5 K/uL   Basophils Relative 1 %   Basophils Absolute 0.0 0.0 - 0.1 K/uL   Immature Granulocytes 0 %   Abs Immature Granulocytes 0.03 0.00 - 0.07 K/uL    Comment: Performed at [facility], [address], [place], [place] [address]  Comprehensive metabolic panel     Status: Abnormal   Collection Time: 05/17/22  9:54 AM  Result Value Ref Range   Sodium 138 135 - 145 mmol/L    Potassium 3.5 3.5 - 5.1 mmol/L   Chloride 102 98 - 111 mmol/L   CO2 24 22 - 32 mmol/L   Glucose, Bld 101 (H) 70 - 99 mg/dL    Comment: Glucose reference range applies only to samples taken after fasting for at least 8 hours.   BUN 12 8 - 23 mg/dL   Creatinine, Ser 1.16 (H) 0.44 - 1.00 mg/dL   Calcium 9.0 8.9 - 10.3 mg/dL   Total Protein 6.4 (L) 6.5 - 8.1 g/dL   Albumin 2.9 (L) 3.5 - 5.0 g/dL   AST 36 15 - 41 U/L   ALT 31 0 - 44 U/L   Alkaline Phosphatase 89 38 - 126 U/L   Total Bilirubin 0.8 0.3 - 1.2 mg/dL   GFR, Estimated 45 (L) >60 mL/min    Comment: (NOTE) Calculated using the CKD-EPI Creatinine Equation (2021)    Anion gap 12 5 - 15    Comment: Performed at [facility], [place] [address] [place], [place] [address]  Lactic acid, plasma     Status: Abnormal   Collection Time: 05/17/22  9:54 AM  Result Value Ref Range   Lactic Acid, Venous 2.0 (HH) 0.5 - 1.9 mmol/L    Comment: CRITICAL RESULT CALLED TO, READ BACK BY AND VERIFIED WITH A,CHENEY RN '@[id number]'$  05/17/22 E,BENTON Performed at [facility], [place] [address], [place], [place] [address]   Lipase, blood     Status: None   Collection Time: 05/17/22  9:54 AM  Result Value Ref Range   Lipase 32 11 - 51 U/L    Comment: Performed at [facility] [address] [place], [place] [address]  Brain natriuretic peptide     Status: Abnormal   Collection Time: 05/17/22  9:54 AM  Result Value Ref Range   B Natriuretic Peptide 1,639.0 (H) 0.0 - 100.0 pg/mL    Comment: Performed at [facility] [address] [place], [place] [address]  CBG monitoring, ED     Status: Abnormal   Collection Time: 05/17/22  9:57 AM  Result Value Ref Range  Glucose-Capillary 102 (H) 70 - 99 mg/dL    Comment: Glucose reference range applies only to samples taken after fasting for at least 8 hours.   Comment 1 Notify RN    Comment 2 Document in Chart   Lactic acid, plasma     Status: None   Collection Time: 05/17/22 12:00 PM   Result Value Ref Range   Lactic Acid, Venous 1.1 0.5 - 1.9 mmol/L    Comment: Performed at [facility], [place] [address] [place], [place] [address]  Troponin I (High Sensitivity)     Status: Abnormal   Collection Time: 05/17/22 12:00 PM  Result Value Ref Range   Troponin I (High Sensitivity) 30 (H) <18 ng/L    Comment: (NOTE) Elevated high sensitivity troponin I (hsTnI) values and significant  changes across serial measurements may suggest ACS but many other  chronic and acute conditions are known to elevate hsTnI results.  Refer to the "Links" section for chest pain algorithms and additional  guidance. Performed at [facility], [place] [address] [place], [place] [address]    CT Angio Chest PE W and/or Wo Contrast  Result Date: 05/17/2022 CLINICAL DATA:  Fatigue with cough.  PE suspected. EXAM: CT ANGIOGRAPHY CHEST WITH CONTRAST TECHNIQUE: Multidetector CT imaging of the chest was performed using the standard protocol during bolus administration of intravenous contrast. Multiplanar CT image reconstructions and MIPs were obtained to evaluate the vascular anatomy. RADIATION DOSE REDUCTION: This exam was performed according to the departmental dose-optimization program which includes automated exposure control, adjustment of the mA and/or kV according to patient size and/or use of iterative reconstruction technique. CONTRAST:  52m OMNIPAQUE IOHEXOL 350 MG/ML SOLN COMPARISON:  Chest CT 12/11/2021 FINDINGS: Cardiovascular: Heart is enlarged. No substantial pericardial effusion. Coronary artery calcification is evident. Mild atherosclerotic calcification is noted in the wall of the thoracic aorta. Ascending thoracic aorta measures 3.9 cm diameter. There is no filling defect within the opacified pulmonary arteries to suggest the presence of an acute pulmonary embolus. Mediastinum/Nodes: Mild mediastinal lymphadenopathy evident with 10 mm short axis precarinal node on 36/5 and 13 mm short  axis subcarinal node on 60/5. There is no hilar lymphadenopathy. The esophagus has normal imaging features. There is no axillary lymphadenopathy. Lungs/Pleura: 16 mm perifissural left lower lobe pulmonary nodule on image 61/6 is new in the interval. Tiny peripheral clustered nodularity in the superior segment left lower lobe may be postobstructive or related to infectious/inflammatory etiology. Patchy ground-glass attenuation noted in both lungs with scattered additional tiny bilateral pulmonary nodules. Small right and tiny left pleural effusions evident. Upper Abdomen: Probable exophytic cyst upper pole right kidney is incompletely visualized but similar to study from 04/17/2021. Musculoskeletal: No worrisome lytic or sclerotic osseous abnormality. Review of the MIP images confirms the above findings. IMPRESSION: 1. No CT evidence for acute pulmonary embolus. 2. 16 mm perifissural left lower lobe pulmonary nodule, new in the interval. Neoplasm a concern. Close follow-up recommended. PET-CT may be warranted. 3. Tiny peripheral clustered nodularity in the superior segment left lower lobe may be postobstructive or related to infectious/inflammatory etiology. Attention on follow-up recommended. 4. Patchy ground-glass attenuation in both lungs with scattered additional tiny bilateral pulmonary nodules. Imaging features may be infectious/inflammatory. 5. Small right and tiny left pleural effusions. 6.  Aortic Atherosclerosis (ICD10-I70.0). Electronically Signed   By: EMisty StanleyM.D.   On: 05/17/2022 12:17   DG Chest Port 1 View  Result Date: 05/17/2022 CLINICAL DATA:  Shortness of breath  EXAM: PORTABLE CHEST 1 VIEW COMPARISON:  05/15/2022 and prior radiographs FINDINGS: Cardiomegaly noted with pulmonary vascular congestion. A trace RIGHT pleural effusion and equivocal trace LEFT pleural effusion noted. Mild bibasilar opacities are present. There is no evidence of pneumothorax or acute bony abnormality. IMPRESSION:  1. Cardiomegaly with pulmonary vascular congestion and trace RIGHT and equivocal trace LEFT pleural effusions. 2. Mild bibasilar opacities which may represent atelectasis or airspace disease/pneumonia. Electronically Signed   By: Margarette Canada M.D.   On: 05/17/2022 10:45    Pending Labs Unresulted Labs (From admission, onward)     Start     Ordered   05/18/22 [id number]  Basic metabolic panel  Daily,   R     Comments: As Scheduled for 5 days    05/17/22 1524   05/17/22 1525  Magnesium  Add-on,   AD        05/17/22 1524   05/17/22 0951  Culture, blood (routine x 2)  BLOOD CULTURE X 2,   R      05/17/22 0950            Vitals/Pain Today's Vitals   05/17/22 1320 05/17/22 1330 05/17/22 1357 05/17/22 1400  BP:  (!) 162/67  (!) 156/79  Pulse:  73  72  Resp:  (!) 28  (!) 30  Temp:   98.1 F (36.7 C)   TempSrc:   Oral   SpO2:  94%  93%  Weight:      Height:      PainSc: 0-No pain       Isolation Precautions No active isolations  Medications Medications  amiodarone (PACERONE) tablet 200 mg (200 mg Oral Given 05/17/22 1724)  atorvastatin (LIPITOR) tablet 10 mg (10 mg Oral Given 05/17/22 1726)  carvedilol (COREG) tablet 3.125 mg (3.125 mg Oral Given 05/17/22 1724)  irbesartan (AVAPRO) tablet 300 mg (300 mg Oral Given 05/17/22 1725)  apixaban (ELIQUIS) tablet 2.5 mg (2.5 mg Oral Given 05/17/22 1725)  loratadine (CLARITIN) tablet 10 mg (10 mg Oral Given 05/17/22 1725)  sodium chloride flush (NS) 0.9 % injection 3 mL (has no administration in time range)  sodium chloride flush (NS) 0.9 % injection 3 mL (has no administration in time range)  0.9 %  sodium chloride infusion (has no administration in time range)  acetaminophen (TYLENOL) tablet 650 mg (has no administration in time range)  ondansetron (ZOFRAN) injection 4 mg (has no administration in time range)  hydrALAZINE (APRESOLINE) injection 2 mg (has no administration in time range)  furosemide (LASIX) injection 20 mg (has no administration in  time range)  metoprolol tartrate (LOPRESSOR) injection 2.5 mg (2.5 mg Intravenous Given 05/17/22 1824)  iohexol (OMNIPAQUE) 350 MG/ML injection 50 mL (50 mLs Intravenous Contrast Given 05/17/22 1150)  furosemide (LASIX) injection 20 mg (20 mg Intravenous Given 05/17/22 1500)  potassium chloride SA (KLOR-CON M) CR tablet 40 mEq (40 mEq Oral Given 05/17/22 1725)    Mobility walks with device     Focused Assessments Cardiac Assessment Handoff:  Cardiac Rhythm: Normal sinus rhythm No results found for: "CKTOTAL", "CKMB", "CKMBINDEX", "TROPONINI" Lab Results  Component Value Date   DDIMER 0.70 (H) 10/01/2020   Does the Patient currently have chest pain? No    R Recommendations: See Admitting Provider Note  Report given to:   Additional Notes: pt HR was going in the 130's she received lopressor 2.5 mg which is ordered prn in the chart. The pt said she was a little short of breath place her on  3 Liter [place] for comfort. Her HR is now 113 to 119.

## 2022-05-17 NOTE — ED Triage Notes (Signed)
Per EMS, Pt, from home, c/o fatigue and cough x3 increasing x1 week.  Pt reports weakness w/ exertion.   Pt has recently been started on Lasix d/t bilateral foot swelling.  Denies pain.   Hx of A fib and Covid.

## 2022-05-17 NOTE — ED Provider Notes (Signed)
[facility] Provider Note   CSN: [id number] Arrival date & time: 05/17/22  0935     History  Chief Complaint  Patient presents with   Weakness   Cough    [name] is a 89 y.o. female.  89 year old female with prior medical history as detailed below presents for evaluation.  Patient reports gradually worsening fatigue and weakness over the last 3 to 4 weeks.  Patient with gradually worsening cough x 2 weeks.  Patient reports that over the last 3 to 4 days she has had significant shortness of breath and dyspnea with exertion.  Patient has been seen by her PCP for these complaints.  She was given several days of Lasix to treat lower extremity edema - she started taking Lasix 20 mg daily on March 7.  She has taken this for 2 days with some improvement in her lower extremity edema.  She reports getting a prescription for doxycycline on February 22 for apparent cough.  This helped her symptoms minimally.  She denies associated fever or pain.  She reports that she is comfortable at rest.  With exertion or walking she feels significant shortness of breath and fatigue.    The history is provided by the patient and medical records.       Home Medications Prior to Admission medications   Medication Sig Start Date End Date Taking? Authorizing Provider  amiodarone (PACERONE) 200 MG tablet Take one tablet by mouth daily 01/08/22   Sherran Needs, NP  apixaban (ELIQUIS) 2.5 MG TABS tablet Take 1 tablet (2.5 mg total) by mouth 2 (two) times daily. 02/19/22   Josue Hector, MD  atorvastatin (LIPITOR) 10 MG tablet Take 1 tablet (10 mg total) by mouth daily. 01/17/22   Josue Hector, MD  Biotin 1000 MCG tablet Take 1,000 mcg by mouth daily.    [provider]  cetirizine (ZYRTEC) 5 MG tablet Take 1 tablet (5 mg total) by mouth daily. 02/08/21   Gwenlyn Perking, FNP  Cholecalciferol (VITAMIN D3) 10000 UNITS capsule Take 10,000 Units  by mouth daily.      [provider]  fluorouracil (EFUDEX) 5 % cream Apply topically as needed. 06/03/21   [provider]  furosemide (LASIX) 20 MG tablet Take 1 tablet (20 mg total) by mouth daily. 05/15/22   [name], MD  metoprolol succinate (TOPROL-XL) 25 MG 24 hr tablet Take 1 tablet by mouth once daily 03/25/22   Josue Hector, MD  olmesartan (BENICAR) 40 MG tablet Take 1 tablet (40 mg total) by mouth daily. For blood pressure 01/07/22   [name], MD      Allergies    Donepezil, Actonel [risedronate sodium], Aspirin-caffeine, Codeine, Memantine, Penicillins, and Sulfonamide derivatives    Review of Systems   Review of Systems  All other systems reviewed and are negative.   Physical Exam Updated Vital Signs BP (!) 172/102 Comment: Pt hasnt taken HTN meds  Pulse 82   Temp 98.3 F (36.8 C) (Oral)   Resp (!) 26   Ht 5' 3.5" (1.613 m)   Wt 48.5 kg   SpO2 97%   BMI 18.66 kg/m  Physical Exam Vitals and nursing note reviewed.  Constitutional:      General: She is not in acute distress.    Appearance: Normal appearance. She is well-developed.  HENT:     Head: Normocephalic and atraumatic.  Eyes:     Conjunctiva/sclera: Conjunctivae normal.  Pupils: Pupils are equal, round, and reactive to light.  Cardiovascular:     Rate and Rhythm: Normal rate and regular rhythm.     Heart sounds: Normal heart sounds.  Pulmonary:     Effort: Pulmonary effort is normal. No respiratory distress.     Comments: Mildly decreased breath sounds at bilateral bases Abdominal:     General: There is no distension.     Palpations: Abdomen is soft.     Tenderness: There is no abdominal tenderness.  Musculoskeletal:        General: No deformity. Normal range of motion.     Cervical back: Normal range of motion and neck supple.  Skin:    General: Skin is warm and dry.  Neurological:     General: No focal deficit present.     Mental Status: She is alert and  oriented to person, place, and time.     ED Results / Procedures / Treatments   Labs (all labs ordered are listed, but only abnormal results are displayed) Labs Reviewed  CBC WITH DIFFERENTIAL/PLATELET - Abnormal; Notable for the following components:      Result Value   RBC 3.80 (*)    Hemoglobin 11.9 (*)    All other components within normal limits  COMPREHENSIVE METABOLIC PANEL - Abnormal; Notable for the following components:   Glucose, Bld 101 (*)    Creatinine, Ser 1.16 (*)    Total Protein 6.4 (*)    Albumin 2.9 (*)    GFR, Estimated 45 (*)    All other components within normal limits  LACTIC ACID, PLASMA - Abnormal; Notable for the following components:   Lactic Acid, Venous 2.0 (*)    All other components within normal limits  URINALYSIS, W/ REFLEX TO CULTURE (INFECTION SUSPECTED) - Abnormal; Notable for the following components:   Ketones, ur 5 (*)    Protein, ur 100 (*)    All other components within normal limits  BRAIN NATRIURETIC PEPTIDE - Abnormal; Notable for the following components:   B Natriuretic Peptide 1,639.0 (*)    All other components within normal limits  CBG MONITORING, ED - Abnormal; Notable for the following components:   Glucose-Capillary 102 (*)    All other components within normal limits  TROPONIN I (HIGH SENSITIVITY) - Abnormal; Notable for the following components:   Troponin I (High Sensitivity) 27 (*)    All other components within normal limits  TROPONIN I (HIGH SENSITIVITY) - Abnormal; Notable for the following components:   Troponin I (High Sensitivity) 30 (*)    All other components within normal limits  RESP PANEL BY RT-PCR (RSV, FLU A&B, COVID)  [id number]  CULTURE, BLOOD (ROUTINE X 2)  CULTURE, BLOOD (ROUTINE X 2)  LACTIC ACID, PLASMA  LIPASE, BLOOD    EKG EKG Interpretation  Date/Time:  Saturday May 17 2022 09:48:33 EST Ventricular Rate:  79 PR Interval:  49 QRS Duration: 142 QT Interval:  435 QTC Calculation: 499 R  Axis:   -76 Text Interpretation: Sinus or ectopic atrial rhythm Short PR interval RBBB and LAFB Left ventricular hypertrophy Confirmed by Dene Gentry [phone]) on 05/17/2022 9:50:51 AM  Radiology No results found.  Procedures Procedures    Medications Ordered in ED Medications - No data to display  ED Course/ Medical Decision Making/ A&P                             Medical Decision Making Amount and/or  Complexity of Data Reviewed Labs: ordered. Radiology: ordered.  Risk Prescription drug management.    Medical Screen Complete  This patient presented to the ED with complaint of shortness of breath, cough, fatigue.  This complaint involves an extensive number of treatment options. The initial differential diagnosis includes, but is not limited to, CHF, infection, metabolic abnormality, ACS, etc.  This presentation is: Acute, Self-Limited, Previously Undiagnosed, Uncertain Prognosis, Complicated, Systemic Symptoms, and Threat to Life/Bodily Function  Patient is presenting with complaint of shortness of breath, cough, gradually increasing dyspnea with exertion.  Patient has completed 1 round of doxycycline prescribed on February 22.  Patient was given Lasix 20 mg daily for the last 2 days with some improvement of her lower extremity edema.  She reports that her dyspnea and fatigue has worsened despite Lasix use at home.  Presentation is most consistent with CHF exacerbation.  Hospitalist  [facility]) service made aware of case and need to admit.   Additional history obtained:  External records from outside sources obtained and reviewed including prior ED visits and prior Inpatient records.    Lab Tests:  I ordered and personally interpreted labs.  The pertinent results include: CBC, CMP, troponin, BNP, blood cultures, lactic acid, COVID, flu   Imaging Studies ordered:  I ordered imaging studies including chest x-ray, CT angio chest I independently visualized and  interpreted obtained imaging which showed no PE, possible new pulmonary nodule, effusions, patchy groundglass attenuation in both lungs I agree with the radiologist interpretation.   Cardiac Monitoring:  The patient was maintained on a cardiac monitor.  I personally viewed and interpreted the cardiac monitor which showed an underlying rhythm of: NSR   Medicines ordered:  I ordered medication including Lasix  for CHF  Reevaluation of the patient after these medicines showed that the patient: improved  Problem List / ED Course:  Dyspnea    Reevaluation:  After the interventions noted above, I reevaluated the patient and found that they have: improved     Disposition:  After consideration of the diagnostic results and the patients response to treatment, I feel that the patent would benefit from admission.          Final Clinical Impression(s) / ED Diagnoses Final diagnoses:  Dyspnea, unspecified type    Rx / DC Orders ED Discharge Orders     None         Valarie Merino, MD 05/17/22 1542

## 2022-05-17 NOTE — ED Notes (Signed)
UA and urine culture sent to lab.

## 2022-05-17 NOTE — ED Notes (Addendum)
During blood draw, Pt's heart rate jumped into the 120s.  New EKG shot and EDP made aware. Pt was asking about needing to be admitted and thinks it was anxiety related.  Pt sts "the same thing happens at night sometimes."

## 2022-05-17 NOTE — ED Notes (Signed)
When going in to get pt EKG because of high heart rate pt family and pt both ask me to help then put a pillow under her back because her back was hurting. I asked pt to give me one min. I wanted to get EKG to EDMD some that they can see pt high heartbeat.

## 2022-05-18 ENCOUNTER — Encounter: Payer: Self-pay | Admitting: Family Medicine

## 2022-05-18 ENCOUNTER — Inpatient Hospital Stay (HOSPITAL_COMMUNITY): Payer: Medicare HMO

## 2022-05-18 ENCOUNTER — Other Ambulatory Visit (HOSPITAL_COMMUNITY): Payer: Medicare HMO

## 2022-05-18 DIAGNOSIS — I5021 Acute systolic (congestive) heart failure: Secondary | ICD-10-CM

## 2022-05-18 DIAGNOSIS — I7121 Aneurysm of the ascending aorta, without rupture: Secondary | ICD-10-CM | POA: Diagnosis not present

## 2022-05-18 DIAGNOSIS — I480 Paroxysmal atrial fibrillation: Secondary | ICD-10-CM | POA: Diagnosis not present

## 2022-05-18 DIAGNOSIS — I10 Essential (primary) hypertension: Secondary | ICD-10-CM | POA: Diagnosis not present

## 2022-05-18 DIAGNOSIS — I5023 Acute on chronic systolic (congestive) heart failure: Secondary | ICD-10-CM | POA: Diagnosis not present

## 2022-05-18 LAB — ECHOCARDIOGRAM COMPLETE
AR max vel: 2.8 cm2
AV Area VTI: 2.35 cm2
AV Area mean vel: 2.58 cm2
AV Mean grad: 2 mmHg
AV Peak grad: 4 mmHg
AV Vena cont: 0.6 cm
Ao pk vel: 1 m/s
Area-P 1/2: 1.86 cm2
Height: 63 in
P 1/2 time: 746 msec
S' Lateral: 3.9 cm
Weight: 1834.23 oz

## 2022-05-18 LAB — BASIC METABOLIC PANEL
Anion gap: 10 (ref 5–15)
BUN: 13 mg/dL (ref 8–23)
CO2: 26 mmol/L (ref 22–32)
Calcium: 8.9 mg/dL (ref 8.9–10.3)
Chloride: 100 mmol/L (ref 98–111)
Creatinine, Ser: 1.03 mg/dL — ABNORMAL HIGH (ref 0.44–1.00)
GFR, Estimated: 51 mL/min — ABNORMAL LOW (ref >60)
Glucose, Bld: 97 mg/dL (ref 70–99)
Potassium: 4.2 mmol/L (ref 3.5–5.1)
Sodium: 136 mmol/L (ref 135–145)

## 2022-05-18 MED ORDER — SPIRONOLACTONE 12.5 MG HALF TABLET
12.5000 mg | ORAL_TABLET | Freq: Every day | ORAL | Status: DC
  Administered 2022-05-18 – 2022-05-20 (×3): 12.5 mg via ORAL
  Filled 2022-05-18 (×3): qty 1

## 2022-05-18 MED ORDER — IRBESARTAN 300 MG PO TABS
150.0000 mg | ORAL_TABLET | Freq: Every day | ORAL | Status: DC
  Administered 2022-05-19 – 2022-05-20 (×2): 150 mg via ORAL
  Filled 2022-05-18 (×2): qty 1

## 2022-05-18 MED ORDER — FUROSEMIDE 20 MG PO TABS
20.0000 mg | ORAL_TABLET | Freq: Every day | ORAL | Status: DC
  Administered 2022-05-19: 20 mg via ORAL
  Filled 2022-05-18: qty 1

## 2022-05-18 MED ORDER — CARVEDILOL 6.25 MG PO TABS
6.2500 mg | ORAL_TABLET | Freq: Two times a day (BID) | ORAL | Status: DC
  Administered 2022-05-18 – 2022-05-20 (×4): 6.25 mg via ORAL
  Filled 2022-05-18 (×4): qty 1

## 2022-05-18 NOTE — Assessment & Plan Note (Signed)
Continue blood pressure control with carvedilol, irbesartan (at home on olmesartan), and spironolactone.

## 2022-05-18 NOTE — Progress Notes (Addendum)
Progress Note   Patient: [name] DOB: 19-Aug-1928 DOA: 05/17/2022     1 DOS: the patient was seen and examined on 05/18/2022   Brief hospital course: [name] was admitted to the hospital with the working diagnosis of heart failure exacerbation, complicated with atrial fibrillation with RVR.  89 yo female with the past medical history of heart failure, paroxysmal atrial fibrillation, hypertension, pulmonary hypertension, and coronary artery disease who presented with dyspnea, cough and edema. Reported 3 weeks of cough and dyspnea, worse over the last week, that progressed to severe dyspnea and new lower extremity edema. 24 hrs prior to her hospitalization she was diagnosed with volume overload and had furosemide prescribed, unfortunately she had no significant improvement, prompting her to come to the ED. On her initial physical examination her blood pressure was 170/100, HR 82, RR 30, 02 saturation 94%, lungs with rales bilaterally, with increased work of breathing, no wheezing, cardiovascular with S1 and S2 present, regular with no murmurs or gallops, abdomen with no distention and positive lower extremity edema.   Na 138, K 3,5 Cl 102 bicarbonate 24, glucose 101, bun 12 cr 1,16  BNP 1,639  High sensitive troponin 27 and 30  Lactic acid 2,0 and 1,1  Wbc 7,4 hgb 11,9 plt 260  Urine analysis SG 1,013, protein 100, negative leukocytes.   Chest radiograph with cardiomegaly, bilateral hilar vascular congestion and small bilateral pleural effusions.   EKG 79 bpm, left axis deviation, left anterior fascicular block, right bundle branch block, sinus rhythm with no significant ST segment or T wave changes.   Patient developed tachycardia rate of 145 bpm, treated with IV digoxin.  Telemetry with atrial flutter and atrial fibrillation  03/10 converted to sinus rhythm.   Assessment and Plan: * Paroxysmal atrial fibrillation ([name]) Patient has converted to sinus rhythm, she had 4  doses of IV digoxin.   Plan to continue amiodarone 200 mg po daily. Increase carvedilol to 6.25 mg po bid (if blood pressure does not tolerate can change to metoprolol 25 mg po bid).  Continue anticoagulation with apixaban.   Acute on chronic systolic CHF (congestive heart failure) (HCC) Echocardiogram with reduced LV systolic function with EF 35 to 40%, mild LVH, the entire lateral wall, inferior septum, entire inferior wall and apex are hypokinetic, RV systolic function preserved, RVSP 57.5 mmHg, LA with moderate dilatation, MV repaired with adequate function, moderate TR.   Documented urine output is [id number] cc Systolic blood pressure is 130 to 120 mmHg.   Plan to continue with carvedilol (increased dose), decrease dose of irbersartan to 150 mg from 300 mg. Add spironolactone, and resume oral furosemide 20 mg po daily.   Essential hypertension Continue blood pressure control with carvedilol, irbesartan (at home on olmesartan), and add spironolactone.   Ascending aortic aneurysm ([name]) Follow up as outpatient Continue statin therapy.         Subjective: Patient is feeling better, she has converted to sinus rhythm, no chest pain, edema and dyspnea have improved.   Physical Exam: Vitals:   05/18/22 0056 05/18/22 0429 05/18/22 0816 05/18/22 0956  BP:  (!) 130/59 (!) 131/57 (!) 129/57  Pulse:  62 60 (!) 59  Resp: '18 18 20   '$ Temp: 98 F (36.7 C) 97.8 F (36.6 C) 97.7 F (36.5 C)   TempSrc: Oral Oral Oral   SpO2:  100% 100%   Weight: 52 kg     Height:       Neurology awake and alert  ENT with mild pallor Cardiovascular with S1 and S2 present and regular with no gallops or rubs, positive murmur at the right sternal border systolic No JVD Trace lower extremity edema Respiratory with no rales or wheezing Abdomen with no distention  Data Reviewed:    Family Communication: I spoke with patient's daughter at the bedside, we talked in detail about patient's condition, plan of  care and prognosis and all questions were addressed.   Disposition: Status is: Inpatient Remains inpatient appropriate because: heart failure and atrial fibrillation   Planned Discharge Destination: Home    Author: Tawni Millers, MD 05/18/2022 1:43 PM  For on call review [email or web address].

## 2022-05-18 NOTE — Progress Notes (Signed)
   05/17/22 1959  Assess: MEWS Score  Temp (!) 97.5 F (36.4 C)  BP 119/77  MAP (mmHg) 90  Pulse Rate (!) 124  ECG Heart Rate (!) 124  Resp 18  Level of Consciousness Alert  SpO2 90 %  Assess: MEWS Score  MEWS Temp 0  MEWS Systolic 0  MEWS Pulse 2  MEWS RR 0  MEWS LOC 0  MEWS Score 2  MEWS Score Color Yellow  Assess: if the MEWS score is Yellow or Red  Were vital signs taken at a resting state? Yes  Focused Assessment No change from prior assessment  Does the patient meet 2 or more of the SIRS criteria? No  MEWS guidelines implemented  Yes, yellow  Treat  MEWS Interventions Considered administering scheduled or prn medications/treatments as ordered  Take Vital Signs  Increase Vital Sign Frequency  Yellow: Q2hr x1, continue Q4hrs until patient remains green for 12hrs  Escalate  MEWS: Escalate Yellow: Discuss with charge nurse and consider notifying provider and/or RRT  Notify: Charge Nurse/RN  Name of Charge Nurse/RN Notified Scientist, product/process development Name/Title NA  Notify: Rapid Response  Name of Rapid Response RN Notified NA  Assess: SIRS CRITERIA  SIRS Temperature  0  SIRS Pulse 1  SIRS Respirations  0  SIRS WBC 0  SIRS Score Sum  1   Patient was previous red and yellow in the ED for the same reason, will follow yellow mews, patient is however anxious

## 2022-05-18 NOTE — Plan of Care (Signed)
PT alert and oriented. Able to make needs known. Was titrated off of oxygen this morning. Doing well on room air, SPO2 94-100%, drops slightly with speech and PO intake for a few seconds then recovers to high 90s. Daughter at bedside. IV dressing changed due to dry blood under existing dressing, tolerated well. Took all pills whole a couple at a time. Education provided for Potassium pill administration, PT stated she had trouble when taking it in the ED and that she usually takes pills with Ensure at home. PT and daughter made aware that alternative Potassium forms are available. Using call bell appropriately. Education also provided for fluid restriction.  Problem: Education: Goal: Knowledge of General Education information will improve Description: Including pain rating scale, medication(s)/side effects and non-pharmacologic comfort measures Outcome: Progressing   Problem: Health Behavior/Discharge Planning: Goal: Ability to manage health-related needs will improve Outcome: Progressing   Problem: Clinical Measurements: Goal: Ability to maintain clinical measurements within normal limits will improve Outcome: Progressing Goal: Will remain free from infection Outcome: Progressing Goal: Diagnostic test results will improve Outcome: Progressing Goal: Respiratory complications will improve Outcome: Progressing Goal: Cardiovascular complication will be avoided Outcome: Progressing   Problem: Activity: Goal: Risk for activity intolerance will decrease Outcome: Progressing   Problem: Nutrition: Goal: Adequate nutrition will be maintained Outcome: Progressing   Problem: Coping: Goal: Level of anxiety will decrease Outcome: Progressing   Problem: Elimination: Goal: Will not experience complications related to bowel motility Outcome: Progressing Goal: Will not experience complications related to urinary retention Outcome: Progressing   Problem: Pain Managment: Goal: General  experience of comfort will improve Outcome: Progressing   Problem: Safety: Goal: Ability to remain free from injury will improve Outcome: Progressing   Problem: Skin Integrity: Goal: Risk for impaired skin integrity will decrease Outcome: Progressing

## 2022-05-18 NOTE — Hospital Course (Addendum)
[name] was admitted to the hospital with the working diagnosis of heart failure exacerbation, complicated with atrial fibrillation with RVR.  89 yo female with the past medical history of heart failure, paroxysmal atrial fibrillation, hypertension, pulmonary hypertension, and coronary artery disease who presented with dyspnea, cough and edema. Reported 3 weeks of cough and dyspnea, worse over the last week, that progressed to severe dyspnea and new lower extremity edema. 24 hrs prior to her hospitalization she was diagnosed with volume overload and had furosemide prescribed, unfortunately she had no significant improvement, prompting her to come to the ED. On her initial physical examination her blood pressure was 170/100, HR 82, RR 30, 02 saturation 94%, lungs with rales bilaterally, with increased work of breathing, no wheezing, cardiovascular with S1 and S2 present, regular with no murmurs or gallops, abdomen with no distention and positive lower extremity edema.   Na 138, K 3,5 Cl 102 bicarbonate 24, glucose 101, bun 12 cr 1,16  BNP 1,639  High sensitive troponin 27 and 30  Lactic acid 2,0 and 1,1  Wbc 7,4 hgb 11,9 plt 260  Urine analysis SG 1,013, protein 100, negative leukocytes.   Chest radiograph with cardiomegaly, bilateral hilar vascular congestion and small bilateral pleural effusions.   EKG 79 bpm, left axis deviation, left anterior fascicular block, right bundle branch block, sinus rhythm with no significant ST segment or T wave changes.   Patient developed tachycardia rate of 145 bpm, treated with IV digoxin.  Telemetry with atrial flutter and atrial fibrillation  03/10 converted to sinus rhythm.  03/11 continue with hypervolemia.  03/12 volume status has improved, positive rise in serum cr, patient will follow up as outpatient renal function. Resume furosemide tomorrow 04/22/22

## 2022-05-18 NOTE — Evaluation (Signed)
Physical Therapy Evaluation  Patient Details Name: [name] MRN: [id number] DOB: 01-Jun-1928 Today's Date: 05/18/2022  History of Present Illness  Pt is a 89 y/o female who presents 3/9 with CHF exacerbation, complicated with a-fib with RVR. PMH significant for CVA, HTN, mitral regurgitation, NSTEMI, orthostatic hypotension, pericarditis.   Clinical Impression  Pt admitted with above diagnosis. Pt currently with functional limitations due to the deficits listed below (see PT Problem List). At the time of PT eval pt was able to perform transfers and ambulation with gross min guard assist and RW for support. Pt is likely near baseline of function but is at a high risk for functional decline. Recommend continued PT follow up as well as mobility specialist follow up throughout admission to maximize functional independence and safety prior to d/c. Will continue to follow.    Recommendations for follow up therapy are one component of a multi-disciplinary discharge planning process, led by the attending physician.  Recommendations may be updated based on patient status, additional functional criteria and insurance authorization.  Follow Up Recommendations Home health PT      Assistance Recommended at Discharge Intermittent Supervision/Assistance  Patient can return home with the following  A little help with walking and/or transfers;A little help with bathing/dressing/bathroom;Assistance with cooking/housework;Assist for transportation;Help with stairs or ramp for entrance    Equipment Recommendations None recommended by PT  Recommendations for Other Services       Functional Status Assessment Patient has had a recent decline in their functional status and demonstrates the ability to make significant improvements in function in a reasonable and predictable amount of time.     Precautions / Restrictions Precautions Precautions: Fall Restrictions Weight Bearing Restrictions: No       Mobility  Bed Mobility               General bed mobility comments: Pt was received sitting up in bathroom washing up with NT present    Transfers Overall transfer level: Needs assistance Equipment used: Rolling walker (2 wheels) Transfers: Sit to/from Stand Sit to Stand: Min guard           General transfer comment: Close guard for safety as pt powered up to full stand. Increased time to prepare for stand>sit.    Ambulation/Gait Ambulation/Gait assistance: Min guard Gait Distance (Feet): 75 Feet Assistive device: Rolling walker (2 wheels) Gait Pattern/deviations: Step-through pattern, Decreased stride length, Trunk flexed, Narrow base of support Gait velocity: Decreased Gait velocity interpretation: <1.31 ft/sec, indicative of household ambulator   General Gait Details: Slow but generally steady with RW for support. Pt with flexed trunk and difficulty positioning close inside of the walker.  Stairs            Wheelchair Mobility    Modified Rankin (Stroke Patients Only)       Balance Overall balance assessment: Needs assistance Sitting-balance support: Feet supported, No upper extremity supported Sitting balance-Leahy Scale: Fair     Standing balance support: Bilateral upper extremity supported, During functional activity Standing balance-Leahy Scale: Fair Standing balance comment: Pt able to stand at the sink to brush teeth, hair, and manage dentures. Intermittent balance support on sink.                             Pertinent Vitals/Pain Pain Assessment Pain Assessment: No/denies pain    Home Living Family/patient expects to be discharged to:: Private residence Living Arrangements: Children Available Help at Discharge: Family;Available  24 hours/day Type of Home: House Home Access: Stairs to enter Entrance Stairs-Rails: Right;Left;Can reach both Entrance Stairs-Number of Steps: 2 in front 4 in the back   Home Layout: One  level Home Equipment: Conservation officer, nature (2 wheels);Cane - single point;BSC/3in1;Cane - quad;Grab bars - tub/shower      Prior Function Prior Level of Function : Independent/Modified Independent             Mobility Comments: Pt does not use an AD in the house at baseline but will carry the cane if she is going out. ADLs Comments: Pt does not drive, does not go out to the store, but is able to perform all ADL's without assist. She will go help clean at the church at times, but does not do stairs there. Family bringing in meals often.     Hand Dominance   Dominant Hand: Right    Extremity/Trunk Assessment   Upper Extremity Assessment Upper Extremity Assessment: Overall WFL for tasks assessed    Lower Extremity Assessment Lower Extremity Assessment: Generalized weakness    Cervical / Trunk Assessment Cervical / Trunk Assessment: Kyphotic (mild) Cervical / Trunk Exceptions: Forward head posture with rounded shoulders  Communication   Communication: No difficulties  Cognition Arousal/Alertness: Awake/alert Behavior During Therapy: WFL for tasks assessed/performed Overall Cognitive Status: Within Functional Limits for tasks assessed                                          General Comments      Exercises     Assessment/Plan    PT Assessment Patient needs continued PT services  PT Problem List Decreased strength;Decreased range of motion;Decreased activity tolerance;Decreased balance;Decreased mobility;Decreased knowledge of use of DME;Decreased safety awareness;Decreased knowledge of precautions;Cardiopulmonary status limiting activity       PT Treatment Interventions DME instruction;Gait training;Stair training;Functional mobility training;Therapeutic activities;Therapeutic exercise;Balance training;Patient/family education    PT Goals (Current goals can be found in the Care Plan section)  Acute Rehab PT Goals Patient Stated Goal: Return to her home  and PLOF PT Goal Formulation: With patient/family Time For Goal Achievement: 05/25/22 Potential to Achieve Goals: Good    Frequency Min 3X/week     Co-evaluation               AM-PAC PT "6 Clicks" Mobility  Outcome Measure Help needed turning from your back to your side while in a flat bed without using bedrails?: None Help needed moving from lying on your back to sitting on the side of a flat bed without using bedrails?: A Little Help needed moving to and from a bed to a chair (including a wheelchair)?: A Little Help needed standing up from a chair using your arms (e.g., wheelchair or bedside chair)?: A Little Help needed to walk in hospital room?: A Little Help needed climbing 3-5 steps with a railing? : A Little 6 Click Score: 19    End of Session   Activity Tolerance: Patient tolerated treatment well Patient left: in chair;with call bell/phone within reach;with chair alarm set;with family/visitor present Nurse Communication: Mobility status PT Visit Diagnosis: Unsteadiness on feet (R26.81);Difficulty in walking, not elsewhere classified (R26.2)    Time: [id number] PT Time Calculation (min) (ACUTE ONLY): 35 min   Charges:   PT Evaluation $PT Eval Moderate Complexity: 1 Mod PT Treatments $Gait Training: 8-22 mins        Rolinda Roan, PT,  DPT Acute Rehabilitation Services Secure Chat Preferred Office: [place] 05/18/2022, 2:21 PM

## 2022-05-18 NOTE — Assessment & Plan Note (Signed)
Follow up as outpatient Continue statin therapy.

## 2022-05-18 NOTE — Assessment & Plan Note (Signed)
Echocardiogram with reduction in her LV systolic function further to 30 to 35%, global hypokinesis, RV systolic function with mild reduction, RVSP 35,0 mmHg. Moderate aortic regurgitation.    Patient was placed on IV furosemide for diuresis, negative fluid balance was achieved, about 4 kg weight loss since admission.   Continue carvedilol and losartan. Holding spironolactone for now due to rise in serum cr, to consider starting mineralocorticoid receptor antagonist as outpatient.

## 2022-05-18 NOTE — Assessment & Plan Note (Signed)
Patient has converted to sinus rhythm, she had 4 doses of IV digoxin.   Plan to continue amiodarone 200 mg po daily. Increase carvedilol to 6.25 mg po bid (if blood pressure does not tolerate can change to metoprolol 25 mg po bid).  Continue anticoagulation with apixaban.

## 2022-05-19 DIAGNOSIS — N1831 Chronic kidney disease, stage 3a: Secondary | ICD-10-CM

## 2022-05-19 DIAGNOSIS — N189 Chronic kidney disease, unspecified: Secondary | ICD-10-CM

## 2022-05-19 DIAGNOSIS — I7121 Aneurysm of the ascending aorta, without rupture: Secondary | ICD-10-CM | POA: Diagnosis not present

## 2022-05-19 DIAGNOSIS — I480 Paroxysmal atrial fibrillation: Secondary | ICD-10-CM | POA: Diagnosis not present

## 2022-05-19 DIAGNOSIS — I5023 Acute on chronic systolic (congestive) heart failure: Secondary | ICD-10-CM | POA: Diagnosis not present

## 2022-05-19 DIAGNOSIS — I10 Essential (primary) hypertension: Secondary | ICD-10-CM | POA: Diagnosis not present

## 2022-05-19 LAB — BASIC METABOLIC PANEL
Anion gap: 12 (ref 5–15)
BUN: 17 mg/dL (ref 8–23)
CO2: 24 mmol/L (ref 22–32)
Calcium: 8.7 mg/dL — ABNORMAL LOW (ref 8.9–10.3)
Chloride: 100 mmol/L (ref 98–111)
Creatinine, Ser: 1.24 mg/dL — ABNORMAL HIGH (ref 0.44–1.00)
GFR, Estimated: 41 mL/min — ABNORMAL LOW (ref >60)
Glucose, Bld: 90 mg/dL (ref 70–99)
Potassium: 3.9 mmol/L (ref 3.5–5.1)
Sodium: 136 mmol/L (ref 135–145)

## 2022-05-19 LAB — MAGNESIUM: Magnesium: 1.9 mg/dL (ref 1.7–2.4)

## 2022-05-19 MED ORDER — FUROSEMIDE 10 MG/ML IJ SOLN
60.0000 mg | Freq: Once | INTRAMUSCULAR | Status: AC
  Administered 2022-05-19: 60 mg via INTRAVENOUS
  Filled 2022-05-19: qty 6

## 2022-05-19 NOTE — Evaluation (Signed)
Occupational Therapy Evaluation Patient Details Name: [name] MRN: [id number] DOB: [birth year] Today's Date: 05/19/2022   History of Present Illness Pt is a 89 y/o female who presents 3/9 with CHF exacerbation, complicated with a-fib with RVR. PMH significant for CVA, HTN, mitral regurgitation, NSTEMI, orthostatic hypotension, pericarditis.   Clinical Impression   Pt presents with decline in function and safety with ADLs and ADL mobility with impaired balance and endurance. PTA, pt lived at home with her son and was Ind with ADLs/selfcare, used cane for mobility and did not drive. Pt currently requires min guard A with LB ADLs and standing ADL and ADL mobility tasks using RW for safety. Pt would benefit from acute OT services to address impairments to maximize level of function and safety      Recommendations for follow up therapy are one component of a multi-disciplinary discharge planning process, led by the attending physician.  Recommendations may be updated based on patient status, additional functional criteria and insurance authorization.   Follow Up Recommendations  Home health OT     Assistance Recommended at Discharge Intermittent Supervision/Assistance  Patient can return home with the following A little help with bathing/dressing/bathroom;A little help with walking and/or transfers;Assistance with cooking/housework;Assist for transportation;Help with stairs or ramp for entrance    Functional Status Assessment  Patient has had a recent decline in their functional status and demonstrates the ability to make significant improvements in function in a reasonable and predictable amount of time.  Equipment Recommendations  None recommended by OT    Recommendations for Other Services       Precautions / Restrictions Precautions Precautions: Fall Restrictions Weight Bearing Restrictions: No      Mobility Bed Mobility Overal bed mobility: Modified Independent                   Transfers Overall transfer level: Needs assistance Equipment used: Rolling walker (2 wheels) Transfers: Sit to/from Stand Sit to Stand: Min guard                  Balance Overall balance assessment: Needs assistance Sitting-balance support: Feet supported, No upper extremity supported Sitting balance-Leahy Scale: Good     Standing balance support: Bilateral upper extremity supported, During functional activity Standing balance-Leahy Scale: Fair                             ADL either performed or assessed with clinical judgement   ADL Overall ADL's : Needs assistance/impaired Eating/Feeding: Independent   Grooming: Wash/dry hands;Wash/dry face;Oral care;Min guard;Standing   Upper Body Bathing: Set up   Lower Body Bathing: Min guard;Sit to/from stand   Upper Body Dressing : Set up   Lower Body Dressing: Min guard;Sit to/from stand   Toilet Transfer: Min guard;Ambulation;Rolling walker (2 wheels);Grab bars   Toileting- Clothing Manipulation and Hygiene: Min guard;Sit to/from stand       Functional mobility during ADLs: Min guard;Rolling walker (2 wheels)       Vision Baseline Vision/History: 1 Wears glasses Ability to See in Adequate Light: 0 Adequate Patient Visual Report: No change from baseline       Perception     Praxis      Pertinent Vitals/Pain Pain Assessment Pain Assessment: No/denies pain     Hand Dominance Right   Extremity/Trunk Assessment Upper Extremity Assessment Upper Extremity Assessment: Overall WFL for tasks assessed   Lower Extremity Assessment Lower Extremity Assessment: Defer to PT evaluation  Communication Communication Communication: No difficulties   Cognition   Behavior During Therapy: WFL for tasks assessed/performed Overall Cognitive Status: Within Functional Limits for tasks assessed                                       General Comments       Exercises      Shoulder Instructions      Home Living Family/patient expects to be discharged to:: Private residence Living Arrangements: Children Available Help at Discharge: Family;Available 24 hours/day Type of Home: House Home Access: Stairs to enter [employer] of Steps: 2 in front 4 in the back Entrance Stairs-Rails: Right;Left;Can reach both Home Layout: One level     Bathroom Shower/Tub: Teacher, early years/pre: Standard     Home Equipment: Conservation officer, nature (2 wheels);Cane - single point;BSC/3in1;Cane - quad;Grab bars - tub/shower          Prior Functioning/Environment Prior Level of Function : Independent/Modified Independent             Mobility Comments: Pt does not use an AD in the house at baseline but will carry the cane if she is going out. ADLs Comments: Pt does not drive, does not go out to the store, but is able to perform all ADL's without assist. She will go help clean at the church at times, but does not do stairs there. Family bringing in meals often. Bathes at sink        OT Problem List: Decreased activity tolerance;Impaired balance (sitting and/or standing)      OT Treatment/Interventions: Self-care/ADL training;Balance training;DME and/or AE instruction;Therapeutic exercise;Therapeutic activities;Patient/family education    OT Goals(Current goals can be found in the care plan section) Acute Rehab OT Goals Patient Stated Goal: go home OT Goal Formulation: With patient Time For Goal Achievement: 06/02/22 Potential to Achieve Goals: Good ADL Goals Pt Will Perform Grooming: with supervision;with set-up;standing Pt Will Perform Lower Body Bathing: with supervision;with set-up Pt Will Perform Lower Body Dressing: with supervision;with set-up;sit to/from stand Pt Will Transfer to Toilet: with supervision;ambulating;regular height toilet;grab bars Pt Will Perform Toileting - Clothing Manipulation and hygiene: with supervision;with modified  independence;sit to/from stand  OT Frequency: Min 2X/week    Co-evaluation              AM-PAC OT "6 Clicks" Daily Activity     Outcome Measure Help from another person eating meals?: None Help from another person taking care of personal grooming?: A Little Help from another person toileting, which includes using toliet, bedpan, or urinal?: A Little Help from another person bathing (including washing, rinsing, drying)?: A Little Help from another person to put on and taking off regular upper body clothing?: A Little Help from another person to put on and taking off regular lower body clothing?: A Little 6 Click Score: 19   End of Session Equipment Utilized During Treatment: Gait belt;Rolling walker (2 wheels)  Activity Tolerance: Patient tolerated treatment well Patient left: in chair;with call bell/phone within reach;with chair alarm set  OT Visit Diagnosis: Unsteadiness on feet (R26.81)                Time: [id number] OT Time Calculation (min): 25 min Charges:  OT General Charges $OT Visit: 1 Visit OT Evaluation $OT Eval Moderate Complexity: 1 Mod OT Treatments $Therapeutic Activity: 8-22 mins    Britt Bottom 05/19/2022, 11:57 AM

## 2022-05-19 NOTE — Progress Notes (Signed)
Progress Note   Patient: [name] [id number] DOB: Nov 09, 1928 DOA: 05/17/2022     2 DOS: the patient was seen and examined on 05/19/2022   Brief hospital course: Mrs. Radle was admitted to the hospital with the working diagnosis of heart failure exacerbation, complicated with atrial fibrillation with RVR.  89 yo female with the past medical history of heart failure, paroxysmal atrial fibrillation, hypertension, pulmonary hypertension, and coronary artery disease who presented with dyspnea, cough and edema. Reported 3 weeks of cough and dyspnea, worse over the last week, that progressed to severe dyspnea and new lower extremity edema. 24 hrs prior to her hospitalization she was diagnosed with volume overload and had furosemide prescribed, unfortunately she had no significant improvement, prompting her to come to the ED. On her initial physical examination her blood pressure was 170/100, HR 82, RR 30, 02 saturation 94%, lungs with rales bilaterally, with increased work of breathing, no wheezing, cardiovascular with S1 and S2 present, regular with no murmurs or gallops, abdomen with no distention and positive lower extremity edema.   Na 138, K 3,5 Cl 102 bicarbonate 24, glucose 101, bun 12 cr 1,16  BNP 1,639  High sensitive troponin 27 and 30  Lactic acid 2,0 and 1,1  Wbc 7,4 hgb 11,9 plt 260  Urine analysis SG 1,013, protein 100, negative leukocytes.   Chest radiograph with cardiomegaly, bilateral hilar vascular congestion and small bilateral pleural effusions.   EKG 79 bpm, left axis deviation, left anterior fascicular block, right bundle branch block, sinus rhythm with no significant ST segment or T wave changes.   Patient developed tachycardia rate of 145 bpm, treated with IV digoxin.  Telemetry with atrial flutter and atrial fibrillation  03/10 converted to sinus rhythm.  03/11 continue with hypervolemia.   Assessment and Plan: * Paroxysmal atrial fibrillation [facility]) Patient had  digoxin IV on admission.   Telemetry personally reviewed, sinus rhythm with rate 60 bpm.   Plan to continue amiodarone 200 mg po daily. Tolerating well carvedilol to 6.25 mg po bid   Continue anticoagulation with apixaban.   Acute on chronic systolic CHF (congestive heart failure) (HCC) Echocardiogram with reduction in her LV systolic function further to 30 to 35%, global hypokinesis, RV systolic function with mild reduction, RVSP 35,0 mmHg. Moderate aortic regurgitation.    Documented urine output is [id number] cc Systolic blood pressure is 119 to 159 mmHg.   Continue carvedilol, losartan and spironolactone. Today with hypervolemia, change furosemide to IV 60 mg once and follow up response.    Essential hypertension Continue blood pressure control with carvedilol, irbesartan (at home on olmesartan), and spironolactone.   Ascending aortic aneurysm ([place]) Follow up as outpatient Continue statin therapy.   CKD (chronic kidney disease) CKD 3a.   Renal function with serum cr at 1,2 with K at 3,9 and serum bicarbonate at 24. Na 136, Mg 1,9, Bun 17,.  Patient with hypervolemia today, will add one dose of IV furosemide 60 mg and will follow up renal function in am.        Subjective: Patient with improvement in her symptoms but not back to baseline, continue to have dyspnea and fatigue.   Physical Exam: Vitals:   05/18/22 2033 05/19/22 0021 05/19/22 0410 05/19/22 0740  BP: (!) 127/55 (!) 126/51 119/61 (!) 151/92  Pulse: 62 63 (!) 56 75  Resp: '17 18 17 17  '$ Temp: 97.7 F (36.5 C) 97.8 F (36.6 C) 97.8 F (36.6 C) 98.1 F (36.7 C)  TempSrc: Oral  Oral Oral Oral  SpO2: 94% 95% 93% 100%  Weight:   48.7 kg   Height:       Neurology awake and alert ENT with mild pallor Cardiovascular with S1 and S2 present and regular, positive S3 gallop, no rubs, positive systolic murmur at the right sternal border. Mild to moderate JVD Trace lower extremity edema Respiratory with mild rales at  bases with no wheezing or rhonchi Abdomen with no distention  Data Reviewed:    Family Communication: I spoke with patient's daughter at the bedside, we talked in detail about patient's condition, plan of care and prognosis and all questions were addressed.   Disposition: Status is: Inpatient Remains inpatient appropriate because: heart failure IV furosemide   Planned Discharge Destination: Home    Author: Tawni Millers, MD 05/19/2022 12:57 PM  For on call review [email or web address].

## 2022-05-19 NOTE — Progress Notes (Signed)
Heart Failure Navigator Progress Note  Assessed for Heart & Vascular TOC clinic readiness.  Patient has a scheduled [facility] appointment on 06/02/2022. .   Navigator will sign off at this time.    Earnestine Leys, BSN, Clinical cytogeneticist Only

## 2022-05-19 NOTE — Consult Note (Signed)
   [facility] CM Inpatient Consult   05/19/2022  [name] 1928/03/28 [id number]  [facility] [ACO] Patient: [name] HMO   Primary Care Provider:  [name], MD with [facility]  Patient screened for hospitalization with noted low risk score for unplanned readmission risk to assess for potential [place] [facility] service needs for post hospital transition for care coordination.  Review of patient's electronic medical record reveals patient is has had a history with [facility] RNCM in the past.  Met with patient and family at the bedside to explain ongoing follow up needs.  Patient was given a [facility] PCP appointment reminder card with a 24 hour nurse advise line magnet.  SDOH reviewed for transportation, food, or medication needs.  Patient denies any issues at this time. Patient and family were gracious and receptive.   Plan:  Continue to follow progress and disposition to assess for post hospital community care coordination/management needs.  Referral request for community care coordination: will refer back for support if needed care coordination pending findings.  Of note, [facility] Care Management/Population Health does not replace or interfere with any arrangements made by the Inpatient Transition of Care team.  For questions contact:   Natividad Brood, RN BSN [place]  [phone] business mobile phone Toll free office [phone]  *[place]  [phone] Fax number: [phone] [email or web address]

## 2022-05-19 NOTE — Plan of Care (Signed)

## 2022-05-19 NOTE — Assessment & Plan Note (Signed)
CKD 3a.   Renal function with serum cr at 1,2 with K at 3,9 and serum bicarbonate at 24. Na 136, Mg 1,9, Bun 17,.  Patient with hypervolemia today, will add one dose of IV furosemide 60 mg and will follow up renal function in am.

## 2022-05-19 NOTE — Progress Notes (Signed)
Mobility Specialist - Progress Note   05/19/22 1156  Mobility  Activity Ambulated with assistance in hallway  Level of Assistance Standby assist, set-up cues, supervision of patient - no hands on  Assistive Device Front wheel walker  Distance Ambulated (ft) 80 ft  Activity Response Tolerated well  Mobility Referral Yes  $Mobility charge 1 Mobility    Pt received in recliner and agreeable. No complaints on walk, tolerated distance well and no physical assistance needed. Left in bed w/ bed alarm on and call bell at her side.   [place] Specialist Please contact via SecureChat or Rehab office at [phone]

## 2022-05-20 ENCOUNTER — Other Ambulatory Visit (HOSPITAL_COMMUNITY): Payer: Self-pay

## 2022-05-20 DIAGNOSIS — I10 Essential (primary) hypertension: Secondary | ICD-10-CM | POA: Diagnosis not present

## 2022-05-20 DIAGNOSIS — N1831 Chronic kidney disease, stage 3a: Secondary | ICD-10-CM | POA: Diagnosis not present

## 2022-05-20 DIAGNOSIS — I5023 Acute on chronic systolic (congestive) heart failure: Secondary | ICD-10-CM | POA: Diagnosis not present

## 2022-05-20 DIAGNOSIS — I480 Paroxysmal atrial fibrillation: Secondary | ICD-10-CM | POA: Diagnosis not present

## 2022-05-20 LAB — BASIC METABOLIC PANEL
Anion gap: 11 (ref 5–15)
BUN: 18 mg/dL (ref 8–23)
CO2: 25 mmol/L (ref 22–32)
Calcium: 8.6 mg/dL — ABNORMAL LOW (ref 8.9–10.3)
Chloride: 100 mmol/L (ref 98–111)
Creatinine, Ser: 1.42 mg/dL — ABNORMAL HIGH (ref 0.44–1.00)
GFR, Estimated: 35 mL/min — ABNORMAL LOW (ref >60)
Glucose, Bld: 97 mg/dL (ref 70–99)
Potassium: 3.6 mmol/L (ref 3.5–5.1)
Sodium: 136 mmol/L (ref 135–145)

## 2022-05-20 MED ORDER — OLMESARTAN MEDOXOMIL 40 MG PO TABS: 20.0000 mg | ORAL_TABLET | Freq: Every day | ORAL | 3 refills | Status: DC

## 2022-05-20 MED ORDER — CARVEDILOL 6.25 MG PO TABS
6.2500 mg | ORAL_TABLET | Freq: Two times a day (BID) | ORAL | 0 refills | Status: DC
  Filled 2022-05-20: qty 60, 30d supply, fill #0

## 2022-05-20 MED ORDER — FUROSEMIDE 20 MG PO TABS: 20.0000 mg | ORAL_TABLET | Freq: Every day | ORAL | 3 refills | Status: DC

## 2022-05-20 NOTE — Discharge Summary (Signed)
Physician Discharge Summary   Patient: [name] MRN: [id number] DOB: Apr 06, 1928  Admit date:     05/17/2022  Discharge date: 05/20/22  Discharge Physician: Jimmy Picket Nashay Brickley   PCP: [name], MD   Recommendations at discharge:    Patient will need follow up renal function this week.  Resume furosemide tomorrow 05/21/23, increase in case of weight gain 2 to 3 lbs in 24 hrs or 5 lbs in 7 days.  Changed metoprolol to carvedilol.  Follow up with Atrial fibrillation clinic as scheduled.  Consider starting spironolactone as outpatient once renal function more stable.   I spoke with patient's daughter at the bedside, we talked in detail about patient's condition, plan of care and prognosis and all questions were addressed.   Discharge Diagnoses: Principal Problem:   Paroxysmal atrial fibrillation (HCC) Active Problems:   Acute on chronic systolic CHF (congestive heart failure) (HCC)   Essential hypertension   CKD (chronic kidney disease)   Ascending aortic aneurysm (HCC)  Resolved Problems:   * No resolved hospital problems. Orthopedic Surgical Hospital Course: [name] was admitted to the hospital with the working diagnosis of heart failure exacerbation, complicated with atrial fibrillation with RVR.  89 yo female with the past medical history of heart failure, paroxysmal atrial fibrillation, hypertension, pulmonary hypertension, and coronary artery disease who presented with dyspnea, cough and edema. Reported 3 weeks of cough and dyspnea, worse over the last week, that progressed to severe dyspnea and new lower extremity edema. 24 hrs prior to her hospitalization she was diagnosed with volume overload and had furosemide prescribed, unfortunately she had no significant improvement, prompting her to come to the ED. On her initial physical examination her blood pressure was 170/100, HR 82, RR 30, 02 saturation 94%, lungs with rales bilaterally, with increased work of breathing, no wheezing,  cardiovascular with S1 and S2 present, regular with no murmurs or gallops, abdomen with no distention and positive lower extremity edema.   Na 138, K 3,5 Cl 102 bicarbonate 24, glucose 101, bun 12 cr 1,16  BNP 1,639  High sensitive troponin 27 and 30  Lactic acid 2,0 and 1,1  Wbc 7,4 hgb 11,9 plt 260  Urine analysis SG 1,013, protein 100, negative leukocytes.   Chest radiograph with cardiomegaly, bilateral hilar vascular congestion and small bilateral pleural effusions.   EKG 79 bpm, left axis deviation, left anterior fascicular block, right bundle branch block, sinus rhythm with no significant ST segment or T wave changes.   Patient developed tachycardia rate of 145 bpm, treated with IV digoxin.  Telemetry with atrial flutter and atrial fibrillation  03/10 converted to sinus rhythm.  03/11 continue with hypervolemia.  03/12 volume status has improved, positive rise in serum cr, patient will follow up as outpatient renal function. Resume furosemide tomorrow 04/22/22   Assessment and Plan: * Paroxysmal atrial fibrillation ([place]) Patient had digoxin IV on admission with improvement in heart rate and conversion to sinus rhythm.  Plan to continue amiodarone 200 mg po daily. Tolerating well carvedilol to 6.25 mg po bid   Continue anticoagulation with apixaban.   Acute on chronic systolic CHF (congestive heart failure) (HCC) Echocardiogram with reduction in her LV systolic function further to 30 to 35%, global hypokinesis, RV systolic function with mild reduction, RVSP 35,0 mmHg. Moderate aortic regurgitation.    Patient was placed on IV furosemide for diuresis, negative fluid balance was achieved, about 4 kg weight loss since admission.   Continue carvedilol and losartan. Holding spironolactone for  now due to rise in serum cr, to consider starting mineralocorticoid receptor antagonist as outpatient.    Essential hypertension Blood pressure improved, on admission was uncontrolled,  consistent with hypertension emergency.  At the time of her discharge she will continue carvedilol and losartan (reduced dose).  Follow up blood pressure as outpatient.   CKD (chronic kidney disease) AKI, CKD 3a.   Patient with improvement in volume status, renal function with serum cr at 1,42 with K at 3,6 and serum bicarbonate at 25.   Patient will continue furosemide tomorrow 02/13, hold on spironolactone for now.  Shared decision making with patient and her daughter at the bedside, plan to check renal function this week as outpatient. Expected to improve over the next few days, but if worsens patient will need to come back to the hospital for further treatment.   Ascending aortic aneurysm ([place]) Follow up as outpatient Continue statin therapy.          Consultants: none  Procedures performed: none   Disposition: Home Diet recommendation:  Cardiac diet DISCHARGE MEDICATION: Allergies as of 05/20/2022       Reactions   Donepezil Other (See Comments)   bradycardia   Actonel [risedronate Sodium] Nausea And Vomiting   Aspirin-caffeine    Upset stomach   Codeine    REACTION: upset stomach   Memantine    Penicillins    Sulfonamide Derivatives    REACTION: swollen tongue        Medication List     STOP taking these medications    metoprolol succinate 25 MG 24 hr tablet Commonly known as: TOPROL-XL       TAKE these medications    amiodarone 200 MG tablet Commonly known as: PACERONE Take one tablet by mouth daily What changed:  how much to take how to take this when to take this   apixaban 2.5 MG Tabs tablet Commonly known as: ELIQUIS Take 1 tablet (2.5 mg total) by mouth 2 (two) times daily.   atorvastatin 10 MG tablet Commonly known as: LIPITOR Take 1 tablet (10 mg total) by mouth daily.   Biotin 1000 MCG tablet Take 1,000 mcg by mouth daily.   carvedilol 6.25 MG tablet Commonly known as: COREG Take 1 tablet (6.25 mg total) by mouth 2 (two)  times daily with a meal.   cetirizine 5 MG tablet Commonly known as: ZYRTEC Take 1 tablet (5 mg total) by mouth daily.   cholecalciferol 25 MCG (1000 UT) tablet Generic drug: Cholecalciferol Take 1,000 Units by mouth daily.   furosemide 20 MG tablet Commonly known as: LASIX Take 1 tablet (20 mg total) by mouth daily. Take 2 tablets in case of weight gain 2 to 3 lbs in 24 hrs or 5 lbs in 7 days. Start taking on: May 21, 2022 What changed: additional instructions   olmesartan 40 MG tablet Commonly known as: Benicar Take 0.5 tablets (20 mg total) by mouth daily. For blood pressure What changed: how much to take        Follow-up Information     [facility] Follow up.   Specialty: [place] Why: Agency will call you to set up apt times Contact information: [address] [place] [address] [phone]                Discharge Exam: Filed Weights   05/18/22 0056 05/19/22 0410 05/20/22 0405  Weight: 52 kg 48.7 kg 47.9 kg   BP (!) 140/63 (BP Location:  Right Arm)   Pulse 68   Temp 97.9 F (36.6 C) (Oral)   Resp 16   Ht '5\' 3"'$  (1.6 m)   Wt 47.9 kg   SpO2 98%   BMI 18.71 kg/m   Patient is feeling better, no dyspnea or chest pain, no orthopnea or PND  Neurology awake and alert ENT with mild pallor Cardiovascular with S1 and S2 present and regular with no gallops, rubs or murmurs No JVD No lower extremity edema Respiratory with no wheezing or rales, no rhonchi Abdomen with no distention   Condition at discharge: stable  The results of significant diagnostics from this hospitalization (including imaging, microbiology, ancillary and laboratory) are listed below for reference.   Imaging Studies: ECHOCARDIOGRAM COMPLETE  Result Date: 05/18/2022    ECHOCARDIOGRAM REPORT   Patient Name:   ARLETHE GREENHILL Date of Exam: 05/18/2022 Medical Rec #:  [id number]    Height:       63.0 in Accession #:    [id number]   Weight:        114.6 lb Date of Birth:  10-23-1928    BSA:          1.526 m Patient Age:    6 years     BP:           134/58 mmHg Patient Gender: F            HR:           63 bpm. Exam Location:  Inpatient Procedure: 2D Echo, Cardiac Doppler, Color Doppler and 3D Echo Indications:    CHF-Acute Systolic [id number]  History:        Patient has prior history of Echocardiogram examinations, most                 recent 01/10/2021. CHF and Cardiomyopathy, Mitral Valve Disease;                 Risk Factors:Hypertension and Non-Smoker.  Sonographer:    Wilkie Aye RVT RCS Referring Phys: [id number] [place]  1. Left ventricular ejection fraction, by estimation, is 30 to 35%. The left ventricle has moderately decreased function. The left ventricle demonstrates global hypokinesis. Left ventricular diastolic parameters are indeterminate.  2. Right ventricular systolic function is mildly reduced. The right ventricular size is normal. There is normal pulmonary artery systolic pressure.  3. Left atrial size was mildly dilated.  4. Right atrial size was mildly dilated.  5. The mitral valve is abnormal. Mild mitral valve regurgitation. No evidence of mitral stenosis.  6. The tricuspid valve is abnormal.  7. The aortic valve is tricuspid. There is mild calcification of the aortic valve. There is mild thickening of the aortic valve. Aortic valve regurgitation is moderate. No aortic stenosis is present.  8. The inferior vena cava is normal in size with greater than 50% respiratory variability, suggesting right atrial pressure of 3 mmHg. FINDINGS  Left Ventricle: Left ventricular ejection fraction, by estimation, is 30 to 35%. The left ventricle has moderately decreased function. The left ventricle demonstrates global hypokinesis. The left ventricular internal cavity size was normal in size. There is no left ventricular hypertrophy. Left ventricular diastolic parameters are indeterminate. Right Ventricle: The right ventricular size is  normal. Right vetricular wall thickness was not well visualized. Right ventricular systolic function is mildly reduced. There is normal pulmonary artery systolic pressure. The tricuspid regurgitant velocity is 2.83 m/s, and with an assumed right atrial pressure of 3 mmHg, the estimated  right ventricular systolic pressure is [id number] mmHg. Left Atrium: Left atrial size was mildly dilated. Right Atrium: Right atrial size was mildly dilated. Pericardium: There is no evidence of pericardial effusion. Mitral Valve: The mitral valve is abnormal. Mild mitral valve regurgitation. No evidence of mitral valve stenosis. Tricuspid Valve: The tricuspid valve is abnormal. Tricuspid valve regurgitation is mild . No evidence of tricuspid stenosis. Aortic Valve: The aortic valve is tricuspid. There is mild calcification of the aortic valve. There is mild thickening of the aortic valve. There is mild aortic valve annular calcification. Aortic valve regurgitation is moderate. Aortic regurgitation PHT  measures 746 msec. No aortic stenosis is present. Aortic valve mean gradient measures 2.0 mmHg. Aortic valve peak gradient measures 4.0 mmHg. Aortic valve area, by VTI measures 2.35 cm. Pulmonic Valve: The pulmonic valve was not well visualized. Pulmonic valve regurgitation is mild. No evidence of pulmonic stenosis. Aorta: The aortic root and ascending aorta are structurally normal, with no evidence of dilitation. Venous: The inferior vena cava is normal in size with greater than 50% respiratory variability, suggesting right atrial pressure of 3 mmHg. IAS/Shunts: No atrial level shunt detected by color flow Doppler.  LEFT VENTRICLE PLAX 2D LVIDd:         4.80 cm LVIDs:         3.90 cm LV PW:         0.90 cm LV IVS:        0.80 cm LVOT diam:     2.30 cm   3D Volume EF: LV SV:         52        3D EF:        43 % LV SV Index:   34        LV EDV:       130 ml LVOT Area:     4.15 cm  LV ESV:       74 ml                          LV SV:         56 ml RIGHT VENTRICLE          IVC RV Basal diam:  3.20 cm  IVC diam: 1.00 cm LEFT ATRIUM             Index        RIGHT ATRIUM           Index LA diam:        4.00 cm 2.62 cm/m   RA Area:     15.90 cm LA Vol (A2C):   61.5 ml 40.29 ml/m  RA Volume:   46.90 ml  30.73 ml/m LA Vol (A4C):   58.1 ml 38.06 ml/m LA Biplane Vol: 60.9 ml 39.90 ml/m  AORTIC VALVE                    PULMONIC VALVE AV Area (Vmax):    2.80 cm     PV Vmax:          0.56 m/s AV Area (Vmean):   2.58 cm     PV Peak grad:     1.3 mmHg AV Area (VTI):     2.35 cm     PR End Diast Vel: 8.88 msec AV Vmax:           100.00 cm/s AV Vmean:  65.700 cm/s AV VTI:            0.223 m AV Peak Grad:      4.0 mmHg AV Mean Grad:      2.0 mmHg LVOT Vmax:         67.30 cm/s LVOT Vmean:        40.800 cm/s LVOT VTI:          0.126 m LVOT/AV VTI ratio: 0.57 AI PHT:            746 msec AR Vena Contracta: 0.60 cm  AORTA Ao Root diam: 3.25 cm Ao Arch diam: 2.7 cm MITRAL VALVE               TRICUSPID VALVE MV Area (PHT): 1.86 cm    TR Peak grad:   32.0 mmHg MV Decel Time: 408 msec    TR Vmax:        283.00 cm/s MV E velocity: 73.90 cm/s MV A velocity: 66.90 cm/s  SHUNTS MV E/A ratio:  1.10        Systemic VTI:  0.13 m                            Systemic Diam: 2.30 cm Carlyle Dolly MD Electronically signed by Carlyle Dolly MD Signature Date/Time: 05/18/2022/3:41:13 PM    Final    CT Angio Chest PE W and/or Wo Contrast  Result Date: 05/17/2022 CLINICAL DATA:  Fatigue with cough.  PE suspected. EXAM: CT ANGIOGRAPHY CHEST WITH CONTRAST TECHNIQUE: Multidetector CT imaging of the chest was performed using the standard protocol during bolus administration of intravenous contrast. Multiplanar CT image reconstructions and MIPs were obtained to evaluate the vascular anatomy. RADIATION DOSE REDUCTION: This exam was performed according to the departmental dose-optimization program which includes automated exposure control, adjustment of the mA and/or kV according  to patient size and/or use of iterative reconstruction technique. CONTRAST:  42m OMNIPAQUE IOHEXOL 350 MG/ML SOLN COMPARISON:  Chest CT 12/11/2021 FINDINGS: Cardiovascular: Heart is enlarged. No substantial pericardial effusion. Coronary artery calcification is evident. Mild atherosclerotic calcification is noted in the wall of the thoracic aorta. Ascending thoracic aorta measures 3.9 cm diameter. There is no filling defect within the opacified pulmonary arteries to suggest the presence of an acute pulmonary embolus. Mediastinum/Nodes: Mild mediastinal lymphadenopathy evident with 10 mm short axis precarinal node on 36/5 and 13 mm short axis subcarinal node on 60/5. There is no hilar lymphadenopathy. The esophagus has normal imaging features. There is no axillary lymphadenopathy. Lungs/Pleura: 16 mm perifissural left lower lobe pulmonary nodule on image 61/6 is new in the interval. Tiny peripheral clustered nodularity in the superior segment left lower lobe may be postobstructive or related to infectious/inflammatory etiology. Patchy ground-glass attenuation noted in both lungs with scattered additional tiny bilateral pulmonary nodules. Small right and tiny left pleural effusions evident. Upper Abdomen: Probable exophytic cyst upper pole right kidney is incompletely visualized but similar to study from 04/17/2021. Musculoskeletal: No worrisome lytic or sclerotic osseous abnormality. Review of the MIP images confirms the above findings. IMPRESSION: 1. No CT evidence for acute pulmonary embolus. 2. 16 mm perifissural left lower lobe pulmonary nodule, new in the interval. Neoplasm a concern. Close follow-up recommended. PET-CT may be warranted. 3. Tiny peripheral clustered nodularity in the superior segment left lower lobe may be postobstructive or related to infectious/inflammatory etiology. Attention on follow-up recommended. 4. Patchy ground-glass attenuation in both lungs with scattered additional tiny bilateral  pulmonary nodules. Imaging features may be infectious/inflammatory. 5. Small right and tiny left pleural effusions. 6.  Aortic Atherosclerosis (ICD10-I70.0). Electronically Signed   By: Misty Stanley M.D.   On: 05/17/2022 12:17   DG Chest 2 View  Result Date: 05/17/2022 CLINICAL DATA:  Shortness of breath. EXAM: CHEST - 2 VIEW COMPARISON:  05/05/2022 FINDINGS: Cardiomegaly and hyperinflation again noted. A small RIGHT pleural effusion is present. There is no evidence of airspace disease, pneumothorax or acute bony abnormality. IMPRESSION: 1. Cardiomegaly with small RIGHT pleural effusion. 2. Hyperinflation. Electronically Signed   By: Margarette Canada M.D.   On: 05/17/2022 10:47   DG Chest Port 1 View  Result Date: 05/17/2022 CLINICAL DATA:  Shortness of breath EXAM: PORTABLE CHEST 1 VIEW COMPARISON:  05/15/2022 and prior radiographs FINDINGS: Cardiomegaly noted with pulmonary vascular congestion. A trace RIGHT pleural effusion and equivocal trace LEFT pleural effusion noted. Mild bibasilar opacities are present. There is no evidence of pneumothorax or acute bony abnormality. IMPRESSION: 1. Cardiomegaly with pulmonary vascular congestion and trace RIGHT and equivocal trace LEFT pleural effusions. 2. Mild bibasilar opacities which may represent atelectasis or airspace disease/pneumonia. Electronically Signed   By: Margarette Canada M.D.   On: 05/17/2022 10:45   DG Chest 2 View  Result Date: 05/06/2022 CLINICAL DATA:  Cough.  Shortness of breath.  COPD. EXAM: CHEST - 2 VIEW COMPARISON:  CT chest 12/11/2021.  Chest x-ray 02/19/2021. FINDINGS: Mediastinum hilar structures are stable. Stable calcified left mediastinal lymph nodes and left upper lobe calcified nodule consistent granulomas disease again noted. Stable interstitial nodular prominence most likely chronic. Persistent right base infiltrate versus scarring. Persistent small right pleural effusion versus pleural scarring. Cardiomegaly. No pulmonary venous  congestion. Degenerative changes scoliosis thoracic spine. IMPRESSION: 1. Stable changes of prior granulomas disease. Stable interstitial nodular prominence most likely chronic. Persistent right base infiltrate versus scarring. Persistent small right pleural effusion versus pleural scarring. 2. Cardiomegaly. No pulmonary venous congestion. Electronically Signed   By: Marcello Moores  Register M.D.   On: 05/06/2022 09:54    Microbiology: Results for orders placed or performed during the hospital encounter of 05/17/22  Resp panel by RT-PCR (RSV, Flu A&B, Covid) Anterior Nasal Swab     Status: None   Collection Time: 05/17/22  9:38 AM   Specimen: Anterior Nasal Swab  Result Value Ref Range Status   SARS Coronavirus 2 by RT PCR NEGATIVE NEGATIVE Final   Influenza A by PCR NEGATIVE NEGATIVE Final   Influenza B by PCR NEGATIVE NEGATIVE Final    Comment: (NOTE) The Xpert Xpress SARS-CoV-2/FLU/RSV plus assay is intended as an aid in the diagnosis of influenza from Nasopharyngeal swab specimens and should not be used as a sole basis for treatment. Nasal washings and aspirates are unacceptable for Xpert Xpress SARS-CoV-2/FLU/RSV testing.  Fact Sheet for Patients: EntrepreneurPulse.com.au  Fact Sheet for Healthcare Providers: IncredibleEmployment.be  This test is not yet approved or cleared by the [place] FDA and has been authorized for detection and/or diagnosis of SARS-CoV-2 by FDA under an Emergency Use Authorization (EUA). This EUA will remain in effect (meaning this test can be used) for the duration of the COVID-19 declaration under Section 564(b)(1) of the Act, 21 U.S.C. section 360bbb-3(b)(1), unless the authorization is terminated or revoked.     Resp Syncytial Virus by PCR NEGATIVE NEGATIVE Final    Comment: (NOTE) Fact Sheet for Patients: EntrepreneurPulse.com.au  Fact Sheet for Healthcare  Providers: IncredibleEmployment.be  This test is not yet approved or cleared by the  [place] Architectural technologist and has been authorized for detection and/or diagnosis of SARS-CoV-2 by FDA under an Print production planner (EUA). This EUA will remain in effect (meaning this test can be used) for the duration of the COVID-19 declaration under Section 564(b)(1) of the Act, 21 U.S.C. section 360bbb-3(b)(1), unless the authorization is terminated or revoked.  Performed at [facility], [place] [address], [place], [place] [address]   Culture, blood (routine x 2)     Status: None (Preliminary result)   Collection Time: 05/17/22 10:05 AM   Specimen: BLOOD  Result Value Ref Range Status   Specimen Description BLOOD LEFT ANTECUBITAL  Final   Special Requests   Final    BOTTLES DRAWN AEROBIC AND ANAEROBIC Blood Culture adequate volume   Culture   Final    NO GROWTH 3 DAYS Performed at [facility], [place] [address], [place], [place] [address]    Report Status PENDING  Incomplete  Culture, blood (routine x 2)     Status: None (Preliminary result)   Collection Time: 05/17/22 10:12 AM   Specimen: BLOOD RIGHT ARM  Result Value Ref Range Status   Specimen Description BLOOD RIGHT ARM  Final   Special Requests   Final    BOTTLES DRAWN AEROBIC AND ANAEROBIC Blood Culture adequate volume   Culture   Final    NO GROWTH 3 DAYS Performed at [facility], [place] [address] [place], [place] [address]    Report Status PENDING  Incomplete    Labs: CBC: Recent Labs  Lab 05/17/22 0954  WBC 7.4  NEUTROABS 5.6  HGB 11.9*  HCT 36.9  MCV 97.1  PLT [id number]   Basic Metabolic Panel: Recent Labs  Lab 05/15/22 1550 05/17/22 0954 05/17/22 1200 05/18/22 0045 05/19/22 0037 05/20/22 0042  NA 140 138  --  136 136 136  K 3.9 3.5  --  4.2 3.9 3.6  CL 102 102  --  100 100 100  CO2 24 24  --  '26 24 25  '$ GLUCOSE 102* 101*  --  97 90 97  BUN 16 12  --  '13 17 18  '$ CREATININE 1.10*  1.16*  --  1.03* 1.24* 1.42*  CALCIUM 9.0 9.0  --  8.9 8.7* 8.6*  MG  --   --  1.9  --  1.9  --    Liver Function Tests: Recent Labs  Lab 05/15/22 1550 05/17/22 0954  AST 31 36  ALT 35* 31  ALKPHOS 105 89  BILITOT 0.7 0.8  PROT 6.3 6.4*  ALBUMIN 3.7 2.9*   CBG: Recent Labs  Lab 05/17/22 0957  GLUCAP 102*    Discharge time spent: greater than 30 minutes.  Signed: Tawni Millers, MD [facility] 05/20/2022

## 2022-05-20 NOTE — TOC Transition Note (Addendum)
Transition of Care [facility]) - CM/SW Discharge Note   Patient Details  Name: [name] MRN: [id number] Date of Birth: [birth year]  Transition of [facility] CM/SW Contact:  Zenon Mayo, RN Phone Number: 05/20/2022, 9:00 AM   Clinical Narrative:    Patient is for dc home today, NCM offered choice , she does not have a preference.  NCM made referral to [facility] with [facility]. He is able to take referral.  Soc will begin 24 to 48 hrs post dc.  Son will transport patient home today.  Daughter lives with patient. She has no other needs.   Final next level of care: Home w Home Health Services Barriers to Discharge: No Barriers Identified   Patient Goals and CMS Choice CMS Medicare.gov Compare Post Acute Care list provided to:: Patient Choice offered to / list presented to : Patient  Discharge Placement                         Discharge Plan and Services Additional resources added to the After Visit Summary for                  DME Arranged: N/A DME Agency: NA       HH Arranged: PT, OT RN [place] Agency: [place] Date [place]: 05/20/22 Time HH Agency Contacted: 0900 Representative spoke with at [place]: [place] ([place]) Interventions SDOH Screenings   Food Insecurity: No Food Insecurity (05/18/2022)  Housing: Low Risk  (05/18/2022)  Transportation Needs: No Transportation Needs (05/18/2022)  Utilities: Not At Risk (05/18/2022)  Alcohol Screen: Low Risk  (05/12/2022)  Depression (PHQ2-9): Low Risk  (05/15/2022)  Financial Resource Strain: Low Risk  (05/12/2022)  Physical Activity: Insufficiently Active (05/12/2022)  Social Connections: Moderately Integrated (05/12/2022)  Stress: No Stress Concern Present (05/12/2022)  Tobacco Use: Low Risk  (05/18/2022)     Readmission Risk Interventions     No data to display

## 2022-05-20 NOTE — Care Management Important Message (Signed)
Important Message  Patient Details  Name: [name] MRN: [id number] Date of Birth: 10-27-1928   Medicare Important Message Given:  Yes     Shelda Altes 05/20/2022, 9:25 AM

## 2022-05-20 NOTE — Plan of Care (Signed)

## 2022-05-21 ENCOUNTER — Telehealth: Payer: Self-pay | Admitting: *Deleted

## 2022-05-21 ENCOUNTER — Telehealth: Payer: Self-pay

## 2022-05-21 NOTE — Transitions of Care (Post Inpatient/ED Visit) (Signed)
   05/21/2022  Name: [name] MRN: [id number] DOB: 12-13-1928  Today's TOC FU Call Status: Today's TOC FU Call Status:: Successful TOC FU Call Competed TOC FU Call Complete Date: 05/21/22  Transition Care Management Follow-up Telephone Call Date of Discharge: 05/20/22 Discharge Facility: [facility]) Type of Discharge: Inpatient Admission Primary Inpatient Discharge Diagnosis:: Paroxysmal Atrial Fibrillation How have you been since you were released from the hospital?: Better (Per patients daughter, Clarene Critchley, she is very tired) Any questions or concerns?: No  Items Reviewed: Did you receive and understand the discharge instructions provided?: Yes Medications obtained and verified?: Yes (Medications Reviewed) Any new allergies since your discharge?: No Dietary orders reviewed?: No Do you have support at home?: Yes People in Home: child(ren), adult Name of Support/Comfort Primary Source: Daughter lives with patient  Home Care and Equipment/Supplies: [place] Ordered?: Yes Name of [facility]:: [place] Has Agency set up a time to come to your home?: No EMR reviewed for [place] Orders: Orders present/patient has not received call (refer to CM for follow-up) Any new equipment or medical supplies ordered?: No  Functional Questionnaire: Do you need assistance with bathing/showering or dressing?: Yes Do you need assistance with meal preparation?: Yes Do you need assistance with eating?: No Do you have difficulty maintaining continence: Yes Do you need assistance with getting out of bed/getting out of a chair/moving?: Yes Do you have difficulty managing or taking your medications?: Yes (Family prepares medications for a week at a time)  Follow up appointments reviewed: PCP Follow-up appointment confirmed?: Yes Date of PCP follow-up appointment?: 05/22/22 Follow-up Provider: Dr. Livia Snellen (bloodwork) [facility] Follow-up appointment confirmed?:  Yes Date of Specialist follow-up appointment?: 06/02/22 Follow-Up Specialty Provider:: Dr. Johnsie Cancel (cardiology) Do you need transportation to your follow-up appointment?: No Do you understand care options if your condition(s) worsen?: Yes-patient verbalized understanding  SDOH Interventions Today    Flowsheet Row Most Recent Value  SDOH Interventions   Food Insecurity Interventions Intervention Not Indicated  Housing Interventions Intervention Not Indicated  Transportation Interventions Intervention Not Indicated  Financial Strain Interventions Intervention Not Indicated      Johnney Killian, RN, BSN, CCM Care Management Coordinator [facility] Phone: [phone]: [phone]

## 2022-05-21 NOTE — Progress Notes (Unsigned)
  Care Coordination  Outreach Note  05/21/2022 Name: [name] MRN: [id number] DOB: [birth year]   Care Coordination Outreach Attempts: An unsuccessful telephone outreach was attempted today to offer the patient information about available care coordination services as a benefit of their health plan.   Follow Up Plan:  Additional outreach attempts will be made to offer the patient care coordination information and services.   Encounter Outcome:  No Answer  [place]  Direct Dial: [phone]

## 2022-05-22 ENCOUNTER — Other Ambulatory Visit: Payer: Medicare HMO

## 2022-05-22 ENCOUNTER — Other Ambulatory Visit: Payer: Self-pay | Admitting: *Deleted

## 2022-05-22 DIAGNOSIS — I10 Essential (primary) hypertension: Secondary | ICD-10-CM

## 2022-05-22 LAB — CULTURE, BLOOD (ROUTINE X 2)
Culture: NO GROWTH
Culture: NO GROWTH
Special Requests: ADEQUATE
Special Requests: ADEQUATE

## 2022-05-22 NOTE — Progress Notes (Signed)
  Care Coordination   Note   05/22/2022 Name: VALECIA BESKE MRN: [id number] DOB: 10/19/1928  ANNSLEY AKKERMAN is a 89 y.o. year old female who sees Stacks, Cletus Gash, MD for primary care. I reached out to [name] by phone today to offer care coordination services.  Ms. Lucatero was given information about Care Coordination services today including:   The Care Coordination services include support from the care team which includes your Nurse Coordinator, Clinical Social Worker, or Pharmacist.  The Care Coordination team is here to help remove barriers to the health concerns and goals most important to you. Care Coordination services are voluntary, and the patient may decline or stop services at any time by request to their care team member.   Care Coordination Consent Status: Patient agreed to services and verbal consent obtained.   Follow up plan:  Telephone appointment with care coordination team member scheduled for:  05/26/22  Encounter Outcome:  Pt. Scheduled  [place]  Direct Dial: [phone]

## 2022-05-23 ENCOUNTER — Encounter (HOSPITAL_COMMUNITY): Payer: Self-pay

## 2022-05-23 ENCOUNTER — Inpatient Hospital Stay (HOSPITAL_COMMUNITY)
Admission: EM | Admit: 2022-05-23 | Discharge: 2022-05-25 | DRG: 683 | Disposition: A | Discharge status: Home Health | Payer: Medicare HMO | Attending: Internal Medicine | Admitting: Internal Medicine

## 2022-05-23 ENCOUNTER — Other Ambulatory Visit: Payer: Self-pay | Admitting: Family

## 2022-05-23 ENCOUNTER — Other Ambulatory Visit: Payer: Self-pay

## 2022-05-23 ENCOUNTER — Emergency Department (HOSPITAL_COMMUNITY): Payer: Medicare HMO

## 2022-05-23 DIAGNOSIS — N1831 Chronic kidney disease, stage 3a: Secondary | ICD-10-CM | POA: Diagnosis present

## 2022-05-23 DIAGNOSIS — Z882 Allergy status to sulfonamides status: Secondary | ICD-10-CM

## 2022-05-23 DIAGNOSIS — I440 Atrioventricular block, first degree: Secondary | ICD-10-CM | POA: Diagnosis present

## 2022-05-23 DIAGNOSIS — Z8616 Personal history of COVID-19: Secondary | ICD-10-CM

## 2022-05-23 DIAGNOSIS — I130 Hypertensive heart and chronic kidney disease with heart failure and stage 1 through stage 4 chronic kidney disease, or unspecified chronic kidney disease: Secondary | ICD-10-CM | POA: Diagnosis present

## 2022-05-23 DIAGNOSIS — Z800 Family history of malignant neoplasm of digestive organs: Secondary | ICD-10-CM | POA: Diagnosis not present

## 2022-05-23 DIAGNOSIS — I5022 Chronic systolic (congestive) heart failure: Secondary | ICD-10-CM | POA: Diagnosis present

## 2022-05-23 DIAGNOSIS — Z7901 Long term (current) use of anticoagulants: Secondary | ICD-10-CM

## 2022-05-23 DIAGNOSIS — Z886 Allergy status to analgesic agent status: Secondary | ICD-10-CM | POA: Diagnosis not present

## 2022-05-23 DIAGNOSIS — N179 Acute kidney failure, unspecified: Secondary | ICD-10-CM

## 2022-05-23 DIAGNOSIS — Z880 Allergy status to penicillin: Secondary | ICD-10-CM

## 2022-05-23 DIAGNOSIS — N189 Chronic kidney disease, unspecified: Secondary | ICD-10-CM

## 2022-05-23 DIAGNOSIS — Z1152 Encounter for screening for COVID-19: Secondary | ICD-10-CM | POA: Diagnosis not present

## 2022-05-23 DIAGNOSIS — E785 Hyperlipidemia, unspecified: Secondary | ICD-10-CM | POA: Diagnosis present

## 2022-05-23 DIAGNOSIS — I252 Old myocardial infarction: Secondary | ICD-10-CM | POA: Diagnosis not present

## 2022-05-23 DIAGNOSIS — Z832 Family history of diseases of the blood and blood-forming organs and certain disorders involving the immune mechanism: Secondary | ICD-10-CM | POA: Diagnosis not present

## 2022-05-23 DIAGNOSIS — I480 Paroxysmal atrial fibrillation: Secondary | ICD-10-CM | POA: Diagnosis not present

## 2022-05-23 DIAGNOSIS — Z885 Allergy status to narcotic agent status: Secondary | ICD-10-CM

## 2022-05-23 DIAGNOSIS — Z79899 Other long term (current) drug therapy: Secondary | ICD-10-CM | POA: Diagnosis not present

## 2022-05-23 DIAGNOSIS — Z8249 Family history of ischemic heart disease and other diseases of the circulatory system: Secondary | ICD-10-CM | POA: Diagnosis not present

## 2022-05-23 DIAGNOSIS — E861 Hypovolemia: Secondary | ICD-10-CM | POA: Diagnosis not present

## 2022-05-23 DIAGNOSIS — Z888 Allergy status to other drugs, medicaments and biological substances status: Secondary | ICD-10-CM | POA: Diagnosis not present

## 2022-05-23 DIAGNOSIS — I10 Essential (primary) hypertension: Secondary | ICD-10-CM | POA: Diagnosis present

## 2022-05-23 DIAGNOSIS — Z952 Presence of prosthetic heart valve: Secondary | ICD-10-CM

## 2022-05-23 DIAGNOSIS — Z66 Do not resuscitate: Secondary | ICD-10-CM | POA: Diagnosis present

## 2022-05-23 DIAGNOSIS — Z8673 Personal history of transient ischemic attack (TIA), and cerebral infarction without residual deficits: Secondary | ICD-10-CM

## 2022-05-23 DIAGNOSIS — Z90710 Acquired absence of both cervix and uterus: Secondary | ICD-10-CM | POA: Diagnosis not present

## 2022-05-23 DIAGNOSIS — Z0389 Encounter for observation for other suspected diseases and conditions ruled out: Secondary | ICD-10-CM | POA: Diagnosis not present

## 2022-05-23 HISTORY — DX: Acute kidney failure, unspecified: N17.9

## 2022-05-23 LAB — BMP8+EGFR
BUN/Creatinine Ratio: 11 — ABNORMAL LOW (ref 12–28)
BUN: 36 mg/dL (ref 10–36)
CO2: 21 mmol/L (ref 20–29)
Calcium: 9.7 mg/dL (ref 8.7–10.3)
Chloride: 95 mmol/L — ABNORMAL LOW (ref 96–106)
Creatinine, Ser: 3.3 mg/dL (ref 0.57–1.00)
Glucose: 109 mg/dL — ABNORMAL HIGH (ref 70–99)
Potassium: 4.1 mmol/L (ref 3.5–5.2)
Sodium: 137 mmol/L (ref 134–144)
eGFR: 13 mL/min/{1.73_m2} — ABNORMAL LOW (ref >59)

## 2022-05-23 LAB — CBC WITH DIFFERENTIAL/PLATELET
Abs Immature Granulocytes: 0.1 10*3/uL — ABNORMAL HIGH (ref 0.00–0.07)
Basophils Absolute: 0.1 10*3/uL (ref 0.0–0.1)
Basophils Relative: 1 %
Eosinophils Absolute: 0.2 10*3/uL (ref 0.0–0.5)
Eosinophils Relative: 4 %
HCT: 38.2 % (ref 36.0–46.0)
Hemoglobin: 12.7 g/dL (ref 12.0–15.0)
Immature Granulocytes: 2 %
Lymphocytes Relative: 14 %
Lymphs Abs: 0.9 10*3/uL (ref 0.7–4.0)
MCH: 31.6 pg (ref 26.0–34.0)
MCHC: 33.2 g/dL (ref 30.0–36.0)
MCV: 95 fL (ref 80.0–100.0)
Monocytes Absolute: 0.7 10*3/uL (ref 0.1–1.0)
Monocytes Relative: 12 %
Neutro Abs: 4.1 10*3/uL (ref 1.7–7.7)
Neutrophils Relative %: 67 %
Platelets: 291 10*3/uL (ref 150–400)
RBC: 4.02 MIL/uL (ref 3.87–5.11)
RDW: 13.5 % (ref 11.5–15.5)
WBC: 6.1 10*3/uL (ref 4.0–10.5)
nRBC: 0 % (ref 0.0–0.2)

## 2022-05-23 LAB — URINALYSIS, ROUTINE W REFLEX MICROSCOPIC
Bilirubin Urine: NEGATIVE
Glucose, UA: NEGATIVE mg/dL
Hgb urine dipstick: NEGATIVE
Ketones, ur: NEGATIVE mg/dL
Nitrite: NEGATIVE
Protein, ur: NEGATIVE mg/dL
Specific Gravity, Urine: 1.005 (ref 1.005–1.030)
pH: 6 (ref 5.0–8.0)

## 2022-05-23 LAB — COMPREHENSIVE METABOLIC PANEL
ALT: 19 U/L (ref 0–44)
AST: 31 U/L (ref 15–41)
Albumin: 2.9 g/dL — ABNORMAL LOW (ref 3.5–5.0)
Alkaline Phosphatase: 75 U/L (ref 38–126)
Anion gap: 10 (ref 5–15)
BUN: 35 mg/dL — ABNORMAL HIGH (ref 8–23)
CO2: 25 mmol/L (ref 22–32)
Calcium: 9.1 mg/dL (ref 8.9–10.3)
Chloride: 102 mmol/L (ref 98–111)
Creatinine, Ser: 2.44 mg/dL — ABNORMAL HIGH (ref 0.44–1.00)
GFR, Estimated: 18 mL/min — ABNORMAL LOW (ref >60)
Glucose, Bld: 110 mg/dL — ABNORMAL HIGH (ref 70–99)
Potassium: 4.3 mmol/L (ref 3.5–5.1)
Sodium: 137 mmol/L (ref 135–145)
Total Bilirubin: 1.2 mg/dL (ref 0.3–1.2)
Total Protein: 6.2 g/dL — ABNORMAL LOW (ref 6.5–8.1)

## 2022-05-23 MED ORDER — ONDANSETRON HCL 4 MG PO TABS: 4.0000 mg | ORAL_TABLET | Freq: Four times a day (QID) | ORAL | Status: DC | PRN

## 2022-05-23 MED ORDER — SODIUM CHLORIDE 0.9 % IV SOLN: INTRAVENOUS | Status: AC

## 2022-05-23 MED ORDER — APIXABAN 2.5 MG PO TABS
2.5000 mg | ORAL_TABLET | Freq: Two times a day (BID) | ORAL | Status: DC
  Administered 2022-05-23 – 2022-05-25 (×4): 2.5 mg via ORAL
  Filled 2022-05-23 (×4): qty 1

## 2022-05-23 MED ORDER — ATORVASTATIN CALCIUM 10 MG PO TABS
10.0000 mg | ORAL_TABLET | Freq: Every day | ORAL | Status: DC
  Administered 2022-05-24 – 2022-05-25 (×2): 10 mg via ORAL
  Filled 2022-05-23 (×2): qty 1

## 2022-05-23 MED ORDER — ACETAMINOPHEN 325 MG PO TABS: 650.0000 mg | ORAL_TABLET | Freq: Four times a day (QID) | ORAL | Status: DC | PRN

## 2022-05-23 MED ORDER — CARVEDILOL 6.25 MG PO TABS
6.2500 mg | ORAL_TABLET | Freq: Two times a day (BID) | ORAL | Status: DC
  Administered 2022-05-24: 6.25 mg via ORAL
  Filled 2022-05-23: qty 1

## 2022-05-23 MED ORDER — ONDANSETRON HCL 4 MG/2ML IJ SOLN
4.0000 mg | Freq: Four times a day (QID) | INTRAMUSCULAR | Status: DC | PRN
  Administered 2022-05-25: 4 mg via INTRAVENOUS
  Filled 2022-05-23: qty 2

## 2022-05-23 MED ORDER — SODIUM CHLORIDE 0.9 % IV BOLUS
500.0000 mL | Freq: Once | INTRAVENOUS | Status: AC
  Administered 2022-05-23: 500 mL via INTRAVENOUS

## 2022-05-23 MED ORDER — AMIODARONE HCL 200 MG PO TABS
200.0000 mg | ORAL_TABLET | Freq: Every day | ORAL | Status: DC
  Administered 2022-05-24: 200 mg via ORAL
  Filled 2022-05-23: qty 1

## 2022-05-23 MED ORDER — SODIUM CHLORIDE 0.9% FLUSH
3.0000 mL | Freq: Two times a day (BID) | INTRAVENOUS | Status: DC
  Administered 2022-05-23 – 2022-05-25 (×4): 3 mL via INTRAVENOUS

## 2022-05-23 MED ORDER — SENNOSIDES-DOCUSATE SODIUM 8.6-50 MG PO TABS: 1.0000 | ORAL_TABLET | Freq: Every evening | ORAL | Status: DC | PRN

## 2022-05-23 MED ORDER — ACETAMINOPHEN 650 MG RE SUPP: 650.0000 mg | Freq: Four times a day (QID) | RECTAL | Status: DC | PRN

## 2022-05-23 NOTE — Assessment & Plan Note (Signed)
BP is stable.  Continue Coreg.  Holding Lasix and olmesartan as above.

## 2022-05-23 NOTE — ED Provider Notes (Signed)
[facility] Provider Note   CSN: [id number] Arrival date & time: 05/23/22  1259     History  Chief Complaint  Patient presents with   Abnormal Labs    [name] is a 89 y.o. female.  HPI Patient presents with her daughter who assists with the history.  She presents due to concern for fatigue, abnormal labs.  History is notable for recent admission for heart failure, and on the discharge that she was taking 40 mg Lasix daily.  Today on outpatient repeat check she was found to have evidence for acute kidney failure and encouraged to come here for evaluation.  She notes that after her weight went as high as almost 110 pounds that she has been about 100 pounds for the past 2 days.  She has been taking her medication regularly has been urinating frequently, she has no new chest pain, no new fever.     Home Medications Prior to Admission medications   Medication Sig Start Date End Date Taking? Authorizing Provider  amiodarone (PACERONE) 200 MG tablet Take one tablet by mouth daily Patient taking differently: Take 200 mg by mouth daily. Take one tablet by mouth daily 01/08/22   Sherran Needs, NP  apixaban (ELIQUIS) 2.5 MG TABS tablet Take 1 tablet (2.5 mg total) by mouth 2 (two) times daily. 02/19/22   Josue Hector, MD  atorvastatin (LIPITOR) 10 MG tablet Take 1 tablet (10 mg total) by mouth daily. 01/17/22   Josue Hector, MD  Biotin 1000 MCG tablet Take 1,000 mcg by mouth daily.    [provider]  carvedilol (COREG) 6.25 MG tablet Take 1 tablet (6.25 mg total) by mouth 2 (two) times daily with a meal. 05/20/22 06/19/22  Arrien, Jimmy Picket, MD  cetirizine (ZYRTEC) 5 MG tablet Take 1 tablet (5 mg total) by mouth daily. 02/08/21   Gwenlyn Perking, FNP  cholecalciferol 25 MCG (1000 UT) tablet Take 1,000 Units by mouth daily.    [provider]  furosemide (LASIX) 20 MG tablet Take 1 tablet (20 mg total) by mouth  daily. Take 2 tablets in case of weight gain 2 to 3 lbs in 24 hrs or 5 lbs in 7 days. 05/21/22   Arrien, Jimmy Picket, MD  olmesartan (BENICAR) 40 MG tablet Take 0.5 tablets (20 mg total) by mouth daily. For blood pressure 05/20/22   Arrien, Jimmy Picket, MD      Allergies    Donepezil, Actonel [risedronate sodium], Aspirin-caffeine, Codeine, Memantine, Penicillins, and Sulfonamide derivatives    Review of Systems   Review of Systems  All other systems reviewed and are negative.   Physical Exam Updated Vital Signs BP (!) 140/75   Pulse 60   Temp 97.9 F (36.6 C) (Oral)   Resp 20   SpO2 100%  Physical Exam Vitals and nursing note reviewed.  Constitutional:      General: She is not in acute distress.    Appearance: She is well-developed.  HENT:     Head: Normocephalic and atraumatic.  Eyes:     Conjunctiva/sclera: Conjunctivae normal.  Cardiovascular:     Rate and Rhythm: Normal rate and regular rhythm.  Pulmonary:     Effort: Pulmonary effort is normal. No respiratory distress.     Breath sounds: Normal breath sounds. No stridor.  Abdominal:     General: There is no distension.  Skin:    General: Skin is warm and dry.  Neurological:  Mental Status: She is alert and oriented to person, place, and time.     Cranial Nerves: No cranial nerve deficit.  Psychiatric:        Mood and Affect: Mood normal.     ED Results / Procedures / Treatments   Labs (all labs ordered are listed, but only abnormal results are displayed) Labs Reviewed  COMPREHENSIVE METABOLIC PANEL - Abnormal; Notable for the following components:      Result Value   Glucose, Bld 110 (*)    BUN 35 (*)    Creatinine, Ser 2.44 (*)    Total Protein 6.2 (*)    Albumin 2.9 (*)    GFR, Estimated 18 (*)    All other components within normal limits  CBC WITH DIFFERENTIAL/PLATELET - Abnormal; Notable for the following components:   Abs Immature Granulocytes 0.10 (*)    All other components within  normal limits  URINALYSIS, ROUTINE W REFLEX MICROSCOPIC  BRAIN NATRIURETIC PEPTIDE    EKG None  Radiology DG Chest 2 View  Result Date: 05/23/2022 CLINICAL DATA:  CHFCHF EXAM: CHEST - 2 VIEW COMPARISON:  05/17/2022 FINDINGS: Normal mediastinum and cardiac silhouette. Lungs are hyperinflated. Normal pulmonary vasculature. No evidence of effusion, infiltrate, or pneumothorax. Calcified mediastinal lymph node adjacent the aorta. No acute bony abnormality. IMPRESSION: No active cardiopulmonary disease.  Hyperinflated lungs Electronically Signed   By: Suzy Bouchard M.D.   On: 05/23/2022 18:01    Procedures Procedures    Medications Ordered in ED Medications  sodium chloride 0.9 % bolus 500 mL (has no administration in time range)    ED Course/ Medical Decision Making/ A&P                             Medical Decision Making Elderly female presents with fatigue in the context of recent increase Lasix dosing following admission for heart failure, now with concern for possible renal implications. Patient is awake and alert, but given concern from outpatient labs acute renal injury is a consideration as is electrolyte abnormalities. Patient placed on continuous cardiac monitoring, labs repeated x-ray ordered. Cardiac 60 sinus normal Pulse ox 99% room air normal   Amount and/or Complexity of Data Reviewed Independent Historian: caregiver External Data Reviewed: notes.    Details: Admission notes, and echocardiogram with EF 30/35% Labs: ordered. Decision-making details documented in ED Course. Radiology: ordered and independent interpretation performed. Decision-making details documented in ED Course. ECG/medicine tests: ordered and independent interpretation performed. Decision-making details documented in ED Course.  Risk Prescription drug management. Decision regarding hospitalization.   7:02 PM I have reviewed the x-ray, labs, findings with the patient and her  daughter. Patient is presenting with concern for possible renal injury in the context of recently increased Lasix dosing and labs are consistent with this with near doubling of her creatinine over the past 2 days.  Lungs are clear, she has no obvious lower extremity edema, she is likely sustaining some effects of iatrogenic cause.  Patient received small fluid bolus given acute renal injury, no evidence for concurrent new pneumonia, ACS or other acute phenomena.  Patient admitted for further monitoring, management.        Final Clinical Impression(s) / ED Diagnoses Final diagnoses:  AKI (acute kidney injury) ([place])     Carmin Muskrat, MD 05/23/22 [phone]

## 2022-05-23 NOTE — Assessment & Plan Note (Signed)
She is in sinus rhythm with controlled rate.  Continue amiodarone, Coreg, Eliquis.

## 2022-05-23 NOTE — H&P (Signed)
History and Physical    [name] [id number] DOB: 1928-04-18 DOA: 05/23/2022  PCP: [name], MD  Patient coming from: Home  I have personally briefly reviewed patient's old medical records in [place]  Chief Complaint: Renal dysfunction  HPI: [name] is a 89 y.o. female with medical history significant for HFrEF (EF 30-35%), PAF on Eliquis, severe mitral regurg s/p MVR, CKD stage IIIa, HTN who presented to the ED for evaluation of renal dysfunction.  Patient was recently admitted 05/17/2022-05/20/2022 for acute on chronic HFrEF exacerbation and A-fib with RVR.  She was diuresed with IV Lasix with negative fluid balance, about 4 kg weight loss during admission.  A-fib heart rate was improved with IV digoxin.  Her metoprolol was changed to carvedilol 6.25 mg BID.  She was discharged to home on her usual dose of Lasix 20 mg daily, amiodarone 200 mg daily, and olmesartan 20 mg daily.  Patient had hospital follow-up labs obtained yesterday.  She was called with results of acute kidney injury with creatinine up to 3.30 compared to baseline around 1.0-1.1.  She was advised to come to the ED for further evaluation and management.  Patient states that she has been well since she left the hospital.  She was fatigued yesterday but today was feeling good.  She has not had any shortness of breath, chest pain, lightheadedness, dizziness, nausea, vomiting, diarrhea.  She has not seen any swelling in her legs or feet.  She reports good urine output, states that she may have increased urinary frequency these last couple days.  No dysuria.  ED Course  Labs/Imaging on admission: I have personally reviewed following labs and imaging studies.  Initial vitals showed BP 140/75, pulse 61, RR 20, temp 97.9 F, SpO2 100% on room air.  Labs show WBC 6.1, hemoglobin 12.7, platelets 291,000, sodium 137, potassium 4.3, bicarb 25, BUN 35, creatinine 2.44, serum glucose 110, LFTs within normal  limits.  BMP and UA pending.  2 view chest x-ray negative for focal consolidation, edema, effusion.  Patient was given 500 cc normal saline and the hospitalist service was consulted to admit for further evaluation and management.  Review of Systems: All systems reviewed and are negative except as documented in history of present illness above.   Past Medical History:  Diagnosis Date   Arthritis    Atrial fibrillation ([place])    postoperative   COVID-19    CVA (cerebral vascular accident) ([place])    HTN (hypertension)    MR (mitral regurgitation)    severe   NSTEMI (non-ST elevated myocardial infarction) ([place])    Orthostatic hypotension 11/06/2011   Pericarditis    postoperative    Past Surgical History:  Procedure Laterality Date   ANTERIOR AND POSTERIOR VAGINAL REPAIR  10/15/04   Dr. Nori Riis    anterior repair with perigee graft  04/16/06   posterior repair with apogee graft; lynx mid urethral sling; sacrospinous ligamnet suspension and cystoscopy; Dr. Joya Martyr   MITRAL VALVE REPLACEMENT     sacral spinous ligament suspension of vagina     suprapubic cystectomy     TOTAL ABDOMINAL HYSTERECTOMY W/ BILATERAL SALPINGOOPHORECTOMY      Social History:  reports that she has never smoked. She has never used smokeless tobacco. She reports that she does not drink alcohol and does not use drugs.  Allergies  Allergen Reactions   Donepezil Other (See Comments)    bradycardia   Actonel [Risedronate Sodium] Nausea And Vomiting   Aspirin-Caffeine  Upset stomach    Codeine     REACTION: upset stomach   Memantine    Penicillins    Sulfonamide Derivatives     REACTION: swollen tongue    Family History  Problem Relation Age of Onset   Heart disease Father    Lupus Father    Heart attack Father    Heart disease Mother    Colon cancer Brother    Heart disease Brother      Prior to Admission medications   Medication Sig Start Date End Date Taking? Authorizing Provider   amiodarone (PACERONE) 200 MG tablet Take one tablet by mouth daily Patient taking differently: Take 200 mg by mouth daily. Take one tablet by mouth daily 01/08/22   Sherran Needs, NP  apixaban (ELIQUIS) 2.5 MG TABS tablet Take 1 tablet (2.5 mg total) by mouth 2 (two) times daily. 02/19/22   Josue Hector, MD  atorvastatin (LIPITOR) 10 MG tablet Take 1 tablet (10 mg total) by mouth daily. 01/17/22   Josue Hector, MD  Biotin 1000 MCG tablet Take 1,000 mcg by mouth daily.    [provider]  carvedilol (COREG) 6.25 MG tablet Take 1 tablet (6.25 mg total) by mouth 2 (two) times daily with a meal. 05/20/22 06/19/22  Arrien, Jimmy Picket, MD  cetirizine (ZYRTEC) 5 MG tablet Take 1 tablet (5 mg total) by mouth daily. 02/08/21   Gwenlyn Perking, FNP  cholecalciferol 25 MCG (1000 UT) tablet Take 1,000 Units by mouth daily.    [provider]  furosemide (LASIX) 20 MG tablet Take 1 tablet (20 mg total) by mouth daily. Take 2 tablets in case of weight gain 2 to 3 lbs in 24 hrs or 5 lbs in 7 days. 05/21/22   Arrien, Jimmy Picket, MD  olmesartan (BENICAR) 40 MG tablet Take 0.5 tablets (20 mg total) by mouth daily. For blood pressure 05/20/22   Arrien, Jimmy Picket, MD    Physical Exam: Vitals:   05/23/22 1726 05/23/22 1915 05/23/22 1930 05/23/22 1945  BP:  (!) 142/73    Pulse:  (!) 59 60 61  Resp:  15 17 19   Temp: 97.9 F (36.6 C)     TempSrc: Oral     SpO2:  100% 95% 96%   Constitutional: Resting in bed, NAD, calm, comfortable Eyes: EOMI, lids and conjunctivae normal ENMT: Mucous membranes are moist. Posterior pharynx clear of any exudate or lesions.Normal dentition.  Neck: normal, supple, no masses. Respiratory: clear to auscultation bilaterally, no wheezing, no crackles. Normal respiratory effort. No accessory muscle use.  Cardiovascular: Regular rate and rhythm, no murmurs / rubs / gallops. No extremity edema. 2+ pedal pulses. Abdomen: no tenderness, no masses  palpated.  Musculoskeletal: no clubbing / cyanosis. No joint deformity upper and lower extremities. Good ROM, no contractures. Normal muscle tone.  Skin: no rashes, lesions, ulcers. No induration Neurologic: Sensation intact. Strength 5/5 in all 4.  Psychiatric: Alert and oriented x 3. Normal mood.   EKG: Personally reviewed. Sinus rhythm, rate 64, first-degree AV block.  Tachyarrhythmia no longer present when compared to prior.  Assessment/Plan Principal Problem:   Acute kidney injury superimposed on chronic kidney disease stage IIIa (HCC) Active Problems:   Chronic HFrEF (heart failure with reduced ejection fraction) (HCC)   Paroxysmal atrial fibrillation (HCC)   Essential hypertension   Hyperlipidemia   Genna SADINA HAISTEN is a 89 y.o. female with medical history significant for HFrEF (EF 30-35%), PAF on Eliquis, severe mitral regurg  s/p MVR, CKD stage IIIa, HTN who is admitted with AKI on CKD stage IIIa.  Assessment and Plan: * Acute kidney injury superimposed on chronic kidney disease stage IIIa ([place]) Likely related to mild hypovolemia plus diuretic and ARB effect.  Creatinine 2.44 on admission compared to baseline between 1.0-1.1.  She reports good urine output. -Gentle IV fluid hydration overnight -Holding Lasix and olmesartan -Repeat labs in a.m.  Chronic HFrEF (heart failure with reduced ejection fraction) ([place]) Recently admitted for acute HFrEF exacerbation.  EF is 30-35% by TTE on 05/18/2022.  She is now appearing euvolemic to mildly hypovolemic as above. -Holding Lasix and ARB -Continue Coreg -Monitor I/O's and daily weights  Paroxysmal atrial fibrillation (HCC) She is in sinus rhythm with controlled rate.  Continue amiodarone, Coreg, Eliquis.  Essential hypertension BP is stable.  Continue Coreg.  Holding Lasix and olmesartan as above.  Hyperlipidemia Continue atorvastatin.  DVT prophylaxis: apixaban (ELIQUIS) tablet 2.5 mg Start: 05/23/22 2200 apixaban (ELIQUIS) tablet  2.5 mg  Code Status: DNR, confirmed with patient on admission Family Communication: Daughter at bedside Disposition Plan: From home and likely discharge to home in 1-2 days Consults called: None Severity of Illness: The appropriate patient status for this patient is OBSERVATION. Observation status is judged to be reasonable and necessary in order to provide the required intensity of service to ensure the patient's safety. The patient's presenting symptoms, physical exam findings, and initial radiographic and laboratory data in the context of their medical condition is felt to place them at decreased risk for further clinical deterioration. Furthermore, it is anticipated that the patient will be medically stable for discharge from the hospital within 2 midnights of admission.   Zada Finders MD [facility]  If 7PM-7AM, please contact night-coverage [email or web address]  05/23/2022, 8:10 PM

## 2022-05-23 NOTE — ED Provider Triage Note (Signed)
Emergency Medicine Provider Triage Evaluation Note  [name] , a 89 y.o. female  was evaluated in triage.  Pt complains of abnormal lab value.  Have labs done at her primary care provider's office yesterday and found her creatinine to be elevated to 3.3 from a baseline of 1.4.  States she is eating and drinking like normal.  She is urinating slightly less than normal but denies dysuria or urgency.  Denies abdominal pain, nausea, vomiting, diarrhea.  Was recently discharged from the hospital.  Review of Systems  Positive: As above Negative: As above  Physical Exam  BP (!) 142/62 (BP Location: Right Arm)   Pulse 66   Temp 97.7 F (36.5 C) (Oral)   Resp (!) 22   SpO2 93%  Gen:   Awake, no distress   Resp:  Normal effort  MSK:   Moves extremities without difficulty  Other:    Medical Decision Making  Medically screening exam initiated at 1:18 PM.  Appropriate orders placed.  [name] was informed that the remainder of the evaluation will be completed by another provider, this initial triage assessment does not replace that evaluation, and the importance of remaining in the ED until their evaluation is complete.  Labs ordered   Roylene Reason, Hershal Coria 05/23/22 1319

## 2022-05-23 NOTE — Assessment & Plan Note (Signed)
Continue atorvastatin

## 2022-05-23 NOTE — ED Notes (Signed)
[facility] TO INPATIENT HANDOFF REPORT  ED Nurse Name and Phone #: [phone] Tris Howell  S Name/Age/Gender [name] 89 y.o. female Room/Bed: 018C/018C  Code Status   Code Status: Prior  Home/SNF/Other Home Patient oriented to: self, place, time, and situation Is this baseline? Yes   Triage Complete: Triage complete  Chief Complaint Acute kidney injury superimposed on chronic kidney disease ([place]) [N17.9, N18.9]  Triage Note Pt came in via POV d/t PCP recommendation to come in for evaluation d/t her labs showing "Kidney labs over 360." Denies any s/s of discomfort while in Triage.    Allergies Allergies  Allergen Reactions   Donepezil Other (See Comments)    bradycardia   Actonel [Risedronate Sodium] Nausea And Vomiting   Aspirin-Caffeine     Upset stomach    Codeine     REACTION: upset stomach   Memantine    Penicillins    Sulfonamide Derivatives     REACTION: swollen tongue    Level of Care/Admitting Diagnosis ED Disposition     ED Disposition  Admit   Condition  --   Comment  Hospital Area: [facility] [[id number]]  Level of Care: Telemetry Cardiac [[id number]]  May place patient in observation at [facility] or [place] if equivalent level of care is available:: No  Covid Evaluation: Asymptomatic - no recent exposure (last 10 days) testing not required  Diagnosis: Acute kidney injury superimposed on chronic kidney disease [facility]) [id number]  Admitting Physician: Lenore Cordia [id number]  Attending Physician: Lenore Cordia [id number]          B Medical/Surgery History Past Medical History:  Diagnosis Date   Arthritis    Atrial fibrillation ([place])    postoperative   COVID-19    CVA (cerebral vascular accident) ([place])    HTN (hypertension)    MR (mitral regurgitation)    severe   NSTEMI (non-ST elevated myocardial infarction) ([place])    Orthostatic hypotension 11/06/2011   Pericarditis    postoperative   Past Surgical History:  Procedure  Laterality Date   ANTERIOR AND POSTERIOR VAGINAL REPAIR  10/15/04   Dr. Nori Riis    anterior repair with perigee graft  04/16/06   posterior repair with apogee graft; lynx mid urethral sling; sacrospinous ligamnet suspension and cystoscopy; Dr. Joya Martyr   MITRAL VALVE REPLACEMENT     sacral spinous ligament suspension of vagina     suprapubic cystectomy     TOTAL ABDOMINAL HYSTERECTOMY W/ BILATERAL SALPINGOOPHORECTOMY       A IV Location/Drains/Wounds Patient Lines/Drains/Airways Status     Active Line/Drains/Airways     Name Placement date Placement time Site Days   Peripheral IV 05/23/22 22 G Anterior;Proximal;Right Forearm 05/23/22  1924  Forearm  less than 1            Intake/Output Last 24 hours No intake or output data in the 24 hours ending 05/23/22 1931  Labs/Imaging Results for orders placed or performed during the hospital encounter of 05/23/22 (from the past 48 hour(s))  Comprehensive metabolic panel     Status: Abnormal   Collection Time: 05/23/22  1:29 PM  Result Value Ref Range   Sodium 137 135 - 145 mmol/L   Potassium 4.3 3.5 - 5.1 mmol/L    Comment: HEMOLYSIS AT THIS LEVEL MAY AFFECT RESULT   Chloride 102 98 - 111 mmol/L   CO2 25 22 - 32 mmol/L   Glucose, Bld 110 (H) 70 - 99 mg/dL    Comment: Glucose  reference range applies only to samples taken after fasting for at least 8 hours.   BUN 35 (H) 8 - 23 mg/dL   Creatinine, Ser 2.44 (H) 0.44 - 1.00 mg/dL   Calcium 9.1 8.9 - 10.3 mg/dL   Total Protein 6.2 (L) 6.5 - 8.1 g/dL   Albumin 2.9 (L) 3.5 - 5.0 g/dL   AST 31 15 - 41 U/L    Comment: HEMOLYSIS AT THIS LEVEL MAY AFFECT RESULT   ALT 19 0 - 44 U/L    Comment: HEMOLYSIS AT THIS LEVEL MAY AFFECT RESULT   Alkaline Phosphatase 75 38 - 126 U/L   Total Bilirubin 1.2 0.3 - 1.2 mg/dL    Comment: HEMOLYSIS AT THIS LEVEL MAY AFFECT RESULT   GFR, Estimated 18 (L) >60 mL/min    Comment: (NOTE) Calculated using the CKD-EPI Creatinine Equation (2021)    Anion gap 10 5  - 15    Comment: Performed at [facility], [place] [address], [place], [place] [address]  CBC with Differential     Status: Abnormal   Collection Time: 05/23/22  1:29 PM  Result Value Ref Range   WBC 6.1 4.0 - 10.5 K/uL   RBC 4.02 3.87 - 5.11 MIL/uL   Hemoglobin 12.7 12.0 - 15.0 g/dL   HCT 38.2 36.0 - 46.0 %   MCV 95.0 80.0 - 100.0 fL   MCH 31.6 26.0 - 34.0 pg   MCHC 33.2 30.0 - 36.0 g/dL   RDW 13.5 11.5 - 15.5 %   Platelets 291 150 - 400 K/uL   nRBC 0.0 0.0 - 0.2 %   Neutrophils Relative % 67 %   Neutro Abs 4.1 1.7 - 7.7 K/uL   Lymphocytes Relative 14 %   Lymphs Abs 0.9 0.7 - 4.0 K/uL   Monocytes Relative 12 %   Monocytes Absolute 0.7 0.1 - 1.0 K/uL   Eosinophils Relative 4 %   Eosinophils Absolute 0.2 0.0 - 0.5 K/uL   Basophils Relative 1 %   Basophils Absolute 0.1 0.0 - 0.1 K/uL   Immature Granulocytes 2 %   Abs Immature Granulocytes 0.10 (H) 0.00 - 0.07 K/uL    Comment: Performed at [place] [address] [place], [place] [address]   DG Chest 2 View  Result Date: 05/23/2022 CLINICAL DATA:  CHFCHF EXAM: CHEST - 2 VIEW COMPARISON:  05/17/2022 FINDINGS: Normal mediastinum and cardiac silhouette. Lungs are hyperinflated. Normal pulmonary vasculature. No evidence of effusion, infiltrate, or pneumothorax. Calcified mediastinal lymph node adjacent the aorta. No acute bony abnormality. IMPRESSION: No active cardiopulmonary disease.  Hyperinflated lungs Electronically Signed   By: Suzy Bouchard M.D.   On: 05/23/2022 18:01    Pending Labs Unresulted Labs (From admission, onward)     Start     Ordered   05/23/22 1657  Brain natriuretic peptide  Once,   URGENT        05/23/22 1656   05/23/22 1318  Urinalysis, Routine w reflex microscopic -Urine, Clean Catch  Once,   URGENT       Question:  Specimen Source  Answer:  Urine, Clean Catch   05/23/22 1318            Vitals/Pain Today's Vitals   05/23/22 1310 05/23/22 1645 05/23/22 1726 05/23/22 1915  BP: (!)  142/62 (!) 140/75  (!) 142/73  Pulse: 66 60  (!) 59  Resp: (!) 22 20  15   Temp: 97.7 F (36.5 C)  97.9 F (36.6 C)  TempSrc: Oral  Oral   SpO2: 93% 100%  100%  PainSc:        Isolation Precautions No active isolations  Medications Medications  sodium chloride 0.9 % bolus 500 mL (500 mLs Intravenous New Bag/Given 05/23/22 1925)    Mobility walks with device     Focused Assessments Renal Assessment Handoff:  Hemodialysis Schedule: n/a Last Hemodialysis date and time: na   Restricted appendage:  na   R Recommendations: See Admitting Provider Note  Report given to:   Additional Notes:

## 2022-05-23 NOTE — Plan of Care (Signed)

## 2022-05-23 NOTE — Hospital Course (Signed)
[name] is a 89 y.o. female with medical history significant for HFrEF (EF 30-35%), PAF on Eliquis, severe mitral regurg s/p MVR, CKD stage IIIa, HTN who is admitted with AKI on CKD stage IIIa.

## 2022-05-23 NOTE — Assessment & Plan Note (Signed)
Recently admitted for acute HFrEF exacerbation.  EF is 30-35% by TTE on 05/18/2022.  She is now appearing euvolemic to mildly hypovolemic as above. -Holding Lasix and ARB -Continue Coreg -Monitor I/O's and daily weights

## 2022-05-23 NOTE — ED Triage Notes (Signed)
Pt came in via POV d/t PCP recommendation to come in for evaluation d/t her labs showing "Kidney labs over 360." Denies any s/s of discomfort while in Triage.

## 2022-05-23 NOTE — Assessment & Plan Note (Signed)
Likely related to mild hypovolemia plus diuretic and ARB effect.  Creatinine 2.44 on admission compared to baseline between 1.0-1.1.  She reports good urine output. -Gentle IV fluid hydration overnight -Holding Lasix and olmesartan -Repeat labs in a.m.

## 2022-05-24 DIAGNOSIS — N179 Acute kidney failure, unspecified: Secondary | ICD-10-CM | POA: Diagnosis not present

## 2022-05-24 DIAGNOSIS — E785 Hyperlipidemia, unspecified: Secondary | ICD-10-CM | POA: Diagnosis present

## 2022-05-24 DIAGNOSIS — Z886 Allergy status to analgesic agent status: Secondary | ICD-10-CM | POA: Diagnosis not present

## 2022-05-24 DIAGNOSIS — Z952 Presence of prosthetic heart valve: Secondary | ICD-10-CM | POA: Diagnosis not present

## 2022-05-24 DIAGNOSIS — Z66 Do not resuscitate: Secondary | ICD-10-CM | POA: Diagnosis present

## 2022-05-24 DIAGNOSIS — N189 Chronic kidney disease, unspecified: Secondary | ICD-10-CM | POA: Diagnosis not present

## 2022-05-24 DIAGNOSIS — Z8249 Family history of ischemic heart disease and other diseases of the circulatory system: Secondary | ICD-10-CM | POA: Diagnosis not present

## 2022-05-24 DIAGNOSIS — Z7901 Long term (current) use of anticoagulants: Secondary | ICD-10-CM | POA: Diagnosis not present

## 2022-05-24 DIAGNOSIS — Z90710 Acquired absence of both cervix and uterus: Secondary | ICD-10-CM | POA: Diagnosis not present

## 2022-05-24 DIAGNOSIS — N1831 Chronic kidney disease, stage 3a: Secondary | ICD-10-CM | POA: Diagnosis present

## 2022-05-24 DIAGNOSIS — Z800 Family history of malignant neoplasm of digestive organs: Secondary | ICD-10-CM | POA: Diagnosis not present

## 2022-05-24 DIAGNOSIS — I252 Old myocardial infarction: Secondary | ICD-10-CM | POA: Diagnosis not present

## 2022-05-24 DIAGNOSIS — Z79899 Other long term (current) drug therapy: Secondary | ICD-10-CM | POA: Diagnosis not present

## 2022-05-24 DIAGNOSIS — Z888 Allergy status to other drugs, medicaments and biological substances status: Secondary | ICD-10-CM | POA: Diagnosis not present

## 2022-05-24 DIAGNOSIS — I480 Paroxysmal atrial fibrillation: Secondary | ICD-10-CM | POA: Diagnosis present

## 2022-05-24 DIAGNOSIS — Z8673 Personal history of transient ischemic attack (TIA), and cerebral infarction without residual deficits: Secondary | ICD-10-CM | POA: Diagnosis not present

## 2022-05-24 DIAGNOSIS — Z1152 Encounter for screening for COVID-19: Secondary | ICD-10-CM | POA: Diagnosis not present

## 2022-05-24 DIAGNOSIS — Z832 Family history of diseases of the blood and blood-forming organs and certain disorders involving the immune mechanism: Secondary | ICD-10-CM | POA: Diagnosis not present

## 2022-05-24 DIAGNOSIS — I130 Hypertensive heart and chronic kidney disease with heart failure and stage 1 through stage 4 chronic kidney disease, or unspecified chronic kidney disease: Secondary | ICD-10-CM | POA: Diagnosis present

## 2022-05-24 DIAGNOSIS — Z880 Allergy status to penicillin: Secondary | ICD-10-CM | POA: Diagnosis not present

## 2022-05-24 DIAGNOSIS — I5022 Chronic systolic (congestive) heart failure: Secondary | ICD-10-CM | POA: Diagnosis present

## 2022-05-24 DIAGNOSIS — Z8616 Personal history of COVID-19: Secondary | ICD-10-CM | POA: Diagnosis not present

## 2022-05-24 DIAGNOSIS — Z882 Allergy status to sulfonamides status: Secondary | ICD-10-CM | POA: Diagnosis not present

## 2022-05-24 DIAGNOSIS — Z885 Allergy status to narcotic agent status: Secondary | ICD-10-CM | POA: Diagnosis not present

## 2022-05-24 DIAGNOSIS — E861 Hypovolemia: Secondary | ICD-10-CM | POA: Diagnosis present

## 2022-05-24 LAB — BASIC METABOLIC PANEL
Anion gap: 9 (ref 5–15)
BUN: 30 mg/dL — ABNORMAL HIGH (ref 8–23)
CO2: 24 mmol/L (ref 22–32)
Calcium: 8.6 mg/dL — ABNORMAL LOW (ref 8.9–10.3)
Chloride: 106 mmol/L (ref 98–111)
Creatinine, Ser: 1.73 mg/dL — ABNORMAL HIGH (ref 0.44–1.00)
GFR, Estimated: 27 mL/min — ABNORMAL LOW (ref >60)
Glucose, Bld: 104 mg/dL — ABNORMAL HIGH (ref 70–99)
Potassium: 3.2 mmol/L — ABNORMAL LOW (ref 3.5–5.1)
Sodium: 139 mmol/L (ref 135–145)

## 2022-05-24 LAB — BRAIN NATRIURETIC PEPTIDE: B Natriuretic Peptide: 412.8 pg/mL — ABNORMAL HIGH (ref 0.0–100.0)

## 2022-05-24 LAB — MAGNESIUM: Magnesium: 1.8 mg/dL (ref 1.7–2.4)

## 2022-05-24 MED ORDER — POTASSIUM CHLORIDE CRYS ER 20 MEQ PO TBCR
40.0000 meq | EXTENDED_RELEASE_TABLET | Freq: Once | ORAL | Status: AC
  Administered 2022-05-24: 40 meq via ORAL
  Filled 2022-05-24: qty 2

## 2022-05-24 MED ORDER — SODIUM CHLORIDE 0.9 % IV BOLUS
500.0000 mL | Freq: Once | INTRAVENOUS | Status: AC
  Administered 2022-05-24: 500 mL via INTRAVENOUS

## 2022-05-24 MED ORDER — GLUCAGON HCL RDNA (DIAGNOSTIC) 1 MG IJ SOLR: 5.0000 mg | Freq: Once | INTRAVENOUS | Status: DC

## 2022-05-24 MED ORDER — TRAZODONE HCL 50 MG PO TABS
25.0000 mg | ORAL_TABLET | Freq: Every evening | ORAL | Status: DC | PRN
  Administered 2022-05-24: 25 mg via ORAL
  Filled 2022-05-24: qty 1

## 2022-05-24 MED ORDER — AMIODARONE HCL 100 MG PO TABS
100.0000 mg | ORAL_TABLET | Freq: Every day | ORAL | Status: DC
  Administered 2022-05-25: 100 mg via ORAL
  Filled 2022-05-24: qty 1

## 2022-05-24 MED ORDER — POTASSIUM CHLORIDE 20 MEQ PO PACK
40.0000 meq | PACK | Freq: Once | ORAL | Status: AC
  Administered 2022-05-24: 40 meq via ORAL
  Filled 2022-05-24: qty 2

## 2022-05-24 NOTE — Progress Notes (Signed)
   05/24/22 1339  Assess: MEWS Score  Temp 97.6 F (36.4 C)  BP (!) 99/34  MAP (mmHg) (!) 55  Pulse Rate (!) 36  ECG Heart Rate (!) 37  Level of Consciousness Alert  SpO2 92 %  O2 Device Room Air  Assess: MEWS Score  MEWS Temp 0  MEWS Systolic 1  MEWS Pulse 2  MEWS RR 0  MEWS LOC 0  MEWS Score 3  MEWS Score Color Yellow  Assess: if the MEWS score is Yellow or Red  Were vital signs taken at a resting state? Yes  Focused Assessment Change from prior assessment (see assessment flowsheet)  Does the patient meet 2 or more of the SIRS criteria? No  Does the patient have a confirmed or suspected source of infection? No  Provider and Rapid Response Notified? No  MEWS guidelines implemented  Yes, yellow  Treat  MEWS Interventions Considered administering scheduled or prn medications/treatments as ordered  Take Vital Signs  Increase Vital Sign Frequency  Yellow: Q2hr x1, continue Q4hrs until patient remains green for 12hrs  Escalate  MEWS: Escalate Yellow: Discuss with charge nurse and consider notifying provider and/or RRT  Provider Notification  Provider Name/Title Dr. Broadus John  Date Provider Notified 05/24/22  Time Provider Notified 1330  Method of Notification Page  Notification Reason Change in status  Provider response See new orders  Date of Provider Response 05/24/22  Time of Provider Response 1335  Notify: Rapid Response  Name of Rapid Response RN Notified n/a  Assess: SIRS CRITERIA  SIRS Temperature  0  SIRS Pulse 0  SIRS Respirations  0  SIRS WBC 0  SIRS Score Sum  0   Pt experiencing ongoing tirenes with pauses/sinus Loletha Grayer, Low blp with Map 50-60s. MD aware 500 ml bolus with glucagon orders. D/c beta and calcium blockers.  Frequency vitals daughter at bedside

## 2022-05-24 NOTE — Progress Notes (Signed)
PROGRESS NOTE    [name]  [id number] DOB: 1928/03/21 DOA: 05/23/2022 PCP: Sharon Fraise, MD  89/F with chronic systolic CHF, EF [id number], severe mitral regurgitation status post MVR, paroxysmal A-fib on Eliquis, CKD 3 A, hypertension presented to the ED with abnormal labs, recently admitted 05/17/2022-05/20/2022 for acute on chronic HFrEF exacerbation and A-fib with RVR.  She was diuresed with IV Lasix,  A-fib heart rate was improved with IV digoxin.  Her metoprolol was changed to carvedilol 6.25 mg BID.  She was discharged to home on her usual dose of Lasix 20 mg daily, amiodarone 200 mg daily, and olmesartan 20 mg daily. -Patient subsequently had follow-up labs obtained 3/14 which noted creatinine of 3.3 compared to baseline of 1 and sent to the ED, vital signs stable, creatinine now 2.4  Subjective: -Feels better, no events overnight  Assessment and Plan:  Acute kidney injury superimposed on chronic kidney disease stage IIIa ([place]) Likely related to mild hypovolemia plus diuretic and ARB effect.  Creatinine 3.3 compared to baseline between 1.0-1.1.  -Hydrated with IV fluids overnight, stop further fluids today, creatinine improving down to 1.7 today, continue to hold diuretics 1 more day, hopefully resume diuretics tomorrow  Chronic HFrEF (heart failure with reduced ejection fraction) ([place]) Recently admitted for acute HFrEF exacerbation.  EF is 30-35% by TTE on 05/18/2022.   -Now euvolemic, fluids discontinued -See discussion above, discontinued ARB, likely resume low-dose Lasix tomorrow -Continue Coreg -Monitor I/O's and daily weights  Paroxysmal atrial fibrillation ([facility]) She is in sinus rhythm with controlled rate.  Continue amiodarone, Coreg, Eliquis.  Essential hypertension BP is stable.  Continue Coreg.  Holding Lasix and olmesartan as above.  Hyperlipidemia Continue atorvastatin.  DVT prophylaxis: Eliquis Code Status: DNR Family Communication: None present, called and  left message for daughter [name] Disposition Plan: Home likely tomorrow  Consultants:    Procedures:   Antimicrobials:    Objective: Vitals:   05/24/22 0048 05/24/22 0454 05/24/22 0500 05/24/22 0724  BP: (!) 148/55 (!) 148/57  139/66  Pulse: (!) 59 62  73  Resp: 19 19    Temp: 97.6 F (36.4 C) 98 F (36.7 C)  98 F (36.7 C)  TempSrc: Oral Oral  Oral  SpO2: 93% 95%  96%  Weight: 45 kg  45 kg   Height:        Intake/Output Summary (Last 24 hours) at 05/24/2022 1019 Last data filed at 05/24/2022 [id number] Gross per 24 hour  Intake 1014.87 ml  Output 300 ml  Net 714.87 ml   Filed Weights   05/23/22 2236 05/24/22 0048 05/24/22 0500  Weight: 45 kg 45 kg 45 kg    Examination:  General exam: Appears calm and comfortable  Respiratory system: Clear to auscultation Cardiovascular system: S1 & S2 heard, RRR.  Abd: nondistended, soft and nontender.Normal bowel sounds heard. Central nervous system: Alert and oriented. No focal neurological deficits. Extremities: no edema Skin: No rashes Psychiatry:  Mood & affect appropriate.     Data Reviewed:   CBC: Recent Labs  Lab 05/17/22 0954 05/23/22 1329  WBC 7.4 6.1  NEUTROABS 5.6 4.1  HGB 11.9* 12.7  HCT 36.9 38.2  MCV 97.1 95.0  PLT 260 [id number]   Basic Metabolic Panel: Recent Labs  Lab 05/17/22 1200 05/18/22 0045 05/19/22 0037 05/20/22 0042 05/22/22 1338 05/23/22 1329 05/24/22 0048  NA  --    < > 136 136 137 137 139  K  --    < > 3.9 3.6 4.1 4.3  3.2*  CL  --    < > 100 100 95* 102 106  CO2  --    < > 24 25 21 25 24   GLUCOSE  --    < > 90 97 109* 110* 104*  BUN  --    < > 17 18 36 35* 30*  CREATININE  --    < > 1.24* 1.42* 3.30* 2.44* 1.73*  CALCIUM  --    < > 8.7* 8.6* 9.7 9.1 8.6*  MG 1.9  --  1.9  --   --   --  1.8   < > = values in this interval not displayed.   GFR: Estimated Creatinine Clearance: 14.7 mL/min (A) (by C-G formula based on SCr of 1.73 mg/dL (H)). Liver Function Tests: Recent Labs  Lab  05/17/22 0954 05/23/22 1329  AST 36 31  ALT 31 19  ALKPHOS 89 75  BILITOT 0.8 1.2  PROT 6.4* 6.2*  ALBUMIN 2.9* 2.9*   Recent Labs  Lab 05/17/22 0954  LIPASE 32   No results for input(s): "AMMONIA" in the last 168 hours. Coagulation Profile: No results for input(s): "INR", "PROTIME" in the last 168 hours. Cardiac Enzymes: No results for input(s): "CKTOTAL", "CKMB", "CKMBINDEX", "TROPONINI" in the last 168 hours. BNP (last 3 results) No results for input(s): "PROBNP" in the last 8760 hours. HbA1C: No results for input(s): "HGBA1C" in the last 72 hours. CBG: Recent Labs  Lab 05/17/22 0957  GLUCAP 102*   Lipid Profile: No results for input(s): "CHOL", "HDL", "LDLCALC", "TRIG", "CHOLHDL", "LDLDIRECT" in the last 72 hours. Thyroid Function Tests: No results for input(s): "TSH", "T4TOTAL", "FREET4", "T3FREE", "THYROIDAB" in the last 72 hours. Anemia Panel: No results for input(s): "VITAMINB12", "FOLATE", "FERRITIN", "TIBC", "IRON", "RETICCTPCT" in the last 72 hours. Urine analysis:    Component Value Date/Time   COLORURINE STRAW (A) 05/23/2022 1833   APPEARANCEUR CLEAR 05/23/2022 1833   LABSPEC 1.005 05/23/2022 1833   PHURINE 6.0 05/23/2022 1833   GLUCOSEU NEGATIVE 05/23/2022 1833   HGBUR NEGATIVE 05/23/2022 1833   BILIRUBINUR NEGATIVE 05/23/2022 1833   BILIRUBINUR negative 08/09/2012 [place] 05/23/2022 1833   PROTEINUR NEGATIVE 05/23/2022 1833   UROBILINOGEN negative 08/09/2012 1203   NITRITE NEGATIVE 05/23/2022 1833   LEUKOCYTESUR TRACE (A) 05/23/2022 1833   Sepsis Labs: @LABRCNTIP (procalcitonin:4,lacticidven:4)  ) Recent Results (from the past 240 hour(s))  Resp panel by RT-PCR (RSV, Flu A&B, Covid) Anterior Nasal Swab     Status: None   Collection Time: 05/17/22  9:38 AM   Specimen: Anterior Nasal Swab  Result Value Ref Range Status   SARS Coronavirus 2 by RT PCR NEGATIVE NEGATIVE Final   Influenza A by PCR NEGATIVE NEGATIVE Final    Influenza B by PCR NEGATIVE NEGATIVE Final    Comment: (NOTE) The Xpert Xpress SARS-CoV-2/FLU/RSV plus assay is intended as an aid in the diagnosis of influenza from Nasopharyngeal swab specimens and should not be used as a sole basis for treatment. Nasal washings and aspirates are unacceptable for Xpert Xpress SARS-CoV-2/FLU/RSV testing.  Fact Sheet for Patients: EntrepreneurPulse.com.au  Fact Sheet for Healthcare Providers: IncredibleEmployment.be  This test is not yet approved or cleared by the [place] FDA and has been authorized for detection and/or diagnosis of SARS-CoV-2 by FDA under an Emergency Use Authorization (EUA). This EUA will remain in effect (meaning this test can be used) for the duration of the COVID-19 declaration under Section 564(b)(1) of the Act, 21 U.S.C. section 360bbb-3(b)(1), unless the  authorization is terminated or revoked.     Resp Syncytial Virus by PCR NEGATIVE NEGATIVE Final    Comment: (NOTE) Fact Sheet for Patients: EntrepreneurPulse.com.au  Fact Sheet for Healthcare Providers: IncredibleEmployment.be  This test is not yet approved or cleared by the [place] FDA and has been authorized for detection and/or diagnosis of SARS-CoV-2 by FDA under an Emergency Use Authorization (EUA). This EUA will remain in effect (meaning this test can be used) for the duration of the COVID-19 declaration under Section 564(b)(1) of the Act, 21 U.S.C. section 360bbb-3(b)(1), unless the authorization is terminated or revoked.  Performed at [facility], [place] [address] [place], [place] [address]   Culture, blood (routine x 2)     Status: None   Collection Time: 05/17/22 10:05 AM   Specimen: BLOOD  Result Value Ref Range Status   Specimen Description BLOOD LEFT ANTECUBITAL  Final   Special Requests   Final    BOTTLES DRAWN AEROBIC AND ANAEROBIC Blood Culture adequate  volume   Culture   Final    NO GROWTH 5 DAYS Performed at [facility], [place] [address] [place], [place] [address]    Report Status 05/22/2022 FINAL  Final  Culture, blood (routine x 2)     Status: None   Collection Time: 05/17/22 10:12 AM   Specimen: BLOOD RIGHT ARM  Result Value Ref Range Status   Specimen Description BLOOD RIGHT ARM  Final   Special Requests   Final    BOTTLES DRAWN AEROBIC AND ANAEROBIC Blood Culture adequate volume   Culture   Final    NO GROWTH 5 DAYS Performed at [facility], [place] [address], [place], [place] [address]    Report Status 05/22/2022 FINAL  Final     Radiology Studies: DG Chest 2 View  Result Date: 05/23/2022 CLINICAL DATA:  CHFCHF EXAM: CHEST - 2 VIEW COMPARISON:  05/17/2022 FINDINGS: Normal mediastinum and cardiac silhouette. Lungs are hyperinflated. Normal pulmonary vasculature. No evidence of effusion, infiltrate, or pneumothorax. Calcified mediastinal lymph node adjacent the aorta. No acute bony abnormality. IMPRESSION: No active cardiopulmonary disease.  Hyperinflated lungs Electronically Signed   By: Suzy Bouchard M.D.   On: 05/23/2022 18:01     Scheduled Meds:  amiodarone  200 mg Oral Daily   apixaban  2.5 mg Oral BID   atorvastatin  10 mg Oral Daily   carvedilol  6.25 mg Oral BID WC   potassium chloride  40 mEq Oral Once   sodium chloride flush  3 mL Intravenous Q12H   Continuous Infusions:   LOS: 0 days    Time spent: 25min    Domenic Polite, MD [facility]   05/24/2022, 10:19 AM

## 2022-05-24 NOTE — Plan of Care (Signed)

## 2022-05-24 NOTE — Progress Notes (Signed)
Patient arrives on unit accompanied by Automotive engineer. A&Ox4 verbalizing understanding of POC and unit orientation. Physical assessment to include 2Rn skin inspection complete; see flow sheet for details. VSS, pain/discomfort denied and no acute distress noted. Admission database complete, call bell and necessities within reach, [facility]

## 2022-05-25 DIAGNOSIS — N189 Chronic kidney disease, unspecified: Secondary | ICD-10-CM | POA: Diagnosis not present

## 2022-05-25 DIAGNOSIS — N179 Acute kidney failure, unspecified: Secondary | ICD-10-CM | POA: Diagnosis not present

## 2022-05-25 LAB — BASIC METABOLIC PANEL
Anion gap: 9 (ref 5–15)
BUN: 22 mg/dL (ref 8–23)
CO2: 22 mmol/L (ref 22–32)
Calcium: 9.1 mg/dL (ref 8.9–10.3)
Chloride: 107 mmol/L (ref 98–111)
Creatinine, Ser: 1.29 mg/dL — ABNORMAL HIGH (ref 0.44–1.00)
GFR, Estimated: 39 mL/min — ABNORMAL LOW (ref >60)
Glucose, Bld: 103 mg/dL — ABNORMAL HIGH (ref 70–99)
Potassium: 4.9 mmol/L (ref 3.5–5.1)
Sodium: 138 mmol/L (ref 135–145)

## 2022-05-25 MED ORDER — AMIODARONE HCL 200 MG PO TABS: 100.0000 mg | ORAL_TABLET | Freq: Every day | ORAL | Status: DC

## 2022-05-25 NOTE — Plan of Care (Signed)

## 2022-05-25 NOTE — Discharge Summary (Signed)
Physician Discharge Summary  JAZMERE STAILEY [id number] DOB: 1929/01/09 DOA: 05/23/2022  PCP: [name], MD  Admit date: 05/23/2022 Discharge date: 05/25/2022  Time spent: 45 minutes  Recommendations for Outpatient Follow-up:  Cardiology Dr. Johnsie Cancel on 3/25 ARB discontinued for AKI and Coreg discontinued for bradycardia/pauses   Discharge Diagnoses:  Principal Problem:   Acute kidney injury superimposed on chronic kidney disease stage IIIa ([place]) Active Problems:   Chronic HFrEF (heart failure with reduced ejection fraction) (HCC)   Paroxysmal atrial fibrillation ([place])   Essential hypertension   Hyperlipidemia   Discharge Condition: Improved  Diet recommendation: Low-sodium, heart healthy  Filed Weights   05/24/22 0500 05/25/22 0025 05/25/22 0232  Weight: 45 kg 45.5 kg 45.5 kg    History of present illness:  89/F with chronic systolic CHF, EF [id number], severe mitral regurgitation status post MVR, paroxysmal A-fib on Eliquis, CKD 3 A, hypertension presented to the ED with abnormal labs, recently admitted 05/17/2022-05/20/2022 for acute on chronic HFrEF exacerbation and A-fib with RVR.  She was diuresed with IV Lasix,  A-fib heart rate was improved with IV digoxin.  Her metoprolol was changed to carvedilol 6.25 mg BID.  She was discharged to home on her usual dose of Lasix 20 mg daily, amiodarone 200 mg daily, and olmesartan 20 mg daily. -Patient subsequently had follow-up labs obtained 3/14 which noted creatinine of 3.3 compared to baseline of 1 and sent to the ED  Hospital Course:   Acute kidney injury superimposed on chronic kidney disease stage IIIa ([place]) Likely related to mild hypovolemia plus diuretic and ARB effect.  Creatinine 3.3 compared to baseline between 1.0-1.1.  -Hydrated with IV fluids overnight on admission, creatinine improving down to 1.2 today, will resume Lasix 20 Mg daily scheduled with extra dose as needed for weight gain -Benicar discontinued   Chronic  HFrEF (heart failure with reduced ejection fraction) ([place]) Recently admitted for acute HFrEF exacerbation.  EF is 30-35% by TTE on 05/18/2022.   -Now euvolemic, fluids discontinued -See discussion above, ARB discontinued, low dose Lasix resumed at discharge, Coreg discontinued for severe sinus bradycardia with pauses in the 2-2.8-second range   Paroxysmal atrial fibrillation (HCC) -With sinus bradycardia and pauses yesterday, Coreg discontinued, amiodarone dose decreased to 100 Mg daily and Eliquis continued   Essential hypertension BP is stable, see discussion above   Hyperlipidemia Continue atorvastatin.   Discharge Exam: Vitals:   05/25/22 0349 05/25/22 0800  BP: (!) 145/67 (!) 142/55  Pulse: (!) 56 (!) 58  Resp: 17 16  Temp: 98.5 F (36.9 C) 98.2 F (36.8 C)  SpO2: 95% 95%   Gen: Awake, Alert, Oriented X 3, pleasant elderly, hard of hearing HEENT: no JVD Lungs: Good air movement bilaterally, CTAB CVS: S1S2/RRR Abd: soft, Non tender, non distended, BS present Extremities: No edema Skin: no new rashes on exposed skin   Discharge Instructions   Discharge Instructions     Diet - low sodium heart healthy   Complete by: As directed    Increase activity slowly   Complete by: As directed       Allergies as of 05/25/2022       Reactions   Donepezil Other (See Comments)   bradycardia   Actonel [risedronate Sodium] Nausea And Vomiting   Aspirin-caffeine Other (See Comments)   Upset stomach   Codeine Other (See Comments)   Upset stomach   Memantine Other (See Comments)   Unknown    Penicillins Other (See Comments)   Unknown  Sulfonamide Derivatives Other (See Comments)   Swollen tongue        Medication List     STOP taking these medications    carvedilol 6.25 MG tablet Commonly known as: COREG   metoprolol succinate 25 MG 24 hr tablet Commonly known as: TOPROL-XL   olmesartan 40 MG tablet Commonly known as: Benicar       TAKE these  medications    acetaminophen 325 MG tablet Commonly known as: TYLENOL Take 325 mg by mouth as needed for moderate pain.   amiodarone 200 MG tablet Commonly known as: PACERONE Take 0.5 tablets (100 mg total) by mouth daily. Take one tablet by mouth daily What changed:  how much to take how to take this when to take this   apixaban 2.5 MG Tabs tablet Commonly known as: ELIQUIS Take 1 tablet (2.5 mg total) by mouth 2 (two) times daily.   atorvastatin 10 MG tablet Commonly known as: LIPITOR Take 1 tablet (10 mg total) by mouth daily.   Biotin 1000 MCG tablet Take 1,000 mcg by mouth daily.   cetirizine 5 MG tablet Commonly known as: ZYRTEC Take 1 tablet (5 mg total) by mouth daily.   cholecalciferol 25 MCG (1000 UT) tablet Generic drug: Cholecalciferol Take 1,000 Units by mouth daily.   furosemide 20 MG tablet Commonly known as: LASIX Take 1 tablet (20 mg total) by mouth daily. Take 2 tablets in case of weight gain 2 to 3 lbs in 24 hrs or 5 lbs in 7 days. What changed: additional instructions       Allergies  Allergen Reactions   Donepezil Other (See Comments)    bradycardia   Actonel [Risedronate Sodium] Nausea And Vomiting   Aspirin-Caffeine Other (See Comments)    Upset stomach    Codeine Other (See Comments)    Upset stomach   Memantine Other (See Comments)    Unknown    Penicillins Other (See Comments)    Unknown    Sulfonamide Derivatives Other (See Comments)    Swollen tongue      The results of significant diagnostics from this hospitalization (including imaging, microbiology, ancillary and laboratory) are listed below for reference.    Significant Diagnostic Studies: DG Chest 2 View  Result Date: 05/23/2022 CLINICAL DATA:  CHFCHF EXAM: CHEST - 2 VIEW COMPARISON:  05/17/2022 FINDINGS: Normal mediastinum and cardiac silhouette. Lungs are hyperinflated. Normal pulmonary vasculature. No evidence of effusion, infiltrate, or pneumothorax. Calcified  mediastinal lymph node adjacent the aorta. No acute bony abnormality. IMPRESSION: No active cardiopulmonary disease.  Hyperinflated lungs Electronically Signed   By: Suzy Bouchard M.D.   On: 05/23/2022 18:01   ECHOCARDIOGRAM COMPLETE  Result Date: 05/18/2022    ECHOCARDIOGRAM REPORT   Patient Name:   KHALEYAH QASEM Date of Exam: 05/18/2022 Medical Rec #:  [id number]    Height:       63.0 in Accession #:    [id number]   Weight:       114.6 lb Date of Birth:  Jul 13, 1928    BSA:          1.526 m Patient Age:    40 years     BP:           134/58 mmHg Patient Gender: F            HR:           63 bpm. Exam Location:  Inpatient Procedure: 2D Echo, Cardiac Doppler, Color Doppler and 3D Echo Indications:  CHF-Acute Systolic [id number]  History:        Patient has prior history of Echocardiogram examinations, most                 recent 01/10/2021. CHF and Cardiomyopathy, Mitral Valve Disease;                 Risk Factors:Hypertension and Non-Smoker.  Sonographer:    Wilkie Aye RVT RCS Referring Phys: [id number] [place]  1. Left ventricular ejection fraction, by estimation, is 30 to 35%. The left ventricle has moderately decreased function. The left ventricle demonstrates global hypokinesis. Left ventricular diastolic parameters are indeterminate.  2. Right ventricular systolic function is mildly reduced. The right ventricular size is normal. There is normal pulmonary artery systolic pressure.  3. Left atrial size was mildly dilated.  4. Right atrial size was mildly dilated.  5. The mitral valve is abnormal. Mild mitral valve regurgitation. No evidence of mitral stenosis.  6. The tricuspid valve is abnormal.  7. The aortic valve is tricuspid. There is mild calcification of the aortic valve. There is mild thickening of the aortic valve. Aortic valve regurgitation is moderate. No aortic stenosis is present.  8. The inferior vena cava is normal in size with greater than 50% respiratory variability, suggesting  right atrial pressure of 3 mmHg. FINDINGS  Left Ventricle: Left ventricular ejection fraction, by estimation, is 30 to 35%. The left ventricle has moderately decreased function. The left ventricle demonstrates global hypokinesis. The left ventricular internal cavity size was normal in size. There is no left ventricular hypertrophy. Left ventricular diastolic parameters are indeterminate. Right Ventricle: The right ventricular size is normal. Right vetricular wall thickness was not well visualized. Right ventricular systolic function is mildly reduced. There is normal pulmonary artery systolic pressure. The tricuspid regurgitant velocity is 2.83 m/s, and with an assumed right atrial pressure of 3 mmHg, the estimated right ventricular systolic pressure is [id number] mmHg. Left Atrium: Left atrial size was mildly dilated. Right Atrium: Right atrial size was mildly dilated. Pericardium: There is no evidence of pericardial effusion. Mitral Valve: The mitral valve is abnormal. Mild mitral valve regurgitation. No evidence of mitral valve stenosis. Tricuspid Valve: The tricuspid valve is abnormal. Tricuspid valve regurgitation is mild . No evidence of tricuspid stenosis. Aortic Valve: The aortic valve is tricuspid. There is mild calcification of the aortic valve. There is mild thickening of the aortic valve. There is mild aortic valve annular calcification. Aortic valve regurgitation is moderate. Aortic regurgitation PHT  measures 746 msec. No aortic stenosis is present. Aortic valve mean gradient measures 2.0 mmHg. Aortic valve peak gradient measures 4.0 mmHg. Aortic valve area, by VTI measures 2.35 cm. Pulmonic Valve: The pulmonic valve was not well visualized. Pulmonic valve regurgitation is mild. No evidence of pulmonic stenosis. Aorta: The aortic root and ascending aorta are structurally normal, with no evidence of dilitation. Venous: The inferior vena cava is normal in size with greater than 50% respiratory variability,  suggesting right atrial pressure of 3 mmHg. IAS/Shunts: No atrial level shunt detected by color flow Doppler.  LEFT VENTRICLE PLAX 2D LVIDd:         4.80 cm LVIDs:         3.90 cm LV PW:         0.90 cm LV IVS:        0.80 cm LVOT diam:     2.30 cm   3D Volume EF: LV SV:  52        3D EF:        43 % LV SV Index:   34        LV EDV:       130 ml LVOT Area:     4.15 cm  LV ESV:       74 ml                          LV SV:        56 ml RIGHT VENTRICLE          IVC RV Basal diam:  3.20 cm  IVC diam: 1.00 cm LEFT ATRIUM             Index        RIGHT ATRIUM           Index LA diam:        4.00 cm 2.62 cm/m   RA Area:     15.90 cm LA Vol (A2C):   61.5 ml 40.29 ml/m  RA Volume:   46.90 ml  30.73 ml/m LA Vol (A4C):   58.1 ml 38.06 ml/m LA Biplane Vol: 60.9 ml 39.90 ml/m  AORTIC VALVE                    PULMONIC VALVE AV Area (Vmax):    2.80 cm     PV Vmax:          0.56 m/s AV Area (Vmean):   2.58 cm     PV Peak grad:     1.3 mmHg AV Area (VTI):     2.35 cm     PR End Diast Vel: 8.88 msec AV Vmax:           100.00 cm/s AV Vmean:          65.700 cm/s AV VTI:            0.223 m AV Peak Grad:      4.0 mmHg AV Mean Grad:      2.0 mmHg LVOT Vmax:         67.30 cm/s LVOT Vmean:        40.800 cm/s LVOT VTI:          0.126 m LVOT/AV VTI ratio: 0.57 AI PHT:            746 msec AR Vena Contracta: 0.60 cm  AORTA Ao Root diam: 3.25 cm Ao Arch diam: 2.7 cm MITRAL VALVE               TRICUSPID VALVE MV Area (PHT): 1.86 cm    TR Peak grad:   32.0 mmHg MV Decel Time: 408 msec    TR Vmax:        283.00 cm/s MV E velocity: 73.90 cm/s MV A velocity: 66.90 cm/s  SHUNTS MV E/A ratio:  1.10        Systemic VTI:  0.13 m                            Systemic Diam: 2.30 cm Carlyle Dolly MD Electronically signed by Carlyle Dolly MD Signature Date/Time: 05/18/2022/3:41:13 PM    Final    CT Angio Chest PE W and/or Wo Contrast  Result Date: 05/17/2022 CLINICAL DATA:  Fatigue with cough.  PE suspected. EXAM: CT ANGIOGRAPHY CHEST  WITH CONTRAST TECHNIQUE: Multidetector CT imaging of  the chest was performed using the standard protocol during bolus administration of intravenous contrast. Multiplanar CT image reconstructions and MIPs were obtained to evaluate the vascular anatomy. RADIATION DOSE REDUCTION: This exam was performed according to the departmental dose-optimization program which includes automated exposure control, adjustment of the mA and/or kV according to patient size and/or use of iterative reconstruction technique. CONTRAST:  43mL OMNIPAQUE IOHEXOL 350 MG/ML SOLN COMPARISON:  Chest CT 12/11/2021 FINDINGS: Cardiovascular: Heart is enlarged. No substantial pericardial effusion. Coronary artery calcification is evident. Mild atherosclerotic calcification is noted in the wall of the thoracic aorta. Ascending thoracic aorta measures 3.9 cm diameter. There is no filling defect within the opacified pulmonary arteries to suggest the presence of an acute pulmonary embolus. Mediastinum/Nodes: Mild mediastinal lymphadenopathy evident with 10 mm short axis precarinal node on 36/5 and 13 mm short axis subcarinal node on 60/5. There is no hilar lymphadenopathy. The esophagus has normal imaging features. There is no axillary lymphadenopathy. Lungs/Pleura: 16 mm perifissural left lower lobe pulmonary nodule on image 61/6 is new in the interval. Tiny peripheral clustered nodularity in the superior segment left lower lobe may be postobstructive or related to infectious/inflammatory etiology. Patchy ground-glass attenuation noted in both lungs with scattered additional tiny bilateral pulmonary nodules. Small right and tiny left pleural effusions evident. Upper Abdomen: Probable exophytic cyst upper pole right kidney is incompletely visualized but similar to study from 04/17/2021. Musculoskeletal: No worrisome lytic or sclerotic osseous abnormality. Review of the MIP images confirms the above findings. IMPRESSION: 1. No CT evidence for acute  pulmonary embolus. 2. 16 mm perifissural left lower lobe pulmonary nodule, new in the interval. Neoplasm a concern. Close follow-up recommended. PET-CT may be warranted. 3. Tiny peripheral clustered nodularity in the superior segment left lower lobe may be postobstructive or related to infectious/inflammatory etiology. Attention on follow-up recommended. 4. Patchy ground-glass attenuation in both lungs with scattered additional tiny bilateral pulmonary nodules. Imaging features may be infectious/inflammatory. 5. Small right and tiny left pleural effusions. 6.  Aortic Atherosclerosis (ICD10-I70.0). Electronically Signed   By: Misty Stanley M.D.   On: 05/17/2022 12:17   DG Chest 2 View  Result Date: 05/17/2022 CLINICAL DATA:  Shortness of breath. EXAM: CHEST - 2 VIEW COMPARISON:  05/05/2022 FINDINGS: Cardiomegaly and hyperinflation again noted. A small RIGHT pleural effusion is present. There is no evidence of airspace disease, pneumothorax or acute bony abnormality. IMPRESSION: 1. Cardiomegaly with small RIGHT pleural effusion. 2. Hyperinflation. Electronically Signed   By: Margarette Canada M.D.   On: 05/17/2022 10:47   DG Chest Port 1 View  Result Date: 05/17/2022 CLINICAL DATA:  Shortness of breath EXAM: PORTABLE CHEST 1 VIEW COMPARISON:  05/15/2022 and prior radiographs FINDINGS: Cardiomegaly noted with pulmonary vascular congestion. A trace RIGHT pleural effusion and equivocal trace LEFT pleural effusion noted. Mild bibasilar opacities are present. There is no evidence of pneumothorax or acute bony abnormality. IMPRESSION: 1. Cardiomegaly with pulmonary vascular congestion and trace RIGHT and equivocal trace LEFT pleural effusions. 2. Mild bibasilar opacities which may represent atelectasis or airspace disease/pneumonia. Electronically Signed   By: Margarette Canada M.D.   On: 05/17/2022 10:45   DG Chest 2 View  Result Date: 05/06/2022 CLINICAL DATA:  Cough.  Shortness of breath.  COPD. EXAM: CHEST - 2 VIEW  COMPARISON:  CT chest 12/11/2021.  Chest x-ray 02/19/2021. FINDINGS: Mediastinum hilar structures are stable. Stable calcified left mediastinal lymph nodes and left upper lobe calcified nodule consistent granulomas disease again noted. Stable interstitial nodular prominence most  likely chronic. Persistent right base infiltrate versus scarring. Persistent small right pleural effusion versus pleural scarring. Cardiomegaly. No pulmonary venous congestion. Degenerative changes scoliosis thoracic spine. IMPRESSION: 1. Stable changes of prior granulomas disease. Stable interstitial nodular prominence most likely chronic. Persistent right base infiltrate versus scarring. Persistent small right pleural effusion versus pleural scarring. 2. Cardiomegaly. No pulmonary venous congestion. Electronically Signed   By: Marcello Moores  Register M.D.   On: 05/06/2022 09:54    Microbiology: Recent Results (from the past 240 hour(s))  Resp panel by RT-PCR (RSV, Flu A&B, Covid) Anterior Nasal Swab     Status: None   Collection Time: 05/17/22  9:38 AM   Specimen: Anterior Nasal Swab  Result Value Ref Range Status   SARS Coronavirus 2 by RT PCR NEGATIVE NEGATIVE Final   Influenza A by PCR NEGATIVE NEGATIVE Final   Influenza B by PCR NEGATIVE NEGATIVE Final    Comment: (NOTE) The Xpert Xpress SARS-CoV-2/FLU/RSV plus assay is intended as an aid in the diagnosis of influenza from Nasopharyngeal swab specimens and should not be used as a sole basis for treatment. Nasal washings and aspirates are unacceptable for Xpert Xpress SARS-CoV-2/FLU/RSV testing.  Fact Sheet for Patients: EntrepreneurPulse.com.au  Fact Sheet for Healthcare Providers: IncredibleEmployment.be  This test is not yet approved or cleared by the [place] FDA and has been authorized for detection and/or diagnosis of SARS-CoV-2 by FDA under an Emergency Use Authorization (EUA). This EUA will remain in effect (meaning  this test can be used) for the duration of the COVID-19 declaration under Section 564(b)(1) of the Act, 21 U.S.C. section 360bbb-3(b)(1), unless the authorization is terminated or revoked.     Resp Syncytial Virus by PCR NEGATIVE NEGATIVE Final    Comment: (NOTE) Fact Sheet for Patients: EntrepreneurPulse.com.au  Fact Sheet for Healthcare Providers: IncredibleEmployment.be  This test is not yet approved or cleared by the [place] FDA and has been authorized for detection and/or diagnosis of SARS-CoV-2 by FDA under an Emergency Use Authorization (EUA). This EUA will remain in effect (meaning this test can be used) for the duration of the COVID-19 declaration under Section 564(b)(1) of the Act, 21 U.S.C. section 360bbb-3(b)(1), unless the authorization is terminated or revoked.  Performed at [facility], [place] [address], [place], [place] [address]   Culture, blood (routine x 2)     Status: None   Collection Time: 05/17/22 10:05 AM   Specimen: BLOOD  Result Value Ref Range Status   Specimen Description BLOOD LEFT ANTECUBITAL  Final   Special Requests   Final    BOTTLES DRAWN AEROBIC AND ANAEROBIC Blood Culture adequate volume   Culture   Final    NO GROWTH 5 DAYS Performed at [facility], [place] [address], [place], [place] [address]    Report Status 05/22/2022 FINAL  Final  Culture, blood (routine x 2)     Status: None   Collection Time: 05/17/22 10:12 AM   Specimen: BLOOD RIGHT ARM  Result Value Ref Range Status   Specimen Description BLOOD RIGHT ARM  Final   Special Requests   Final    BOTTLES DRAWN AEROBIC AND ANAEROBIC Blood Culture adequate volume   Culture   Final    NO GROWTH 5 DAYS Performed at [facility], [place] [address] [place], [place] [address]    Report Status 05/22/2022 FINAL  Final     Labs: Basic Metabolic Panel: Recent Labs  Lab 05/19/22 0037 05/20/22 0042 05/22/22 1338 05/23/22 1329  05/24/22 0048 05/25/22 0013  NA 136 136 137 137 139 138  K 3.9 3.6 4.1 4.3 3.2* 4.9  CL 100 100 95* 102 106 107  CO2 24 25 21 25 24 22   GLUCOSE 90 97 109* 110* 104* 103*  BUN 17 18 36 35* 30* 22  CREATININE 1.24* 1.42* 3.30* 2.44* 1.73* 1.29*  CALCIUM 8.7* 8.6* 9.7 9.1 8.6* 9.1  MG 1.9  --   --   --  1.8  --    Liver Function Tests: Recent Labs  Lab 05/23/22 1329  AST 31  ALT 19  ALKPHOS 75  BILITOT 1.2  PROT 6.2*  ALBUMIN 2.9*   No results for input(s): "LIPASE", "AMYLASE" in the last 168 hours. No results for input(s): "AMMONIA" in the last 168 hours. CBC: Recent Labs  Lab 05/23/22 1329  WBC 6.1  NEUTROABS 4.1  HGB 12.7  HCT 38.2  MCV 95.0  PLT 291   Cardiac Enzymes: No results for input(s): "CKTOTAL", "CKMB", "CKMBINDEX", "TROPONINI" in the last 168 hours. BNP: BNP (last 3 results) Recent Labs    05/15/22 1550 05/17/22 0954 05/23/22 2250  BNP 1,569.1* 1,639.0* 412.8*    ProBNP (last 3 results) No results for input(s): "PROBNP" in the last 8760 hours.  CBG: No results for input(s): "GLUCAP" in the last 168 hours.     Signed:  Domenic Polite MD.  [facility] 05/25/2022, 10:48 AM

## 2022-05-25 NOTE — TOC Transition Note (Addendum)
Transition of Care [facility]) - CM/SW Discharge Note   Patient Details  Name: [name] MRN: [id number] Date of Birth: 05-20-1928  [facility] CM/SW Contact:  Zenon Mayo, RN Phone Number: 05/25/2022, 9:51 AM   Clinical Narrative:    Patient is for dc today, NCM notified Tommi Rumps with [facility]. Asked MD for [facility] orders.         Patient Goals and CMS Choice      Discharge Placement                         Discharge Plan and Services Additional resources added to the After Visit Summary for                                       Social Determinants of Health (SDOH) Interventions SDOH Screenings   Food Insecurity: No Food Insecurity (05/23/2022)  Housing: Low Risk  (05/23/2022)  Transportation Needs: No Transportation Needs (05/23/2022)  Utilities: Not At Risk (05/23/2022)  Alcohol Screen: Low Risk  (05/12/2022)  Depression (PHQ2-9): Low Risk  (05/15/2022)  Financial Resource Strain: Low Risk  (05/21/2022)  Physical Activity: Insufficiently Active (05/12/2022)  Social Connections: Moderately Integrated (05/12/2022)  Stress: No Stress Concern Present (05/12/2022)  Tobacco Use: Low Risk  (05/23/2022)     Readmission Risk Interventions     No data to display

## 2022-05-25 NOTE — TOC Transition Note (Addendum)
Transition of Care [facility]) - CM/SW Discharge Note   Patient Details  Name: [name] MRN: [id number] Date of Birth: 07-29-1928  [facility]) CM/SW Contact:  Zenon Mayo, RN Phone Number: 05/25/2022, 12:03 PM   Clinical Narrative:    Patient is for dc today, NCM notified [facility] of dc. .         Patient Goals and CMS Choice      Discharge Placement                         Discharge Plan and Services Additional resources added to the After Visit Summary for                                       Social Determinants of Health (SDOH) Interventions SDOH Screenings   Food Insecurity: No Food Insecurity (05/23/2022)  Housing: Low Risk  (05/23/2022)  Transportation Needs: No Transportation Needs (05/23/2022)  Utilities: Not At Risk (05/23/2022)  Alcohol Screen: Low Risk  (05/12/2022)  Depression (PHQ2-9): Low Risk  (05/15/2022)  Financial Resource Strain: Low Risk  (05/21/2022)  Physical Activity: Insufficiently Active (05/12/2022)  Social Connections: Moderately Integrated (05/12/2022)  Stress: No Stress Concern Present (05/12/2022)  Tobacco Use: Low Risk  (05/23/2022)     Readmission Risk Interventions     No data to display

## 2022-05-26 ENCOUNTER — Ambulatory Visit: Payer: Self-pay | Admitting: *Deleted

## 2022-05-26 NOTE — Progress Notes (Signed)
Patient ID: [name], female   DOB: [birth year], 89 y.o.   MRN: [id number]      89 y.o. f/u post MV repair TTE 01/12/19 showed trace MR with low diastolic gradients and normal EF 60%.  She has HTN on ARB More dizziness on 50 mg Cozaar dose decreased. She thinks statin Makes her feel worse and now taking Qod.  Some LE varicosities   Depression continues. Husband [name] was a patient of mine and died of lung cancer 6 years ago They were married 64 years. Shortly after that [name] died tragically in there back yard of a chain saw accident. Son [name] living with her and two daughters near by.   Has had some memory issues and has been on namenda and aricept  Hospitalized 09/27/20 with COVID causing afib and new DCM with TTE showing EF 30-35% MV repair fine with trivial MR no MS mean gradient 4 mmhg  Converted with amiodarone to slow sinus and aricept d/c Added Toprol, ARB and eliquis 2.5 bid  TTE 01/10/21 EF improved 35-40%   December 2022 more dyspnea Primary ? Sinusitis and started her on Augmentin Cough with sputum rhiorrhea and sore throat with cough and confusion from Namenda/Aricept   Called office again 11/25/20 complaining of dyspnea and more rapid HR;s  She had CT 04/17/21 showing bronchiectasis with mucous plugging in RML and multifocal infiltrates Seen by pulmonary Dr Elsworth Soho 05/15/21 thought to be improving but appears to have bronchiectasis and MAI  11/28/21:  in rapid fib/flutter She clearly gets dyspnea and fatigue with minimum walking She was started on amiodarone and converted when seen in f/u at Afib clinic on 01/08/22   05/17/22-05/20/22 Admitted with CHF and afib diuresed and rate control with iv digoxin and lopressor changed to coreg Sent home on lasix 20 mg daily and amiodarone 200 mg Readmitted 3/17 with azotemia and Cr 3.3 Benicar d/c and sent home on lasix 20 mg daily again   Doing better at dry weight and in NSR   BP 120/62   Pulse 76   Ht 5\' 3"  (1.6 m)   Wt 100 lb 9.6 oz  (45.6 kg)   SpO2 93%   BMI 17.82 kg/m   Affect appropriate Healthy:  appears stated age HEENT: normal Neck supple with no adenopathy JVP normal no bruits no thyromegaly Lungs clear with no wheezing and good diaphragmatic motion Heart:  S1/S2 no murmur, no rub, gallop or click PMI normal  Post sternotomy  Abdomen: benighn, BS positve, no tenderness, no AAA no bruit.  No HSM or HJR Distal pulses intact with no bruits No edema Neuro non-focal Skin warm and dry No muscular weakness   Current Outpatient Medications  Medication Sig Dispense Refill   acetaminophen (TYLENOL) 325 MG tablet Take 325 mg by mouth as needed for moderate pain.     amiodarone (PACERONE) 200 MG tablet Take 0.5 tablets (100 mg total) by mouth daily. Take one tablet by mouth daily     apixaban (ELIQUIS) 2.5 MG TABS tablet Take 1 tablet (2.5 mg total) by mouth 2 (two) times daily. 180 tablet 1   atorvastatin (LIPITOR) 10 MG tablet Take 1 tablet (10 mg total) by mouth daily. 90 tablet 3   Biotin 1000 MCG tablet Take 1,000 mcg by mouth daily.     cholecalciferol 25 MCG (1000 UT) tablet Take 1,000 Units by mouth daily.     furosemide (LASIX) 20 MG tablet Take 1 tablet (20 mg total) by mouth daily. Take  2 tablets in case of weight gain 2 to 3 lbs in 24 hrs or 5 lbs in 7 days. (Patient taking differently: Take 20 mg by mouth daily.) 30 tablet 3   No current facility-administered medications for this visit.    Allergies  Donepezil, Actonel [risedronate sodium], Aspirin-caffeine, Codeine, Memantine, Penicillins, and Sulfonamide derivatives  Electrocardiogram:   12/02/17  SR rate 62 LAD RBBB 06/02/2022 SR rate 58 RBBB/LAD  06/02/2022 afib/flutter rate 122 RBBB LAD   Assessment and Plan  MV Repair:  SBE prophylaxis discussed  Echo 05/18/22 repair intact mild MR    HTN: Well controlled.  Continue current medications and low sodium Dash type diet.    Thyroid:  On replacement TSH 3.14 September 2019 normal   PAF: continue  low dose eliquis given age weight 45 kg and elevated recent Cr with azotemia Continue amiodarone Beta blocker d/c due to sinus pauses   DCM:  EF stable TTE 05/18/22 30-35% beta blocker d/c due to SSS and pauses ARB d/c due to azotemia and markedly increase Cr Continue current dose of lasix Check BNP/BMET BNP improved to 412 on recent d/c   Memory: Aricept d/c in hospital due to bradycardia Sent Dr Livia Snellen a message never to restart and she appears to be having side effects from Namenda also d/c   HLD:  continue liptor   BMET/BNP  F/U in 6 months   [name]

## 2022-05-26 NOTE — Patient Outreach (Signed)
Care Coordination   Initial Visit Note   11/30/2022 late entry for 05/26/22 Name: [name] MRN: [id number] DOB: [birth year]  [name] is a 89 y.o. year old female who sees Stacks, Broadus John, MD for primary care. I spoke with  [name] by phone today.  What matters to the patients health and wellness today?  Weak & sore from lying in hospital bed, sleep needed, weight at 98 lbs.  ED on 05/23/22, appetite improving ensure, cane, skin turgor  Denies shortness of breath,    Goals Addressed             This Visit's Progress    [facility] care cocrdination services       Interventions Today    Flowsheet Row Most Recent Value  Chronic Disease   Chronic disease during today's visit Other  [sleep, decrease in weight]  General Interventions   General Interventions Discussed/Reviewed General Interventions Discussed, Doctor Visits  Doctor Visits Discussed/Reviewed Doctor Visits Discussed, PCP, Specialist  PCP/Specialist Visits Compliance with follow-up visit  Exercise Interventions   Exercise Discussed/Reviewed Physical Activity  Physical Activity Discussed/Reviewed Physical Activity Discussed  Education Interventions   Education Provided Provided Education  Provided Verbal Education On Nutrition, Medication, Community Resources, Other  Mental Health Interventions   Mental Health Discussed/Reviewed Mental Health Discussed, Coping Strategies  Nutrition Interventions   Nutrition Discussed/Reviewed Nutrition Discussed, Fluid intake, Increasing proteins, Supplemental nutrition  Pharmacy Interventions   Pharmacy Dicussed/Reviewed Pharmacy Topics Discussed, Affording Medications  Safety Interventions   Safety Discussed/Reviewed Safety Discussed              SDOH assessments and interventions completed:  Yes  SDOH Interventions Today    Flowsheet Row Most Recent Value  SDOH Interventions   Food Insecurity Interventions Intervention Not Indicated  Transportation Interventions  Intervention Not Indicated  Utilities Interventions Intervention Not Indicated  Financial Strain Interventions Intervention Not Indicated  Social Connections Interventions Intervention Not Indicated        Care Coordination Interventions:  Yes, provided   Follow up plan: Follow up call scheduled for 06/13/22    Encounter Outcome:  Pt. Visit Completed   Craig Wisnewski L. Noelle Penner, RN, BSN, CCM [facility] Care Management Community Coordinator Office number [phone]

## 2022-05-27 ENCOUNTER — Telehealth: Payer: Self-pay | Admitting: *Deleted

## 2022-05-27 ENCOUNTER — Encounter: Payer: Self-pay | Admitting: *Deleted

## 2022-05-27 DIAGNOSIS — I130 Hypertensive heart and chronic kidney disease with heart failure and stage 1 through stage 4 chronic kidney disease, or unspecified chronic kidney disease: Secondary | ICD-10-CM | POA: Diagnosis not present

## 2022-05-27 DIAGNOSIS — I429 Cardiomyopathy, unspecified: Secondary | ICD-10-CM | POA: Diagnosis not present

## 2022-05-27 DIAGNOSIS — I480 Paroxysmal atrial fibrillation: Secondary | ICD-10-CM | POA: Diagnosis not present

## 2022-05-27 DIAGNOSIS — I452 Bifascicular block: Secondary | ICD-10-CM | POA: Diagnosis not present

## 2022-05-27 DIAGNOSIS — N1831 Chronic kidney disease, stage 3a: Secondary | ICD-10-CM | POA: Diagnosis not present

## 2022-05-27 DIAGNOSIS — I5023 Acute on chronic systolic (congestive) heart failure: Secondary | ICD-10-CM | POA: Diagnosis not present

## 2022-05-27 DIAGNOSIS — I2510 Atherosclerotic heart disease of native coronary artery without angina pectoris: Secondary | ICD-10-CM | POA: Diagnosis not present

## 2022-05-27 DIAGNOSIS — I252 Old myocardial infarction: Secondary | ICD-10-CM | POA: Diagnosis not present

## 2022-05-27 DIAGNOSIS — I272 Pulmonary hypertension, unspecified: Secondary | ICD-10-CM | POA: Diagnosis not present

## 2022-05-27 NOTE — Transitions of Care (Post Inpatient/ED Visit) (Signed)
05/27/2022  Name: [name] MRN: [id number] DOB: 01/22/1929  Today's TOC FU Call Status: Today's TOC FU Call Status:: Successful TOC FU Call Competed TOC FU Call Complete Date: 05/27/22  Transition Care Management Follow-up Telephone Call Date of Discharge: 05/25/22 Discharge Facility: [facility]) Type of Discharge: Inpatient Admission Primary Inpatient Discharge Diagnosis:: AKI in setting of CKD- worsening labs How have you been since you were released from the hospital?: Better (per daughter/ caregiver [name], verified on SW [facility] DPR, "She is till very weak, but is overall better-- her appetite is much better now that she is at home; we made all of the medication changes and the home health nurse is supposed to come today") Any questions or concerns?: No  Items Reviewed: Did you receive and understand the discharge instructions provided?: Yes (thoroughly reviewed with patient who verbalizes excellent understanding of same) Medications obtained and verified?: Yes (Medications Reviewed) (reviewed medication changes as per recent hospital discharge provider notes/ AVS; confirmed family made changes as instructed; daughter is currently at work and is unable to review in detail; family manages all aspects of medication administration) Any new allergies since your discharge?: No Dietary orders reviewed?: Yes Type of Diet Ordered:: regular Do you have support at home?: Yes People in Home: child(ren), adult Name of Support/Comfort Primary Source: family resides with patient; patient is essentially independent in self care; family assists as needed  Home Care and Equipment/Supplies: [place] Ordered?: Yes Name of [place]:: Alvis Lemmings Has Agency set up a time to come to your home?: Yes [place] Visit Date: 05/27/22 Any new equipment or medical supplies ordered?: No  Functional Questionnaire: Do you need assistance with bathing/showering or dressing?:  No (needs supervision/ occasional assistance) Do you need assistance with meal preparation?: Yes Do you need assistance with eating?: No Do you have difficulty maintaining continence: No (needs supervision/ occasional assistance) Do you need assistance with getting out of bed/getting out of a chair/moving?: No (needs supervision/ occasional assistance) Do you have difficulty managing or taking your medications?: Yes (family manages all aspects of medication adminstration; patient then takes independently once medications are prepared in pill box)  Follow up appointments reviewed: PCP Follow-up appointment confirmed?: Yes Date of PCP follow-up appointment?: 05/28/22 Follow-up Provider: PCP for hospital follow up [facility] Follow-up appointment confirmed?: Yes Date of Specialist follow-up appointment?: 06/02/22 Follow-Up Specialty Provider:: cardiology provider Do you need transportation to your follow-up appointment?: No Do you understand care options if your condition(s) worsen?: Yes-patient verbalized understanding  SDOH Interventions Today    Flowsheet Row Most Recent Value  SDOH Interventions   Food Insecurity Interventions Intervention Not Indicated  Transportation Interventions Intervention Not Indicated  [family provides transportation]      TOC Interventions Today    Flowsheet Row Most Recent Value  TOC Interventions   TOC Interventions Discussed/Reviewed TOC Interventions Discussed      Interventions Today    Flowsheet Row Most Recent Value  Chronic Disease   Chronic disease during today's visit Chronic Kidney Disease/End Stage Renal Disease (ESRD)  General Interventions   General Interventions Discussed/Reviewed Doctor Visits, General Interventions Discussed  [confirmed RN CM Care Coordinator currently active in patient's ongoing care]  Doctor Visits Discussed/Reviewed Doctor Visits Discussed, Specialist, PCP  PCP/Specialist Visits Compliance with  follow-up visit  Education Interventions   Provided Verbal Education On When to see the doctor  Nutrition Interventions   Nutrition Discussed/Reviewed Nutrition Discussed  Pharmacy Interventions   Pharmacy Dicussed/Reviewed Pharmacy  Topics Discussed      Oneta Rack, RN, BSN, CCRN Alumnus RN CM Care Coordination/ Transition of [place] Management [phone]: direct office

## 2022-05-28 ENCOUNTER — Ambulatory Visit (INDEPENDENT_AMBULATORY_CARE_PROVIDER_SITE_OTHER): Payer: Medicare HMO | Admitting: Family Medicine

## 2022-05-28 ENCOUNTER — Encounter: Payer: Self-pay | Admitting: Family Medicine

## 2022-05-28 VITALS — BP 138/73 | HR 81 | Temp 97.2°F | Ht 63.0 in | Wt 101.0 lb

## 2022-05-28 DIAGNOSIS — I10 Essential (primary) hypertension: Secondary | ICD-10-CM

## 2022-05-28 DIAGNOSIS — I110 Hypertensive heart disease with heart failure: Secondary | ICD-10-CM

## 2022-05-28 DIAGNOSIS — I5022 Chronic systolic (congestive) heart failure: Secondary | ICD-10-CM | POA: Diagnosis not present

## 2022-05-28 DIAGNOSIS — I480 Paroxysmal atrial fibrillation: Secondary | ICD-10-CM | POA: Diagnosis not present

## 2022-05-28 NOTE — Progress Notes (Signed)
Subjective:  Patient ID: [name], female    DOB: 22-Feb-1929  Age: 89 y.o. MRN: [id number]  CC: Hospitalization Follow-up   HPI [name] presents for hospital follow up. Hospitalized at [facility] from 3/15 to 3/17/ 24. Had a fib and CHF. Taken off of her benicar and beta blocker      05/28/2022    2:06 PM 05/15/2022    2:53 PM 05/12/2022   10:59 AM  Depression screen PHQ 2/9  Decreased Interest 0 0 0  Down, Depressed, Hopeless 0 0 0  PHQ - 2 Score 0 0 0    History [name] has a past medical history of Arthritis, Atrial fibrillation ([place]), COVID-19, CVA (cerebral vascular accident) ([place]), HTN (hypertension), MR (mitral regurgitation), NSTEMI (non-ST elevated myocardial infarction) ([place]), Orthostatic hypotension (11/06/2011), and Pericarditis.   [name] has a past surgical history that includes anterior repair with perigee graft (04/16/06); Anterior and posterior vaginal repair (10/15/04); sacral spinous ligament suspension of vagina; suprapubic cystectomy; Total abdominal hysterectomy w/ bilateral salpingoophorectomy; and Mitral valve replacement.   Her family history includes Colon cancer in her brother; Heart attack in her father; Heart disease in her brother, father, and mother; Lupus in her father.[name] reports that [name] has never smoked. [name] has never used smokeless tobacco. [name] reports that [name] does not drink alcohol and does not use drugs.    ROS Review of Systems  Constitutional:  Positive for fatigue.  HENT: Negative.    Eyes:  Negative for visual disturbance.  Respiratory:  Negative for shortness of breath.   Cardiovascular:  Negative for chest pain.  Gastrointestinal:  Negative for abdominal pain.  Musculoskeletal:  Negative for arthralgias.  Neurological:  Positive for weakness.    Objective:  BP 138/73   Pulse 81   Temp (!) 97.2 F (36.2 C)   Ht 5\' 3"  (1.6 m)   Wt 101 lb (45.8 kg)   SpO2 95%   BMI 17.89 kg/m   BP Readings from Last 3 Encounters:  05/28/22 138/73   05/25/22 (!) 142/55  05/20/22 (!) 140/63    Wt Readings from Last 3 Encounters:  05/28/22 101 lb (45.8 kg)  05/25/22 100 lb 3.2 oz (45.5 kg)  05/20/22 105 lb 9.6 oz (47.9 kg)     Physical Exam Constitutional:      General: [name] is not in acute distress.    Appearance: [name] is well-developed.  HENT:     Head: Normocephalic and atraumatic.  Eyes:     Conjunctiva/sclera: Conjunctivae normal.     Pupils: Pupils are equal, round, and reactive to light.  Neck:     Thyroid: No thyromegaly.  Cardiovascular:     Rate and Rhythm: Normal rate and regular rhythm.     Heart sounds: Normal heart sounds. No murmur heard. Pulmonary:     Effort: Pulmonary effort is normal. No respiratory distress.     Breath sounds: Normal breath sounds. No wheezing or rales.  Abdominal:     General: Bowel sounds are normal. There is no distension.     Palpations: Abdomen is soft.     Tenderness: There is no abdominal tenderness.  Musculoskeletal:        General: Normal range of motion.     Cervical back: Normal range of motion and neck supple.  Lymphadenopathy:     Cervical: No cervical adenopathy.  Skin:    General: Skin is warm and dry.  Neurological:     Mental Status: [name] is alert and oriented  to person, place, and time.  Psychiatric:        Behavior: Behavior normal.        Thought Content: Thought content normal.        Judgment: Judgment normal.       Assessment & Plan:   [name] was seen today for hospitalization follow-up.  Diagnoses and all orders for this visit:  Paroxysmal atrial fibrillation (HCC) -     BMP8+EGFR -     TSH  Chronic HFrEF (heart failure with reduced ejection fraction) (HCC) -     BMP8+EGFR  Essential hypertension -     BMP8+EGFR       I have discontinued Sharon M. Dupuis "[name]"'s cetirizine. I am also having her maintain her Biotin, atorvastatin, apixaban, cholecalciferol, furosemide, acetaminophen, and amiodarone.  Allergies as of 05/28/2022        Reactions   Donepezil Other (See Comments)   bradycardia   Actonel [risedronate Sodium] Nausea And Vomiting   Aspirin-caffeine Other (See Comments)   Upset stomach   Codeine Other (See Comments)   Upset stomach   Memantine Other (See Comments)   Unknown    Penicillins Other (See Comments)   Unknown    Sulfonamide Derivatives Other (See Comments)   Swollen tongue        Medication List        Accurate as of May 28, 2022  9:05 PM. If you have any questions, ask your nurse or doctor.          STOP taking these medications    cetirizine 5 MG tablet Commonly known as: ZYRTEC Stopped by: [name], MD       TAKE these medications    acetaminophen 325 MG tablet Commonly known as: TYLENOL Take 325 mg by mouth as needed for moderate pain.   amiodarone 200 MG tablet Commonly known as: PACERONE Take 0.5 tablets (100 mg total) by mouth daily. Take one tablet by mouth daily   apixaban 2.5 MG Tabs tablet Commonly known as: ELIQUIS Take 1 tablet (2.5 mg total) by mouth 2 (two) times daily.   atorvastatin 10 MG tablet Commonly known as: LIPITOR Take 1 tablet (10 mg total) by mouth daily.   Biotin 1000 MCG tablet Take 1,000 mcg by mouth daily.   cholecalciferol 25 MCG (1000 UT) tablet Generic drug: Cholecalciferol Take 1,000 Units by mouth daily.   furosemide 20 MG tablet Commonly known as: LASIX Take 1 tablet (20 mg total) by mouth daily. Take 2 tablets in case of weight gain 2 to 3 lbs in 24 hrs or 5 lbs in 7 days. What changed: additional instructions         Follow-up: Return in about 1 month (around 06/28/2022).  [name], M.D.

## 2022-05-29 DIAGNOSIS — I5023 Acute on chronic systolic (congestive) heart failure: Secondary | ICD-10-CM | POA: Diagnosis not present

## 2022-05-29 DIAGNOSIS — I480 Paroxysmal atrial fibrillation: Secondary | ICD-10-CM | POA: Diagnosis not present

## 2022-05-29 DIAGNOSIS — I130 Hypertensive heart and chronic kidney disease with heart failure and stage 1 through stage 4 chronic kidney disease, or unspecified chronic kidney disease: Secondary | ICD-10-CM | POA: Diagnosis not present

## 2022-05-29 DIAGNOSIS — N1831 Chronic kidney disease, stage 3a: Secondary | ICD-10-CM | POA: Diagnosis not present

## 2022-05-29 DIAGNOSIS — I2510 Atherosclerotic heart disease of native coronary artery without angina pectoris: Secondary | ICD-10-CM | POA: Diagnosis not present

## 2022-05-29 DIAGNOSIS — I429 Cardiomyopathy, unspecified: Secondary | ICD-10-CM | POA: Diagnosis not present

## 2022-05-29 DIAGNOSIS — I272 Pulmonary hypertension, unspecified: Secondary | ICD-10-CM | POA: Diagnosis not present

## 2022-05-29 DIAGNOSIS — I452 Bifascicular block: Secondary | ICD-10-CM | POA: Diagnosis not present

## 2022-05-29 DIAGNOSIS — I252 Old myocardial infarction: Secondary | ICD-10-CM | POA: Diagnosis not present

## 2022-05-29 LAB — BMP8+EGFR
BUN/Creatinine Ratio: 16 (ref 12–28)
BUN: 23 mg/dL (ref 10–36)
CO2: 22 mmol/L (ref 20–29)
Calcium: 9.6 mg/dL (ref 8.7–10.3)
Chloride: 101 mmol/L (ref 96–106)
Creatinine, Ser: 1.41 mg/dL — ABNORMAL HIGH (ref 0.57–1.00)
Glucose: 98 mg/dL (ref 70–99)
Potassium: 4.2 mmol/L (ref 3.5–5.2)
Sodium: 142 mmol/L (ref 134–144)
eGFR: 35 mL/min/{1.73_m2} — ABNORMAL LOW (ref >59)

## 2022-05-29 LAB — TSH: TSH: 3.23 u[IU]/mL (ref 0.450–4.500)

## 2022-05-29 NOTE — Progress Notes (Signed)
Hello Carletha,  Your lab result is normal and/or stable.Some minor variations that are not significant are commonly marked abnormal, but do not represent any medical problem for you.  Best regards, Taeko Schaffer, M.D.

## 2022-06-02 ENCOUNTER — Ambulatory Visit: Payer: Medicare HMO | Attending: Cardiovascular Disease | Admitting: Cardiovascular Disease

## 2022-06-02 ENCOUNTER — Encounter: Payer: Self-pay | Admitting: Cardiovascular Disease

## 2022-06-02 VITALS — BP 120/62 | HR 76 | Ht 63.0 in | Wt 100.6 lb

## 2022-06-02 DIAGNOSIS — Z9889 Other specified postprocedural states: Secondary | ICD-10-CM

## 2022-06-02 DIAGNOSIS — I480 Paroxysmal atrial fibrillation: Secondary | ICD-10-CM | POA: Diagnosis not present

## 2022-06-02 DIAGNOSIS — R0609 Other forms of dyspnea: Secondary | ICD-10-CM

## 2022-06-02 NOTE — Patient Instructions (Signed)
Medication Instructions:  Your physician recommends that you continue on your current medications as directed. Please refer to the Current Medication list given to you today.  *If you need a refill on your cardiac medications before your next appointment, please call your pharmacy*  Lab Work: Your physician recommends that you have lab work today- BMET and BNP  If you have labs (blood work) drawn today and your tests are completely normal, you will receive your results only by: MyChart Message (if you have [place]) OR A paper copy in the mail If you have any lab test that is abnormal or we need to change your treatment, we will call you to review the results.  Testing/Procedures: None ordered today.  Follow-Up: At [facility], you and your health needs are our priority.  As part of our continuing mission to provide you with exceptional heart care, we have created designated Provider Care Teams.  These Care Teams include your primary Cardiologist (physician) and Advanced Practice Providers (APPs -  Physician Assistants and Nurse Practitioners) who all work together to provide you with the care you need, when you need it.  We recommend signing up for the patient portal called "MyChart".  Sign up information is provided on this After Visit Summary.  MyChart is used to connect with patients for Virtual Visits (Telemedicine).  Patients are able to view lab/test results, encounter notes, upcoming appointments, etc.  Non-urgent messages can be sent to your provider as well.   To learn more about what you can do with MyChart, go to [email or web address].    Your next appointment:   3 month(s)  Provider:   [name], MD

## 2022-06-03 LAB — BASIC METABOLIC PANEL
BUN/Creatinine Ratio: 19 (ref 12–28)
BUN: 24 mg/dL (ref 10–36)
CO2: 26 mmol/L (ref 20–29)
Calcium: 9.4 mg/dL (ref 8.7–10.3)
Chloride: 99 mmol/L (ref 96–106)
Creatinine, Ser: 1.26 mg/dL — ABNORMAL HIGH (ref 0.57–1.00)
Glucose: 88 mg/dL (ref 70–99)
Potassium: 3.7 mmol/L (ref 3.5–5.2)
Sodium: 141 mmol/L (ref 134–144)
eGFR: 40 mL/min/{1.73_m2} — ABNORMAL LOW (ref >59)

## 2022-06-03 LAB — PRO B NATRIURETIC PEPTIDE: NT-Pro BNP: 3366 pg/mL — ABNORMAL HIGH (ref 0–738)

## 2022-06-04 ENCOUNTER — Telehealth: Payer: Self-pay

## 2022-06-04 ENCOUNTER — Other Ambulatory Visit: Payer: Self-pay | Admitting: Cardiovascular Disease

## 2022-06-04 DIAGNOSIS — I130 Hypertensive heart and chronic kidney disease with heart failure and stage 1 through stage 4 chronic kidney disease, or unspecified chronic kidney disease: Secondary | ICD-10-CM | POA: Diagnosis not present

## 2022-06-04 DIAGNOSIS — I452 Bifascicular block: Secondary | ICD-10-CM | POA: Diagnosis not present

## 2022-06-04 DIAGNOSIS — Z79899 Other long term (current) drug therapy: Secondary | ICD-10-CM

## 2022-06-04 DIAGNOSIS — I252 Old myocardial infarction: Secondary | ICD-10-CM | POA: Diagnosis not present

## 2022-06-04 DIAGNOSIS — I272 Pulmonary hypertension, unspecified: Secondary | ICD-10-CM | POA: Diagnosis not present

## 2022-06-04 DIAGNOSIS — I480 Paroxysmal atrial fibrillation: Secondary | ICD-10-CM | POA: Diagnosis not present

## 2022-06-04 DIAGNOSIS — R0609 Other forms of dyspnea: Secondary | ICD-10-CM

## 2022-06-04 DIAGNOSIS — E876 Hypokalemia: Secondary | ICD-10-CM

## 2022-06-04 DIAGNOSIS — I5023 Acute on chronic systolic (congestive) heart failure: Secondary | ICD-10-CM | POA: Diagnosis not present

## 2022-06-04 DIAGNOSIS — R7989 Other specified abnormal findings of blood chemistry: Secondary | ICD-10-CM

## 2022-06-04 DIAGNOSIS — I2510 Atherosclerotic heart disease of native coronary artery without angina pectoris: Secondary | ICD-10-CM | POA: Diagnosis not present

## 2022-06-04 DIAGNOSIS — Z9889 Other specified postprocedural states: Secondary | ICD-10-CM

## 2022-06-04 DIAGNOSIS — N1831 Chronic kidney disease, stage 3a: Secondary | ICD-10-CM | POA: Diagnosis not present

## 2022-06-04 DIAGNOSIS — I429 Cardiomyopathy, unspecified: Secondary | ICD-10-CM | POA: Diagnosis not present

## 2022-06-04 MED ORDER — EPLERENONE 25 MG PO TABS: 25.0000 mg | ORAL_TABLET | Freq: Every day | ORAL | 3 refills | Status: DC

## 2022-06-04 NOTE — Telephone Encounter (Signed)
The patient's daughter has been notified of the result and verbalized understanding.  All questions (if any) were answered. Ewell Poe Herron, RN 06/04/2022 5:13 PM    Placed orders for lab work and sent medication to patient's pharmacy of choice.

## 2022-06-04 NOTE — Telephone Encounter (Signed)
-----   Message from Josue Hector, MD sent at 06/03/2022  8:20 AM EDT ----- BNP up kidneys ok K little low She did not tolerate aldactone Have her start Inspra 50 mg daily repeat BMET and BNP in 3 weeks

## 2022-06-05 DIAGNOSIS — I480 Paroxysmal atrial fibrillation: Secondary | ICD-10-CM | POA: Diagnosis not present

## 2022-06-05 DIAGNOSIS — I452 Bifascicular block: Secondary | ICD-10-CM | POA: Diagnosis not present

## 2022-06-05 DIAGNOSIS — I272 Pulmonary hypertension, unspecified: Secondary | ICD-10-CM | POA: Diagnosis not present

## 2022-06-05 DIAGNOSIS — I252 Old myocardial infarction: Secondary | ICD-10-CM | POA: Diagnosis not present

## 2022-06-05 DIAGNOSIS — N1831 Chronic kidney disease, stage 3a: Secondary | ICD-10-CM | POA: Diagnosis not present

## 2022-06-05 DIAGNOSIS — I5023 Acute on chronic systolic (congestive) heart failure: Secondary | ICD-10-CM | POA: Diagnosis not present

## 2022-06-05 DIAGNOSIS — I429 Cardiomyopathy, unspecified: Secondary | ICD-10-CM | POA: Diagnosis not present

## 2022-06-05 DIAGNOSIS — I130 Hypertensive heart and chronic kidney disease with heart failure and stage 1 through stage 4 chronic kidney disease, or unspecified chronic kidney disease: Secondary | ICD-10-CM | POA: Diagnosis not present

## 2022-06-05 DIAGNOSIS — I2510 Atherosclerotic heart disease of native coronary artery without angina pectoris: Secondary | ICD-10-CM | POA: Diagnosis not present

## 2022-06-10 DIAGNOSIS — N1831 Chronic kidney disease, stage 3a: Secondary | ICD-10-CM | POA: Diagnosis not present

## 2022-06-10 DIAGNOSIS — I252 Old myocardial infarction: Secondary | ICD-10-CM | POA: Diagnosis not present

## 2022-06-10 DIAGNOSIS — I480 Paroxysmal atrial fibrillation: Secondary | ICD-10-CM | POA: Diagnosis not present

## 2022-06-10 DIAGNOSIS — I5023 Acute on chronic systolic (congestive) heart failure: Secondary | ICD-10-CM | POA: Diagnosis not present

## 2022-06-10 DIAGNOSIS — I272 Pulmonary hypertension, unspecified: Secondary | ICD-10-CM | POA: Diagnosis not present

## 2022-06-10 DIAGNOSIS — I2510 Atherosclerotic heart disease of native coronary artery without angina pectoris: Secondary | ICD-10-CM | POA: Diagnosis not present

## 2022-06-10 DIAGNOSIS — I429 Cardiomyopathy, unspecified: Secondary | ICD-10-CM | POA: Diagnosis not present

## 2022-06-10 DIAGNOSIS — I452 Bifascicular block: Secondary | ICD-10-CM | POA: Diagnosis not present

## 2022-06-10 DIAGNOSIS — I130 Hypertensive heart and chronic kidney disease with heart failure and stage 1 through stage 4 chronic kidney disease, or unspecified chronic kidney disease: Secondary | ICD-10-CM | POA: Diagnosis not present

## 2022-06-11 ENCOUNTER — Ambulatory Visit (INDEPENDENT_AMBULATORY_CARE_PROVIDER_SITE_OTHER): Payer: Medicare HMO

## 2022-06-11 DIAGNOSIS — I480 Paroxysmal atrial fibrillation: Secondary | ICD-10-CM | POA: Diagnosis not present

## 2022-06-11 DIAGNOSIS — I252 Old myocardial infarction: Secondary | ICD-10-CM | POA: Diagnosis not present

## 2022-06-11 DIAGNOSIS — I5023 Acute on chronic systolic (congestive) heart failure: Secondary | ICD-10-CM

## 2022-06-11 DIAGNOSIS — I7121 Aneurysm of the ascending aorta, without rupture: Secondary | ICD-10-CM

## 2022-06-11 DIAGNOSIS — I130 Hypertensive heart and chronic kidney disease with heart failure and stage 1 through stage 4 chronic kidney disease, or unspecified chronic kidney disease: Secondary | ICD-10-CM | POA: Diagnosis not present

## 2022-06-11 DIAGNOSIS — I452 Bifascicular block: Secondary | ICD-10-CM | POA: Diagnosis not present

## 2022-06-11 DIAGNOSIS — I2510 Atherosclerotic heart disease of native coronary artery without angina pectoris: Secondary | ICD-10-CM

## 2022-06-11 DIAGNOSIS — I429 Cardiomyopathy, unspecified: Secondary | ICD-10-CM | POA: Diagnosis not present

## 2022-06-11 DIAGNOSIS — N1831 Chronic kidney disease, stage 3a: Secondary | ICD-10-CM

## 2022-06-11 DIAGNOSIS — I951 Orthostatic hypotension: Secondary | ICD-10-CM

## 2022-06-11 DIAGNOSIS — I272 Pulmonary hypertension, unspecified: Secondary | ICD-10-CM

## 2022-06-11 DIAGNOSIS — I700 Atherosclerosis of aorta: Secondary | ICD-10-CM

## 2022-06-12 ENCOUNTER — Encounter: Payer: Medicare HMO | Admitting: *Deleted

## 2022-06-13 ENCOUNTER — Ambulatory Visit: Payer: Self-pay | Admitting: *Deleted

## 2022-06-13 NOTE — Patient Outreach (Signed)
  Care Coordination   06/13/2022 Name: [name] MRN: [id number] DOB: [birth year]   Care Coordination Outreach Attempts:  An unsuccessful telephone outreach was attempted today to offer the patient information about available care coordination services as a benefit of their health plan.   Follow Up Plan:  Additional outreach attempts will be made to offer the patient care coordination information and services.   Encounter Outcome:  No Answer   Care Coordination Interventions:  No, not indicated    Jasslyn Finkel L. Noelle Penner, RN, BSN, CCM [facility] Care Management Community Coordinator Office number [phone]

## 2022-06-17 DIAGNOSIS — I252 Old myocardial infarction: Secondary | ICD-10-CM | POA: Diagnosis not present

## 2022-06-17 DIAGNOSIS — I452 Bifascicular block: Secondary | ICD-10-CM | POA: Diagnosis not present

## 2022-06-17 DIAGNOSIS — I2510 Atherosclerotic heart disease of native coronary artery without angina pectoris: Secondary | ICD-10-CM | POA: Diagnosis not present

## 2022-06-17 DIAGNOSIS — I5023 Acute on chronic systolic (congestive) heart failure: Secondary | ICD-10-CM | POA: Diagnosis not present

## 2022-06-17 DIAGNOSIS — I480 Paroxysmal atrial fibrillation: Secondary | ICD-10-CM | POA: Diagnosis not present

## 2022-06-17 DIAGNOSIS — I272 Pulmonary hypertension, unspecified: Secondary | ICD-10-CM | POA: Diagnosis not present

## 2022-06-17 DIAGNOSIS — N1831 Chronic kidney disease, stage 3a: Secondary | ICD-10-CM | POA: Diagnosis not present

## 2022-06-17 DIAGNOSIS — I429 Cardiomyopathy, unspecified: Secondary | ICD-10-CM | POA: Diagnosis not present

## 2022-06-17 DIAGNOSIS — I130 Hypertensive heart and chronic kidney disease with heart failure and stage 1 through stage 4 chronic kidney disease, or unspecified chronic kidney disease: Secondary | ICD-10-CM | POA: Diagnosis not present

## 2022-06-20 DIAGNOSIS — I452 Bifascicular block: Secondary | ICD-10-CM | POA: Diagnosis not present

## 2022-06-20 DIAGNOSIS — I130 Hypertensive heart and chronic kidney disease with heart failure and stage 1 through stage 4 chronic kidney disease, or unspecified chronic kidney disease: Secondary | ICD-10-CM | POA: Diagnosis not present

## 2022-06-20 DIAGNOSIS — I429 Cardiomyopathy, unspecified: Secondary | ICD-10-CM | POA: Diagnosis not present

## 2022-06-20 DIAGNOSIS — I5023 Acute on chronic systolic (congestive) heart failure: Secondary | ICD-10-CM | POA: Diagnosis not present

## 2022-06-20 DIAGNOSIS — I252 Old myocardial infarction: Secondary | ICD-10-CM | POA: Diagnosis not present

## 2022-06-20 DIAGNOSIS — N1831 Chronic kidney disease, stage 3a: Secondary | ICD-10-CM | POA: Diagnosis not present

## 2022-06-20 DIAGNOSIS — I480 Paroxysmal atrial fibrillation: Secondary | ICD-10-CM | POA: Diagnosis not present

## 2022-06-20 DIAGNOSIS — I2510 Atherosclerotic heart disease of native coronary artery without angina pectoris: Secondary | ICD-10-CM | POA: Diagnosis not present

## 2022-06-20 DIAGNOSIS — I272 Pulmonary hypertension, unspecified: Secondary | ICD-10-CM | POA: Diagnosis not present

## 2022-06-23 ENCOUNTER — Ambulatory Visit: Payer: Self-pay | Admitting: *Deleted

## 2022-06-23 NOTE — Patient Instructions (Signed)
Visit Information  Thank you for taking time to visit with me today. Please don't hesitate to contact me if I can be of assistance to you.   Following are the goals we discussed today:   Goals Addressed             This Visit's Progress    [facility] care cocrdination services   On track    Interventions Today    Flowsheet Row Most Recent Value  Chronic Disease   Chronic disease during today's visit Other, Chronic Obstructive Pulmonary Disease (COPD)  [hypothyroidism, unsteady mobility, fatiue, weight management]  General Interventions   General Interventions Discussed/Reviewed General Interventions Discussed, Labs, Durable Medical Equipment (DME), Doctor Visits  Labs Kidney Function  [TSH]  Doctor Visits Discussed/Reviewed Doctor Visits Reviewed, PCP, Specialist  Durable Medical Equipment (DME) --  [cane]  PCP/Specialist Visits Compliance with follow-up visit  Education Interventions   Provided Verbal Education On Nutrition  Nutrition Interventions   Nutrition Discussed/Reviewed Nutrition Reviewed, Increaing proteins  Pharmacy Interventions   Pharmacy Dicussed/Reviewed Pharmacy Topics Reviewed, Medications and their functions  Safety Interventions   Safety Discussed/Reviewed Safety Reviewed, Fall Risk  Advanced Directive Interventions   Advanced Directives Discussed/Reviewed Advanced Directives Discussed              Our next appointment is by telephone on 08/25/22 at 1000  Please call the care guide team at [phone] if you need to cancel or reschedule your appointment.   If you are experiencing a Mental Health or Behavioral Health Crisis or need someone to talk to, please call the Suicide and Crisis Lifeline: 988 call the [place] National Suicide Prevention Lifeline: 3181591661 or TTY: (603) 379-1712 TTY 415-040-6601) to talk to a trained counselor call 1-800-273-TALK (toll free, 24 hour hotline) call the [facility]: 812 236 0544 call 911   The  patient verbalized understanding of instructions, educational materials, and care plan provided today and DECLINED offer to receive copy of patient instructions, educational materials, and care plan.   The patient has been provided with contact information for the care management team and has been advised to call with any health related questions or concerns.   Kerry Chisolm L. Noelle Penner, RN, BSN, CCM [facility] Care Management Community Coordinator Office number [phone]

## 2022-06-23 NOTE — Patient Outreach (Signed)
  Care Coordination   Initial Visit Note   06/23/2022 Name: [name] MRN: [id number] DOB: Jan 27, 1929  [name] is a 89 y.o. year old female who sees Stacks, Broadus John, MD for primary care. I spoke with  [name] by phone today.  What matters to the patients health and wellness today?  Very pleasant, knowledgeable 89 year old female reports she is improving. She discussed the following as some health concerns that matter right now to her  Walking steady with her cane, increase voiding, weight 100-107 lb/malnutrition, fatigue, improving diet " the smell of food use to make me sick"   Hypothyroidism She confirms taking her hypothyroid medicine as ordered with frequent checks of her TSH. TSH has remained within normal limit Went to church yesterday and enjoyed being there as she had not been in a while Daughters go with her to appointment   Goals Addressed             This Visit's Progress    [facility] care cocrdination services       Interventions Today    Flowsheet Row Most Recent Value  Chronic Disease   Chronic disease during today's visit Other, Chronic Obstructive Pulmonary Disease (COPD)  [hypothyroidism, unsteady mobility, fatiue, weight management]  General Interventions   General Interventions Discussed/Reviewed General Interventions Discussed, Labs, Durable Medical Equipment (DME), Doctor Visits  Labs Kidney Function  [TSH]  Doctor Visits Discussed/Reviewed Doctor Visits Reviewed, PCP, Specialist  Durable Medical Equipment (DME) --  [cane]  PCP/Specialist Visits Compliance with follow-up visit  Education Interventions   Provided Verbal Education On Nutrition  Nutrition Interventions   Nutrition Discussed/Reviewed Nutrition Reviewed, Increaing proteins  Pharmacy Interventions   Pharmacy Dicussed/Reviewed Pharmacy Topics Reviewed, Medications and their functions  Safety Interventions   Safety Discussed/Reviewed Safety Reviewed, Fall Risk  Advanced Directive  Interventions   Advanced Directives Discussed/Reviewed Advanced Directives Discussed              SDOH assessments and interventions completed:  Yes     Care Coordination Interventions:  Yes, provided   Follow up plan: Follow up call scheduled for 08/25/22    Encounter Outcome:  Pt. Visit Completed   Telisa Ohlsen L. Noelle Penner, RN, BSN, CCM [facility] Care Management Community Coordinator Office number [phone]

## 2022-06-24 DIAGNOSIS — I272 Pulmonary hypertension, unspecified: Secondary | ICD-10-CM | POA: Diagnosis not present

## 2022-06-24 DIAGNOSIS — I252 Old myocardial infarction: Secondary | ICD-10-CM | POA: Diagnosis not present

## 2022-06-24 DIAGNOSIS — I480 Paroxysmal atrial fibrillation: Secondary | ICD-10-CM | POA: Diagnosis not present

## 2022-06-24 DIAGNOSIS — I429 Cardiomyopathy, unspecified: Secondary | ICD-10-CM | POA: Diagnosis not present

## 2022-06-24 DIAGNOSIS — I2510 Atherosclerotic heart disease of native coronary artery without angina pectoris: Secondary | ICD-10-CM | POA: Diagnosis not present

## 2022-06-24 DIAGNOSIS — I130 Hypertensive heart and chronic kidney disease with heart failure and stage 1 through stage 4 chronic kidney disease, or unspecified chronic kidney disease: Secondary | ICD-10-CM | POA: Diagnosis not present

## 2022-06-24 DIAGNOSIS — I452 Bifascicular block: Secondary | ICD-10-CM | POA: Diagnosis not present

## 2022-06-24 DIAGNOSIS — N1831 Chronic kidney disease, stage 3a: Secondary | ICD-10-CM | POA: Diagnosis not present

## 2022-06-24 DIAGNOSIS — I5023 Acute on chronic systolic (congestive) heart failure: Secondary | ICD-10-CM | POA: Diagnosis not present

## 2022-07-01 ENCOUNTER — Other Ambulatory Visit: Payer: Self-pay | Admitting: *Deleted

## 2022-07-01 DIAGNOSIS — I480 Paroxysmal atrial fibrillation: Secondary | ICD-10-CM

## 2022-07-01 MED ORDER — APIXABAN 2.5 MG PO TABS: 2.5000 mg | ORAL_TABLET | Freq: Two times a day (BID) | ORAL | 1 refills | Status: DC

## 2022-07-01 NOTE — Telephone Encounter (Signed)
Eliquis 2.5mg  refill request received. Patient is 89 years old, weight-45.6kg, Crea-1.26 on 06/02/22, Diagnosis-Afib, and last seen by Dr. Eden Emms on 06/02/22. Dose is appropriate based on dosing criteria. Will send in refill to requested pharmacy.

## 2022-07-02 ENCOUNTER — Other Ambulatory Visit: Payer: Medicare HMO

## 2022-07-02 DIAGNOSIS — N1831 Chronic kidney disease, stage 3a: Secondary | ICD-10-CM | POA: Diagnosis not present

## 2022-07-02 DIAGNOSIS — I480 Paroxysmal atrial fibrillation: Secondary | ICD-10-CM | POA: Diagnosis not present

## 2022-07-02 DIAGNOSIS — I272 Pulmonary hypertension, unspecified: Secondary | ICD-10-CM | POA: Diagnosis not present

## 2022-07-02 DIAGNOSIS — I130 Hypertensive heart and chronic kidney disease with heart failure and stage 1 through stage 4 chronic kidney disease, or unspecified chronic kidney disease: Secondary | ICD-10-CM | POA: Diagnosis not present

## 2022-07-02 DIAGNOSIS — I2510 Atherosclerotic heart disease of native coronary artery without angina pectoris: Secondary | ICD-10-CM | POA: Diagnosis not present

## 2022-07-02 DIAGNOSIS — I252 Old myocardial infarction: Secondary | ICD-10-CM | POA: Diagnosis not present

## 2022-07-02 DIAGNOSIS — I452 Bifascicular block: Secondary | ICD-10-CM | POA: Diagnosis not present

## 2022-07-02 DIAGNOSIS — I5023 Acute on chronic systolic (congestive) heart failure: Secondary | ICD-10-CM | POA: Diagnosis not present

## 2022-07-02 DIAGNOSIS — I429 Cardiomyopathy, unspecified: Secondary | ICD-10-CM | POA: Diagnosis not present

## 2022-07-03 ENCOUNTER — Other Ambulatory Visit: Payer: Medicare HMO

## 2022-07-03 DIAGNOSIS — R7989 Other specified abnormal findings of blood chemistry: Secondary | ICD-10-CM | POA: Diagnosis not present

## 2022-07-03 DIAGNOSIS — Z79899 Other long term (current) drug therapy: Secondary | ICD-10-CM

## 2022-07-03 DIAGNOSIS — Z9889 Other specified postprocedural states: Secondary | ICD-10-CM | POA: Diagnosis not present

## 2022-07-03 DIAGNOSIS — E876 Hypokalemia: Secondary | ICD-10-CM

## 2022-07-03 DIAGNOSIS — R0609 Other forms of dyspnea: Secondary | ICD-10-CM

## 2022-07-04 ENCOUNTER — Other Ambulatory Visit (HOSPITAL_COMMUNITY): Payer: Self-pay

## 2022-07-04 LAB — BASIC METABOLIC PANEL
BUN/Creatinine Ratio: 19 (ref 12–28)
BUN: 28 mg/dL (ref 10–36)
CO2: 23 mmol/L (ref 20–29)
Calcium: 10.1 mg/dL (ref 8.7–10.3)
Chloride: 101 mmol/L (ref 96–106)
Creatinine, Ser: 1.47 mg/dL — ABNORMAL HIGH (ref 0.57–1.00)
Glucose: 95 mg/dL (ref 70–99)
Potassium: 4.4 mmol/L (ref 3.5–5.2)
Sodium: 142 mmol/L (ref 134–144)
eGFR: 33 mL/min/{1.73_m2} — ABNORMAL LOW (ref >59)

## 2022-07-04 LAB — PRO B NATRIURETIC PEPTIDE: NT-Pro BNP: 1327 pg/mL — ABNORMAL HIGH (ref 0–738)

## 2022-07-08 ENCOUNTER — Ambulatory Visit: Payer: Medicare HMO | Admitting: Family Medicine

## 2022-07-08 DIAGNOSIS — N1831 Chronic kidney disease, stage 3a: Secondary | ICD-10-CM | POA: Diagnosis not present

## 2022-07-08 DIAGNOSIS — I272 Pulmonary hypertension, unspecified: Secondary | ICD-10-CM | POA: Diagnosis not present

## 2022-07-08 DIAGNOSIS — I130 Hypertensive heart and chronic kidney disease with heart failure and stage 1 through stage 4 chronic kidney disease, or unspecified chronic kidney disease: Secondary | ICD-10-CM | POA: Diagnosis not present

## 2022-07-08 DIAGNOSIS — I452 Bifascicular block: Secondary | ICD-10-CM | POA: Diagnosis not present

## 2022-07-08 DIAGNOSIS — I5023 Acute on chronic systolic (congestive) heart failure: Secondary | ICD-10-CM | POA: Diagnosis not present

## 2022-07-08 DIAGNOSIS — I480 Paroxysmal atrial fibrillation: Secondary | ICD-10-CM | POA: Diagnosis not present

## 2022-07-08 DIAGNOSIS — I429 Cardiomyopathy, unspecified: Secondary | ICD-10-CM | POA: Diagnosis not present

## 2022-07-08 DIAGNOSIS — I252 Old myocardial infarction: Secondary | ICD-10-CM | POA: Diagnosis not present

## 2022-07-08 DIAGNOSIS — I2510 Atherosclerotic heart disease of native coronary artery without angina pectoris: Secondary | ICD-10-CM | POA: Diagnosis not present

## 2022-07-16 DIAGNOSIS — I272 Pulmonary hypertension, unspecified: Secondary | ICD-10-CM | POA: Diagnosis not present

## 2022-07-16 DIAGNOSIS — I5023 Acute on chronic systolic (congestive) heart failure: Secondary | ICD-10-CM | POA: Diagnosis not present

## 2022-07-16 DIAGNOSIS — I252 Old myocardial infarction: Secondary | ICD-10-CM | POA: Diagnosis not present

## 2022-07-16 DIAGNOSIS — I429 Cardiomyopathy, unspecified: Secondary | ICD-10-CM | POA: Diagnosis not present

## 2022-07-16 DIAGNOSIS — I452 Bifascicular block: Secondary | ICD-10-CM | POA: Diagnosis not present

## 2022-07-16 DIAGNOSIS — N1831 Chronic kidney disease, stage 3a: Secondary | ICD-10-CM | POA: Diagnosis not present

## 2022-07-16 DIAGNOSIS — I2510 Atherosclerotic heart disease of native coronary artery without angina pectoris: Secondary | ICD-10-CM | POA: Diagnosis not present

## 2022-07-16 DIAGNOSIS — I130 Hypertensive heart and chronic kidney disease with heart failure and stage 1 through stage 4 chronic kidney disease, or unspecified chronic kidney disease: Secondary | ICD-10-CM | POA: Diagnosis not present

## 2022-07-16 DIAGNOSIS — I480 Paroxysmal atrial fibrillation: Secondary | ICD-10-CM | POA: Diagnosis not present

## 2022-07-17 DIAGNOSIS — I429 Cardiomyopathy, unspecified: Secondary | ICD-10-CM | POA: Diagnosis not present

## 2022-07-17 DIAGNOSIS — I5023 Acute on chronic systolic (congestive) heart failure: Secondary | ICD-10-CM | POA: Diagnosis not present

## 2022-07-17 DIAGNOSIS — I452 Bifascicular block: Secondary | ICD-10-CM | POA: Diagnosis not present

## 2022-07-17 DIAGNOSIS — I2510 Atherosclerotic heart disease of native coronary artery without angina pectoris: Secondary | ICD-10-CM | POA: Diagnosis not present

## 2022-07-17 DIAGNOSIS — I480 Paroxysmal atrial fibrillation: Secondary | ICD-10-CM | POA: Diagnosis not present

## 2022-07-17 DIAGNOSIS — I130 Hypertensive heart and chronic kidney disease with heart failure and stage 1 through stage 4 chronic kidney disease, or unspecified chronic kidney disease: Secondary | ICD-10-CM | POA: Diagnosis not present

## 2022-07-17 DIAGNOSIS — I252 Old myocardial infarction: Secondary | ICD-10-CM | POA: Diagnosis not present

## 2022-07-17 DIAGNOSIS — I272 Pulmonary hypertension, unspecified: Secondary | ICD-10-CM | POA: Diagnosis not present

## 2022-07-17 DIAGNOSIS — N1831 Chronic kidney disease, stage 3a: Secondary | ICD-10-CM | POA: Diagnosis not present

## 2022-07-23 DIAGNOSIS — I452 Bifascicular block: Secondary | ICD-10-CM | POA: Diagnosis not present

## 2022-07-23 DIAGNOSIS — I272 Pulmonary hypertension, unspecified: Secondary | ICD-10-CM | POA: Diagnosis not present

## 2022-07-23 DIAGNOSIS — I480 Paroxysmal atrial fibrillation: Secondary | ICD-10-CM | POA: Diagnosis not present

## 2022-07-23 DIAGNOSIS — I429 Cardiomyopathy, unspecified: Secondary | ICD-10-CM | POA: Diagnosis not present

## 2022-07-23 DIAGNOSIS — I252 Old myocardial infarction: Secondary | ICD-10-CM | POA: Diagnosis not present

## 2022-07-23 DIAGNOSIS — I130 Hypertensive heart and chronic kidney disease with heart failure and stage 1 through stage 4 chronic kidney disease, or unspecified chronic kidney disease: Secondary | ICD-10-CM | POA: Diagnosis not present

## 2022-07-23 DIAGNOSIS — N1831 Chronic kidney disease, stage 3a: Secondary | ICD-10-CM | POA: Diagnosis not present

## 2022-07-23 DIAGNOSIS — I5023 Acute on chronic systolic (congestive) heart failure: Secondary | ICD-10-CM | POA: Diagnosis not present

## 2022-07-23 DIAGNOSIS — I2510 Atherosclerotic heart disease of native coronary artery without angina pectoris: Secondary | ICD-10-CM | POA: Diagnosis not present

## 2022-07-24 DIAGNOSIS — M79676 Pain in unspecified toe(s): Secondary | ICD-10-CM | POA: Diagnosis not present

## 2022-07-24 DIAGNOSIS — B351 Tinea unguium: Secondary | ICD-10-CM | POA: Diagnosis not present

## 2022-08-05 NOTE — Progress Notes (Signed)
Patient ID: [name], female   DOB: 1928/12/24, 89 y.o.   MRN: [id number]      89 y.o. f/u post MV repair TTE 01/12/19 showed trace MR with low diastolic gradients and normal EF 60%.  She has HTN on ARB More dizziness on 50 mg Cozaar dose decreased. She thinks statin Makes her feel worse and now taking Qod.  Some LE varicosities   Depression continues. Husband [name] was a patient of mine and died of lung cancer 6 years ago They were married 60 years. Shortly after that [name] died tragically in there back yard of a chain saw accident. Son [name] living with her and two daughters near by.   Has had some memory issues and has been on namenda and aricept  Hospitalized 09/27/20 with COVID causing afib and new DCM with TTE showing EF 30-35% MV repair fine with trivial MR no MS mean gradient 4 mmhg  Converted with amiodarone to slow sinus and aricept d/c Added Toprol, ARB and eliquis 2.5 bid  TTE 01/10/21 EF improved 35-40%   December 2022 more dyspnea Primary ? Sinusitis and started her on Augmentin Cough with sputum rhiorrhea and sore throat with cough and confusion from Namenda/Aricept   Called office again 11/25/20 complaining of dyspnea and more rapid HR;s  She had CT 04/17/21 showing bronchiectasis with mucous plugging in RML and multifocal infiltrates Seen by pulmonary Dr Vassie Loll 05/15/21 thought to be improving but appears to have bronchiectasis and MAI  11/28/21:  in rapid fib/flutter She clearly gets dyspnea and fatigue with minimum walking She was started on amiodarone and converted when seen in f/u at Afib clinic on 01/08/22   05/17/22-05/20/22 Admitted with CHF and afib diuresed and rate control with iv digoxin and lopressor changed to coreg Sent home on lasix 20 mg daily and amiodarone 200 mg Readmitted 3/17 with azotemia and Cr 3.3 Benicar d/c and sent home on lasix 20 mg daily again BNP elevated 07/03/22 elevated 1327 Inspra added 06/04/22   Primary wanted to stop diuretic and start beta  blocker again Not indicated as she has had bradycardia and still needs diuretic for CHF and elevated BNP  There were no vitals taken for this visit.  Affect appropriate Healthy:  appears stated age HEENT: normal Neck supple with no adenopathy JVP normal no bruits no thyromegaly Lungs clear with no wheezing and good diaphragmatic motion Heart:  S1/S2 no murmur, no rub, gallop or click PMI normal  Post sternotomy  Abdomen: benighn, BS positve, no tenderness, no AAA no bruit.  No HSM or HJR Distal pulses intact with no bruits No edema Neuro non-focal Skin warm and dry No muscular weakness   Current Outpatient Medications  Medication Sig Dispense Refill   acetaminophen (TYLENOL) 325 MG tablet Take 325 mg by mouth as needed for moderate pain.     amiodarone (PACERONE) 200 MG tablet Take 0.5 tablets (100 mg total) by mouth daily. Take one tablet by mouth daily     apixaban (ELIQUIS) 2.5 MG TABS tablet Take 1 tablet (2.5 mg total) by mouth 2 (two) times daily. 180 tablet 1   atorvastatin (LIPITOR) 10 MG tablet Take 1 tablet (10 mg total) by mouth daily. 90 tablet 3   Biotin 1000 MCG tablet Take 1,000 mcg by mouth daily.     cholecalciferol 25 MCG (1000 UT) tablet Take 1,000 Units by mouth daily.     eplerenone (INSPRA) 25 MG tablet Take 1 tablet (25 mg total) by mouth daily.  90 tablet 3   furosemide (LASIX) 20 MG tablet Take 1 tablet (20 mg total) by mouth daily. Take 2 tablets in case of weight gain 2 to 3 lbs in 24 hrs or 5 lbs in 7 days. (Patient taking differently: Take 20 mg by mouth daily.) 30 tablet 3   No current facility-administered medications for this visit.    Allergies  Donepezil, Actonel [risedronate sodium], Aspirin-caffeine, Codeine, Memantine, Penicillins, and Sulfonamide derivatives  Electrocardiogram:   12/02/17  SR rate 62 LAD RBBB 08/05/2022 SR rate 58 RBBB/LAD  08/05/2022 afib/flutter rate 122 RBBB LAD   Assessment and Plan  MV Repair:  SBE prophylaxis  discussed  Echo 05/18/22 repair intact mild MR    HTN: Well controlled.  Continue current medications and low sodium Dash type diet.    Thyroid:  On replacement TSH 3.14 September 2019 normal   PAF: continue low dose eliquis given age weight 45 kg and elevated recent Cr with azotemia Continue amiodarone Beta blocker d/c due to sinus pauses   DCM:  EF stable TTE 05/18/22 30-35% beta blocker d/c due to SSS and pauses ARB d/c due to azotemia and markedly increase Cr Continue current dose of lasix and Inspra  Memory: Aricept d/c in hospital due to bradycardia Sent Dr Darlyn Read a message never to restart and she appears to be having side effects from Namenda also d/c   HLD:  continue liptor   BMET/BNP  F/U in 6 months   Charlton Haws

## 2022-08-11 ENCOUNTER — Encounter: Payer: Self-pay | Admitting: Family Medicine

## 2022-08-11 ENCOUNTER — Ambulatory Visit (INDEPENDENT_AMBULATORY_CARE_PROVIDER_SITE_OTHER): Payer: Medicare HMO | Admitting: Family Medicine

## 2022-08-11 VITALS — BP 162/72 | HR 68 | Temp 97.2°F | Ht 63.0 in | Wt 102.8 lb

## 2022-08-11 DIAGNOSIS — I10 Essential (primary) hypertension: Secondary | ICD-10-CM

## 2022-08-11 MED ORDER — METOPROLOL SUCCINATE ER 25 MG PO TB24: 25.0000 mg | ORAL_TABLET | Freq: Every day | ORAL | 1 refills | Status: DC

## 2022-08-11 NOTE — Progress Notes (Signed)
Subjective:  Patient ID: [name], female    DOB: 28-Jan-1929  Age: 89 y.o. MRN: [id number]  CC: Medical Management of Chronic Issues   HPI [name] presents for  follow-up of hypertension. Patient has no history of headache chest pain or shortness of breath or recent cough. Patient also denies symptoms of TIA such as focal numbness or weakness. Patient denies side effects from medication. States taking it regularly.   History [name] has a past medical history of Arthritis, Atrial fibrillation (HCC), COVID-19, CVA (cerebral vascular accident) (HCC), HTN (hypertension), MR (mitral regurgitation), NSTEMI (non-ST elevated myocardial infarction) (HCC), Orthostatic hypotension (11/06/2011), and Pericarditis.   She has a past surgical history that includes anterior repair with perigee graft (04/16/06); Anterior and posterior vaginal repair (10/15/04); sacral spinous ligament suspension of vagina; suprapubic cystectomy; Total abdominal hysterectomy w/ bilateral salpingoophorectomy; and Mitral valve replacement.   Her family history includes Colon cancer in her brother; Heart attack in her father; Heart disease in her brother, father, and mother; Lupus in her father.She reports that she has never smoked. She has never used smokeless tobacco. She reports that she does not drink alcohol and does not use drugs.  Current Outpatient Medications on File Prior to Visit  Medication Sig Dispense Refill   acetaminophen (TYLENOL) 325 MG tablet Take 325 mg by mouth as needed for moderate pain.     amiodarone (PACERONE) 200 MG tablet Take 0.5 tablets (100 mg total) by mouth daily. Take one tablet by mouth daily     apixaban (ELIQUIS) 2.5 MG TABS tablet Take 1 tablet (2.5 mg total) by mouth 2 (two) times daily. 180 tablet 1   atorvastatin (LIPITOR) 10 MG tablet Take 1 tablet (10 mg total) by mouth daily. 90 tablet 3   Biotin 1000 MCG tablet Take 1,000 mcg by mouth daily.     cholecalciferol 25 MCG (1000 UT)  tablet Take 1,000 Units by mouth daily.     eplerenone (INSPRA) 25 MG tablet Take 1 tablet (25 mg total) by mouth daily. 90 tablet 3   furosemide (LASIX) 20 MG tablet Take 1 tablet (20 mg total) by mouth daily. Take 2 tablets in case of weight gain 2 to 3 lbs in 24 hrs or 5 lbs in 7 days. (Patient taking differently: Take 20 mg by mouth daily.) 30 tablet 3   No current facility-administered medications on file prior to visit.    ROS Review of Systems  Constitutional: Negative.   HENT: Negative.    Eyes:  Negative for visual disturbance.  Respiratory:  Negative for shortness of breath.   Cardiovascular:  Negative for chest pain.  Gastrointestinal:  Negative for abdominal pain.  Musculoskeletal:  Negative for arthralgias.    Objective:  BP (!) 162/72   Pulse 68   Temp (!) 97.2 F (36.2 C)   Ht 5\' 3"  (1.6 m)   Wt 102 lb 12.8 oz (46.6 kg)   SpO2 99%   BMI 18.21 kg/m   BP Readings from Last 3 Encounters:  08/11/22 (!) 162/72  06/02/22 120/62  05/28/22 138/73    Wt Readings from Last 3 Encounters:  08/11/22 102 lb 12.8 oz (46.6 kg)  06/02/22 100 lb 9.6 oz (45.6 kg)  05/28/22 101 lb (45.8 kg)     Physical Exam    Assessment & Plan:   [name] was seen today for medical management of chronic issues.  Diagnoses and all orders for this visit:  Essential hypertension  Other orders -  metoprolol succinate (TOPROL-XL) 25 MG 24 hr tablet; Take 1 tablet (25 mg total) by mouth daily. For blood pressure and heart failure   Allergies as of 08/11/2022       Reactions   Donepezil Other (See Comments)   bradycardia   Actonel [risedronate Sodium] Nausea And Vomiting   Aspirin-caffeine Other (See Comments)   Upset stomach   Codeine Other (See Comments)   Upset stomach   Memantine Other (See Comments)   Unknown    Penicillins Other (See Comments)   Unknown    Sulfonamide Derivatives Other (See Comments)   Swollen tongue        Medication List        Accurate as  of August 11, 2022  4:24 PM. If you have any questions, ask your nurse or doctor.          acetaminophen 325 MG tablet Commonly known as: TYLENOL Take 325 mg by mouth as needed for moderate pain.   amiodarone 200 MG tablet Commonly known as: PACERONE Take 0.5 tablets (100 mg total) by mouth daily. Take one tablet by mouth daily   apixaban 2.5 MG Tabs tablet Commonly known as: ELIQUIS Take 1 tablet (2.5 mg total) by mouth 2 (two) times daily.   atorvastatin 10 MG tablet Commonly known as: LIPITOR Take 1 tablet (10 mg total) by mouth daily.   Biotin 1000 MCG tablet Take 1,000 mcg by mouth daily.   cholecalciferol 25 MCG (1000 UNIT) tablet Commonly known as: VITAMIN D3 Take 1,000 Units by mouth daily.   eplerenone 25 MG tablet Commonly known as: Inspra Take 1 tablet (25 mg total) by mouth daily.   furosemide 20 MG tablet Commonly known as: LASIX Take 1 tablet (20 mg total) by mouth daily. Take 2 tablets in case of weight gain 2 to 3 lbs in 24 hrs or 5 lbs in 7 days. What changed: additional instructions   metoprolol succinate 25 MG 24 hr tablet Commonly known as: TOPROL-XL Take 1 tablet (25 mg total) by mouth daily. For blood pressure and heart failure Started by: Mechele Claude, MD        Meds ordered this encounter  Medications   metoprolol succinate (TOPROL-XL) 25 MG 24 hr tablet    Sig: Take 1 tablet (25 mg total) by mouth daily. For blood pressure and heart failure    Dispense:  90 tablet    Refill:  1      Follow-up: Return in about 3 months (around 11/11/2022).  Mechele Claude, M.D.

## 2022-08-15 ENCOUNTER — Ambulatory Visit: Payer: Medicare HMO | Attending: Cardiovascular Disease | Admitting: Cardiovascular Disease

## 2022-08-15 ENCOUNTER — Encounter: Payer: Self-pay | Admitting: Cardiovascular Disease

## 2022-08-15 VITALS — BP 136/72 | HR 73 | Ht 63.0 in | Wt 102.8 lb

## 2022-08-15 DIAGNOSIS — I420 Dilated cardiomyopathy: Secondary | ICD-10-CM | POA: Diagnosis not present

## 2022-08-15 DIAGNOSIS — I480 Paroxysmal atrial fibrillation: Secondary | ICD-10-CM

## 2022-08-15 DIAGNOSIS — R0609 Other forms of dyspnea: Secondary | ICD-10-CM

## 2022-08-15 NOTE — Patient Instructions (Addendum)
Medication Instructions:  Your physician recommends that you continue on your current medications as directed. Please refer to the Current Medication list given to you today.  *If you need a refill on your cardiac medications before your next appointment, please call your pharmacy*   Lab Work: BNP and BMET today  If you have labs (blood work) drawn today and your tests are completely normal, you will receive your results only by: MyChart Message (if you have MyChart) OR A paper copy in the mail If you have any lab test that is abnormal or we need to change your treatment, we will call you to review the results.  Follow-Up: At [facility], you and your health needs are our priority.  As part of our continuing mission to provide you with exceptional heart care, we have created designated Provider Care Teams.  These Care Teams include your primary Cardiologist (physician) and Advanced Practice Providers (APPs -  Physician Assistants and Nurse Practitioners) who all work together to provide you with the care you need, when you need it.  We recommend signing up for the patient portal called "MyChart".  Sign up information is provided on this After Visit Summary.  MyChart is used to connect with patients for Virtual Visits (Telemedicine).  Patients are able to view lab/test results, encounter notes, upcoming appointments, etc.  Non-urgent messages can be sent to your provider as well.   To learn more about what you can do with MyChart, go to [email or web address].    Your next appointment:   6 month(s)  Provider:   Charlton Haws, MD

## 2022-08-16 LAB — BASIC METABOLIC PANEL
BUN/Creatinine Ratio: 18 (ref 12–28)
BUN: 24 mg/dL (ref 10–36)
CO2: 26 mmol/L (ref 20–29)
Calcium: 9.9 mg/dL (ref 8.7–10.3)
Chloride: 103 mmol/L (ref 96–106)
Creatinine, Ser: 1.33 mg/dL — ABNORMAL HIGH (ref 0.57–1.00)
Glucose: 102 mg/dL — ABNORMAL HIGH (ref 70–99)
Potassium: 4.3 mmol/L (ref 3.5–5.2)
Sodium: 141 mmol/L (ref 134–144)
eGFR: 38 mL/min/{1.73_m2} — ABNORMAL LOW (ref >59)

## 2022-08-16 LAB — PRO B NATRIURETIC PEPTIDE: NT-Pro BNP: 1290 pg/mL — ABNORMAL HIGH (ref 0–738)

## 2022-08-25 ENCOUNTER — Ambulatory Visit: Payer: Self-pay | Admitting: *Deleted

## 2022-08-25 NOTE — Patient Outreach (Signed)
  Care Coordination   Follow Up Visit Note   08/25/2022 Name: [name] MRN: [id number] DOB: 12-Jan-1929  [name] is a 89 y.o. year old female who sees Stacks, Broadus John, MD for primary care. I spoke with  daughter, ANJELICIA KNABB by phone today.  What matters to the patients health and wellness today?  Patient improving, becoming more active. Out of home today. Had a full week to doctor visits, church, early morning yard work.  Rosey Bath denies any medical needs this month Voiced appreciation of follow up outreach   Goals Addressed             This Visit's Progress    [facility] care cocrdination services   On track    Interventions Today    Flowsheet Row Most Recent Value  Chronic Disease   Chronic disease during today's visit Other, Chronic Obstructive Pulmonary Disease (COPD)  [hypothyroidism, unsteady mobility, fatiue, weight management]  General Interventions   General Interventions Discussed/Reviewed General Interventions Discussed, Labs, Durable Medical Equipment (DME), Doctor Visits  Labs Kidney Function  [TSH]  Doctor Visits Discussed/Reviewed Doctor Visits Reviewed, PCP, Specialist  Durable Medical Equipment (DME) --  [cane]  PCP/Specialist Visits Compliance with follow-up visit  Education Interventions   Provided Verbal Education On Nutrition  Nutrition Interventions   Nutrition Discussed/Reviewed Nutrition Reviewed, Increaing proteins  Pharmacy Interventions   Pharmacy Dicussed/Reviewed Pharmacy Topics Reviewed, Medications and their functions  Safety Interventions   Safety Discussed/Reviewed Safety Reviewed, Fall Risk  Advanced Directive Interventions   Advanced Directives Discussed/Reviewed Advanced Directives Discussed              SDOH assessments and interventions completed:  No     Care Coordination Interventions:  Yes, provided   Follow up plan: Follow up call scheduled for 11/18/22    Encounter Outcome:  Pt. Visit Completed   Sholanda Croson L.  Noelle Penner, RN, BSN, CCM [facility] Care Management Community Coordinator Office number [phone]

## 2022-08-25 NOTE — Patient Instructions (Addendum)
Visit Information  Thank you for taking time to visit with me today. Please don't hesitate to contact me if I can be of assistance to you.   Following are the goals we discussed today:   Goals Addressed             This Visit's Progress    [facility] care cocrdination services   On track    Interventions Today    Flowsheet Row Most Recent Value  Chronic Disease   Chronic disease during today's visit Chronic Obstructive Pulmonary Disease (COPD), Other  [mobility fatigue]  General Interventions   General Interventions Discussed/Reviewed General Interventions Reviewed, Doctor Visits  Doctor Visits Discussed/Reviewed Doctor Visits Reviewed, PCP, Specialist  PCP/Specialist Visits Compliance with follow-up visit  Exercise Interventions   Exercise Discussed/Reviewed Exercise Reviewed, Physical Activity, Assistive device use and maintanence  Physical Activity Discussed/Reviewed Physical Activity Reviewed, Home Exercise Program (HEP)  Education Interventions   Education Provided Provided Education  Provided Verbal Education On Exercise  [encouraged to keep active as tolerated]  Mental Health Interventions   Mental Health Discussed/Reviewed Mental Health Reviewed, Coping Strategies, Grief and Loss  Pharmacy Interventions   Pharmacy Dicussed/Reviewed Pharmacy Topics Reviewed, Affording Medications  Safety Interventions   Safety Discussed/Reviewed Safety Reviewed, Home Safety, Fall Risk  Home Safety Assistive Devices              Our next appointment is by telephone on 11/18/22 at 1000  Please call the care guide team at [phone] if you need to cancel or reschedule your appointment.   If you are experiencing a Mental Health or Behavioral Health Crisis or need someone to talk to, please call the Suicide and Crisis Lifeline: 988 call the [place] National Suicide Prevention Lifeline: 941-026-1059 or TTY: 303-807-6493 TTY 9856368808) to talk to a trained counselor call 1-800-273-TALK  (toll free, 24 hour hotline) call the [facility]: 7751459922 call 911   The patient verbalized understanding of instructions, educational materials, and care plan provided today and DECLINED offer to receive copy of patient instructions, educational materials, and care plan.   The patient has been provided with contact information for the care management team and has been advised to call with any health related questions or concerns.   Cagney Steenson L. Noelle Penner, RN, BSN, CCM [facility] Care Management Community Coordinator Office number [phone]

## 2022-08-25 NOTE — Patient Outreach (Signed)
  Care Coordination   08/25/2022 Name: [name] MRN: [id number] DOB: [birth year]   Care Coordination Outreach Attempts:  An unsuccessful telephone outreach was attempted today to offer the patient information about available care coordination services.  Follow Up Plan:  Additional outreach attempts will be made to offer the patient care coordination information and services.   Encounter Outcome:  No Answer   Care Coordination Interventions:  No, not indicated    Elton Heid L. Noelle Penner, RN, BSN, CCM [facility] Care Management Community Coordinator Office number [phone]

## 2022-09-15 ENCOUNTER — Other Ambulatory Visit: Payer: Self-pay | Admitting: Family Medicine

## 2022-09-15 NOTE — Telephone Encounter (Signed)
Last office visit 08/11/22 Medication is on med list but shows prescribed by another office previously

## 2022-09-18 ENCOUNTER — Ambulatory Visit: Payer: Medicare HMO | Admitting: Cardiovascular Disease

## 2022-09-23 DIAGNOSIS — D0439 Carcinoma in situ of skin of other parts of face: Secondary | ICD-10-CM | POA: Diagnosis not present

## 2022-09-23 DIAGNOSIS — L821 Other seborrheic keratosis: Secondary | ICD-10-CM | POA: Diagnosis not present

## 2022-09-23 DIAGNOSIS — L570 Actinic keratosis: Secondary | ICD-10-CM | POA: Diagnosis not present

## 2022-09-23 DIAGNOSIS — D492 Neoplasm of unspecified behavior of bone, soft tissue, and skin: Secondary | ICD-10-CM | POA: Diagnosis not present

## 2022-10-15 ENCOUNTER — Other Ambulatory Visit: Payer: Self-pay | Admitting: Family Medicine

## 2022-10-30 DIAGNOSIS — M79676 Pain in unspecified toe(s): Secondary | ICD-10-CM | POA: Diagnosis not present

## 2022-10-30 DIAGNOSIS — B351 Tinea unguium: Secondary | ICD-10-CM | POA: Diagnosis not present

## 2022-11-11 DIAGNOSIS — L570 Actinic keratosis: Secondary | ICD-10-CM | POA: Diagnosis not present

## 2022-11-11 DIAGNOSIS — D0439 Carcinoma in situ of skin of other parts of face: Secondary | ICD-10-CM | POA: Diagnosis not present

## 2022-11-12 ENCOUNTER — Ambulatory Visit: Payer: Medicare HMO | Admitting: Family Medicine

## 2022-11-12 ENCOUNTER — Telehealth: Payer: Self-pay | Admitting: Family Medicine

## 2022-11-12 MED ORDER — FUROSEMIDE 20 MG PO TABS: 20.0000 mg | ORAL_TABLET | Freq: Every day | ORAL | 0 refills | Status: DC

## 2022-11-12 NOTE — Telephone Encounter (Signed)
Aware refill sent to pharmacy till appt in Oct

## 2022-11-12 NOTE — Telephone Encounter (Signed)
  Prescription Request  11/12/2022  Is this a "Controlled Substance" medicine? furosemide (LASIX) 20 MG tablet   Have you seen your PCP in the last 2 weeks? Pt has apt today but cancelled due to transportation. Pt r/s for 12/17/2022. Can she get a refill until apt?  If YES, route message to pool  -  If NO, patient needs to be scheduled for appointment.  What is the name of the medication or equipment? furosemide (LASIX) 20 MG tablet   Have you contacted your pharmacy to request a refill? no   Which pharmacy would you like this sent to? [facility]   Patient notified that their request is being sent to the clinical staff for review and that they should receive a response within 2 business days.

## 2022-11-18 ENCOUNTER — Ambulatory Visit: Payer: Self-pay | Admitting: *Deleted

## 2022-11-18 ENCOUNTER — Other Ambulatory Visit (HOSPITAL_COMMUNITY): Payer: Self-pay

## 2022-11-18 NOTE — Patient Outreach (Signed)
  Care Coordination   11/18/2022 Name: SHANEY AGUSTIN MRN: [id number] DOB: 11/23/1928   Care Coordination Outreach Attempts:  An unsuccessful telephone outreach was attempted for a scheduled appointment today.  Follow Up Plan:  Additional outreach attempts will be made to offer the patient care coordination information and services.   Encounter Outcome:  No Answer   Care Coordination Interventions:  No, not indicated    Gwin Eagon L. Noelle Penner, RN, BSN, CCM, Care Management Coordinator [phone]

## 2022-11-26 ENCOUNTER — Telehealth: Payer: Self-pay | Admitting: *Deleted

## 2022-11-26 NOTE — Progress Notes (Signed)
Care Coordination Note  11/26/2022 Name: [name] MRN: [id number] DOB: 1928-10-29  [name] is a 89 y.o. year old female who is a primary care patient of Stacks, Broadus John, MD and is actively engaged with the care management team. I reached out to [name] by phone today to assist with re-scheduling a follow up visit with the RN Case Manager  Follow up plan: Unsuccessful telephone outreach attempt made. A HIPAA compliant phone message was left for the patient providing contact information and requesting a return call.   [facility]  Care Coordination Care Guide  Direct Dial: [phone]

## 2022-11-29 IMAGING — CT CT CHEST W/O CM
2 of 4 series · 15 of 36 positions shown, 18 images · non-contrast
Comparison: 02/28/2021

CLINICAL DATA: Pneumonia with complications suspected. Unresolved
multifocal pneumonia on recent CT chest, clinically improved.
Follow-up of pneumonia.



[Series 3: routine chest without · axial · non-contrast · 0.61mm/px · z∈[+1397,+1651]mm · 12 of 151 slices shown, 15 images]
[im 12/151  mediastinal]
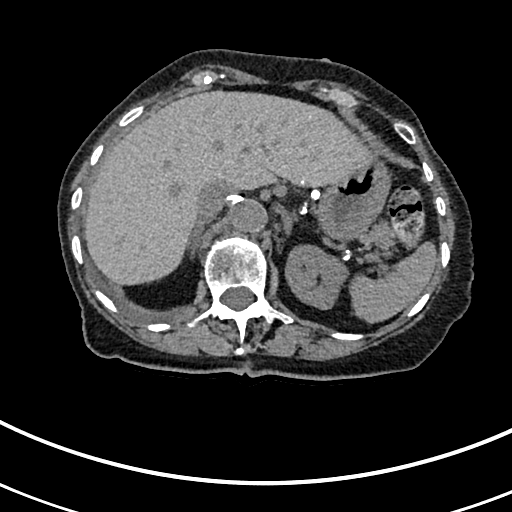
[im 12/151  lung]
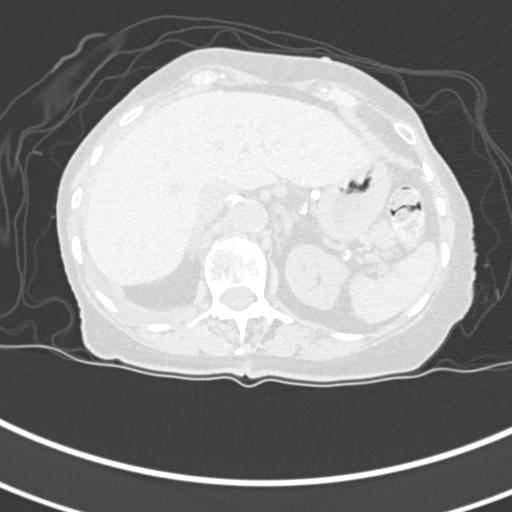
[im 24/151  lung]
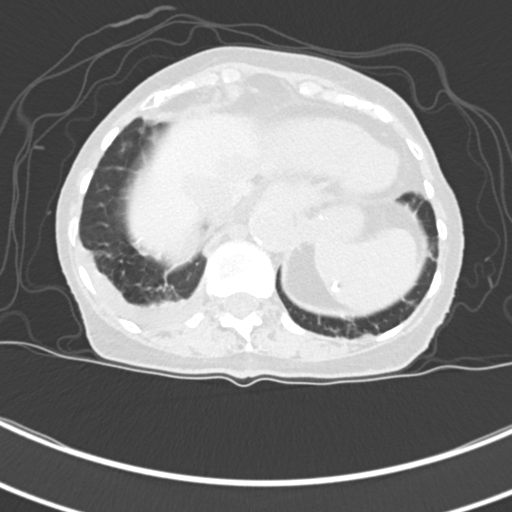
[im 35/151  lung]
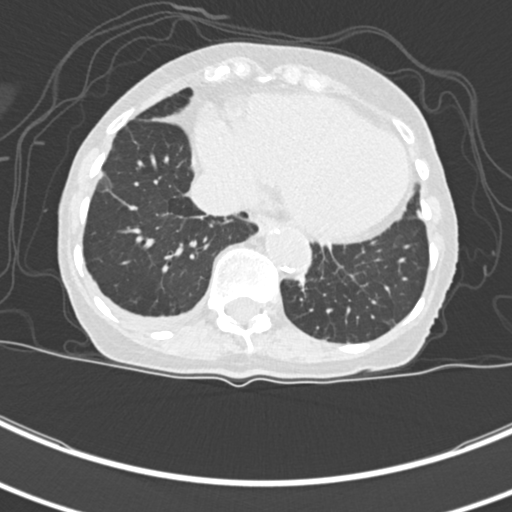
[im 47/151  lung]
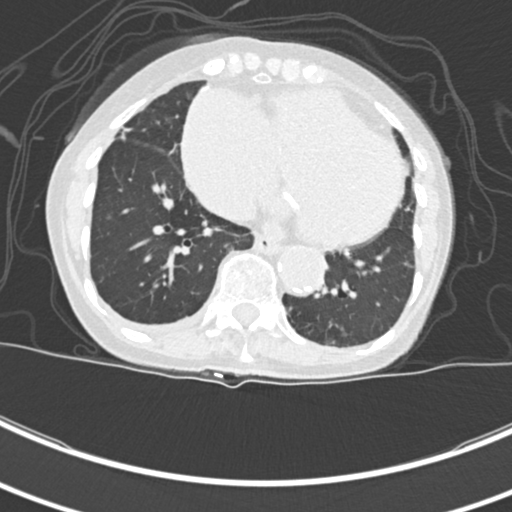
[im 58/151  mediastinal]
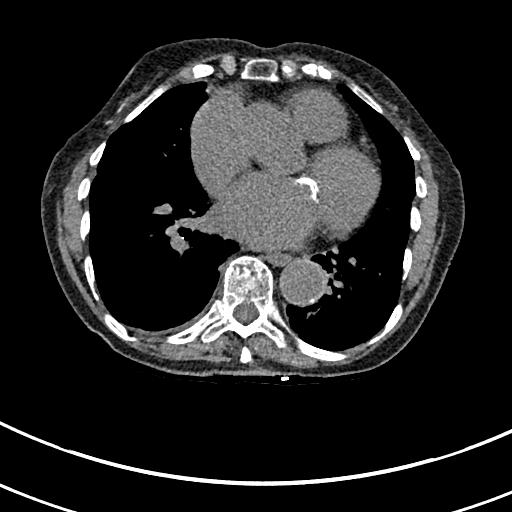
[im 58/151  lung]
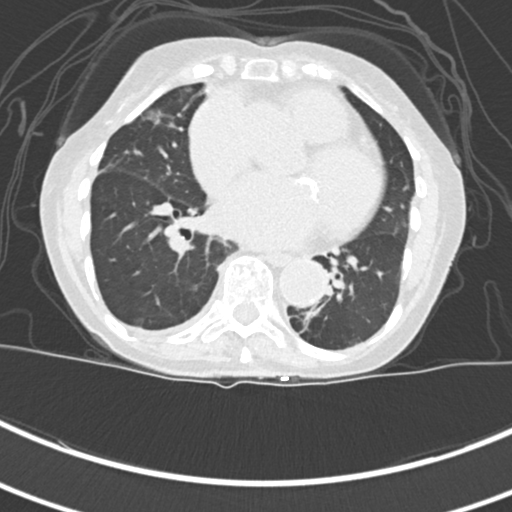
[im 70/151  lung]
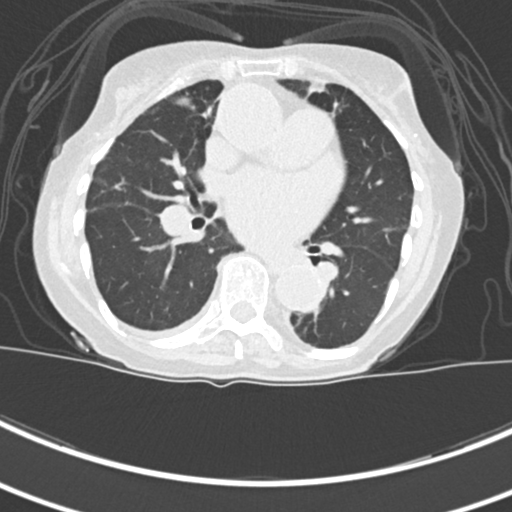
[im 81/151  lung]
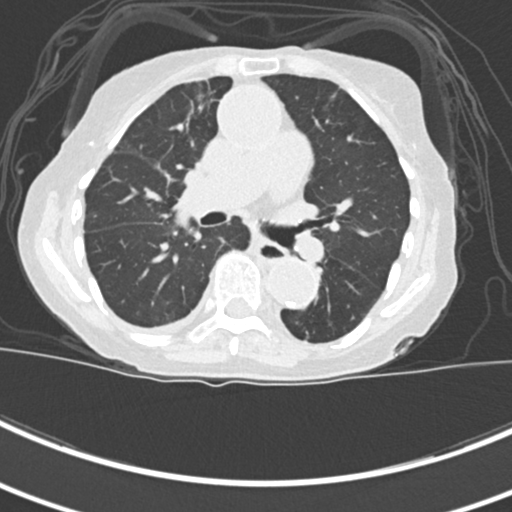
[im 93/151  lung]
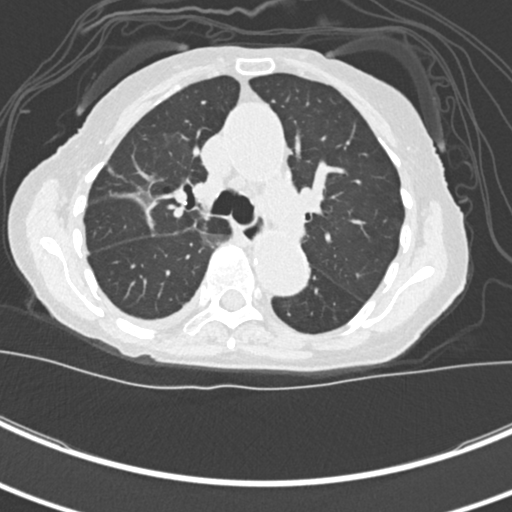
[im 104/151  mediastinal]
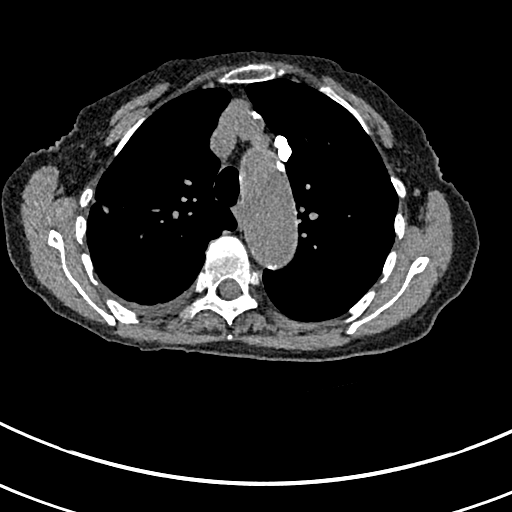
[im 104/151  lung]
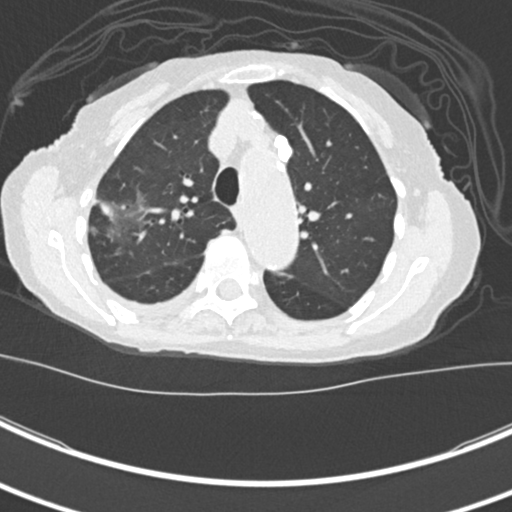
[im 116/151  lung]
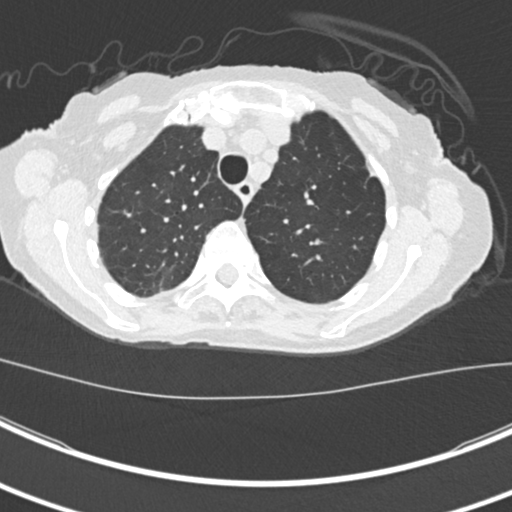
[im 127/151  lung]
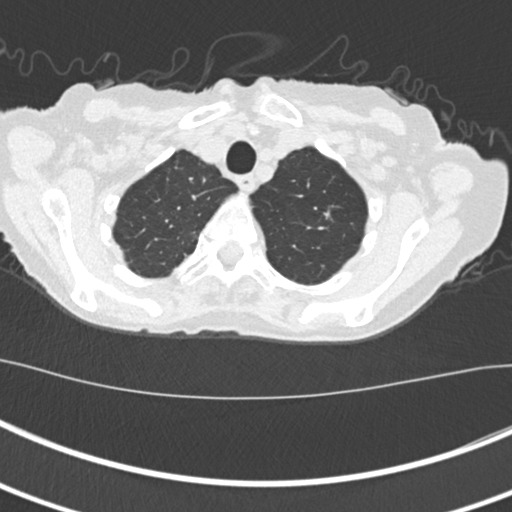
[im 139/151  lung]
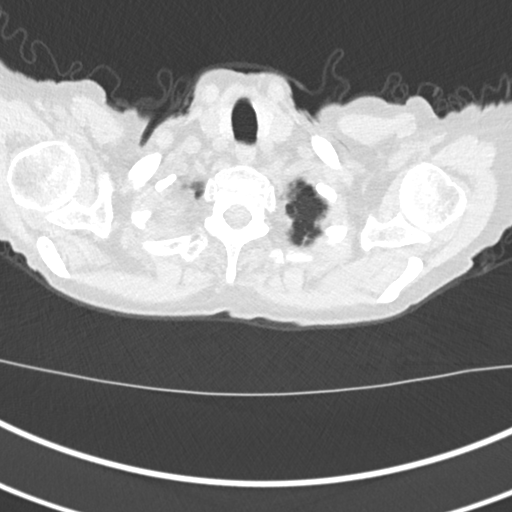

[Series 6: coronal · coronal · 0.60mm/px · 3 of 142 slices shown]
[im 29/142  lung]
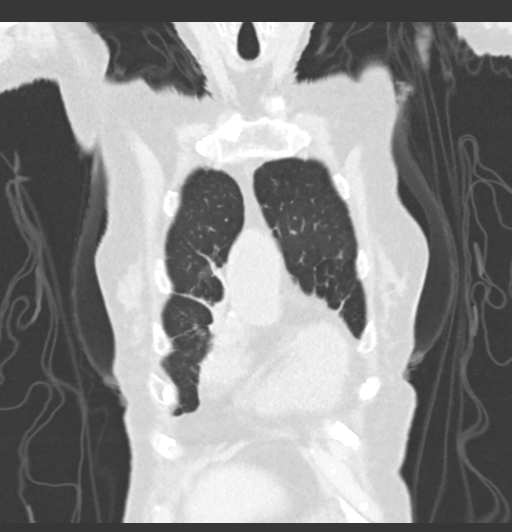
[im 57/142  lung]
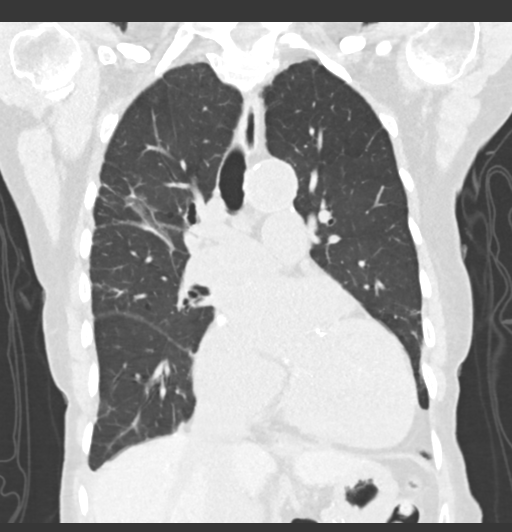
[im 85/142  lung]
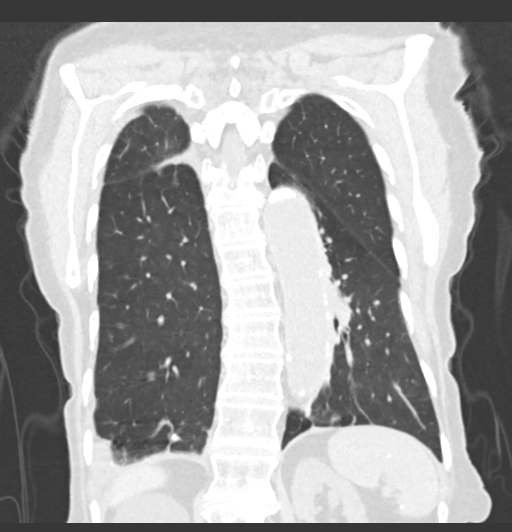

[15 of 36 positions shown; findings below may reference images not displayed]

FINDINGS: Cardiovascular: Cardiac enlargement. No pericardial effusions.
Coronary artery and aortic calcifications. Ascending aortic aneurysm
measuring 4 cm diameter. No change.

Mediastinum/Nodes: Esophagus is decompressed. Scattered lymph nodes
are not pathologically enlarged. Some nodes are calcified, including
left aortopulmonic window nodes.

Lungs/Pleura: Calcified granulomas in the lungs. Small right pleural
effusion. Linear band like densities in the lung bases consistent
with scarring. Persistent focal areas of ground-glass infiltration
in the lungs, most prominent on the right upper lung. Infiltrates
are decreasing in number and density since the previous study.
Bronchiectasis with mucous plugging in the right middle lung.

Upper Abdomen: Calcified granulomas in the liver and spleen. No
acute abnormalities demonstrated in the visualized upper abdomen.

Musculoskeletal: Degenerative changes in the spine. No destructive
bone lesions.
IMPRESSION: 1. Persistent but improving multifocal infiltrates in the lungs.
Persistent small right pleural effusion. Linear scarring in the lung
bases.
2. Cardiac enlargement.
3. 4 cm diameter ascending aortic aneurysm. Recommend annual imaging
followup by CTA or MRA. This recommendation follows 5737
ACCF/AHA/AATS/ACR/ASA/SCA/EISEB/JIM/TALESKI/STUDDARD Guidelines for the
Diagnosis and Management of Patients with Thoracic Aortic Disease.
Circulation. 5737; 121: E266-e369. Aortic aneurysm NOS ([id number]-UL7.2)
4. Bronchiectasis with mucous plugging in the right middle lung.
5. Aortic atherosclerosis.
6. Calcified granulomas.

## 2022-11-30 NOTE — Patient Instructions (Signed)
Visit Information  Thank you for taking time to visit with me today. Please don't hesitate to contact me if I can be of assistance to you.   Following are the goals we discussed today:   Goals Addressed             This Visit's Progress    [facility] care cocrdination services       Interventions Today    Flowsheet Row Most Recent Value  Chronic Disease   Chronic disease during today's visit Other  [sleep, decrease in weight]  General Interventions   General Interventions Discussed/Reviewed General Interventions Discussed, Doctor Visits  Doctor Visits Discussed/Reviewed Doctor Visits Discussed, PCP, Specialist  PCP/Specialist Visits Compliance with follow-up visit  Exercise Interventions   Exercise Discussed/Reviewed Physical Activity  Physical Activity Discussed/Reviewed Physical Activity Discussed  Education Interventions   Education Provided Provided Education  Provided Verbal Education On Nutrition, Medication, Community Resources, Other  Mental Health Interventions   Mental Health Discussed/Reviewed Mental Health Discussed, Coping Strategies  Nutrition Interventions   Nutrition Discussed/Reviewed Nutrition Discussed, Fluid intake, Increasing proteins, Supplemental nutrition  Pharmacy Interventions   Pharmacy Dicussed/Reviewed Pharmacy Topics Discussed, Affording Medications  Safety Interventions   Safety Discussed/Reviewed Safety Discussed              Our next appointment is by telephone on 06/13/22 at 1 pm  Please call the care guide team at [phone] if you need to cancel or reschedule your appointment.   If you are experiencing a Mental Health or Behavioral Health Crisis or need someone to talk to, please call the Suicide and Crisis Lifeline: 988 call the [place] National Suicide Prevention Lifeline: 312-266-6815 or TTY: 450 538 9330 TTY 508-151-2970) to talk to a trained counselor call 1-800-273-TALK (toll free, 24 hour hotline) call the [facility]: 443-086-3696 call 911   No computer access, no preference for copy of AVS     The patient has been provided with contact information for the care management team and has been advised to call with any health related questions or concerns.   Shakeena Kafer L. Noelle Penner, RN, BSN, CCM, Care Management Coordinator [phone]

## 2022-12-03 NOTE — Progress Notes (Signed)
Care Coordination Note  12/03/2022 Name: [name] MRN: [id number] DOB: 20-Jan-1929  [name] is a 89 y.o. year old female who is a primary care patient of Stacks, Broadus John, MD and is actively engaged with the care management team. I reached out to [name] by phone today to assist with re-scheduling a follow up visit with the RN Case Manager  Follow up plan: Telephone appointment with care management team member scheduled for:12/23/22  [facility] Coordination Care Guide  Direct Dial: [phone]

## 2022-12-16 ENCOUNTER — Ambulatory Visit: Payer: Medicare HMO | Admitting: Family Medicine

## 2022-12-19 ENCOUNTER — Other Ambulatory Visit: Payer: Self-pay | Admitting: Family Medicine

## 2022-12-19 NOTE — Telephone Encounter (Signed)
Pts daughter called to make sure we received refill request. Explained to daughter that we did receive request but it looks like pt is due for an appt so the request may be denied since she doesn't have an upcoming appt scheduled. Daughter scheduled upcoming appt for 10/28 (first available).  Can enough medicine be sent in to cover pt until this appt?  Daughter is aware that pt must keep this appt or no other refills may be sent in for pt.

## 2022-12-23 ENCOUNTER — Ambulatory Visit: Payer: Self-pay | Admitting: *Deleted

## 2022-12-23 NOTE — Patient Outreach (Signed)
Care Coordination   12/23/2022 Name: TAIESHA CARE MRN: [id number] DOB: May 24, 1928   Care Coordination Outreach Attempts:  An unsuccessful telephone outreach was attempted today to offer the patient information about available care coordination services.  Follow Up Plan:  Additional outreach attempts will be made to offer the patient care coordination information and services.   Encounter Outcome:  No Answer   Care Coordination Interventions:  No, not indicated    Braun Rocca L. Noelle Penner, RN, BSN, [facility]  VBCI Care Management Coordinator  [phone]  Fax: [phone]

## 2022-12-23 NOTE — Patient Outreach (Signed)
Care Coordination   01/08/2023 update note for 12/23/22 Name: [name] MRN: [id number] DOB: December 16, 1928   Care Coordination Outreach Attempts:  An unsuccessful telephone outreach was attempted today to offer the patient information about available care coordination services.  Follow Up Plan:  Additional outreach attempts will be made to offer the patient care coordination information and services.   Encounter Outcome:  No Answer   Care Coordination Interventions:  No, not indicated    Keaten Mashek L. Noelle Penner, RN, BSN, [facility]  VBCI Care Management Coordinator  [phone]  Fax: [phone]

## 2023-01-05 ENCOUNTER — Ambulatory Visit (INDEPENDENT_AMBULATORY_CARE_PROVIDER_SITE_OTHER): Payer: Medicare HMO | Admitting: Family Medicine

## 2023-01-05 ENCOUNTER — Encounter: Payer: Self-pay | Admitting: Family Medicine

## 2023-01-05 VITALS — BP 166/65 | HR 62 | Temp 98.3°F | Ht 63.0 in | Wt 106.0 lb

## 2023-01-05 DIAGNOSIS — E039 Hypothyroidism, unspecified: Secondary | ICD-10-CM | POA: Diagnosis not present

## 2023-01-05 DIAGNOSIS — Z23 Encounter for immunization: Secondary | ICD-10-CM | POA: Diagnosis not present

## 2023-01-05 DIAGNOSIS — I480 Paroxysmal atrial fibrillation: Secondary | ICD-10-CM | POA: Diagnosis not present

## 2023-01-05 DIAGNOSIS — I10 Essential (primary) hypertension: Secondary | ICD-10-CM

## 2023-01-05 DIAGNOSIS — G3184 Mild cognitive impairment, so stated: Secondary | ICD-10-CM

## 2023-01-05 MED ORDER — VALSARTAN 80 MG PO TABS: 80.0000 mg | ORAL_TABLET | Freq: Every day | ORAL | 3 refills | Status: DC

## 2023-01-05 MED ORDER — FUROSEMIDE 20 MG PO TABS: 20.0000 mg | ORAL_TABLET | Freq: Every day | ORAL | 1 refills | Status: DC

## 2023-01-05 NOTE — Progress Notes (Signed)
Subjective:  Patient ID: [name], female    DOB: [birth year]  Age: 89 y.o. MRN: [id number]  CC: Medical Management of Chronic Issues   HPI SHELBYLYNN ROMBERG presents for  presents for  follow-up of hypertension. Patient has no history of headache chest pain or shortness of breath or recent cough. Patient also denies symptoms of TIA such as focal numbness or weakness. Recently taken off of metoprolol. BP running high now.   Atrial fibrillation follow up. Pt. is treated with rate control and anticoagulation. Pt.  denies palpitations, rapid rate, chest pain, dyspnea and edema. There has been no bleeding from nose or gums. Pt. has not noticed blood with urine or stool.  Although there is routine bruising easily, it is not excessive.  Hx of hypothyroidism. No active sx & off of med.   Admits to being forgetful, but fully cognizant at todays evaluation     01/05/2023    2:03 PM 08/11/2022    3:23 PM 05/28/2022    2:06 PM  Depression screen PHQ 2/9  Decreased Interest 0 0 0  Down, Depressed, Hopeless 0 0 0  PHQ - 2 Score 0 0 0  Altered sleeping 0    Tired, decreased energy 0    Change in appetite 0    Feeling bad or failure about yourself  0    Trouble concentrating 0    Moving slowly or fidgety/restless 1    Suicidal thoughts 0    PHQ-9 Score 1      History Lisseth has a past medical history of Arthritis, Atrial fibrillation (HCC), COVID-19, CVA (cerebral vascular accident) (HCC), HTN (hypertension), MR (mitral regurgitation), NSTEMI (non-ST elevated myocardial infarction) (HCC), Orthostatic hypotension (11/06/2011), and Pericarditis.   She has a past surgical history that includes anterior repair with perigee graft (04/16/06); Anterior and posterior vaginal repair (10/15/04); sacral spinous ligament suspension of vagina; suprapubic cystectomy; Total abdominal hysterectomy w/ bilateral salpingoophorectomy; and Mitral valve replacement.   Her family history includes Colon cancer in her  brother; Heart attack in her father; Heart disease in her brother, father, and mother; Lupus in her father.She reports that she has never smoked. She has never used smokeless tobacco. She reports that she does not drink alcohol and does not use drugs.    ROS Review of Systems  Constitutional: Negative.   HENT: Negative.    Eyes:  Negative for visual disturbance.  Respiratory:  Negative for shortness of breath.   Cardiovascular:  Negative for chest pain.  Gastrointestinal:  Negative for abdominal pain.  Musculoskeletal:  Negative for arthralgias.    Objective:  BP (!) 166/65   Pulse 62   Temp 98.3 F (36.8 C)   Ht 5\' 3"  (1.6 m)   Wt 106 lb (48.1 kg)   SpO2 97%   BMI 18.78 kg/m   BP Readings from Last 3 Encounters:  01/05/23 (!) 166/65  08/15/22 136/72  08/11/22 (!) 162/72    Wt Readings from Last 3 Encounters:  01/05/23 106 lb (48.1 kg)  08/15/22 102 lb 12.8 oz (46.6 kg)  08/11/22 102 lb 12.8 oz (46.6 kg)     Physical Exam Constitutional:      General: She is not in acute distress.    Appearance: She is well-developed.  Cardiovascular:     Rate and Rhythm: Normal rate and regular rhythm.  Pulmonary:     Breath sounds: Normal breath sounds.  Musculoskeletal:        General: Normal range of motion.  Skin:    General: Skin is warm and dry.  Neurological:     Mental Status: She is alert and oriented to person, place, and time.       Assessment & Plan:   Crisol "[name]" was seen today for medical management of chronic issues.  Diagnoses and all orders for this visit:  Essential hypertension -     CBC with Differential/Platelet -     CMP14+EGFR -     TSH + free T4  Hypothyroidism, unspecified type -     CBC with Differential/Platelet -     CMP14+EGFR -     TSH + free T4  Mild cognitive impairment -     CBC with Differential/Platelet -     CMP14+EGFR -     TSH + free T4  Paroxysmal atrial fibrillation (HCC) -     CBC with Differential/Platelet -      CMP14+EGFR -     TSH + free T4  Other orders -     valsartan (DIOVAN) 80 MG tablet; Take 1 tablet (80 mg total) by mouth daily. -     furosemide (LASIX) 20 MG tablet; Take 1 tablet (20 mg total) by mouth daily.       I have discontinued Krissi M. Durall "[name]"'s metoprolol succinate. I have also changed her furosemide. Additionally, I am having her start on valsartan. Lastly, I am having her maintain her Biotin, atorvastatin, cholecalciferol, acetaminophen, amiodarone, eplerenone, and apixaban.  Allergies as of 01/05/2023       Reactions   Donepezil Other (See Comments)   bradycardia   Actonel [risedronate Sodium] Nausea And Vomiting   Aspirin-caffeine Other (See Comments)   Upset stomach   Codeine Other (See Comments)   Upset stomach   Memantine Other (See Comments)   Unknown    Penicillins Other (See Comments)   Unknown    Sulfonamide Derivatives Other (See Comments)   Swollen tongue        Medication List        Accurate as of January 05, 2023  2:25 PM. If you have any questions, ask your nurse or doctor.          STOP taking these medications    metoprolol succinate 25 MG 24 hr tablet Commonly known as: TOPROL-XL Stopped by: Dola Lunsford       TAKE these medications    acetaminophen 325 MG tablet Commonly known as: TYLENOL Take 325 mg by mouth as needed for moderate pain.   amiodarone 200 MG tablet Commonly known as: PACERONE Take 0.5 tablets (100 mg total) by mouth daily. Take one tablet by mouth daily   apixaban 2.5 MG Tabs tablet Commonly known as: ELIQUIS Take 1 tablet (2.5 mg total) by mouth 2 (two) times daily.   atorvastatin 10 MG tablet Commonly known as: LIPITOR Take 1 tablet (10 mg total) by mouth daily.   Biotin 1000 MCG tablet Take 1,000 mcg by mouth daily.   cholecalciferol 25 MCG (1000 UNIT) tablet Commonly known as: VITAMIN D3 Take 1,000 Units by mouth daily.   eplerenone 25 MG tablet Commonly known as: Inspra Take 1  tablet (25 mg total) by mouth daily.   furosemide 20 MG tablet Commonly known as: LASIX Take 1 tablet (20 mg total) by mouth daily.   valsartan 80 MG tablet Commonly known as: DIOVAN Take 1 tablet (80 mg total) by mouth daily. Started by: Broadus John Everlina Gotts         Follow-up: Return in about  6 months (around 07/06/2023).  Mechele Claude, M.D.

## 2023-01-06 LAB — CBC WITH DIFFERENTIAL/PLATELET
Basophils Absolute: 0 10*3/uL (ref 0.0–0.2)
Basos: 1 %
EOS (ABSOLUTE): 0.1 10*3/uL (ref 0.0–0.4)
Eos: 1 %
Hematocrit: 36.7 % (ref 34.0–46.6)
Hemoglobin: 12 g/dL (ref 11.1–15.9)
Immature Grans (Abs): 0 10*3/uL (ref 0.0–0.1)
Immature Granulocytes: 1 %
Lymphocytes Absolute: 1.4 10*3/uL (ref 0.7–3.1)
Lymphs: 22 %
MCH: 31.7 pg (ref 26.6–33.0)
MCHC: 32.7 g/dL (ref 31.5–35.7)
MCV: 97 fL (ref 79–97)
Monocytes Absolute: 0.5 10*3/uL (ref 0.1–0.9)
Monocytes: 8 %
Neutrophils Absolute: 4.3 10*3/uL (ref 1.4–7.0)
Neutrophils: 67 %
Platelets: 208 10*3/uL (ref 150–450)
RBC: 3.78 x10E6/uL (ref 3.77–5.28)
RDW: 12.9 % (ref 11.7–15.4)
WBC: 6.3 10*3/uL (ref 3.4–10.8)

## 2023-01-06 LAB — CMP14+EGFR
ALT: 18 [IU]/L (ref 0–32)
AST: 21 [IU]/L (ref 0–40)
Albumin: 4.2 g/dL (ref 3.6–4.6)
Alkaline Phosphatase: 83 [IU]/L (ref 44–121)
BUN/Creatinine Ratio: 17 (ref 12–28)
BUN: 22 mg/dL (ref 10–36)
Bilirubin Total: 0.4 mg/dL (ref 0.0–1.2)
CO2: 24 mmol/L (ref 20–29)
Calcium: 9.7 mg/dL (ref 8.7–10.3)
Chloride: 105 mmol/L (ref 96–106)
Creatinine, Ser: 1.33 mg/dL — ABNORMAL HIGH (ref 0.57–1.00)
Globulin, Total: 2.3 g/dL (ref 1.5–4.5)
Glucose: 90 mg/dL (ref 70–99)
Potassium: 4.3 mmol/L (ref 3.5–5.2)
Sodium: 144 mmol/L (ref 134–144)
Total Protein: 6.5 g/dL (ref 6.0–8.5)
eGFR: 38 mL/min/{1.73_m2} — ABNORMAL LOW (ref >59)

## 2023-01-06 LAB — TSH+FREE T4
Free T4: 1.19 ng/dL (ref 0.82–1.77)
TSH: 3.15 u[IU]/mL (ref 0.450–4.500)

## 2023-01-08 ENCOUNTER — Ambulatory Visit: Payer: Self-pay | Admitting: *Deleted

## 2023-01-08 NOTE — Patient Outreach (Signed)
Care Coordination   01/08/2023 Name: [name] MRN: [id number] DOB: 24-Feb-1929   Care Coordination Outreach Attempts:  An unsuccessful telephone outreach was attempted today to offer the patient information about available care coordination services.  Follow Up Plan:  Additional outreach attempts will be made to offer the patient care coordination information and services.   Encounter Outcome:  No Answer   Care Coordination Interventions:  No, not indicated    Debi Cousin L. Noelle Penner, RN, BSN, [facility]  VBCI Care Management Coordinator  [phone]  Fax: [phone]

## 2023-01-08 NOTE — Patient Outreach (Signed)
  Care Coordination   Follow Up Visit Note   06/12/2023 updated note for 01/08/23 Name: [name] MRN: [id number] DOB: 1928/09/11  [name] is a 89 y.o. year old female who sees Mechele Claude, MD for primary care. I spoke with daughter, Magaby, Rumberger by phone today.  What matters to the patients health and wellness today?  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 Encouraged to decrease some of her activities Inquired about patient 01/05/23 labs- reviewed  Taking new blood pressure medicine, Diovan      Goals Addressed             This Visit's Progress    patient will remain active and manage her hypertension, improve sleep-[facility] care cocrdination services       Interventions Today    Flowsheet Row Most Recent Value  Chronic Disease   Chronic disease during today's visit Other  [sleep, decrease in weight]  General Interventions   General Interventions Discussed/Reviewed General Interventions Discussed, Doctor Visits  Doctor Visits Discussed/Reviewed Doctor Visits Discussed, PCP, Specialist  PCP/Specialist Visits Compliance with follow-up visit  Exercise Interventions   Exercise Discussed/Reviewed Physical Activity  Physical Activity Discussed/Reviewed Physical Activity Discussed  Education Interventions   Education Provided Provided Education  Provided Verbal Education On Nutrition, Medication, Community Resources, Other  Mental Health Interventions   Mental Health Discussed/Reviewed Mental Health Discussed, Coping Strategies  Nutrition Interventions   Nutrition Discussed/Reviewed Nutrition Discussed, Fluid intake, Increasing proteins, Supplemental nutrition  Pharmacy Interventions   Pharmacy Dicussed/Reviewed Pharmacy Topics Discussed, Affording Medications  Safety Interventions   Safety Discussed/Reviewed Safety Discussed              SDOH  assessments and interventions completed:  No     Care Coordination Interventions:  Yes, provided   Follow up plan: No further intervention required.   Encounter Outcome:  Patient Visit Completed   Cala Bradford L. Noelle Penner, RN, BSN, [facility]  VBCI Care Management Coordinator  [phone]  Fax: [phone]

## 2023-01-08 NOTE — Patient Instructions (Signed)
 Visit Information  Thank you for taking time to visit with me today. Please don't hesitate to contact me if I can be of assistance to you.   Following are the goals we discussed today:   Goals Addressed   None     Our next appointment is no further scheduled appointments.  on as  needed at as needed  Please call the care guide team at [phone] if you need to cancel or reschedule your appointment.   If you are experiencing a Mental Health or Behavioral Health Crisis or need someone to talk to, please call the Suicide and Crisis Lifeline: 988 call the [place] National Suicide Prevention Lifeline: 234-374-2642 or TTY: (614)615-0839 TTY 325-205-2821) to talk to a trained counselor call 1-800-273-TALK (toll free, 24 hour hotline) call the [facility]: 816-567-4315 call 911   The patient verbalized understanding of instructions, educational materials, and care plan provided today and DECLINED offer to receive copy of patient instructions, educational materials, and care plan.   The patient has been provided with contact information for the care management team and has been advised to call with any health related questions or concerns.    Serigne Kubicek L. Noelle Penner, RN, BSN, CCM [facility] RN Care Manager [phone]

## 2023-01-13 ENCOUNTER — Other Ambulatory Visit: Payer: Self-pay | Admitting: Cardiovascular Disease

## 2023-01-26 DIAGNOSIS — D225 Melanocytic nevi of trunk: Secondary | ICD-10-CM | POA: Diagnosis not present

## 2023-01-26 DIAGNOSIS — C44319 Basal cell carcinoma of skin of other parts of face: Secondary | ICD-10-CM | POA: Diagnosis not present

## 2023-01-26 DIAGNOSIS — L821 Other seborrheic keratosis: Secondary | ICD-10-CM | POA: Diagnosis not present

## 2023-01-26 DIAGNOSIS — D492 Neoplasm of unspecified behavior of bone, soft tissue, and skin: Secondary | ICD-10-CM | POA: Diagnosis not present

## 2023-01-26 DIAGNOSIS — L814 Other melanin hyperpigmentation: Secondary | ICD-10-CM | POA: Diagnosis not present

## 2023-01-26 DIAGNOSIS — L570 Actinic keratosis: Secondary | ICD-10-CM | POA: Diagnosis not present

## 2023-01-28 ENCOUNTER — Other Ambulatory Visit: Payer: Self-pay | Admitting: Cardiovascular Disease

## 2023-02-18 ENCOUNTER — Encounter: Payer: Self-pay | Admitting: Family Medicine

## 2023-02-18 ENCOUNTER — Ambulatory Visit: Payer: Medicare HMO | Admitting: Family Medicine

## 2023-02-18 VITALS — BP 159/69 | HR 66 | Temp 97.4°F | Ht 63.0 in | Wt 104.8 lb

## 2023-02-18 DIAGNOSIS — M545 Low back pain, unspecified: Secondary | ICD-10-CM

## 2023-02-18 MED ORDER — BETAMETHASONE SOD PHOS & ACET 6 (3-3) MG/ML IJ SUSP
6.0000 mg | Freq: Once | INTRAMUSCULAR | Status: AC
  Administered 2023-02-18: 6 mg via INTRAMUSCULAR

## 2023-02-18 NOTE — Progress Notes (Signed)
Subjective:  Patient ID: [name], female    DOB: 1928/04/24  Age: 89 y.o. MRN: [id number]  CC: Back Pain (Left sided lower back pain)   HPI [name] presents for left lumbar radiating to hip. NKI. Also pain in anterior thigh to knee at times.      02/18/2023    1:07 PM 01/05/2023    2:03 PM 08/11/2022    3:23 PM  Depression screen PHQ 2/9  Decreased Interest 0 0 0  Down, Depressed, Hopeless 0 0 0  PHQ - 2 Score 0 0 0  Altered sleeping  0   Tired, decreased energy  0   Change in appetite  0   Feeling bad or failure about yourself   0   Trouble concentrating  0   Moving slowly or fidgety/restless  1   Suicidal thoughts  0   PHQ-9 Score  1     History [name] has a past medical history of Arthritis, Atrial fibrillation (HCC), COVID-19, CVA (cerebral vascular accident) (HCC), HTN (hypertension), MR (mitral regurgitation), NSTEMI (non-ST elevated myocardial infarction) (HCC),  Orthostatic hypotension (11/06/2011), and Pericarditis.   She has a past surgical history that includes anterior repair with perigee graft (04/16/06); Anterior and posterior vaginal repair (10/15/04); sacral  spinous ligament suspension of vagina; suprapubic cystectomy; Total abdominal hysterectomy w/ bilateral salpingoophorectomy; and Mitral valve replacement.   Her family history includes Colon cancer in her brother; Heart attack in her father; Heart disease in her brother, father, and mother; Lupus in her father.She reports that she has never smoked. She has never used smokeless tobacco. She reports that she does not drink alcohol and does not use drugs.    ROS Review of Systems  Constitutional:  Positive for activity change (due to pain. Still active though).  HENT: Negative.    Eyes:  Negative for visual disturbance.  Respiratory:  Negative for shortness of breath.   Cardiovascular:  Negative for chest pain.  Gastrointestinal:  Negative for abdominal pain.  Musculoskeletal:  Negative for arthralgias.    Objective:  BP (!) 159/69   Pulse 66   Temp (!) 97.4 F (36.3 C)   Ht 5\' 3"  (1.6 m)   Wt 104 lb 12.8 oz (47.5 kg)   SpO2 95%   BMI 18.56 kg/m   BP Readings from Last 3 Encounters:  02/18/23 (!) 159/69  01/05/23 (!) 166/65  08/15/22 136/72    Wt Readings from Last 3 Encounters:  02/18/23 104 lb 12.8 oz (47.5 kg)  01/05/23 106 lb (48.1 kg)  08/15/22 102 lb 12.8 oz (46.6 kg)     Physical Exam Musculoskeletal:        General: Tenderness (left lumbar musculature and laterally at lessertrochanter. possibly at the bursa) present.       Assessment & Plan:   Sharon "[name]" was seen today for back pain.  Diagnoses and all orders for this visit:  Lumbar pain       I am having [name] "[name]" maintain her Biotin, cholecalciferol, acetaminophen, eplerenone, apixaban, valsartan, furosemide, atorvastatin, and amiodarone.  Allergies as of 02/18/2023       Reactions   Donepezil Other (See Comments)   bradycardia   Actonel [risedronate Sodium] Nausea And Vomiting   Aspirin-caffeine Other (See Comments)   Upset stomach   Codeine Other (See Comments)   Upset  stomach   Memantine Other (See Comments)   Unknown    Penicillins Other (See Comments)   Unknown    Sulfonamide Derivatives Other (See Comments)   Swollen tongue        Medication List        Accurate as of February 18, 2023  1:59 PM. If you have any questions, ask your nurse or doctor.          acetaminophen 325 MG tablet Commonly known as: TYLENOL Take 325 mg by mouth as needed for moderate pain.   amiodarone 200 MG tablet Commonly known as: PACERONE Take 1 tablet (200 mg total) by mouth daily.   apixaban 2.5 MG Tabs tablet Commonly known as: ELIQUIS Take 1 tablet (2.5 mg total) by mouth 2 (two) times daily.   atorvastatin 10 MG tablet Commonly known as: LIPITOR Take 1 tablet by mouth once daily   Biotin 1000 MCG tablet Take 1,000 mcg by mouth daily.   cholecalciferol 25 MCG (1000 UNIT) tablet Commonly known as: VITAMIN D3 Take 1,000 Units by mouth daily.   eplerenone 25 MG tablet Commonly known as: Inspra Take 1 tablet (25 mg total) by mouth daily.   furosemide 20 MG tablet Commonly known as: LASIX  Take 1 tablet (20 mg total) by mouth daily.   valsartan 80 MG tablet Commonly known as: DIOVAN Take 1 tablet (80 mg total) by mouth daily.         Follow-up: No follow-ups on file.  Mechele Claude, M.D.

## 2023-02-19 DIAGNOSIS — M79676 Pain in unspecified toe(s): Secondary | ICD-10-CM | POA: Diagnosis not present

## 2023-02-19 DIAGNOSIS — L84 Corns and callosities: Secondary | ICD-10-CM | POA: Diagnosis not present

## 2023-02-19 DIAGNOSIS — B351 Tinea unguium: Secondary | ICD-10-CM | POA: Diagnosis not present

## 2023-02-19 DIAGNOSIS — I70203 Unspecified atherosclerosis of native arteries of extremities, bilateral legs: Secondary | ICD-10-CM | POA: Diagnosis not present

## 2023-02-26 ENCOUNTER — Other Ambulatory Visit: Payer: Self-pay | Admitting: Cardiovascular Disease

## 2023-02-26 DIAGNOSIS — I480 Paroxysmal atrial fibrillation: Secondary | ICD-10-CM

## 2023-02-26 NOTE — Telephone Encounter (Signed)
Prescription refill request for Eliquis received. Indication: Afib  Last office visit: 08/15/22 Eden Emms)  Scr: 1.33 (01/05/23)  Age: 89 Weight: 47.5kg  Appropriate dose. Refill sent.

## 2023-02-26 NOTE — Telephone Encounter (Signed)
Prescription refill request for Eliquis received. Indication: PAF Last office visit: 08/15/22  Burna Forts MD Scr: 1.33 on 01/05/23  Epic Age: 89 Weight: 46.6kg  Based on above findings Eliquis 2.5mg  twice daily is the appropriate dose.  Refill approved.

## 2023-03-02 ENCOUNTER — Ambulatory Visit: Payer: Medicare HMO | Admitting: Cardiovascular Disease

## 2023-03-07 NOTE — Progress Notes (Signed)
 Patient ID: [name], female   DOB: 12-12-1928, 89 y.o.   MRN: [id number]      89 y.o. f/u post MV repair TTE 01/12/19 showed trace MR with low diastolic gradients and normal EF 60%.  She has HTN on ARB More dizziness on 50 mg Cozaar  dose decreased. She thinks statin Makes her feel worse and now taking Qod.  Some LE varicosities   Depression continues. Husband [name] was a patient of mine and died of lung cancer 6 years ago They were married 60 years. Shortly after that [name] died tragically in there back yard of a chain saw accident. Son [name] living with her and two daughters near by.   Has had some memory issues and has been on namenda  and aricept   Hospitalized 09/27/20 with COVID causing afib and new DCM with TTE showing EF 30-35% MV repair fine with trivial MR no MS mean gradient 4 mmhg  Converted with amiodarone  to slow sinus and aricept  d/c Added Toprol , ARB and eliquis  2.5 bid  TTE 01/10/21 EF improved 35-40%   December 2022 more dyspnea Primary ? Sinusitis and started her on Augmentin  Cough with sputum rhiorrhea and sore throat with cough and confusion from Namenda /Aricept    Called office again 11/25/20 complaining of dyspnea and more rapid HR;s  She had CT 04/17/21 showing bronchiectasis with mucous plugging in RML and multifocal infiltrates Seen by pulmonary Dr Jude 05/15/21 thought to be improving but appears to have bronchiectasis and MAI  11/28/21:  in rapid fib/flutter She clearly gets dyspnea and fatigue with minimum walking She was started on amiodarone  and converted when seen in f/u at Afib clinic on 01/08/22   05/17/22-05/20/22 Admitted with CHF and afib diuresed and rate control with iv digoxin  and lopressor  changed to coreg  Sent home on lasix  20 mg daily and amiodarone  200 mg Readmitted 3/17 with azotemia and Cr 3.3 Benicar  d/c and sent home on lasix  20 mg daily again BNP elevated 07/03/22 elevated 1327 Inspra  added 06/04/22   Primary wanted to stop diuretic and start beta  blocker again Not indicated as she has had bradycardia and still needs diuretic for CHF and elevated BNP Appears that Dr Zollie started her on Diovan  80 mg 01/05/23 Labs at that time with Cr 1.33 and K 4.3   She has left hip pain since last October. Had steroid injection recently that did not help Was inquiring about  Xray to make sure nothing other than arthritis going on No fall/ trauma   BP (!) 150/78 (BP Location: Right Arm, Patient Position: Sitting, Cuff Size: Normal)   Pulse 79   Resp 16   Ht 5' 3 (1.6 m)   Wt 103 lb 12.8 oz (47.1 kg)   SpO2 95%   BMI 18.39 kg/m   Affect appropriate Healthy:  appears stated age HEENT: normal Neck supple with no adenopathy JVP normal no bruits no thyromegaly Lungs clear with no wheezing and good diaphragmatic motion Heart:  S1/S2 no murmur, no rub, gallop or click PMI normal  Post sternotomy  Abdomen: benighn, BS positve, no tenderness, no AAA no bruit.  No HSM or HJR Distal pulses intact with no bruits No edema Neuro non-focal Skin warm and dry No muscular weakness   Current Outpatient Medications  Medication Sig Dispense Refill   acetaminophen  (TYLENOL ) 325 MG tablet Take 325 mg by mouth as needed for moderate pain.     amiodarone  (PACERONE ) 200 MG tablet Take 1 tablet (200 mg total) by mouth daily.  90 tablet 2   apixaban  (ELIQUIS ) 2.5 MG TABS tablet Take 1 tablet by mouth twice daily 180 tablet 1   atorvastatin  (LIPITOR) 10 MG tablet Take 1 tablet by mouth once daily 90 tablet 2   Biotin 1000 MCG tablet Take 1,000 mcg by mouth daily.     cholecalciferol 25 MCG (1000 UT) tablet Take 1,000 Units by mouth daily.     eplerenone  (INSPRA ) 25 MG tablet Take 1 tablet (25 mg total) by mouth daily. 90 tablet 3   furosemide  (LASIX ) 20 MG tablet Take 1 tablet (20 mg total) by mouth daily. 90 tablet 1   valsartan  (DIOVAN ) 80 MG tablet Take 1 tablet (80 mg total) by mouth daily. 90 tablet 3   No current facility-administered medications for  this visit.    Allergies  Donepezil , Actonel  [risedronate  sodium], Aspirin -caffeine, Codeine , Memantine , Penicillins, and Sulfonamide derivatives  Electrocardiogram:   12/02/17  SR rate 62 LAD RBBB 03/18/2023 SR rate 58 RBBB/LAD  03/18/2023 afib/flutter rate 122 RBBB LAD   Assessment and Plan  MV Repair:  SBE prophylaxis discussed  Echo 05/18/22 repair intact mild MR    HTN: Primary added diovan  01/05/23 has had azotemia with ARB in past update BMET/BNP   Thyroid :  On replacement TSH  3.15 normal 01/05/23   PAF: continue low dose eliquis  given age weight 45 kg and elevated recent Cr with azotemia Continue amiodarone  Beta blocker d/c due to sinus pauses   DCM:  EF stable TTE 05/18/22 30-35% beta blocker d/c due to SSS and pauses ARB d/c due to azotemia and markedly increase Cr Continue current dose of lasix  and Inspra  01/05/23 Cr 1.33 K 4.3   Memory: Aricept  d/c in hospital due to bradycardia Sent Dr Zollie a message never to restart and she appears to be having side effects from Namenda  also d/c   HLD:  continue liptor   2 view hip on left ? arthritis  F/U in 6 months   [name]

## 2023-03-18 ENCOUNTER — Ambulatory Visit: Payer: PPO | Attending: Cardiovascular Disease | Admitting: Cardiovascular Disease

## 2023-03-18 ENCOUNTER — Encounter: Payer: Self-pay | Admitting: Cardiovascular Disease

## 2023-03-18 VITALS — BP 150/78 | HR 79 | Resp 16 | Ht 63.0 in | Wt 103.8 lb

## 2023-03-18 DIAGNOSIS — I480 Paroxysmal atrial fibrillation: Secondary | ICD-10-CM | POA: Diagnosis not present

## 2023-03-18 DIAGNOSIS — M25552 Pain in left hip: Secondary | ICD-10-CM | POA: Diagnosis not present

## 2023-03-18 DIAGNOSIS — R0609 Other forms of dyspnea: Secondary | ICD-10-CM | POA: Diagnosis not present

## 2023-03-18 NOTE — Patient Instructions (Addendum)
 Medication Instructions:  Your physician recommends that you continue on your current medications as directed. Please refer to the Current Medication list given to you today.  *If you need a refill on your cardiac medications before your next appointment, please call your pharmacy*   Lab Work: If you have labs (blood work) drawn today and your tests are completely normal, you will receive your results only by: MyChart Message (if you have MyChart) OR A paper copy in the mail If you have any lab test that is abnormal or we need to change your treatment, we will call you to review the results.   Testing/Procedures: 2 View Hip XRay   Follow-Up: At [facility], you and your health needs are our priority.  As part of our continuing mission to provide you with exceptional heart care, we have created designated Provider Care Teams.  These Care Teams include your primary Cardiologist (physician) and Advanced Practice Providers (APPs -  Physician Assistants and Nurse Practitioners) who all work together to provide you with the care you need, when you need it.  We recommend signing up for the patient portal called MyChart.  Sign up information is provided on this After Visit Summary.  MyChart is used to connect with patients for Virtual Visits (Telemedicine).  Patients are able to view lab/test results, encounter notes, upcoming appointments, etc.  Non-urgent messages can be sent to your provider as well.   To learn more about what you can do with MyChart, go to [email or web address].    Your next appointment:   6 month(s)  Provider:   [name], MD

## 2023-03-23 ENCOUNTER — Ambulatory Visit (HOSPITAL_COMMUNITY)
Admission: RE | Admit: 2023-03-23 | Discharge: 2023-03-23 | Disposition: A | Discharge status: Home / Self Care | Payer: PPO | Source: Ambulatory Visit | Attending: Cardiovascular Disease | Admitting: Cardiovascular Disease

## 2023-03-23 DIAGNOSIS — R0609 Other forms of dyspnea: Secondary | ICD-10-CM | POA: Diagnosis present

## 2023-03-23 DIAGNOSIS — I480 Paroxysmal atrial fibrillation: Secondary | ICD-10-CM | POA: Diagnosis present

## 2023-03-23 DIAGNOSIS — M25552 Pain in left hip: Secondary | ICD-10-CM | POA: Insufficient documentation

## 2023-03-23 NOTE — Progress Notes (Signed)
 Acute Office Visit  Subjective:     Patient ID: [name], female    DOB: 09-21-1928, 89 y.o.   MRN: [id number]  Chief Complaint  Patient presents with   Hip Pain    Left hip pain for few months, got xray done yesterday   HPI [name] is a 89 year old female present with her daughter for an acute visit concern for left hip pain. Saw PCP 02/18/2023 for lumbar pain that radiates to left hip and was treated with Prednisone  injection. Hip Pain: Patient complains of left hip pain. Onset of the symptoms was about a month ago. Inciting event: none. Current symptoms include is worse with weight bearing. Associated symptoms: knee giving out. Aggravating symptoms: walking. Patient's course of pain: gradually worsening. Patient has had prior hip problems. Reports 9/10 shark aching that radiates to left kneePrevious visits for this problem: yes, last seen 3 weeks ago by the patient's PCP. Evaluation to date:  she was seen by her cardiologist last week and her ordered x-ray, results is still pending .  Treatment to date: OTC analgesics, which have been temporarily effective and prednisone  injection  Hypertension: She has a dx of HTN current being managed with Valsartan  80 mg daily and Lasix  20 mg. Last 3 systolic BP were elevated. She is asymptomatic. Unsure wether or not it is associated with the hip ain as she reports 9/10 pain left hip. Client's daughter wants to wait to see PCP. BP Readings from Last 3 Encounters:  03/24/23 (!) 150/67  03/18/23 (!) 150/78  02/18/23 (!) 159/69   Wt Readings from Last 3 Encounters:  03/18/23 103 lb 12.8 oz (47.1 kg)  02/18/23 104 lb 12.8 oz (47.5 kg)  01/05/23 106 lb (48.1 kg)     She reports excellent compliance with treatment. She is not having side effects.  She is following a Regular diet. She is not exercising. She does not smoke. Use of agents associated with hypertension: none.  Outside blood pressures are not being monitored Symptoms: No chest pain  No chest pressure  No palpitations No syncope  No dyspnea No orthopnea  No paroxysmal nocturnal dyspnea No lower extremity edema   Pertinent labs Lab Results  Component Value Date   CHOL 169 01/07/2022   HDL 80 01/07/2022   LDLCALC 74 01/07/2022   TRIG 84 01/07/2022   CHOLHDL 2.1 01/07/2022   Lab Results  Component Value Date   NA 144 01/05/2023   K 4.3 01/05/2023   CREATININE 1.33 (H) 01/05/2023   EGFR 38 (L) 01/05/2023   GLUCOSE 90 01/05/2023   TSH 3.150 01/05/2023     The ASCVD Risk score (Arnett DK, et al., 2019) failed to calculate for the following reasons:   The 2019 ASCVD risk score is only valid for ages 52 to 41   Risk score cannot be calculated because patient has a medical history suggesting prior/existing ASCVD    Active Ambulatory Problems    Diagnosis Date Noted   Essential hypertension 08/26/2008   PULMONARY HYPERTENSION, MILD 09/26/2009   DIVERTICULOSIS, COLON 09/26/2009   TRANSIENT ISCHEMIC ATTACKS, HX OF 09/26/2009   History of colonic polyps 09/26/2009   ISCHEMIC COLITIS, HX OF 09/26/2009   Mitral valve failure 10/09/2010   RBBB 10/12/2013   Hypothyroidism 01/06/2017   Iron deficiency anemia due to chronic blood loss 01/12/2017   Paroxysmal atrial fibrillation (HCC) 04/09/2020   Cardiomyopathy (HCC) 04/09/2020   Senile dementia without behavioral disturbance (HCC) 07/09/2020   Fever    Mild  cognitive impairment 02/04/2021   Ascending aortic aneurysm (HCC) 05/15/2021   Chronic HFrEF (heart failure with reduced ejection fraction) (HCC) 05/17/2022   CHF (congestive heart failure) (HCC) 05/17/2022   CKD (chronic kidney disease) 05/19/2022   Acute kidney injury superimposed on chronic kidney disease stage IIIa (HCC) 05/23/2022   Hyperlipidemia 05/23/2022   Left hip pain 03/24/2023   Resolved Ambulatory Problems    Diagnosis Date Noted   Weakness 11/06/2011   Orthostatic hypotension 11/06/2011   Varicose veins 10/12/2013   NSTEMI (non-ST  elevated myocardial infarction) (HCC) 04/09/2020   TIA (transient ischemic attack) 08/08/2020   Atrial fibrillation with RVR (HCC) 09/27/2020   Palpitations    Paroxysmal A-fib (HCC) 09/28/2020   Dyspnea 03/05/2021   Multifocal pneumonia 03/05/2021   Near syncope 04/05/2020   Past Medical History:  Diagnosis Date   Arthritis    Atrial fibrillation (HCC)    COVID-19    CVA (cerebral vascular accident) (HCC)    HTN (hypertension)    MR (mitral regurgitation)    Pericarditis     Review of Systems  Constitutional:  Negative for chills and fever.  Cardiovascular:  Negative for chest pain and leg swelling.  Gastrointestinal:  Negative for constipation, diarrhea, nausea and vomiting.  Genitourinary:  Negative for flank pain and frequency.  Musculoskeletal:  Positive for joint pain.       Left hip not relieve with OTC NSAIDs  Skin:  Negative for itching and rash.  Neurological:  Negative for dizziness and headaches.   Negative unless indicated in HPI    Objective:    BP (!) 150/67   Pulse 79   Temp 97.6 F (36.4 C) (Temporal)   Ht 5' 3 (1.6 m)   SpO2 97%   BMI 18.39 kg/m  BP Readings from Last 3 Encounters:  03/24/23 (!) 150/67  03/18/23 (!) 150/78  02/18/23 (!) 159/69   Wt Readings from Last 3 Encounters:  03/18/23 103 lb 12.8 oz (47.1 kg)  02/18/23 104 lb 12.8 oz (47.5 kg)  01/05/23 106 lb (48.1 kg)      Physical Exam Vitals and nursing note reviewed.  Constitutional:      Appearance: She is underweight.  HENT:     Head: Normocephalic and atraumatic.     Nose: Nose normal.     Mouth/Throat:     Mouth: Mucous membranes are moist.  Eyes:     General: No scleral icterus.    Extraocular Movements: Extraocular movements intact.     Pupils: Pupils are equal, round, and reactive to light.  Cardiovascular:     Rate and Rhythm: Normal rate and regular rhythm.  Pulmonary:     Effort: Pulmonary effort is normal.     Breath sounds: Normal breath sounds.   Musculoskeletal:     Right hip: Normal.     Left hip: Tenderness present. No lacerations or crepitus. Decreased range of motion.  Skin:    General: Skin is warm and dry.     Findings: No rash.  Neurological:     Mental Status: She is alert and oriented to person, place, and time.     Gait: Gait abnormal.     Comments: Use a cane slow unsteady gait     No results found for any visits on 03/24/23.      Assessment & Plan:  Left hip pain -     methylPREDNISolone  Acetate -     Lidocaine ; Place 1 patch onto the skin daily. Remove & Discard patch  within 12 hours or as directed by MD  Dispense: 30 patch; Refill: 0  Other orders -     Arthrocentesis  Derricka 89 year old Caucasian female seen today for left hip pain, no acute distress Left Hip: had x-ray done 03/23/2023, awaiting final radiologist report; Lidocaine  patch q24 hrs Continue all prescribed medication Joint Injection/Arthrocentesis  Date/Time: 03/24/2023 7:50 PM  Performed by: Deitra Morton Sebastian Nena, NP Authorized by: Deitra Morton Sebastian Nena, NP  Indications: pain (left hip pain)  Body area: hip Joint: left hip Local anesthesia used: no  Anesthesia: Local anesthesia used: no  Sedation: Patient sedated: no  Needle size: 22 G Ultrasound guidance: no Approach: lateral Aspirate: clear Methylprednisolone  amount (mg): 40 mg. Lidocaine  1% amount (ml): 1ml. Patient tolerance: patient tolerated the procedure well with no immediate complications Comments: She tolerated the procedures well    HTN: DASH diet, monitor BP at home BID, BP log sheet provided and to bring it to next f/u appointment with PCP  Encourage healthy lifestyle choices, including diet (rich in fruits, vegetables, and lean proteins, and low in salt and simple carbohydrates) and exercise (at least 30 minutes of moderate physical activity daily).     The above assessment and management plan was discussed with the patient. The patient verbalized  understanding of and has agreed to the management plan. Patient is aware to call the clinic if they develop any new symptoms or if symptoms persist or worsen. Patient is aware when to return to the clinic for a follow-up visit. Patient educated on when it is appropriate to go to the emergency department.  Return if symptoms worsen or fail to improve.  Jhase Creppel [place] Thompson, DNP [facility] [address] [place], [place] [address] [phone]  Note: This document was prepared by [employer] voice dictation technology and any errors that results from this process are unintentional.

## 2023-03-24 ENCOUNTER — Ambulatory Visit: Payer: PPO | Admitting: Nurse Practitioner

## 2023-03-24 ENCOUNTER — Encounter: Payer: Self-pay | Admitting: Nurse Practitioner

## 2023-03-24 VITALS — BP 150/67 | HR 79 | Temp 97.6°F | Ht 63.0 in

## 2023-03-24 DIAGNOSIS — M25552 Pain in left hip: Secondary | ICD-10-CM | POA: Insufficient documentation

## 2023-03-24 MED ORDER — LIDOCAINE 5 % EX PTCH: 1.0000 | MEDICATED_PATCH | CUTANEOUS | 0 refills | Status: DC

## 2023-03-24 MED ORDER — METHYLPREDNISOLONE ACETATE 40 MG/ML IJ SUSP
40.0000 mg | Freq: Once | INTRAMUSCULAR | Status: AC
  Administered 2023-03-24: 40 mg via INTRAMUSCULAR

## 2023-03-25 ENCOUNTER — Telehealth: Payer: Self-pay | Admitting: Cardiovascular Disease

## 2023-03-25 NOTE — Telephone Encounter (Signed)
 Pt's daughter is requesting a callback regarding xray results. Please advise

## 2023-03-25 NOTE — Telephone Encounter (Signed)
 Spoke with pt's daughter per DPR on file and stated that the xray results have been been sent yet but as soon as they are resulted, someone will call and route images to PCP. Pt's daughter verbalized understanding and had no further questions at this time.

## 2023-03-27 ENCOUNTER — Ambulatory Visit (INDEPENDENT_AMBULATORY_CARE_PROVIDER_SITE_OTHER): Payer: PPO | Admitting: Family Medicine

## 2023-03-27 ENCOUNTER — Encounter: Payer: Self-pay | Admitting: Family Medicine

## 2023-03-27 ENCOUNTER — Ambulatory Visit: Payer: Self-pay | Admitting: Family Medicine

## 2023-03-27 VITALS — BP 138/66 | HR 69 | Temp 98.0°F | Ht 63.0 in | Wt 103.0 lb

## 2023-03-27 DIAGNOSIS — M25552 Pain in left hip: Secondary | ICD-10-CM

## 2023-03-27 MED ORDER — PREDNISONE 20 MG PO TABS: 40.0000 mg | ORAL_TABLET | Freq: Every day | ORAL | 0 refills | Status: DC

## 2023-03-27 NOTE — Progress Notes (Signed)
Subjective:  Patient ID: [name], female    DOB: 08-30-1928, 89 y.o.   MRN: [id number]  Patient Care Team: Mechele Claude, MD as PCP - General (Family Medicine) Wendall Stade, MD as PCP - Cardiology (Cardiology) Wendall Stade, MD as Consulting Physician (Cardiology) Micki Riley, MD as Consulting Physician (Neurology) Oretha Milch, MD as Consulting Physician (Pulmonary Disease) Glennis Brink, MD as Referring Physician (Dermatology) Clinton Gallant, RN as [facility] Care Management   Chief Complaint:  Hip Pain  HPI: [name] is a 89 y.o. female presenting on 03/27/2023 for Hip Pain States that hip pain started about 4 weeks ago. Has gotten worse the last week. Yesterday and today have been the worst. Rates 9/10 pain. States that it is wrapping around her back and radiating down to her knee. States that it is dull pain. Has been trying lidocaine patches, cream and tylenol.  States that she has not fallen. Reports that prior to Christmas she received new furniture and she was moving a lot of furniture in her house before pain started. Denies fever. Denies recent infection. She was seen on 02/18/23 by PCP for lumbar pain. She was then seen at Cardiology 03/18/23 for follow up, he ordered an xray for possible arthritis. She then was seen again in office on 03/24/23 by Dois Davenport. She received steroid injection at that time. States that this has not improved her symptoms. Radiology read is not complete.   Relevant past medical, surgical, family, and social history reviewed and updated as indicated.  Allergies and medications reviewed and updated. Data reviewed: Chart in Epic.   Past Medical History:  Diagnosis Date   Arthritis    Atrial fibrillation (HCC)    postoperative   COVID-19    CVA (cerebral vascular accident) (HCC)    HTN (hypertension)    MR (mitral regurgitation)    severe   NSTEMI (non-ST elevated myocardial infarction) (HCC)    Orthostatic  hypotension 11/06/2011   Pericarditis    postoperative    Past Surgical History:  Procedure Laterality Date   ANTERIOR AND POSTERIOR VAGINAL REPAIR  10/15/04   Dr. Jennette Kettle    anterior repair with perigee graft  04/16/06   posterior repair with apogee graft; lynx mid urethral sling; sacrospinous ligamnet suspension and cystoscopy; Dr. Lovina Reach   MITRAL VALVE REPLACEMENT     sacral spinous ligament suspension of vagina     suprapubic cystectomy     TOTAL ABDOMINAL HYSTERECTOMY W/ BILATERAL SALPINGOOPHORECTOMY      Social History   Socioeconomic History   Marital status: Widowed    Spouse name: Not on file   Number of children: 3   Years of education: Not on file   Highest education level: 9th grade  Occupational History   Occupation: Retired  Tobacco Use   Smoking status: Never   Smokeless tobacco: Never  Vaping Use   Vaping status: Never Used  Substance and Sexual Activity   Alcohol use: No   Drug use: No   Sexual activity: Not on file  Other Topics Concern   Not on file  Social History Narrative   Lives with son -  2 daughters live nearby and visit frequently, they help with grocery shopping, etc   Right Handed    Drinks 2-3  cups caffeine daily   Social Drivers of Health   Financial Resource Strain: Low Risk  (05/26/2022)   Overall Financial Resource Strain (CARDIA)  Difficulty of Paying Living Expenses: Not very hard  Food Insecurity: No Food Insecurity (05/27/2022)   Hunger Vital Sign    Worried About Running Out of Food in the Last Year: Never true    Ran Out of Food in the Last Year: Never true  Transportation Needs: No Transportation Needs (05/27/2022)   PRAPARE - Administrator, Civil Service (Medical): No    Lack of Transportation (Non-Medical): No  Physical Activity: Insufficiently Active (05/12/2022)   Exercise Vital Sign    Days of Exercise per Week: 3 days    Minutes of Exercise per Session: 30 min  Stress: No Stress Concern Present (05/12/2022)    [employer] of Occupational Health - Occupational Stress Questionnaire    Feeling of Stress : Only a little  Social Connections: Moderately Integrated (05/26/2022)   Social Connection and Isolation Panel [NHANES]    Frequency of Communication with Friends and Family: More than three times a week    Frequency of Social Gatherings with Friends and Family: More than three times a week    Attends Religious Services: More than 4 times per year    Active Member of [employer] or Organizations: Yes    Attends Banker Meetings: More than 4 times per year    Marital Status: Widowed  Intimate Partner Violence: Not At Risk (05/26/2022)   Humiliation, Afraid, Rape, and Kick questionnaire    Fear of Current or Ex-Partner: No    Emotionally Abused: No    Physically Abused: No    Sexually Abused: No    Outpatient Encounter Medications as of 03/27/2023  Medication Sig   acetaminophen (TYLENOL) 325 MG tablet Take 325 mg by mouth as needed for moderate pain.   amiodarone (PACERONE) 200 MG tablet Take 1 tablet (200 mg total) by mouth daily.   apixaban (ELIQUIS) 2.5 MG TABS tablet Take 1 tablet by mouth twice daily   atorvastatin (LIPITOR) 10 MG tablet Take 1 tablet by mouth once daily   Biotin 1000 MCG tablet Take 1,000 mcg by mouth daily.   cholecalciferol 25 MCG (1000 UT) tablet Take 1,000 Units by mouth daily.   eplerenone (INSPRA) 25 MG tablet Take 1 tablet (25 mg total) by mouth daily.   furosemide (LASIX) 20 MG tablet Take 1 tablet (20 mg total) by mouth daily.   lidocaine (LIDODERM) 5 % Place 1 patch onto the skin daily. Remove & Discard patch within 12 hours or as directed by MD   valsartan (DIOVAN) 80 MG tablet Take 1 tablet (80 mg total) by mouth daily.   No facility-administered encounter medications on file as of 03/27/2023.    Allergies  Allergen Reactions   Donepezil Other (See Comments)    bradycardia   Actonel [Risedronate Sodium] Nausea And Vomiting   Aspirin-Caffeine  Other (See Comments)    Upset stomach    Codeine Other (See Comments)    Upset stomach   Memantine Other (See Comments)    Unknown    Penicillins Other (See Comments)    Unknown    Sulfonamide Derivatives Other (See Comments)    Swollen tongue    Review of Systems As per HPI  Objective:  BP 138/66   Pulse 69   Temp 98 F (36.7 C)   Ht 5\' 3"  (1.6 m)   Wt 103 lb (46.7 kg)   SpO2 99%   BMI 18.25 kg/m    Wt Readings from Last 3 Encounters:  03/27/23 103 lb (46.7 kg)  03/18/23  103 lb 12.8 oz (47.1 kg)  02/18/23 104 lb 12.8 oz (47.5 kg)    Physical Exam Constitutional:      General: She is awake. She is not in acute distress.    Appearance: Normal appearance. She is well-developed, well-groomed and underweight. She is not ill-appearing, toxic-appearing or diaphoretic.  Cardiovascular:     Rate and Rhythm: Normal rate and regular rhythm.     Pulses: Normal pulses.          Radial pulses are 2+ on the right side and 2+ on the left side.       Posterior tibial pulses are 2+ on the right side and 2+ on the left side.     Heart sounds: Normal heart sounds. No murmur heard.    No gallop.  Pulmonary:     Effort: Pulmonary effort is normal. No respiratory distress.     Breath sounds: Normal breath sounds. No stridor. No wheezing, rhonchi or rales.  Musculoskeletal:     Cervical back: Full passive range of motion without pain and neck supple.     Left hip: Tenderness present. No deformity, lacerations, bony tenderness or crepitus. Decreased range of motion. Decreased strength.     Right lower leg: No edema.     Left lower leg: No edema.     Comments: Point tenderness at lesser trochanter  Walks with cane, generalized weakness   Skin:    General: Skin is warm.     Capillary Refill: Capillary refill takes less than 2 seconds.  Neurological:     General: No focal deficit present.     Mental Status: She is alert, oriented to person, place, and time and easily aroused. Mental  status is at baseline.     GCS: GCS eye subscore is 4. GCS verbal subscore is 5. GCS motor subscore is 6.     Motor: No weakness.  Psychiatric:        Attention and Perception: Attention and perception normal.        Mood and Affect: Mood and affect normal.        Speech: Speech normal.        Behavior: Behavior normal. Behavior is cooperative.        Thought Content: Thought content normal. Thought content does not include homicidal or suicidal ideation. Thought content does not include homicidal or suicidal plan.        Cognition and Memory: Cognition and memory normal.        Judgment: Judgment normal.     Results for orders placed or performed in visit on 01/05/23  CBC with Differential/Platelet   Collection Time: 01/05/23  2:35 PM  Result Value Ref Range   WBC 6.3 3.4 - 10.8 x10E3/uL   RBC 3.78 3.77 - 5.28 x10E6/uL   Hemoglobin 12.0 11.1 - 15.9 g/dL   Hematocrit 16.1 09.6 - 46.6 %   MCV 97 79 - 97 fL   MCH 31.7 26.6 - 33.0 pg   MCHC 32.7 31.5 - 35.7 g/dL   RDW 04.5 40.9 - 81.1 %   Platelets 208 150 - 450 x10E3/uL   Neutrophils 67 Not Estab. %   Lymphs 22 Not Estab. %   Monocytes 8 Not Estab. %   Eos 1 Not Estab. %   Basos 1 Not Estab. %   Neutrophils Absolute 4.3 1.4 - 7.0 x10E3/uL   Lymphocytes Absolute 1.4 0.7 - 3.1 x10E3/uL   Monocytes Absolute 0.5 0.1 - 0.9 x10E3/uL   EOS (ABSOLUTE)  0.1 0.0 - 0.4 x10E3/uL   Basophils Absolute 0.0 0.0 - 0.2 x10E3/uL   Immature Granulocytes 1 Not Estab. %   Immature Grans (Abs) 0.0 0.0 - 0.1 x10E3/uL  CMP14+EGFR   Collection Time: 01/05/23  2:35 PM  Result Value Ref Range   Glucose 90 70 - 99 mg/dL   BUN 22 10 - 36 mg/dL   Creatinine, Ser 1.61 (H) 0.57 - 1.00 mg/dL   eGFR 38 (L) >09 UE/AVW/0.98   BUN/Creatinine Ratio 17 12 - 28   Sodium 144 134 - 144 mmol/L   Potassium 4.3 3.5 - 5.2 mmol/L   Chloride 105 96 - 106 mmol/L   CO2 24 20 - 29 mmol/L   Calcium 9.7 8.7 - 10.3 mg/dL   Total Protein 6.5 6.0 - 8.5 g/dL   Albumin 4.2  3.6 - 4.6 g/dL   Globulin, Total 2.3 1.5 - 4.5 g/dL   Bilirubin Total 0.4 0.0 - 1.2 mg/dL   Alkaline Phosphatase 83 44 - 121 IU/L   AST 21 0 - 40 IU/L   ALT 18 0 - 32 IU/L  TSH + free T4   Collection Time: 01/05/23  2:35 PM  Result Value Ref Range   TSH 3.150 0.450 - 4.500 uIU/mL   Free T4 1.19 0.82 - 1.77 ng/dL       03/28/1476    2:95 PM 02/18/2023    1:07 PM 01/05/2023    2:03 PM 08/11/2022    3:23 PM 05/28/2022    2:06 PM  Depression screen PHQ 2/9  Decreased Interest 0 0 0 0 0  Down, Depressed, Hopeless 0 0 0 0 0  PHQ - 2 Score 0 0 0 0 0  Altered sleeping 0  0    Tired, decreased energy 0  0    Change in appetite 0  0    Feeling bad or failure about yourself  0  0    Trouble concentrating 0  0    Moving slowly or fidgety/restless 0  1    Suicidal thoughts 0  0    PHQ-9 Score 0  1    Difficult doing work/chores Not difficult at all           01/05/2023    2:04 PM 05/01/2022    2:00 PM 05/08/2021    4:50 PM 02/04/2021    1:10 PM  GAD 7 : Generalized Anxiety Score  Nervous, Anxious, on Edge 1 0 0 1  Control/stop worrying 0 0 2 3  Worry too much - different things 1 0 2 2  Trouble relaxing 0 0 0 0  Restless 0 0 0 0  Easily annoyed or irritable 0 0 2 0  Afraid - awful might happen 0 0 0 0  Total GAD 7 Score 2 0 6 6  Anxiety Difficulty Not difficult at all Not difficult at all Not difficult at all Not difficult at all   Pertinent labs & imaging results that were available during my care of the patient were reviewed by me and considered in my medical decision making.  Assessment & Plan:  Sharon "[name]" was seen today for hip pain.  Diagnoses and all orders for this visit:  Left hip pain Read of left hip not available. Will start medication as below. Patient to continue to avoid NSAIDs due to cardiac history. Continue lidocaine patches and topical creams.  -     predniSONE (DELTASONE) 20 MG tablet; Take 2 tablets (40 mg total) by mouth daily  with breakfast for 5  days.  Continue all other maintenance medications.  Follow up plan: Return if symptoms worsen or fail to improve.  Continue healthy lifestyle choices, including diet (rich in fruits, vegetables, and lean proteins, and low in salt and simple carbohydrates) and exercise (at least 30 minutes of moderate physical activity daily).  Written and verbal instructions provided   The above assessment and management plan was discussed with the patient. The patient verbalized understanding of and has agreed to the management plan. Patient is aware to call the clinic if they develop any new symptoms or if symptoms persist or worsen. Patient is aware when to return to the clinic for a follow-up visit. Patient educated on when it is appropriate to go to the emergency department.   Neale Burly, DNP-FNP [facility] [address] [place], [place] [address] [phone]

## 2023-03-27 NOTE — Telephone Encounter (Signed)
  Chief Complaint: L hip pain per caller Symptoms: pain Frequency: one month worsening over the last week Pertinent Negatives: Patient denies injury Disposition: [] ED /[] Urgent Care (no appt availability in office) / [x] Appointment(In office/virtual)/ []  [place] [facility]/ [] Home Care/ [] Refused Recommended Disposition /[] [place] [facility]/ []  Follow-up with PCP Additional Notes: Patient's daughter Rosey Bath on [place] called reporting patient has had worsening L hip pain. She states the patient was recently seen, given lidocaine patches and Voltaren which is providing no relief. She states they were told she cannot take tylenol at last visit and she is wanting clarification on that. States patient can walk, but pain is worse with ambulation. She is requesting eval for pain control as well as results from recent imaging. Per protocol, patient to be evaluated within 4 hours. Caller requests latest appt today- scheduled for 1550 per caller request. Care advice reviewed with patient, understanding verbalized. Denies further questions at this time. Alerting PCP for review.    Copied from CRM [id number]. Topic: Clinical - Red Word Triage >> Mar 27, 2023  9:14 AM Desma Mcgregor wrote: Red Word that prompted transfer to Nurse Triage: Terrible pain in hips, back, and left side/knee. Was given some pain patches, using a heating pad and a cortisone shot but nothing's helping. Patient not too well. Daughter Rosey Bath on the line asking if there is anything else that can be done to help the patient. Reason for Disposition  [1] SEVERE pain (e.g., excruciating, unable to do any normal activities) AND [2] not improved after 2 hours of pain medicine  Answer Assessment - Initial Assessment Questions 1. LOCATION and RADIATION: "Where is the pain located?"      I think it is her left side 2. QUALITY: "What does the pain feel like?"  (e.g., sharp, dull, aching, burning)     Sharp pain 3. SEVERITY: "How bad is the pain?"  "What does it keep you from doing?"   (Scale 1-10; or mild, moderate, severe)   -  MILD (1-3): doesn't interfere with normal activities    -  MODERATE (4-7): interferes with normal activities (e.g., work or school) or awakens from sleep, limping    -  SEVERE (8-10): excruciating pain, unable to do any normal activities, unable to walk     Walking with cane, sitting with heating pad 4. ONSET: "When did the pain start?" "Does it come and go, or is it there all the time?"     Over one month, worsening this week 5. WORK OR EXERCISE: "Has there been any recent work or exercise that involved this part of the body?"      Walked on 03/23/23 to get imaging and thinks that increased pain 6. CAUSE: "What do you think is causing the hip pain?"      Unsure, maybe arthritis 7. AGGRAVATING FACTORS: "What makes the hip pain worse?" (e.g., walking, climbing stairs, running)     walking 8. OTHER SYMPTOMS: "Do you have any other symptoms?" (e.g., back pain, pain shooting down leg,  fever, rash)     Unsure  Protocols used: Hip Pain-A-AH

## 2023-03-30 ENCOUNTER — Ambulatory Visit: Payer: Self-pay | Admitting: Family Medicine

## 2023-03-30 NOTE — Telephone Encounter (Signed)
Copied from CRM [id number]. Topic: Clinical - Medication Question >> Mar 30, 2023  4:21 PM Prudencio Pair wrote: Reason for CRM: Patient's daughter, Rosey Bath, called stating that they received results from x-rays today. States that it's just arthritis in patient's hip. She was given a shot on Tuesday for pain but nothing is helping her. Rosey Bath wants to know can something be prescribed for her mom or does she need to bring her back to be seen or maybe even make an appt for patient to be seen with an orthopedic provider? Please give pt's daughter, Rosey Bath, a call back to advise. CB #: [phone]. Reason for Disposition  [1] Caller requests to speak ONLY to PCP AND [2] NON-URGENT question  Answer Assessment - Initial Assessment Questions 1. REASON FOR CALL or QUESTION: "What is your reason for calling today?" or "How can I best help you?" or "What question do you have that I can help answer?"     Patient would like advice from PCP on what else can be done for pain since the X Ray came back as arthritis or does patient need to be seen in office again? Patient's daughter is wondering if she needs an orthopaedic consult, or what next steps are. Please advise to patient and daughter. [phone]. 2. CALLER: Document the source of call. (e.g., laboratory, patient).     Daughter of patient  Protocols used: PCP Call - No Triage-A-AH

## 2023-03-31 NOTE — Telephone Encounter (Signed)
Left message for [name] to call back to make pt an appt because PCP wants pt to be re-evaluated.

## 2023-03-31 NOTE — Telephone Encounter (Signed)
Pt. Needs to be seen for this. I have several openings tomorrow. Thanks, WS

## 2023-04-01 ENCOUNTER — Encounter: Payer: Self-pay | Admitting: Family Medicine

## 2023-04-01 ENCOUNTER — Ambulatory Visit: Payer: PPO | Admitting: Family Medicine

## 2023-04-01 VITALS — BP 139/62 | HR 65 | Temp 97.4°F | Ht 63.0 in | Wt 103.0 lb

## 2023-04-01 DIAGNOSIS — E039 Hypothyroidism, unspecified: Secondary | ICD-10-CM

## 2023-04-01 DIAGNOSIS — I5022 Chronic systolic (congestive) heart failure: Secondary | ICD-10-CM | POA: Diagnosis not present

## 2023-04-01 DIAGNOSIS — I480 Paroxysmal atrial fibrillation: Secondary | ICD-10-CM | POA: Diagnosis not present

## 2023-04-01 DIAGNOSIS — I10 Essential (primary) hypertension: Secondary | ICD-10-CM | POA: Diagnosis not present

## 2023-04-01 DIAGNOSIS — E782 Mixed hyperlipidemia: Secondary | ICD-10-CM

## 2023-04-01 MED ORDER — GABAPENTIN 100 MG PO CAPS: 300.0000 mg | ORAL_CAPSULE | Freq: Every day | ORAL | 1 refills | Status: DC

## 2023-04-01 MED ORDER — TRAMADOL HCL 50 MG PO TABS: ORAL_TABLET | ORAL | 2 refills | Status: DC

## 2023-04-01 MED ORDER — ACETAMINOPHEN 500 MG PO TABS: 1000.0000 mg | ORAL_TABLET | Freq: Three times a day (TID) | ORAL | Status: DC

## 2023-04-01 NOTE — Progress Notes (Signed)
Subjective:  Patient ID: [name], female    DOB: [birth year]  Age: 89 y.o. MRN: [id number]  CC: Hip Pain (Left hip pain. Ran out of medication. Radiates down into her knee. )   HPI [name] presents for ongoing pain in the left hip. It is 8/10 with activity and 5/10 at rest. Better today. Just finished a course of prednisone.      04/01/2023    2:41 PM 03/27/2023    4:16 PM 02/18/2023    1:07 PM  Depression screen PHQ 2/9  Decreased Interest 0 0 0  Down, Depressed, Hopeless 0 0 0  PHQ - 2 Score 0 0 0  Altered sleeping 0 0   Tired, decreased energy 0 0   Change in appetite 0 0   Feeling bad or failure about yourself  0 0   Trouble concentrating 0 0   Moving slowly or fidgety/restless 0 0   Suicidal thoughts 0 0   PHQ-9 Score 0 0   Difficult doing work/chores Not difficult at all Not difficult at all     History [name] has a past medical history of Arthritis, Atrial fibrillation (HCC), COVID-19, CVA (cerebral vascular accident) (HCC), HTN (hypertension), MR (mitral regurgitation), NSTEMI (non-ST elevated myocardial infarction) (HCC), Orthostatic hypotension (11/06/2011), and Pericarditis.   She has a past surgical history that includes anterior repair with perigee graft (04/16/06); Anterior and posterior vaginal repair (10/15/04); sacral spinous ligament suspension of vagina; suprapubic cystectomy; Total abdominal hysterectomy w/ bilateral salpingoophorectomy; and Mitral valve replacement.   Her family history includes Colon cancer in her brother; Heart attack in her father; Heart disease in her brother, father, and mother; Lupus in her father.She reports that she has never smoked. She has never used smokeless tobacco. She reports that she does not drink alcohol and does not use drugs.    ROS Review of Systems  Objective:  BP 139/62   Pulse 65   Temp (!) 97.4 F (36.3 C)   Ht 5\' 3"  (1.6 m)   Wt 103 lb (46.7 kg)   SpO2 98%   BMI 18.25 kg/m   BP Readings from Last 3  Encounters:  04/01/23 139/62  03/27/23 138/66  03/24/23 (!) 150/67    Wt Readings from Last 3 Encounters:  04/01/23 103 lb (46.7 kg)  03/27/23 103 lb (46.7 kg)  03/18/23 103 lb 12.8 oz (47.1 kg)     Physical Exam Constitutional:      General: She is not in acute distress.    Appearance: She is well-developed.  Cardiovascular:     Rate and Rhythm: Normal rate and regular rhythm.  Pulmonary:     Breath sounds: Normal breath sounds.  Musculoskeletal:        General: Tenderness (left posterior hip and lesser trochanter at left hip) present. Normal range of motion.  Skin:    General: Skin is warm and dry.  Neurological:     Mental Status: She is alert and oriented to person, place, and time.       Assessment & Plan:   Whisper "[name]" was seen today for hip pain.  Diagnoses and all orders for this visit:  Hypothyroidism, unspecified type -     TSH + free T4  Essential hypertension -     CBC with Differential/Platelet  Paroxysmal atrial fibrillation (HCC) -     CBC with Differential/Platelet -     CMP14+EGFR  Chronic HFrEF (heart failure with reduced ejection fraction) (HCC) -  CBC with Differential/Platelet -     CMP14+EGFR  Mixed hyperlipidemia -     CMP14+EGFR  Other orders -     acetaminophen (TYLENOL) 500 MG tablet; Take 2 tablets (1,000 mg total) by mouth 3 (three) times daily. -     gabapentin (NEURONTIN) 100 MG capsule; Take 3 capsules (300 mg total) by mouth at bedtime. For arthritis pain. -     Discontinue: traMADol (ULTRAM) 50 MG tablet; Can take f times a day for five days. Then decrease to one twice a day. -     traMADol (ULTRAM) 50 MG tablet; Can take one four  times a day for five days. Then decrease to one twice a day.       I have discontinued Roise M. Peretti "[name]"'s acetaminophen and predniSONE. I have also changed her traMADol. Additionally, I am having her start on acetaminophen and gabapentin. Lastly, I am having her maintain her Biotin,  cholecalciferol, eplerenone, valsartan, furosemide, atorvastatin, amiodarone, Eliquis, and lidocaine.  Allergies as of 04/01/2023       Reactions   Donepezil Other (See Comments)   bradycardia   Actonel [risedronate Sodium] Nausea And Vomiting   Aspirin-caffeine Other (See Comments)   Upset stomach   Codeine Other (See Comments)   Upset stomach   Memantine Other (See Comments)   Unknown    Penicillins Other (See Comments)   Unknown    Sulfonamide Derivatives Other (See Comments)   Swollen tongue        Medication List        Accurate as of April 01, 2023  8:32 PM. If you have any questions, ask your nurse or doctor.          STOP taking these medications    predniSONE 20 MG tablet Commonly known as: DELTASONE Stopped by: Raianna Slight       TAKE these medications    acetaminophen 500 MG tablet Commonly known as: TYLENOL Take 2 tablets (1,000 mg total) by mouth 3 (three) times daily. What changed:  medication strength how much to take when to take this reasons to take this Changed by: Starr Urias   amiodarone 200 MG tablet Commonly known as: PACERONE Take 1 tablet (200 mg total) by mouth daily.   atorvastatin 10 MG tablet Commonly known as: LIPITOR Take 1 tablet by mouth once daily   Biotin 1000 MCG tablet Take 1,000 mcg by mouth daily.   cholecalciferol 25 MCG (1000 UNIT) tablet Commonly known as: VITAMIN D3 Take 1,000 Units by mouth daily.   Eliquis 2.5 MG Tabs tablet Generic drug: apixaban Take 1 tablet by mouth twice daily   eplerenone 25 MG tablet Commonly known as: Inspra Take 1 tablet (25 mg total) by mouth daily.   furosemide 20 MG tablet Commonly known as: LASIX Take 1 tablet (20 mg total) by mouth daily.   gabapentin 100 MG capsule Commonly known as: NEURONTIN Take 3 capsules (300 mg total) by mouth at bedtime. For arthritis pain. Started by: Valmai Vandenberghe   lidocaine 5 % Commonly known as: Lidoderm Place 1 patch onto  the skin daily. Remove & Discard patch within 12 hours or as directed by MD   traMADol 50 MG tablet Commonly known as: ULTRAM Can take one four  times a day for five days. Then decrease to one twice a day. Started by: Araiya Tilmon   valsartan 80 MG tablet Commonly known as: DIOVAN Take 1 tablet (80 mg total) by mouth daily.  Follow-up: Return in about 3 months (around 06/30/2023).  Mechele Claude, M.D.

## 2023-04-13 ENCOUNTER — Telehealth: Payer: Self-pay | Admitting: Family Medicine

## 2023-04-13 DIAGNOSIS — Z0279 Encounter for issue of other medical certificate: Secondary | ICD-10-CM

## 2023-04-13 NOTE — Telephone Encounter (Signed)
[name] dropped off FMLA forms to be completed and signed.  Form Fee Paid? (Y/N)       YES     If NO, form is placed on front office manager desk to hold until payment received. If YES, then form will be placed in the RX/HH Nurse Coordinators box for completion.  Form will not be processed until payment is received

## 2023-04-17 ENCOUNTER — Ambulatory Visit: Payer: Self-pay | Admitting: Family Medicine

## 2023-04-17 NOTE — Telephone Encounter (Signed)
 Copied from CRM [id number]. Topic: General - Other >> Apr 17, 2023  3:10 PM Cherylann S wrote: Reason for CRM: Patient is in sever pain in her joint in her left hip. Pain medication that was prescribed gabapentin  (NEURONTIN ) 100 MG capsule was making her sick. In addition, the traMADol  (ULTRAM ) 50 MG tablet is not working as the patient is still in a severe amount of pain. Patient is having to rub Biofreeze on her hip to get a little bit of relief.   Chief Complaint: LEFT hip pain Symptoms: dull achy pain when sitting, sharp pain when standing or walking, radiates down to knee Frequency: intermittent Pertinent Negatives: Patient denies falls or injuries Disposition: [] ED /[] Urgent Care (no appt availability in office) / [x] Appointment(In office/virtual)/ []  [facility]/ [] Home Care/ [] Refused Recommended Disposition /[] [facility]/ []  Follow-up with PCP Additional Notes: Patient with LEFT hip pain since December, getting worse the past few days. Has been taking Tramadol  for pain but states that it upset her stomach. Pt does feel better taking Tylenol  and using biofreeze, but still has dull, achy pain when sitting down and sharp pains when applying pressure to walk. Appt scheduled for 02/10 at 11:20a  Reason for Disposition  [1] MODERATE pain (e.g., interferes with normal activities, limping) AND [2] present > 3 days  Answer Assessment - Initial Assessment Questions 1. LOCATION and RADIATION: Where is the pain located?      Left hip  2. QUALITY: What does the pain feel like?  (e.g., sharp, dull, aching, burning)     Dull pain when sitting, worse, sharp shooting pain when standing and  putting pressure.   3. SEVERITY: How bad is the pain? What does it keep you from doing?   (Scale 1-10; or mild, moderate, severe)   -  MILD (1-3): doesn't interfere with normal activities    -  MODERATE (4-7): interferes with normal activities (e.g., work or school) or awakens from sleep,  limping    -  SEVERE (8-10): excruciating pain, unable to do any normal activities, unable to walk     8/10 pain  4. ONSET: When did the pain start? Does it come and go, or is it there all the time?     Pain started in December 2024, getting worse last few days  5. WORK OR EXERCISE: Has there been any recent work or exercise that involved this part of the body?      No new exercises  6. CAUSE: What do you think is causing the hip pain?      Unsure of cause; but has to get up frequently to use bathroom and cold weather   7. AGGRAVATING FACTORS: What makes the hip pain worse? (e.g., walking, climbing stairs, running)     Standing and applying pressure  8. OTHER SYMPTOMS: Do you have any other symptoms? (e.g., back pain, pain shooting down leg,  fever, rash)     Pain radiates down to knee.  Protocols used: Hip Pain-A-AH

## 2023-04-20 ENCOUNTER — Ambulatory Visit (INDEPENDENT_AMBULATORY_CARE_PROVIDER_SITE_OTHER): Payer: PPO | Admitting: Nurse Practitioner

## 2023-04-20 ENCOUNTER — Encounter: Payer: Self-pay | Admitting: Nurse Practitioner

## 2023-04-20 VITALS — BP 130/63 | HR 84 | Temp 96.9°F | Ht 63.0 in | Wt 100.0 lb

## 2023-04-20 DIAGNOSIS — M160 Bilateral primary osteoarthritis of hip: Secondary | ICD-10-CM | POA: Diagnosis not present

## 2023-04-20 DIAGNOSIS — M25552 Pain in left hip: Secondary | ICD-10-CM

## 2023-04-20 NOTE — Progress Notes (Signed)
 Acute Office Visit  Subjective:     Patient ID: [name], female    DOB: [birth year], 89 y.o.   MRN: [id number] Patient Care Team: Sharon Cleaver, MD as PCP - General (Family Medicine) Sharon Rule, MD as PCP - Cardiology (Cardiology) Sharon Rule, MD as Consulting Physician (Cardiology) Sharon Rider, MD as Consulting Physician (Neurology) Sharon Repine, MD as Consulting Physician (Pulmonary Disease) Sharon Hikes, MD as Referring Physician (Dermatology) Sharon Berger, RN as [facility] Care Management   Chief Complaint  Patient presents with   Hip Pain    Left hip pain, gotten worse, no falls    HPI  [name] is 89 yrs old female presents with her daughter on 04/20/2023 concerns for left hip pain.  She has been seen at the clinic three times already for the same issue.  Last seen 03/26/2022 by [name] and was prescribed a 5-day course of prednisone  which she completed and reports that she is still experiencing pain.  She also had a hip x-ray 03/23/2023 that showed no fracture or dislocation just mild degenerative arthritis bilaterally.  Client is called the being managed with lidocaine  patch, tramadol , and Tylenol  as needed.  As she reported Tylenol  is the only thing that really relieves the pain but only been taking it once a day.  He is requesting a steroid injection however last infection was on 03/27/2023 and just got done with a 5-day course of prednisone .  Reports 10/10 pain that starts as  sharp and go back to dull, pain is constant relief by 'Tylenol "  Active Ambulatory Problems    Diagnosis Date Noted   Essential hypertension 08/26/2008   PULMONARY HYPERTENSION, MILD 09/26/2009   DIVERTICULOSIS, COLON 09/26/2009   TRANSIENT ISCHEMIC ATTACKS, HX OF 09/26/2009   History of colonic polyps 09/26/2009   ISCHEMIC COLITIS, HX OF 09/26/2009   Mitral valve failure 10/09/2010   RBBB 10/12/2013   Hypothyroidism 01/06/2017   Iron deficiency  anemia due to chronic blood loss 01/12/2017   Paroxysmal atrial fibrillation (HCC) 04/09/2020   Cardiomyopathy (HCC) 04/09/2020   Senile dementia without behavioral disturbance (HCC) 07/09/2020   Fever    Mild cognitive impairment 02/04/2021   Ascending aortic aneurysm (HCC) 05/15/2021   Chronic HFrEF (heart failure with reduced ejection fraction) (HCC) 05/17/2022   CHF (congestive heart failure) (HCC) 05/17/2022   CKD (chronic kidney disease) 05/19/2022   Acute kidney injury superimposed on chronic kidney disease stage IIIa (HCC) 05/23/2022   Hyperlipidemia 05/23/2022   Left hip pain 03/24/2023   Bilateral hip joint arthritis 04/20/2023   Resolved Ambulatory Problems    Diagnosis Date Noted   Weakness 11/06/2011   Orthostatic hypotension 11/06/2011   Varicose veins 10/12/2013   NSTEMI (non-ST elevated myocardial infarction) (HCC) 04/09/2020   TIA (transient ischemic attack) 08/08/2020   Atrial fibrillation with RVR (HCC) 09/27/2020   Palpitations    Paroxysmal A-fib (HCC) 09/28/2020   Dyspnea 03/05/2021   Multifocal pneumonia 03/05/2021   Near syncope 04/05/2020   Past Medical History:  Diagnosis Date   Arthritis    Atrial fibrillation (HCC)    COVID-19    CVA (cerebral vascular accident) (HCC)    HTN (hypertension)    MR (mitral regurgitation)    Pericarditis      ROS Negative unless indicated in HPI    Objective:    BP 130/63   Pulse 84   Temp (!) 96.9 F (36.1 C) (Oral)   Ht 5'  3" (1.6 m)   Wt 100 lb (45.4 kg)   SpO2 98%   BMI 17.71 kg/m  BP Readings from Last 3 Encounters:  04/20/23 130/63  04/01/23 139/62  03/27/23 138/66   Wt Readings from Last 3 Encounters:  04/20/23 100 lb (45.4 kg)  04/01/23 103 lb (46.7 kg)  03/27/23 103 lb (46.7 kg)      Physical Exam Vitals and nursing note reviewed.  Constitutional:      General: She is not in acute distress. HENT:     Head: Normocephalic and atraumatic.     Nose: Nose normal.     Mouth/Throat:      Mouth: Mucous membranes are moist.  Eyes:     General: No scleral icterus.    Extraocular Movements: Extraocular movements intact.     Conjunctiva/sclera: Conjunctivae normal.     Pupils: Pupils are equal, round, and reactive to light.  Cardiovascular:     Rate and Rhythm: Normal rate and regular rhythm.     Pulses: Normal pulses.     Heart sounds: Normal heart sounds.  Abdominal:     Tenderness: There is abdominal tenderness.  Musculoskeletal:     Right lower leg: No edema.     Left lower leg: No edema.     Comments: Use a cane for ambulation  Skin:    General: Skin is warm and dry.     Findings: No rash.  Neurological:     Mental Status: She is alert and oriented to person, place, and time.     Gait: Gait abnormal.     Comments: Slow unsteady gait uses a cane for ambulation     No results found for any visits on 04/20/23.      Assessment & Plan:  Bilateral hip joint arthritis  Left hip pain  [name] 89 year old Caucasian female seen today for left hip pain, no acute distress Continue Tylenol  1000 mg 3 times daily as needed for pain with food, lidocaine  patch, tramadol  twice daily, and over-the-counter Voltaren gel Use heat every 15 minutes as needed Encourage healthy lifestyle choices, including diet (rich in fruits, vegetables, and lean proteins, and low in salt and simple carbohydrates) and exercise (at least 30 minutes of moderate physical activity daily).     The above assessment and management plan was discussed with the patient. The patient verbalized understanding of and has agreed to the management plan. Patient is aware to call the clinic if they develop any new symptoms or if symptoms persist or worsen. Patient is aware when to return to the clinic for a follow-up visit. Patient educated on when it is appropriate to go to the emergency department.  Return if symptoms worsen or fail to improve.  Sharon Shawgo [place] Thompson, DNP [facility] [address] [place], [place] [address] [phone]  Note: This document was prepared by [name] voice dictation technology and any errors that results from this process are unintentional.

## 2023-04-20 NOTE — Telephone Encounter (Signed)
 LM that forms were not ready to be picked up, Dr. Veleta Gerold has been out of the office,

## 2023-04-20 NOTE — Telephone Encounter (Signed)
 [name] is calling to check and see if her FMLA papers have been completed and ready for pick up. Her sister [name] is bringing the patient (their mom) in today at 11:15am and is wanting the papers to be given to her if they are ready for pick up.

## 2023-04-22 ENCOUNTER — Telehealth: Payer: Self-pay | Admitting: Pharmacy Technician

## 2023-04-22 ENCOUNTER — Other Ambulatory Visit (HOSPITAL_COMMUNITY): Payer: Self-pay

## 2023-04-22 NOTE — Telephone Encounter (Signed)
Pharmacy Patient Advocate Encounter   Received notification from CoverMyMeds that prior authorization for Lidocaine Patches 5% is required/requested.  Insurance verification completed.   The patient is insured through [facility] ADVANTAGE/RX ADVANCE   Ran test claim for Lidocaine Patches 5% for a quantity of 30 as a 30 day supply.  Per test claim:    PA WILL BE DENIED unless patient has a history of pain associated with Postherpetic neuralgia. No documentation found on patients chart.  This test claim was processed through [facility]- copay amounts may vary at other pharmacies due to pharmacy/plan contracts, or as the patient moves through the different stages of their insurance plan.

## 2023-05-13 ENCOUNTER — Ambulatory Visit: Payer: Medicare HMO | Admitting: Cardiovascular Disease

## 2023-05-14 ENCOUNTER — Telehealth: Payer: Self-pay

## 2023-05-14 NOTE — Telephone Encounter (Signed)
 Copied from CRM [id number]. Topic: General - Other >> May 14, 2023  4:32 PM Eunice Blase wrote: Reason for CRM: Pt and daughter do not want [facility] was rude in her communication. Do not want her treating pt. Please call daughter [phone].

## 2023-05-15 NOTE — Telephone Encounter (Signed)
 Daughter is upset. Please call back

## 2023-05-22 ENCOUNTER — Other Ambulatory Visit: Payer: Self-pay | Admitting: Cardiovascular Disease

## 2023-06-30 ENCOUNTER — Ambulatory Visit (INDEPENDENT_AMBULATORY_CARE_PROVIDER_SITE_OTHER): Payer: PPO | Admitting: Family Medicine

## 2023-06-30 ENCOUNTER — Encounter: Payer: Self-pay | Admitting: Family Medicine

## 2023-06-30 ENCOUNTER — Telehealth: Payer: Self-pay | Admitting: Family Medicine

## 2023-06-30 VITALS — BP 164/73 | HR 64 | Temp 98.1°F | Ht 63.0 in | Wt 99.0 lb

## 2023-06-30 DIAGNOSIS — I5022 Chronic systolic (congestive) heart failure: Secondary | ICD-10-CM | POA: Diagnosis not present

## 2023-06-30 DIAGNOSIS — E039 Hypothyroidism, unspecified: Secondary | ICD-10-CM

## 2023-06-30 DIAGNOSIS — M25552 Pain in left hip: Secondary | ICD-10-CM | POA: Diagnosis not present

## 2023-06-30 DIAGNOSIS — E782 Mixed hyperlipidemia: Secondary | ICD-10-CM | POA: Diagnosis not present

## 2023-06-30 DIAGNOSIS — Z0279 Encounter for issue of other medical certificate: Secondary | ICD-10-CM

## 2023-06-30 DIAGNOSIS — G3184 Mild cognitive impairment, so stated: Secondary | ICD-10-CM

## 2023-06-30 LAB — LIPID PANEL

## 2023-06-30 MED ORDER — FUROSEMIDE 20 MG PO TABS: 20.0000 mg | ORAL_TABLET | Freq: Every day | ORAL | 1 refills | Status: DC

## 2023-06-30 MED ORDER — GABAPENTIN 300 MG PO CAPS: 600.0000 mg | ORAL_CAPSULE | Freq: Every day | ORAL | 2 refills | Status: DC

## 2023-06-30 NOTE — Telephone Encounter (Signed)
 [name] (daughter) dropped off (x2) Handicap forms to be completed and signed.  Form Fee Paid? (Y/N)      YES--paid for 2 of them      If NO, form is placed on front office manager desk to hold until payment received. If YES, then form will be placed in the RX/HH Nurse Coordinators box for completion.  Form will not be processed until payment is received   Call daughter, when both forms are ready!

## 2023-06-30 NOTE — Progress Notes (Signed)
 Subjective:  Patient ID: [name], female    DOB: 04-21-1928  Age: 89 y.o. MRN: [id number]  CC: Medical Management of Chronic Issues (Seeing [facility] tomorrow regarding hip michael leamon. / )   HPI [name] presents for left hip pain radiating to the left thigh.  She is post to go to order orthopedics tomorrow.  Patient presents for follow-up on  thyroid . The patient has a history of hypothyroidism for many years. It has been stable recently. Pt. denies any change in  voice, loss of hair, heat or cold intolerance. Energy level has been adequate to good. Patient denies constipation and diarrhea. No myxedema. Medication is as noted below. Verified that pt is taking it daily on an empty stomach. Well tolerated.   Follow-up of hypertension. Patient has no history of headache chest pain or shortness of breath or recent cough. Patient also denies symptoms of TIA such as numbness weakness lateralizing. Patient checks  blood pressure at home and has not had any elevated readings recently. Patient denies side effects from his medication. States taking it regularly.   Patient also has been treated for heart failure with reduced ejection fraction.  Currently she is breathing well and not swollen.       04/20/2023   11:19 AM 04/01/2023    2:41 PM 03/27/2023    4:16 PM  Depression screen PHQ 2/9  Decreased Interest 0 0 0  Down, Depressed, Hopeless 0 0 0  PHQ - 2 Score 0 0 0  Altered sleeping 0 0 0  Tired, decreased energy  0 0  Change in appetite  0 0  Feeling bad or failure about yourself   0 0  Trouble concentrating  0 0  Moving slowly or fidgety/restless  0 0  Suicidal thoughts  0 0  PHQ-9 Score 0 0 0  Difficult doing work/chores  Not difficult at all Not difficult at all    History [name] has a past medical history of Arthritis, Atrial fibrillation (HCC), COVID-19, CVA (cerebral vascular accident) (HCC), HTN (hypertension), MR (mitral regurgitation), NSTEMI (non-ST elevated  myocardial infarction) (HCC), Orthostatic hypotension (11/06/2011), and Pericarditis.   She has a past surgical history that includes anterior repair with perigee graft (04/16/06); Anterior and posterior vaginal repair (10/15/04); sacral spinous ligament suspension of vagina; suprapubic cystectomy; Total abdominal hysterectomy w/ bilateral salpingoophorectomy; and Mitral valve replacement.   Her family history includes Colon cancer in her brother; Heart attack in her father; Heart disease in her brother, father, and mother; Lupus in her father.She reports that she has never smoked. She has never used smokeless tobacco. She reports that she does not drink alcohol and does not use drugs.    ROS Review of Systems  Constitutional: Negative.   HENT: Negative.    Eyes:  Negative for visual disturbance.  Respiratory:  Negative for shortness of breath.   Cardiovascular:  Negative for chest pain.  Gastrointestinal:  Negative for abdominal pain.  Musculoskeletal:  Negative for arthralgias (Left hip).    Objective:  BP (!) 164/73   Pulse 64   Temp 98.1 F (36.7 C)   Ht 5\' 3"  (1.6 m)   Wt 99 lb (44.9 kg)   SpO2 96%   BMI 17.54 kg/m   BP Readings from Last 3 Encounters:  06/30/23 (!) 164/73  04/20/23 130/63  04/01/23 139/62    Wt Readings from Last 3 Encounters:  06/30/23 99 lb (44.9 kg)  04/20/23 100 lb (45.4 kg)  04/01/23 103  lb (46.7 kg)     Physical Exam Constitutional:      General: She is not in acute distress.    Appearance: She is well-developed.  Cardiovascular:     Rate and Rhythm: Normal rate and regular rhythm.  Pulmonary:     Breath sounds: Normal breath sounds.  Musculoskeletal:        General: Normal range of motion.  Skin:    General: Skin is warm and dry.  Neurological:     Mental Status: She is alert and oriented to person, place, and time.      Assessment & Plan:  Chronic HFrEF (heart failure with reduced ejection fraction) (HCC) -     CMP14+EGFR -      CBC with Differential/Platelet  Mixed hyperlipidemia -     CMP14+EGFR -     CBC with Differential/Platelet -     Lipid panel  Hypothyroidism, unspecified type -     CMP14+EGFR -     CBC with Differential/Platelet -     TSH + free T4  Left hip pain -     CMP14+EGFR -     CBC with Differential/Platelet  Mild cognitive impairment -     CMP14+EGFR -     CBC with Differential/Platelet  Other orders -     Furosemide ; Take 1 tablet (20 mg total) by mouth daily.  Dispense: 90 tablet; Refill: 1 -     Gabapentin ; Take 2 capsules (600 mg total) by mouth at bedtime. For hip and thigh pain.  Dispense: 60 capsule; Refill: 2     Follow-up: Return in about 3 months (around 09/29/2023).  Roise Cleaver, M.D.

## 2023-07-01 LAB — CMP14+EGFR
ALT: 15 IU/L (ref 0–32)
AST: 22 IU/L (ref 0–40)
Albumin: 4.2 g/dL (ref 3.6–4.6)
Alkaline Phosphatase: 82 IU/L (ref 44–121)
BUN/Creatinine Ratio: 17 (ref 12–28)
BUN: 20 mg/dL (ref 10–36)
Bilirubin Total: 0.6 mg/dL (ref 0.0–1.2)
CO2: 24 mmol/L (ref 20–29)
Calcium: 9.6 mg/dL (ref 8.7–10.3)
Chloride: 104 mmol/L (ref 96–106)
Creatinine, Ser: 1.15 mg/dL — ABNORMAL HIGH (ref 0.57–1.00)
Globulin, Total: 2.2 g/dL (ref 1.5–4.5)
Glucose: 82 mg/dL (ref 70–99)
Potassium: 4.1 mmol/L (ref 3.5–5.2)
Sodium: 144 mmol/L (ref 134–144)
Total Protein: 6.4 g/dL (ref 6.0–8.5)
eGFR: 44 mL/min/{1.73_m2} — ABNORMAL LOW (ref >59)

## 2023-07-01 LAB — CBC WITH DIFFERENTIAL/PLATELET
Basophils Absolute: 0 10*3/uL (ref 0.0–0.2)
Basos: 1 %
EOS (ABSOLUTE): 0.1 10*3/uL (ref 0.0–0.4)
Eos: 1 %
Hematocrit: 37.3 % (ref 34.0–46.6)
Hemoglobin: 12.3 g/dL (ref 11.1–15.9)
Immature Grans (Abs): 0.1 10*3/uL (ref 0.0–0.1)
Immature Granulocytes: 1 %
Lymphocytes Absolute: 1.4 10*3/uL (ref 0.7–3.1)
Lymphs: 21 %
MCH: 31.9 pg (ref 26.6–33.0)
MCHC: 33 g/dL (ref 31.5–35.7)
MCV: 97 fL (ref 79–97)
Monocytes Absolute: 0.5 10*3/uL (ref 0.1–0.9)
Monocytes: 8 %
Neutrophils Absolute: 4.7 10*3/uL (ref 1.4–7.0)
Neutrophils: 68 %
Platelets: 203 10*3/uL (ref 150–450)
RBC: 3.86 x10E6/uL (ref 3.77–5.28)
RDW: 14.2 % (ref 11.7–15.4)
WBC: 6.8 10*3/uL (ref 3.4–10.8)

## 2023-07-01 LAB — LIPID PANEL
Cholesterol, Total: 174 mg/dL (ref 100–199)
HDL: 79 mg/dL (ref >39)
LDL CALC COMMENT:: 2.2 ratio (ref 0.0–4.4)
LDL Chol Calc (NIH): 77 mg/dL (ref 0–99)
Triglycerides: 102 mg/dL (ref 0–149)
VLDL Cholesterol Cal: 18 mg/dL (ref 5–40)

## 2023-07-01 LAB — TSH+FREE T4
Free T4: 1.27 ng/dL (ref 0.82–1.77)
TSH: 2.61 u[IU]/mL (ref 0.450–4.500)

## 2023-07-01 NOTE — Telephone Encounter (Signed)
 LMOVM handicap form is ready

## 2023-07-05 NOTE — Progress Notes (Signed)
Hello [name],  Your lab result is normal and/or stable.Some minor variations that are not significant are commonly marked abnormal, but do not represent any medical problem for you.  Best regards, Taeko Schaffer, M.D.

## 2023-07-20 ENCOUNTER — Other Ambulatory Visit: Payer: Self-pay | Admitting: Cardiovascular Disease

## 2023-07-20 DIAGNOSIS — I480 Paroxysmal atrial fibrillation: Secondary | ICD-10-CM

## 2023-07-20 NOTE — Telephone Encounter (Signed)
 Prescription refill request for Eliquis  received. Indication:afib Last office visit:1/25 Scr:1.15  4/25 Age: 89 Weight:44.9  kg  Prescription refilled

## 2023-08-20 ENCOUNTER — Telehealth: Payer: Self-pay

## 2023-08-20 NOTE — Telephone Encounter (Signed)
   Pre-operative Risk Assessment    Patient Name: [name]  DOB: 06-Aug-1928 MRN: [id number]   Date of last office visit: 03/18/23 Janelle Mediate, MD Date of next office visit: 09/14/23 Janelle Mediate, MD   Request for Surgical Clearance    Procedure:  LEFT L3-L4 TRANSFORAMINAL EPIDURAL STEROID INJECTION  Date of Surgery:  Clearance 09/01/23                                Surgeon:  DR [facility] Surgeon's Group or Practice Name:  [facility] Phone number:  [phone] Fax number:  [phone]   Type of Clearance Requested:   - Medical  - Pharmacy:  Hold Apixaban  (Eliquis ) 3 DAYS BEFORE   Type of Anesthesia:  Not Indicated   Additional requests/questions:    Signed, Collin Deal   08/20/2023, 9:13 AM

## 2023-08-20 NOTE — Telephone Encounter (Signed)
 Patient with diagnosis of PAF on Eliquis  for anticoagulation.    Procedure:  LEFT L3-L4 TRANSFORAMINAL EPIDURAL STEROID INJECTION  Date of procedure: 09/01/2023   CHA2DS2-VASc Score = 5   This indicates a 7.2% annual risk of stroke. The patient's score is based upon: CHF History: 1 HTN History: 1 Diabetes History: 0 Stroke History: 0 Vascular Disease History: 0 Age Score: 2 Gender Score: 1    CrCl 22 mL/min Platelet count 203 K  Patient has not had an Afib/aflutter ablation within the last 3 months or DCCV within the last 30 days  Per office protocol, patient can hold Eliquis  for 3 days prior to procedure.     **This guidance is not considered finalized until pre-operative APP has relayed final recommendations.**

## 2023-08-21 ENCOUNTER — Telehealth: Payer: Self-pay

## 2023-08-21 NOTE — Telephone Encounter (Signed)
 S/W Pts daughter and scheduled TELE Preop appt 08/26/23. Med rec and consent done... sam

## 2023-08-21 NOTE — Telephone Encounter (Signed)
 Med rec and consent done    Patient Consent for Virtual Visit        [name] has provided verbal consent on 08/21/2023 for a virtual visit (video or telephone).   CONSENT FOR VIRTUAL VISIT FOR:  [name]  By participating in this virtual visit I agree to the following:  I hereby voluntarily request, consent and authorize [facility] and its employed or contracted physicians, physician assistants, nurse practitioners or other licensed health care professionals (the Practitioner), to provide me with telemedicine health care services (the "Services) as deemed necessary by the treating Practitioner. I acknowledge and consent to receive the Services by the Practitioner via telemedicine. I understand that the telemedicine visit will involve communicating with the Practitioner through live audiovisual communication technology and the disclosure of certain medical information by electronic transmission. I acknowledge that I have been given the opportunity to request an in-person assessment or other available alternative prior to the telemedicine visit and am voluntarily participating in the telemedicine visit.  I understand that I have the right to withhold or withdraw my consent to the use of telemedicine in the course of my care at any time, without affecting my right to future care or treatment, and that the Practitioner or I may terminate the telemedicine visit at any time. I understand that I have the right to inspect all information obtained and/or recorded in the course of the telemedicine visit and may receive copies of available information for a reasonable fee.  I understand that some of the potential risks of receiving the Services via telemedicine include:  Delay or interruption in medical evaluation due to technological equipment failure or disruption; Information transmitted may not be sufficient (e.g. poor resolution of images) to allow for appropriate medical decision  making by the Practitioner; and/or  In rare instances, security protocols could fail, causing a breach of personal health information.  Furthermore, I acknowledge that it is my responsibility to provide information about my medical history, conditions and care that is complete and accurate to the best of my ability. I acknowledge that Practitioner's advice, recommendations, and/or decision may be based on factors not within their control, such as incomplete or inaccurate data provided by me or distortions of diagnostic images or specimens that may result from electronic transmissions. I understand that the practice of medicine is not an exact science and that Practitioner makes no warranties or guarantees regarding treatment outcomes. I acknowledge that a copy of this consent can be made available to me via my patient portal [facility] MyChart), or I can request a printed copy by calling the office of [facility].    I understand that my insurance will be billed for this visit.   I have read or had this consent read to me. I understand the contents of this consent, which adequately explains the benefits and risks of the Services being provided via telemedicine.  I have been provided ample opportunity to ask questions regarding this consent and the Services and have had my questions answered to my satisfaction. I give my informed consent for the services to be provided through the use of telemedicine in my medical care

## 2023-08-21 NOTE — Telephone Encounter (Signed)
 Primary Cardiologist:Peter Stann Earnest, MD   Preoperative team, please contact this patient and set up a phone call appointment for further preoperative risk assessment. Please obtain consent and complete medication review. Thank you for your help.   I confirm that guidance regarding antiplatelet and oral anticoagulation therapy has been completed and, if necessary, noted below.  Per office protocol, patient can hold Eliquis  for 3 days prior to procedure and should resume as soon as hemodynamically stable post procedure.  I also confirmed the patient resides in the state of [place] . As per [facility] Medical Board telemedicine laws, the patient must reside in the state in which the provider is licensed.   Gerldine Koch, NP-C  08/21/2023, 11:38 AM [address], [address] [place], [place] [address] Office [phone] Fax [phone]

## 2023-08-24 ENCOUNTER — Ambulatory Visit: Payer: PPO | Admitting: Cardiovascular Disease

## 2023-08-25 NOTE — Telephone Encounter (Signed)
 Received clearance request for a 2nd time.  Pt has TELE Preop appt: 08/26/23  Will update surgeons office.

## 2023-08-26 ENCOUNTER — Ambulatory Visit: Attending: Cardiology | Admitting: Student

## 2023-08-26 NOTE — Progress Notes (Signed)
 Virtual Visit via Telephone Note   Because of [name]'s co-morbid illnesses, she is at least at moderate risk for complications without adequate follow up.  This format is felt to be most appropriate for this patient at this time.  The patient did not have access to video technology/had technical difficulties with video requiring transitioning to audio format only (telephone).  All issues noted in this document were discussed and addressed.  No physical exam could be performed with this format.  Please refer to the patient's chart for her consent to telehealth for [facility].  Evaluation Performed:  Preoperative cardiovascular risk assessment _____________   Date:  08/26/2023   Patient ID:  [name], DOB 10/02/1928, MRN [id number] Patient Location:  Home Provider location:   Office  Primary Care Provider:  Roise Cleaver, MD Primary Cardiologist:  Janelle Mediate, MD  Chief Complaint / Patient Profile   89 y.o. y/o female with a h/o chronic HFrEF, pulmonary hypertension, mitral valve disease s/p MVR, PAF on anticoagulation, ascending aortic aneurysm, hypertension, hyperlipidemia, hypothyroidism, CKD, mild cognitive impairment who is pending left L3-4 transforaminal ESI by Dr. Phares Brasher and presents today for telephonic preoperative cardiovascular risk assessment.  History of Present Illness    [name] is a 89 y.o. female who presents via audio/video conferencing for a telehealth visit today.  Pt was last seen in cardiology clinic on 03/18/2023 by Dr. Stann Earnest.  At that time [name] was stable from a cardiac standpoint.  The patient is now pending procedure as outlined above. Since her last visit, she is doing well. Patient denies shortness of breath, dyspnea on exertion, lower extremity edema, orthopnea or PND. No chest pain, pressure, or tightness. No palpitations. She is independent with ADLs. She is able to perform light to moderate household activities such as  dusting, washing dishes, sweeping, and doing laundry.   Past Medical History    Past Medical History:  Diagnosis Date   Arthritis    Atrial fibrillation (HCC)    postoperative   COVID-19    CVA (cerebral vascular accident) (HCC)    HTN (hypertension)    MR (mitral regurgitation)    severe   NSTEMI (non-ST elevated myocardial infarction) (HCC)    Orthostatic hypotension 11/06/2011   Pericarditis    postoperative   Past Surgical History:  Procedure Laterality Date   ANTERIOR AND POSTERIOR VAGINAL REPAIR  10/15/04   Dr. Andree Kayser    anterior repair with perigee graft  04/16/06   posterior repair with apogee graft; lynx mid urethral sling; sacrospinous ligamnet suspension and cystoscopy; Dr. Tonlbin   MITRAL VALVE REPLACEMENT     sacral spinous ligament suspension of vagina     suprapubic cystectomy     TOTAL ABDOMINAL HYSTERECTOMY W/ BILATERAL SALPINGOOPHORECTOMY      Allergies  Allergies  Allergen Reactions   Donepezil  Other (See Comments)    bradycardia   Actonel  [Risedronate  Sodium] Nausea And Vomiting   Aspirin -Caffeine Other (See Comments)    Upset stomach    Codeine Other (See Comments)    Upset stomach   Memantine  Other (See Comments)    Unknown    Penicillins Other (See Comments)    Unknown    Sulfonamide Derivatives Other (See Comments)    Swollen tongue    Home Medications    Prior to Admission medications   Medication Sig Start Date End Date Taking? Authorizing Provider  acetaminophen  (TYLENOL ) 500 MG tablet Take 2 tablets (1,000 mg total) by  mouth 3 (three) times daily. 04/01/23   Roise Cleaver, MD  amiodarone  (PACERONE ) 200 MG tablet Take 1 tablet (200 mg total) by mouth daily. 01/28/23   Loyde Rule, MD  apixaban  (ELIQUIS ) 2.5 MG TABS tablet Take 1 tablet by mouth twice daily 07/20/23   Nishan, Peter C, MD  atorvastatin  (LIPITOR) 10 MG tablet Take 1 tablet by mouth once daily 01/13/23   Nishan, Peter C, MD  Biotin 1000 MCG tablet Take 1,000 mcg by mouth  daily.    [provider]  cholecalciferol 25 MCG (1000 UT) tablet Take 1,000 Units by mouth daily.    [provider]  eplerenone  (INSPRA ) 25 MG tablet Take 1 tablet by mouth once daily 05/22/23   Nishan, Peter C, MD  furosemide  (LASIX ) 20 MG tablet Take 1 tablet (20 mg total) by mouth daily. 06/30/23   Roise Cleaver, MD  gabapentin  (NEURONTIN ) 300 MG capsule Take 2 capsules (600 mg total) by mouth at bedtime. For hip and thigh pain. 06/30/23   Roise Cleaver, MD  lidocaine  (LIDODERM ) 5 % Place 1 patch onto the skin daily. Remove & Discard patch within 12 hours or as directed by MD 03/24/23   Anton Baton, NP  valsartan  (DIOVAN ) 80 MG tablet Take 1 tablet (80 mg total) by mouth daily. 01/05/23   Roise Cleaver, MD    Physical Exam    Vital Signs:  [name] does not have vital signs available for review today.  Given telephonic nature of communication, physical exam is limited. AAOx3. NAD. Normal affect.  Speech and respirations are unlabored.   Assessment & Plan    Primary Cardiologist: Janelle Mediate, MD  Preoperative cardiovascular risk assessment.  Left L3-4 transforaminal ESI by Dr. Phares Brasher on 09/01/2023.  Chart reviewed as part of pre-operative protocol coverage. According to the RCRI, patient has a 0.9-6% risk of MACE. Patient reports activity equivalent to 4.0 METS (light to moderate household activities).   Given past medical history and time since last visit, based on ACC/AHA guidelines, [name] would be at acceptable risk for the planned procedure without further cardiovascular testing.   Patient was advised that if she develops new symptoms prior to surgery to contact our office to arrange a follow-up appointment.  she verbalized understanding.  Per Pharm D, patient has not had an Afib/aflutter ablation within the last 3 months or DCCV within the last 30 days. Patient may hold Eliquis  for 3 days prior to procedure.    I will route this  recommendation to the requesting party via Epic fax function.  Please call with questions.  Time:   Today, I have spent 6 minutes with the patient with telehealth technology discussing medical history, symptoms, and management plan.     Morey Ar, NP  08/26/2023, 8:21 AM

## 2023-09-07 NOTE — Progress Notes (Signed)
 Patient ID: [name], female   DOB: February 03, 1929, 89 y.o.   MRN: [id number]      89 y.o. f/u post MV repair TTE 01/12/19 showed trace MR with low diastolic gradients and normal EF 60%.  She has HTN on ARB More dizziness on 50 mg Cozaar  dose decreased. She thinks statin Makes her feel worse and now taking Qod.  Some LE varicosities   Depression continues. Husband [name] was a patient of mine and died of lung cancer 6 years ago They were married 60 years. Shortly after that [name] died tragically in there back yard of a chain saw accident. Son [name] living with her and two daughters near by.   Has had some memory issues and has been on namenda  and aricept   Hospitalized 09/27/20 with COVID causing afib and new DCM with TTE showing EF 30-35% MV repair fine with trivial MR no MS mean gradient 4 mmhg  Converted with amiodarone  to slow sinus and aricept  d/c Added Toprol , ARB and eliquis  2.5 bid  TTE 01/10/21 EF improved 35-40%   December 2022 more dyspnea Primary ? Sinusitis and started her on Augmentin  Cough with sputum rhiorrhea and sore throat with cough and confusion from Namenda /Aricept    Called office again 11/25/20 complaining of dyspnea and more rapid HR;s  She had CT 04/17/21 showing bronchiectasis with mucous plugging in RML and multifocal infiltrates Seen by pulmonary Dr Jude 05/15/21 thought to be improving but appears to have bronchiectasis and MAI  11/28/21:  in rapid fib/flutter She clearly gets dyspnea and fatigue with minimum walking She was started on amiodarone  and converted when seen in f/u at Afib clinic on 01/08/22   05/17/22-05/20/22 Admitted with CHF and afib diuresed and rate control with iv digoxin  and lopressor  changed to coreg  Sent home on lasix  20 mg daily and amiodarone  200 mg Readmitted 3/17 with azotemia and Cr 3.3 Benicar  d/c and sent home on lasix  20 mg daily again BNP elevated 07/03/22 elevated 1327 Inspra  added 06/04/22   Primary wanted to stop diuretic and start beta  blocker again Not indicated as she has had bradycardia and still needs diuretic for CHF and elevated BNP Appears that Dr Zollie started her on Diovan  80 mg 01/05/23 Labs at that time with Cr 1.33 and K 4.3   She has left hip pain since last October. Had steroid injection recently that did not help xray 03/30/23 no acute pathology  Was to have left L3-4 trans foraminal ESI with Dr Laqueta told to hold eliquis  3 days prior to back surgery She has not had f/u with MD since MRI done no results in Epic    BP (!) 140/80   Pulse 67   Ht 5' 3.5 (1.613 m)   Wt 99 lb 3.2 oz (45 kg)   SpO2 96%   BMI 17.30 kg/m   Affect appropriate Healthy:  appears stated age HEENT: normal Neck supple with no adenopathy JVP normal no bruits no thyromegaly Lungs clear with no wheezing and good diaphragmatic motion Heart:  S1/S2 no murmur, no rub, gallop or click PMI normal  Post sternotomy  Abdomen: benighn, BS positve, no tenderness, no AAA no bruit.  No HSM or HJR Distal pulses intact with no bruits No edema Neuro non-focal Skin warm and dry No muscular weakness   Current Outpatient Medications  Medication Sig Dispense Refill   acetaminophen  (TYLENOL ) 500 MG tablet Take 2 tablets (1,000 mg total) by mouth 3 (three) times daily.     amiodarone  (  PACERONE ) 200 MG tablet Take 1 tablet (200 mg total) by mouth daily. 90 tablet 2   apixaban  (ELIQUIS ) 2.5 MG TABS tablet Take 1 tablet by mouth twice daily 180 tablet 1   atorvastatin  (LIPITOR) 10 MG tablet Take 1 tablet by mouth once daily 90 tablet 2   Biotin 1000 MCG tablet Take 1,000 mcg by mouth daily.     cholecalciferol 25 MCG (1000 UT) tablet Take 1,000 Units by mouth daily.     eplerenone  (INSPRA ) 25 MG tablet Take 1 tablet by mouth once daily 90 tablet 3   furosemide  (LASIX ) 20 MG tablet Take 1 tablet (20 mg total) by mouth daily. 90 tablet 1   gabapentin  (NEURONTIN ) 300 MG capsule Take 2 capsules (600 mg total) by mouth at bedtime. For hip and thigh  pain. 60 capsule 2   HYDROcodone -acetaminophen  (NORCO/VICODIN) 5-325 MG tablet Take 0.5 tablets by mouth every 6 (six) hours.     lidocaine  (LIDODERM ) 5 % Place 1 patch onto the skin daily. Remove & Discard patch within 12 hours or as directed by MD (Patient taking differently: Place 1 patch onto the skin as needed. Remove & Discard patch within 12 hours or as directed by MD) 30 patch 0   valsartan  (DIOVAN ) 80 MG tablet Take 1 tablet (80 mg total) by mouth daily. 90 tablet 3   No current facility-administered medications for this visit.    Allergies  Donepezil , Actonel  [risedronate  sodium], Aspirin -caffeine, Codeine, Memantine , Penicillins, and Sulfonamide derivatives  Electrocardiogram:   12/02/17  SR rate 62 LAD RBBB 09/14/2023 SR rate 58 RBBB/LAD  09/14/2023 afib/flutter rate 122 RBBB LAD   Assessment and Plan  MV Repair:  SBE prophylaxis discussed  Echo 05/18/22 repair intact mild MR    HTN: Primary added diovan  01/05/23 has had azotemia with ARB in past update BMET/BNP   Thyroid :  On replacement TSH  3.15 normal 01/05/23   PAF: continue low dose eliquis  given age weight 45 kg and elevated recent Cr with azotemia Continue amiodarone  Beta blocker d/c due to sinus pauses   DCM:  EF stable TTE 05/18/22 30-35% beta blocker d/c due to SSS and pauses ARB d/c due to azotemia and markedly increase Cr Continue current dose of lasix  and Inspra  06/30/23 K 4.1 Cr 1.15   Memory: Aricept  d/c in hospital due to bradycardia Sent Dr Zollie a message never to restart and she appears to be having side effects from Namenda  also d/c   HLD:  continue liptor   Ortho:  Sees Dr Laqueta for L34 pain limited relief with injection MRI pending results  Ok to have back surgery Hold eliquis  3 days prior   F/U in 6 months   [name]

## 2023-09-14 ENCOUNTER — Ambulatory Visit: Attending: Cardiovascular Disease | Admitting: Cardiovascular Disease

## 2023-09-14 ENCOUNTER — Encounter: Payer: Self-pay | Admitting: Cardiovascular Disease

## 2023-09-14 VITALS — BP 140/80 | HR 67 | Ht 63.5 in | Wt 99.2 lb

## 2023-09-14 DIAGNOSIS — Z01810 Encounter for preprocedural cardiovascular examination: Secondary | ICD-10-CM

## 2023-09-14 DIAGNOSIS — I420 Dilated cardiomyopathy: Secondary | ICD-10-CM | POA: Diagnosis not present

## 2023-09-14 DIAGNOSIS — Z9889 Other specified postprocedural states: Secondary | ICD-10-CM | POA: Diagnosis not present

## 2023-09-14 DIAGNOSIS — I480 Paroxysmal atrial fibrillation: Secondary | ICD-10-CM

## 2023-09-14 NOTE — Patient Instructions (Signed)

## 2023-09-30 ENCOUNTER — Ambulatory Visit: Admitting: Family Medicine

## 2023-10-01 ENCOUNTER — Encounter: Payer: Self-pay | Admitting: Family Medicine

## 2023-10-01 ENCOUNTER — Ambulatory Visit (INDEPENDENT_AMBULATORY_CARE_PROVIDER_SITE_OTHER): Admitting: Family Medicine

## 2023-10-01 VITALS — BP 153/72 | HR 69 | Temp 98.5°F | Ht 63.5 in | Wt 99.0 lb

## 2023-10-01 DIAGNOSIS — I480 Paroxysmal atrial fibrillation: Secondary | ICD-10-CM

## 2023-10-01 DIAGNOSIS — M160 Bilateral primary osteoarthritis of hip: Secondary | ICD-10-CM

## 2023-10-01 DIAGNOSIS — E039 Hypothyroidism, unspecified: Secondary | ICD-10-CM

## 2023-10-01 DIAGNOSIS — D500 Iron deficiency anemia secondary to blood loss (chronic): Secondary | ICD-10-CM

## 2023-10-01 DIAGNOSIS — E782 Mixed hyperlipidemia: Secondary | ICD-10-CM | POA: Diagnosis not present

## 2023-10-01 DIAGNOSIS — I10 Essential (primary) hypertension: Secondary | ICD-10-CM | POA: Diagnosis not present

## 2023-10-01 DIAGNOSIS — G3184 Mild cognitive impairment, so stated: Secondary | ICD-10-CM

## 2023-10-01 NOTE — Progress Notes (Signed)
 Subjective:  Patient ID: [name], female    DOB: 01-Oct-1928  Age: 89 y.o. MRN: [id number]  CC: Medical Management of Chronic Issues (Basal cell removed from scalp on the 16th. Healing well. ) and Alopecia (Can joint injections cause hair loss? Increased since starting. )   HPI [name] presents for  follow-up on  thyroid . The patient has a history of hypothyroidism for many years. It has been stable recently. Pt. denies any change in  voice, loss of hair, heat or cold intolerance. Energy level has been adequate to good. Patient denies constipation and diarrhea. No myxedema. Medication is as noted below. Verified that pt is taking it daily on an empty stomach. Well tolerated.   presents for  follow-up of hypertension. Patient has no history of headache chest pain or shortness of breath or recent cough. Patient also denies symptoms of TIA such as focal numbness or weakness. Patient denies side effects from medication. States taking it regularly.  Atrial fibrillation follow up. Pt. is treated with rate control and anticoagulation. Pt.  denies palpitations, rapid rate, chest pain, dyspnea and edema. There has been no bleeding from nose or gums. Pt. has not noticed blood with urine or stool.  Although there is routine bruising easily, it is not excessive.   in for follow-up of elevated cholesterol. Doing well without complaints on current medication. Denies side effects of statin including myalgia and arthralgia and nausea. Currently no chest pain, shortness of breath or other cardiovascular related symptoms noted.         04/20/2023   11:19 AM 04/01/2023    2:41 PM 03/27/2023    4:16 PM  Depression screen PHQ 2/9  Decreased Interest 0 0 0  Down, Depressed, Hopeless 0 0 0  PHQ - 2 Score 0 0 0  Altered sleeping 0 0 0  Tired, decreased energy  0 0  Change in appetite  0 0  Feeling bad or failure about yourself   0 0  Trouble concentrating  0 0  Moving slowly or fidgety/restless  0 0   Suicidal thoughts  0 0  PHQ-9 Score 0 0 0  Difficult doing work/chores  Not difficult at all Not difficult at all    History [name] has a past medical history of Acute kidney injury superimposed on chronic kidney disease stage IIIa (HCC) (05/23/2022), Arthritis, Atrial fibrillation (HCC), COVID-19, CVA (cerebral vascular accident) (HCC), HTN (hypertension), MR (mitral regurgitation), NSTEMI (non-ST elevated myocardial infarction) (HCC), Orthostatic hypotension (11/06/2011), and Pericarditis.   She has a past surgical history that includes anterior repair with perigee graft (04/16/06); Anterior and posterior vaginal repair (10/15/04); sacral spinous ligament suspension of vagina; suprapubic cystectomy; Total abdominal hysterectomy w/ bilateral salpingoophorectomy; and Mitral valve replacement.   Her family history includes Colon cancer in her brother; Heart attack in her father; Heart disease in her brother, father, and mother; Lupus in her father.She reports that she has never smoked. She has never used smokeless tobacco. She reports that she does not drink alcohol and does not use drugs.    ROS Review of Systems  Constitutional: Negative.   HENT:  Negative for congestion.   Eyes:  Negative for visual disturbance.  Respiratory:  Negative for shortness of breath.   Cardiovascular:  Negative for chest pain.  Gastrointestinal:  Negative for abdominal pain, constipation, diarrhea, nausea and vomiting.  Genitourinary:  Negative for difficulty urinating.  Musculoskeletal:  Negative for arthralgias and myalgias.  Neurological:  Negative for headaches.  Psychiatric/Behavioral:  Negative for sleep disturbance.     Objective:  BP (!) 153/72   Pulse 69   Temp 98.5 F (36.9 C)   Ht 5' 3.5 (1.613 m)   Wt 99 lb (44.9 kg)   SpO2 96%   BMI 17.26 kg/m   BP Readings from Last 3 Encounters:  10/01/23 (!) 153/72  09/14/23 (!) 140/80  06/30/23 (!) 164/73    Wt Readings from Last 3 Encounters:   10/01/23 99 lb (44.9 kg)  09/14/23 99 lb 3.2 oz (45 kg)  06/30/23 99 lb (44.9 kg)     Physical Exam Constitutional:      General: She is not in acute distress.    Appearance: She is well-developed.  Cardiovascular:     Rate and Rhythm: Normal rate and regular rhythm.  Pulmonary:     Breath sounds: Normal breath sounds.  Musculoskeletal:        General: Normal range of motion.  Skin:    General: Skin is warm and dry.  Neurological:     Mental Status: She is alert and oriented to person, place, and time.      Assessment & Plan:  Bilateral hip joint arthritis  Essential hypertension  Mixed hyperlipidemia  Hypothyroidism, unspecified type  Iron deficiency anemia due to chronic blood loss  Mild cognitive impairment  Paroxysmal atrial fibrillation (HCC)     Follow-up: No follow-ups on file.  Butler Der, M.D.

## 2023-10-17 ENCOUNTER — Other Ambulatory Visit: Payer: Self-pay | Admitting: Cardiovascular Disease

## 2023-10-18 ENCOUNTER — Other Ambulatory Visit: Payer: Self-pay | Admitting: Cardiovascular Disease

## 2023-11-20 ENCOUNTER — Emergency Department (HOSPITAL_BASED_OUTPATIENT_CLINIC_OR_DEPARTMENT_OTHER): Admitting: Radiology

## 2023-11-20 ENCOUNTER — Other Ambulatory Visit: Payer: Self-pay

## 2023-11-20 ENCOUNTER — Ambulatory Visit: Payer: Self-pay

## 2023-11-20 ENCOUNTER — Encounter (HOSPITAL_BASED_OUTPATIENT_CLINIC_OR_DEPARTMENT_OTHER): Payer: Self-pay

## 2023-11-20 ENCOUNTER — Emergency Department (HOSPITAL_BASED_OUTPATIENT_CLINIC_OR_DEPARTMENT_OTHER)

## 2023-11-20 ENCOUNTER — Emergency Department (HOSPITAL_BASED_OUTPATIENT_CLINIC_OR_DEPARTMENT_OTHER)
Admission: EM | Admit: 2023-11-20 | Discharge: 2023-11-20 | Disposition: A | Discharge status: Home / Self Care | Source: Home / Self Care | Attending: Emergency Medicine | Admitting: Emergency Medicine

## 2023-11-20 DIAGNOSIS — Z7901 Long term (current) use of anticoagulants: Secondary | ICD-10-CM | POA: Insufficient documentation

## 2023-11-20 DIAGNOSIS — J181 Lobar pneumonia, unspecified organism: Secondary | ICD-10-CM | POA: Insufficient documentation

## 2023-11-20 DIAGNOSIS — R7989 Other specified abnormal findings of blood chemistry: Secondary | ICD-10-CM

## 2023-11-20 DIAGNOSIS — R945 Abnormal results of liver function studies: Secondary | ICD-10-CM | POA: Insufficient documentation

## 2023-11-20 DIAGNOSIS — J189 Pneumonia, unspecified organism: Secondary | ICD-10-CM

## 2023-11-20 DIAGNOSIS — S0012XA Contusion of left eyelid and periocular area, initial encounter: Secondary | ICD-10-CM | POA: Insufficient documentation

## 2023-11-20 DIAGNOSIS — S0990XA Unspecified injury of head, initial encounter: Secondary | ICD-10-CM

## 2023-11-20 DIAGNOSIS — M25552 Pain in left hip: Secondary | ICD-10-CM | POA: Insufficient documentation

## 2023-11-20 DIAGNOSIS — I4891 Unspecified atrial fibrillation: Secondary | ICD-10-CM | POA: Insufficient documentation

## 2023-11-20 DIAGNOSIS — R059 Cough, unspecified: Secondary | ICD-10-CM | POA: Insufficient documentation

## 2023-11-20 DIAGNOSIS — W19XXXA Unspecified fall, initial encounter: Secondary | ICD-10-CM

## 2023-11-20 DIAGNOSIS — S0083XA Contusion of other part of head, initial encounter: Secondary | ICD-10-CM | POA: Insufficient documentation

## 2023-11-20 DIAGNOSIS — W06XXXA Fall from bed, initial encounter: Secondary | ICD-10-CM | POA: Insufficient documentation

## 2023-11-20 LAB — COMPREHENSIVE METABOLIC PANEL WITH GFR
ALT: 109 U/L — ABNORMAL HIGH (ref 0–44)
AST: 131 U/L — ABNORMAL HIGH (ref 15–41)
Albumin: 4.1 g/dL (ref 3.5–5.0)
Alkaline Phosphatase: 76 U/L (ref 38–126)
Anion gap: 17 — ABNORMAL HIGH (ref 5–15)
BUN: 17 mg/dL (ref 8–23)
CO2: 22 mmol/L (ref 22–32)
Calcium: 9.4 mg/dL (ref 8.9–10.3)
Chloride: 96 mmol/L — ABNORMAL LOW (ref 98–111)
Creatinine, Ser: 1.23 mg/dL — ABNORMAL HIGH (ref 0.44–1.00)
GFR, Estimated: 41 mL/min — ABNORMAL LOW (ref >60)
Glucose, Bld: 135 mg/dL — ABNORMAL HIGH (ref 70–99)
Potassium: 3.4 mmol/L — ABNORMAL LOW (ref 3.5–5.1)
Sodium: 135 mmol/L (ref 135–145)
Total Bilirubin: 1 mg/dL (ref 0.0–1.2)
Total Protein: 6.8 g/dL (ref 6.5–8.1)

## 2023-11-20 LAB — CBC WITH DIFFERENTIAL/PLATELET
Abs Immature Granulocytes: 0.04 K/uL (ref 0.00–0.07)
Basophils Absolute: 0 K/uL (ref 0.0–0.1)
Basophils Relative: 0 %
Eosinophils Absolute: 0 K/uL (ref 0.0–0.5)
Eosinophils Relative: 0 %
HCT: 36.1 % (ref 36.0–46.0)
Hemoglobin: 11.6 g/dL — ABNORMAL LOW (ref 12.0–15.0)
Immature Granulocytes: 0 %
Lymphocytes Relative: 7 %
Lymphs Abs: 0.7 K/uL (ref 0.7–4.0)
MCH: 31.1 pg (ref 26.0–34.0)
MCHC: 32.1 g/dL (ref 30.0–36.0)
MCV: 96.8 fL (ref 80.0–100.0)
Monocytes Absolute: 0.6 K/uL (ref 0.1–1.0)
Monocytes Relative: 6 %
Neutro Abs: 9 K/uL — ABNORMAL HIGH (ref 1.7–7.7)
Neutrophils Relative %: 87 %
Platelets: 156 K/uL (ref 150–400)
RBC: 3.73 MIL/uL — ABNORMAL LOW (ref 3.87–5.11)
RDW: 14.6 % (ref 11.5–15.5)
WBC: 10.4 K/uL (ref 4.0–10.5)
nRBC: 0 % (ref 0.0–0.2)

## 2023-11-20 LAB — RESP PANEL BY RT-PCR (RSV, FLU A&B, COVID)  RVPGX2
Influenza A by PCR: NEGATIVE
Influenza B by PCR: NEGATIVE
Resp Syncytial Virus by PCR: NEGATIVE
SARS Coronavirus 2 by RT PCR: NEGATIVE

## 2023-11-20 LAB — LACTIC ACID, PLASMA: Lactic Acid, Venous: 1.1 mmol/L (ref 0.5–1.9)

## 2023-11-20 MED ORDER — GUAIFENESIN-CODEINE 100-10 MG/5ML PO SOLN: 5.0000 mL | Freq: Three times a day (TID) | ORAL | 0 refills | Status: DC | PRN

## 2023-11-20 MED ORDER — ACETAMINOPHEN 500 MG PO TABS
1000.0000 mg | ORAL_TABLET | Freq: Once | ORAL | Status: AC
  Administered 2023-11-20: 1000 mg via ORAL
  Filled 2023-11-20: qty 2

## 2023-11-20 MED ORDER — LEVOFLOXACIN 250 MG PO TABS
750.0000 mg | ORAL_TABLET | Freq: Every day | ORAL | 0 refills | Status: DC
Start: 2023-11-20 — End: 2023-11-27

## 2023-11-20 MED ORDER — LEVOFLOXACIN 750 MG PO TABS
750.0000 mg | ORAL_TABLET | ORAL | Status: AC
  Administered 2023-11-20: 750 mg via ORAL
  Filled 2023-11-20: qty 1

## 2023-11-20 NOTE — ED Provider Notes (Signed)
 [place] EMERGENCY DEPARTMENT AT [facility] Provider Note   CSN: [id number] Arrival date & time: 11/20/23  1735     Patient presents with: No chief complaint on file.   [name] is a 89 y.o. female.  {Add pertinent medical, surgical, social history, OB history to HPI:884} 89 year old female with a history of stroke and atrial fibrillation on Eliquis  who presents to the emergency department with a head injury.  Patient reports that Thursday morning at 2:30 AM was getting out of bed and slipped and fell and struck her head on the nightstand.  Says that she was only only down for approximately 30 minutes.  Is been having some left hip pain since then.  Had some bruising on the left side of her head.  Talk to her outpatient doctor today do that told her to come into the emergency department because of her blood thinner use.  Also has developed a cough over the past day.  No fevers or chills.  Productive of yellow sputum.  No shortness of breath.  No known sick contacts.       Prior to Admission medications   Medication Sig Start Date End Date Taking? Authorizing Provider  acetaminophen  (TYLENOL ) 500 MG tablet Take 2 tablets (1,000 mg total) by mouth 3 (three) times daily. 04/01/23   Zollie Lowers, MD  amiodarone  (PACERONE ) 200 MG tablet Take 1 tablet by mouth once daily 10/19/23   Nishan, Peter C, MD  apixaban  (ELIQUIS ) 2.5 MG TABS tablet Take 1 tablet by mouth twice daily 07/20/23   Nishan, Peter C, MD  atorvastatin  (LIPITOR) 10 MG tablet Take 1 tablet by mouth once daily 10/19/23   Nishan, Peter C, MD  Biotin 1000 MCG tablet Take 1,000 mcg by mouth daily.    [provider]  cholecalciferol 25 MCG (1000 UT) tablet Take 1,000 Units by mouth daily.    [provider]  eplerenone  (INSPRA ) 25 MG tablet Take 1 tablet by mouth once daily 05/22/23   Nishan, Peter C, MD  furosemide  (LASIX ) 20 MG tablet Take 1 tablet (20 mg total) by mouth daily. 06/30/23   Zollie Lowers, MD  gabapentin  (NEURONTIN ) 300 MG capsule Take 2 capsules (600 mg total) by mouth at bedtime. For hip and thigh pain. 06/30/23   Zollie Lowers, MD  HYDROcodone -acetaminophen  (NORCO/VICODIN) 5-325 MG tablet Take 0.5 tablets by mouth every 6 (six) hours. 08/28/23   [provider]  lidocaine  (LIDODERM ) 5 % Place 1 patch onto the skin daily. Remove & Discard patch within 12 hours or as directed by MD 03/24/23   Deitra Morton Sebastian Nena, NP  valsartan  (DIOVAN ) 80 MG tablet Take 1 tablet (80 mg total) by mouth daily. 01/05/23   Zollie Lowers, MD    Allergies: Donepezil , Actonel  [risedronate  sodium], Aspirin -caffeine, Codeine , Memantine , Penicillins, and Sulfonamide derivatives    Review of Systems  Updated Vital Signs BP (!) 159/62   Pulse 72   Temp 100 F (37.8 C) (Oral)   Resp (!) 28   SpO2 95%   Physical Exam Constitutional:      General: She is not in acute distress.    Appearance: Normal appearance. She is not ill-appearing.  HENT:     Head: Normocephalic.     Comments: Periorbital ecchymoses on the left eye.  Hematoma to occiput of the head.  Small abrasion with bleeding controlled.    Right Ear: External ear normal.     Left Ear: External ear normal.  Mouth/Throat:     Mouth: Mucous membranes are moist.     Pharynx: Oropharynx is clear.  Eyes:     Extraocular Movements: Extraocular movements intact.     Conjunctiva/sclera: Conjunctivae normal.     Pupils: Pupils are equal, round, and reactive to light.     Comments: Pupils 4 mm bilateral  Neck:     Comments: No C-spine midline tenderness to palpation Cardiovascular:     Rate and Rhythm: Normal rate and regular rhythm.     Pulses: Normal pulses.     Heart sounds: Normal heart sounds.  Pulmonary:     Effort: Pulmonary effort is normal. No respiratory distress.     Breath sounds: Rhonchi (Right lower lobe) present.  Musculoskeletal:        General: No deformity. Normal range of motion.     Cervical  back: No rigidity or tenderness.     Comments: No tenderness to palpation of midline thoracic or lumbar spine.  No step-offs palpated.  No tenderness to palpation of chest wall.  No bruising noted.  No tenderness to palpation of bilateral clavicles.  No tenderness to palpation, bruising, or deformities noted of bilateral shoulders, elbows, wrists, knees, or ankles.  Tenderness to palpation of left hip  Neurological:     Mental Status: She is alert and oriented to person, place, and time. Mental status is at baseline.     Cranial Nerves: No cranial nerve deficit.     Motor: No weakness.     (all labs ordered are listed, but only abnormal results are displayed) Labs Reviewed  CULTURE, BLOOD (ROUTINE X 2)  CULTURE, BLOOD (ROUTINE X 2)  RESP PANEL BY RT-PCR (RSV, FLU A&B, COVID)  [id number]  COMPREHENSIVE METABOLIC PANEL WITH GFR  LACTIC ACID, PLASMA  LACTIC ACID, PLASMA  CBC WITH DIFFERENTIAL/PLATELET    EKG: None  Radiology: No results found.  {Document cardiac monitor, telemetry assessment procedure when appropriate:32947} Procedures   Medications Ordered in the ED - No data to display    {Click here for ABCD2, HEART and other calculators REFRESH Note before signing:1}                              Medical Decision Making Amount and/or Complexity of Data Reviewed Labs: ordered. Radiology: ordered.   ***  {Document critical care time when appropriate  Document review of labs and clinical decision tools ie CHADS2VASC2, etc  Document your independent review of radiology images and any outside records  Document your discussion with family members, caretakers and with consultants  Document social determinants of health affecting pt's care  Document your decision making why or why not admission, treatments were needed:32947:::1}   Final diagnoses:  None    ED Discharge Orders     None

## 2023-11-20 NOTE — Telephone Encounter (Signed)
 Copied from CRM #[id number]. Topic: Clinical - Red Word Triage >> Nov 20, 2023  2:58 PM Myrick T wrote: Red Word that prompted transfer to Nurse Triage: patients daughter Verneita called stated patient fell on Wednesday night sliding down on her right hip and she is really sore. She does not think she broke anything but needs to be seen. Daughter also stated she has a bad cough as she stays cold all the time Reason for Disposition  [1] Taking Coumadin (warfarin) or other strong blood thinner AND [2] falling is a recurrent problem    Nurse judgement-advised ED, no available appts, fall/ head 11/18/23  Answer Assessment - Initial Assessment Questions No available appts. Advised ED. Fall/Head injury 11/18/23, taking Eliquis . Pt's daughter reports will take to ED.  Pt's daughter reports 1. MECHANISM: How did the fall happen?     Getting into the bed, slid and fell down, hit right hip and back of  head; Feels sore. Reports she is sitting down more, due to soreness. 2. DOMESTIC VIOLENCE AND ELDER ABUSE SCREENING: Did you fall because someone pushed you or tried to hurt you? If Yes, ask: Are you safe now?     no 3. ONSET: When did the fall happen? (e.g., minutes, hours, or days ago)     [facility] Wednesday 4. LOCATION: What part of the body hit the ground? (e.g., back, buttocks, head, hips, knees, hands, head, stomach)     Head (back of head) and right hip 5. INJURY: Did you hurt (injure) yourself when you fell? If Yes, ask: What did you injure? Tell me more about this? (e.g., body area; type of injury; pain severity)     Trying to get on bed 6. PAIN: Is there any pain? If Yes, ask: How bad is the pain? (e.g., Scale 0-10; or none, mild,  Unable to assess, spoke with pt's daughter      7. SIZE: For cuts, bruises, or swelling, ask: How large is it? (e.g., inches or centimeters) bruising on arm and hand, right side of eye is bruised.      9. OTHER SYMPTOMS: Do you have any other symptoms?  (e.g., dizziness, fever, weakness; new-onset or worsening).      Hacking cough-dry cough; Delsium, Robitussin DM,  denies fever,numbness/weakness, feeling faint. Pt's daughter reports no change in behavior or confusion, reports alert and oriented 10. CAUSE: What do you think caused the fall (or falling)? (e.g., dizzy spell, tripped)       Denies dizziness, headache, n/v/d, clear fluids/ bleeding from nose or ears, no lump on head Denies loss of conscious after fall  Protocols used: Falls [facility]

## 2023-11-20 NOTE — ED Triage Notes (Signed)
 Pt w family, advises she fell yesterday morning approx 0230. Bruising noted to L periorbital area, hematoma to L side of posterior head.  Pt presents w productive cough, advises she got it at another office.

## 2023-11-20 NOTE — Discharge Instructions (Signed)
 You were seen for your pneumonia and fall in the emergency department.   At home, please take the antibiotics (Levaquin ).  Take Tylenol  for any fevers.  Take the cough syrup we have given your tea with honey for any cough.  Please note that the cough syrup may make you drowsy so it may be better to take at night  Check your MyChart online for the results of any tests that had not resulted by the time you left the emergency department.   Follow-up with your primary doctor in 2-3 days regarding your visit.    Return immediately to the emergency department if you experience any of the following: Difficulty breathing, severe weakness, or any other concerning symptoms.    Thank you for visiting our Emergency Department. It was a pleasure taking care of you today.

## 2023-11-21 ENCOUNTER — Telehealth (HOSPITAL_BASED_OUTPATIENT_CLINIC_OR_DEPARTMENT_OTHER): Payer: Self-pay | Admitting: Emergency Medicine

## 2023-11-21 MED ORDER — DM-GUAIFENESIN ER 30-600 MG PO TB12: 1.0000 | ORAL_TABLET | Freq: Two times a day (BID) | ORAL | 0 refills | Status: AC | PRN

## 2023-11-21 NOTE — Telephone Encounter (Signed)
 I was contacted in regards to this patient's prescription for guaifenesin  and codeine , with a listed allergy as upset stomach to codeine .  With this listed side effect to codeine  I have changed the prescription to generic Mucinex  DM.

## 2023-11-21 NOTE — ED Notes (Signed)
 Meagan from [facility] called and advised we prescribed the patient to take Guaisenesin/codeine  and the patient has an allergy to codeine  and would like something else called in.  I s/w Dr. Bari and she stated  says the allergy is upset stomach which is prob why it was still prescribed. I can change it to regular mucinex .  I called and updated the pharmacy.

## 2023-11-22 ENCOUNTER — Emergency Department (HOSPITAL_COMMUNITY)

## 2023-11-22 ENCOUNTER — Encounter (HOSPITAL_COMMUNITY): Payer: Self-pay | Admitting: *Deleted

## 2023-11-22 ENCOUNTER — Inpatient Hospital Stay (HOSPITAL_COMMUNITY)
Admission: EM | Admit: 2023-11-22 | Discharge: 2023-11-27 | DRG: 193 | Disposition: A | Discharge status: Skilled Nursing Facility | Attending: Family Medicine | Admitting: Family Medicine

## 2023-11-22 ENCOUNTER — Other Ambulatory Visit: Payer: Self-pay

## 2023-11-22 DIAGNOSIS — Z885 Allergy status to narcotic agent status: Secondary | ICD-10-CM | POA: Diagnosis not present

## 2023-11-22 DIAGNOSIS — Z7901 Long term (current) use of anticoagulants: Secondary | ICD-10-CM | POA: Diagnosis not present

## 2023-11-22 DIAGNOSIS — E785 Hyperlipidemia, unspecified: Secondary | ICD-10-CM | POA: Diagnosis present

## 2023-11-22 DIAGNOSIS — N1831 Chronic kidney disease, stage 3a: Secondary | ICD-10-CM | POA: Diagnosis present

## 2023-11-22 DIAGNOSIS — R339 Retention of urine, unspecified: Secondary | ICD-10-CM | POA: Diagnosis present

## 2023-11-22 DIAGNOSIS — G9341 Metabolic encephalopathy: Secondary | ICD-10-CM | POA: Diagnosis present

## 2023-11-22 DIAGNOSIS — Z66 Do not resuscitate: Secondary | ICD-10-CM | POA: Diagnosis present

## 2023-11-22 DIAGNOSIS — I252 Old myocardial infarction: Secondary | ICD-10-CM | POA: Diagnosis not present

## 2023-11-22 DIAGNOSIS — I5022 Chronic systolic (congestive) heart failure: Secondary | ICD-10-CM | POA: Diagnosis present

## 2023-11-22 DIAGNOSIS — Z952 Presence of prosthetic heart valve: Secondary | ICD-10-CM | POA: Diagnosis not present

## 2023-11-22 DIAGNOSIS — Z8673 Personal history of transient ischemic attack (TIA), and cerebral infarction without residual deficits: Secondary | ICD-10-CM

## 2023-11-22 DIAGNOSIS — R531 Weakness: Secondary | ICD-10-CM

## 2023-11-22 DIAGNOSIS — Z880 Allergy status to penicillin: Secondary | ICD-10-CM

## 2023-11-22 DIAGNOSIS — E871 Hypo-osmolality and hyponatremia: Secondary | ICD-10-CM | POA: Diagnosis present

## 2023-11-22 DIAGNOSIS — J189 Pneumonia, unspecified organism: Secondary | ICD-10-CM | POA: Diagnosis present

## 2023-11-22 DIAGNOSIS — I130 Hypertensive heart and chronic kidney disease with heart failure and stage 1 through stage 4 chronic kidney disease, or unspecified chronic kidney disease: Secondary | ICD-10-CM | POA: Diagnosis present

## 2023-11-22 DIAGNOSIS — F419 Anxiety disorder, unspecified: Secondary | ICD-10-CM | POA: Diagnosis present

## 2023-11-22 DIAGNOSIS — Z8249 Family history of ischemic heart disease and other diseases of the circulatory system: Secondary | ICD-10-CM

## 2023-11-22 DIAGNOSIS — E876 Hypokalemia: Secondary | ICD-10-CM | POA: Diagnosis present

## 2023-11-22 DIAGNOSIS — G4700 Insomnia, unspecified: Secondary | ICD-10-CM | POA: Diagnosis present

## 2023-11-22 DIAGNOSIS — I480 Paroxysmal atrial fibrillation: Secondary | ICD-10-CM | POA: Diagnosis present

## 2023-11-22 DIAGNOSIS — Z79899 Other long term (current) drug therapy: Secondary | ICD-10-CM

## 2023-11-22 DIAGNOSIS — Z8616 Personal history of COVID-19: Secondary | ICD-10-CM | POA: Diagnosis not present

## 2023-11-22 DIAGNOSIS — N179 Acute kidney failure, unspecified: Secondary | ICD-10-CM | POA: Diagnosis not present

## 2023-11-22 DIAGNOSIS — Z882 Allergy status to sulfonamides status: Secondary | ICD-10-CM

## 2023-11-22 DIAGNOSIS — I10 Essential (primary) hypertension: Secondary | ICD-10-CM | POA: Diagnosis present

## 2023-11-22 DIAGNOSIS — I351 Nonrheumatic aortic (valve) insufficiency: Secondary | ICD-10-CM | POA: Diagnosis present

## 2023-11-22 DIAGNOSIS — E782 Mixed hyperlipidemia: Secondary | ICD-10-CM | POA: Diagnosis not present

## 2023-11-22 DIAGNOSIS — Z8269 Family history of other diseases of the musculoskeletal system and connective tissue: Secondary | ICD-10-CM

## 2023-11-22 DIAGNOSIS — Z888 Allergy status to other drugs, medicaments and biological substances status: Secondary | ICD-10-CM

## 2023-11-22 HISTORY — DX: Pneumonia, unspecified organism: J18.9

## 2023-11-22 LAB — BASIC METABOLIC PANEL WITH GFR
Anion gap: 13 (ref 5–15)
BUN: 22 mg/dL (ref 8–23)
CO2: 23 mmol/L (ref 22–32)
Calcium: 9 mg/dL (ref 8.9–10.3)
Chloride: 98 mmol/L (ref 98–111)
Creatinine, Ser: 1.27 mg/dL — ABNORMAL HIGH (ref 0.44–1.00)
GFR, Estimated: 39 mL/min — ABNORMAL LOW (ref >60)
Glucose, Bld: 108 mg/dL — ABNORMAL HIGH (ref 70–99)
Potassium: 3.3 mmol/L — ABNORMAL LOW (ref 3.5–5.1)
Sodium: 134 mmol/L — ABNORMAL LOW (ref 135–145)

## 2023-11-22 LAB — CBC WITH DIFFERENTIAL/PLATELET
Abs Immature Granulocytes: 0.06 K/uL (ref 0.00–0.07)
Basophils Absolute: 0 K/uL (ref 0.0–0.1)
Basophils Relative: 0 %
Eosinophils Absolute: 0 K/uL (ref 0.0–0.5)
Eosinophils Relative: 0 %
HCT: 33.3 % — ABNORMAL LOW (ref 36.0–46.0)
Hemoglobin: 11 g/dL — ABNORMAL LOW (ref 12.0–15.0)
Immature Granulocytes: 1 %
Lymphocytes Relative: 9 %
Lymphs Abs: 0.6 K/uL — ABNORMAL LOW (ref 0.7–4.0)
MCH: 31 pg (ref 26.0–34.0)
MCHC: 33 g/dL (ref 30.0–36.0)
MCV: 93.8 fL (ref 80.0–100.0)
Monocytes Absolute: 0.5 K/uL (ref 0.1–1.0)
Monocytes Relative: 8 %
Neutro Abs: 5.2 K/uL (ref 1.7–7.7)
Neutrophils Relative %: 82 %
Platelets: 170 K/uL (ref 150–400)
RBC: 3.55 MIL/uL — ABNORMAL LOW (ref 3.87–5.11)
RDW: 14.2 % (ref 11.5–15.5)
WBC: 6.4 K/uL (ref 4.0–10.5)
nRBC: 0 % (ref 0.0–0.2)

## 2023-11-22 MED ORDER — ACETAMINOPHEN 325 MG PO TABS
650.0000 mg | ORAL_TABLET | Freq: Four times a day (QID) | ORAL | Status: DC | PRN
  Administered 2023-11-27: 650 mg via ORAL
  Filled 2023-11-22: qty 2

## 2023-11-22 MED ORDER — GABAPENTIN 300 MG PO CAPS
600.0000 mg | ORAL_CAPSULE | Freq: Once | ORAL | Status: AC
  Administered 2023-11-22: 600 mg via ORAL

## 2023-11-22 MED ORDER — ONDANSETRON HCL 4 MG PO TABS: 4.0000 mg | ORAL_TABLET | Freq: Four times a day (QID) | ORAL | Status: DC | PRN

## 2023-11-22 MED ORDER — EPLERENONE 25 MG PO TABS
25.0000 mg | ORAL_TABLET | Freq: Every day | ORAL | Status: DC
Start: 2023-11-23 — End: 2023-11-24
  Filled 2023-11-22 (×2): qty 1

## 2023-11-22 MED ORDER — GABAPENTIN 300 MG PO CAPS
600.0000 mg | ORAL_CAPSULE | Freq: Every day | ORAL | Status: DC
  Administered 2023-11-23 – 2023-11-26 (×4): 600 mg via ORAL
  Filled 2023-11-22 (×5): qty 2

## 2023-11-22 MED ORDER — IRBESARTAN 75 MG PO TABS
37.5000 mg | ORAL_TABLET | Freq: Every day | ORAL | Status: DC
  Administered 2023-11-22: 37.5 mg via ORAL
  Filled 2023-11-22: qty 1

## 2023-11-22 MED ORDER — AMIODARONE HCL 200 MG PO TABS
200.0000 mg | ORAL_TABLET | Freq: Every day | ORAL | Status: DC
  Administered 2023-11-22 – 2023-11-27 (×6): 200 mg via ORAL
  Filled 2023-11-22 (×6): qty 1

## 2023-11-22 MED ORDER — ONDANSETRON HCL 4 MG/2ML IJ SOLN: 4.0000 mg | Freq: Four times a day (QID) | INTRAMUSCULAR | Status: DC | PRN

## 2023-11-22 MED ORDER — APIXABAN 2.5 MG PO TABS
2.5000 mg | ORAL_TABLET | Freq: Two times a day (BID) | ORAL | Status: DC
  Administered 2023-11-22 – 2023-11-27 (×10): 2.5 mg via ORAL
  Filled 2023-11-22 (×10): qty 1

## 2023-11-22 MED ORDER — ATORVASTATIN CALCIUM 10 MG PO TABS
10.0000 mg | ORAL_TABLET | Freq: Every day | ORAL | Status: DC
  Administered 2023-11-22 – 2023-11-27 (×6): 10 mg via ORAL
  Filled 2023-11-22 (×6): qty 1

## 2023-11-22 MED ORDER — ACETAMINOPHEN 650 MG RE SUPP: 650.0000 mg | Freq: Four times a day (QID) | RECTAL | Status: DC | PRN

## 2023-11-22 NOTE — Assessment & Plan Note (Signed)
 Echocardiogram from 2022 with reduced LV systolic function 30 to 35%, global hypokinesis, RV with mild reduction in systolic function, RVPS 35 mmHg, mild dilatated LA and RA, mild MR, mild TR and moderate aortic regurgitation.   Will continue blood pressure monitoring Hold on valsartan  and furosemide  to avoid hypotension Continue with mineralocorticoid receptor blocker.

## 2023-11-22 NOTE — ED Triage Notes (Signed)
 Pt with PNA, noted pt with cough in triage. Pt here today due to generalized weakness to point pt is not able to walk.  Family states she is not eating well and not enough fluids.

## 2023-11-22 NOTE — Assessment & Plan Note (Signed)
 Continue rhythm control with amiodarone  and anticoagulation with apixaban  Continue telemetry monitoring

## 2023-11-22 NOTE — Assessment & Plan Note (Signed)
 Continue blood pressure monitoring, hold on valsartan  for now.

## 2023-11-22 NOTE — Assessment & Plan Note (Signed)
 Hyponatremia.  Renal function with stable serum cr.  Plan to continue supportive medical therapy and follow up renal function and electrolytes in am.

## 2023-11-22 NOTE — Assessment & Plan Note (Signed)
 Patient with significant rhonchi and productive cough Possible tracheobronchitis.   Plan to continue antibiotic therapy with ceftriaxone IV and azithromycin  po Continue bronchodilator therapy and airway clearing techniques with flutter valve and incentive spirometer As needed antitussive therapy.  Inhaled corticosteroids and LABA.  Because severe pneumonia will start systemic corticosteroids with methylprednisolone .  Continue oxymetry monitoring

## 2023-11-22 NOTE — ED Notes (Signed)
 Attempted to walk to bathroom, was unsuccessful. Pt was very weak and unable to take more than 3 steps. Pt's O2 stayed 95-96% on RA

## 2023-11-22 NOTE — H&P (Signed)
 History and Physical    Patient: [name] FMW:[id number] DOB: 03-06-1929 DOA: 11/22/2023 DOS: the patient was seen and examined on 11/22/2023 PCP: Zollie Lowers, MD  Patient coming from: Home  Chief Complaint:  Chief Complaint  Patient presents with   Weakness   HPI: [name] is a 89 y.o. female with medical history significant of atrial fibrillation, hypertension, and history of CVA, who presented with dyspnea and cough.  She reported having flu like symptoms for the last 5 days, that progressed into dyspnea on exertion and productive cough, associated with generalized weakness. Two days after her symptoms started she suffered a mechanical fall while trying to get out of the bed, she hit her head with the nightstand, but fortunately had no loss of consciousness. The following day she was advised by her primary care provider to be checked at the ED.  09/12 ED visit was noted hypoxemic, chest radiograph was obtained and she was diagnosed with community acquired pneumonia. Decision was made to discharge patient home with oral antibiotic therapy, levofloxacin .   Unfortunately at home she continue to rapidly deteriorate with worsening dyspnea, productive cough and generalized weakness. Not able to ambulate due to weakness and having very poor oral intake. Her daughter brought her to the hospital for further evaluation.   Review of Systems: As mentioned in the history of present illness. All other systems reviewed and are negative. Past Medical History:  Diagnosis Date   Acute kidney injury superimposed on chronic kidney disease stage IIIa (HCC) 05/23/2022   Arthritis    Atrial fibrillation (HCC)    postoperative   COVID-19    CVA (cerebral vascular accident) (HCC)    HTN (hypertension)    MR (mitral regurgitation)    severe   NSTEMI (non-ST elevated myocardial infarction) (HCC)    Orthostatic hypotension 11/06/2011   Pericarditis    postoperative   PNA (pneumonia)    Past  Surgical History:  Procedure Laterality Date   ANTERIOR AND POSTERIOR VAGINAL REPAIR  10/15/04   Dr. Rosalynn    anterior repair with perigee graft  04/16/06   posterior repair with apogee graft; lynx mid urethral sling; sacrospinous ligamnet suspension and cystoscopy; Dr. Tonlbin   MITRAL VALVE REPLACEMENT     sacral spinous ligament suspension of vagina     suprapubic cystectomy     TOTAL ABDOMINAL HYSTERECTOMY W/ BILATERAL SALPINGOOPHORECTOMY     Social History:  reports that she has never smoked. She has never used smokeless tobacco. She reports that she does not drink alcohol and does not use drugs.  Allergies  Allergen Reactions   Donepezil  Other (See Comments)    bradycardia   Actonel  [Risedronate  Sodium] Nausea And Vomiting   Aspirin -Caffeine Other (See Comments)    Upset stomach    Codeine  Other (See Comments)    Upset stomach   Memantine  Other (See Comments)    Unknown    Penicillins Other (See Comments)    Unknown    Sulfonamide Derivatives Other (See Comments)    Swollen tongue    Family History  Problem Relation Age of Onset   Heart disease Father    Lupus Father    Heart attack Father    Heart disease Mother    Colon cancer Brother    Heart disease Brother     Prior to Admission medications   Medication Sig Start Date End Date Taking? Authorizing Provider  acetaminophen  (TYLENOL ) 500 MG tablet Take 2 tablets (1,000 mg total) by mouth 3 (  three) times daily. 04/01/23   Zollie Lowers, MD  amiodarone  (PACERONE ) 200 MG tablet Take 1 tablet by mouth once daily 10/19/23   Nishan, Peter C, MD  apixaban  (ELIQUIS ) 2.5 MG TABS tablet Take 1 tablet by mouth twice daily 07/20/23   Nishan, Peter C, MD  atorvastatin  (LIPITOR) 10 MG tablet Take 1 tablet by mouth once daily 10/19/23   Nishan, Peter C, MD  Biotin 1000 MCG tablet Take 1,000 mcg by mouth daily.    [provider]  cholecalciferol 25 MCG (1000 UT) tablet Take 1,000 Units by mouth daily.    [provider]  dextromethorphan -guaiFENesin  (MUCINEX  DM) 30-600 MG 12hr tablet Take 1 tablet by mouth 2 (two) times daily as needed for cough. 11/21/23   Horton, Roxie HERO, DO  eplerenone  (INSPRA ) 25 MG tablet Take 1 tablet by mouth once daily 05/22/23   Nishan, Peter C, MD  furosemide  (LASIX ) 20 MG tablet Take 1 tablet (20 mg total) by mouth daily. 06/30/23   Zollie Lowers, MD  gabapentin  (NEURONTIN ) 300 MG capsule Take 2 capsules (600 mg total) by mouth at bedtime. For hip and thigh pain. 06/30/23   Zollie Lowers, MD  HYDROcodone -acetaminophen  (NORCO/VICODIN) 5-325 MG tablet Take 0.5 tablets by mouth every 6 (six) hours. 08/28/23   [provider]  levofloxacin  (LEVAQUIN ) 250 MG tablet Take 3 tablets (750 mg total) by mouth daily for 7 days. 11/20/23 11/27/23  Yolande Lamar BROCKS, MD  lidocaine  (LIDODERM ) 5 % Place 1 patch onto the skin daily. Remove & Discard patch within 12 hours or as directed by MD 03/24/23   Deitra Morton Sebastian Nena, NP  valsartan  (DIOVAN ) 80 MG tablet Take 1 tablet (80 mg total) by mouth daily. 01/05/23   Zollie Lowers, MD    Physical Exam: Vitals:   11/22/23 1643 11/22/23 1644 11/22/23 2045  BP: (!) 113/56  (!) 146/59  Pulse: 72  66  Resp: 14  18  Temp: 99.1 F (37.3 C)    TempSrc: Oral    SpO2: 97%  94%  Weight:  45.4 kg   Height:  5' 3 (1.6 m)    BP (!) 147/57   Pulse 66   Temp 99.1 F (37.3 C) (Oral)   Resp 18   Ht 5' 3 (1.6 m)   Wt 45.4 kg   SpO2 94%   BMI 17.71 kg/m    Neurology awake and alert, deconditioned and ill looking appearing ENT with mild pallor with no icterus, dry mucous membranes Cardiovascular with S1 and S2 present, and regular with no gallops, rubs or murmurs Respiratory with poor inspiratory effort, bilateral rhonchi and scattered rales, more at bases with no wheezing Abdomen with no distention, soft and non tender No lower extremity edema   Data Reviewed:   Na 134, K 3.3 Cl 98 bicarbonate 23, glucose 108 bun 22 cr  1,27  Wbc 6,4 hgb 11.0 plt 170  09/12 influenza negative, COVID 19 negative, RSV negative   Chest radiograph with hyperinflation, left rotation, prominent pulmonary vasculature, small infiltrates right lower lobe, mediastinal calcified granuloma.   09/12 EKG 69 bpm, left axis deviation, right bundle branch block, qtc 458, sinus rhythm with no significant ST segment or T wave changes.   Assessment and Plan: * Right lower lobe pneumonia Patient with significant rhonchi and productive cough Possible tracheobronchitis.   Plan to continue antibiotic therapy with ceftriaxone IV and azithromycin  po Continue bronchodilator therapy and airway clearing techniques with flutter valve and incentive spirometer As needed  antitussive therapy.  Inhaled corticosteroids and LABA.  Because severe pneumonia will start systemic corticosteroids with methylprednisolone .  Continue oxymetry monitoring   Chronic HFrEF (heart failure with reduced ejection fraction) (HCC) Echocardiogram from 2022 with reduced LV systolic function 30 to 35%, global hypokinesis, RV with mild reduction in systolic function, RVPS 35 mmHg, mild dilatated LA and RA, mild MR, mild TR and moderate aortic regurgitation.   Will continue blood pressure monitoring Hold on valsartan  and furosemide  to avoid hypotension Continue with mineralocorticoid receptor blocker.   Paroxysmal atrial fibrillation (HCC) Continue rhythm control with amiodarone  and anticoagulation with apixaban  Continue telemetry monitoring   Essential hypertension Continue blood pressure monitoring, hold on valsartan  for now.   Chronic kidney disease, stage 3a (HCC) Hyponatremia.  Renal function with stable serum cr.  Plan to continue supportive medical therapy and follow up renal function and electrolytes in am.   Hyperlipidemia Continue statin therapy.       Advance Care Planning:   Code Status: Limited: Do not attempt resuscitation (DNR) -DNR-LIMITED -Do Not  Intubate/DNI    Consults: none   Family Communication: I spoke with patient's daughter at the bedside, we talked in detail about patient's condition, plan of care and prognosis and all questions were addressed.   Severity of Illness: The appropriate patient status for this patient is INPATIENT. Inpatient status is judged to be reasonable and necessary in order to provide the required intensity of service to ensure the patient's safety. The patient's presenting symptoms, physical exam findings, and initial radiographic and laboratory data in the context of their chronic comorbidities is felt to place them at high risk for further clinical deterioration. Furthermore, it is not anticipated that the patient will be medically stable for discharge from the hospital within 2 midnights of admission.   * I certify that at the point of admission it is my clinical judgment that the patient will require inpatient hospital care spanning beyond 2 midnights from the point of admission due to high intensity of service, high risk for further deterioration and high frequency of surveillance required.*  Author: Elidia Toribio Furnace, MD 11/22/2023 9:30 PM  For on call review [email or web address].

## 2023-11-22 NOTE — ED Provider Notes (Signed)
  EMERGENCY DEPARTMENT AT [facility] Provider Note   CSN: [id number] Arrival date & time: 11/22/23  1626     Patient presents with: Weakness   [name] is a 89 y.o. female with a history including CVA, atrial fibrillation on Eliquis  presenting for evaluation of generalized weakness and persistent and worsening cough over the past 2 days.  She was seen at [facility] 2 days ago where she was evaluated for a fall which occurred early 2 mornings ago.  During that ED evaluation chest x-ray confirmed right pneumonia.  Patient has had a somewhat productive cough which has worsened over the past 2 days.  She felt well enough to go home at her prior visit but has progressively become more weak to the point where she is having great difficulty ambulating.  She does endorse some shortness of breath but her oxygen levels have been okay here.  She had COVID and flu testing at her prior visit which were negative.  She was prescribed Levaquin , she has had 2 doses of this medication, her first dose resulted in emesis but was able to keep yesterday and today's doses down.  She has had poor p.o. intake since yesterday.  History is provided by patient and daughter and granddaughter at bedside.   The history is provided by the patient and a relative.       Prior to Admission medications   Medication Sig Start Date End Date Taking? Authorizing Provider  acetaminophen  (TYLENOL ) 500 MG tablet Take 2 tablets (1,000 mg total) by mouth 3 (three) times daily. 04/01/23   Zollie Lowers, MD  amiodarone  (PACERONE ) 200 MG tablet Take 1 tablet by mouth once daily 10/19/23   Nishan, Peter C, MD  apixaban  (ELIQUIS ) 2.5 MG TABS tablet Take 1 tablet by mouth twice daily 07/20/23   Nishan, Peter C, MD  atorvastatin  (LIPITOR) 10 MG tablet Take 1 tablet by mouth once daily 10/19/23   Nishan, Peter C, MD  Biotin 1000 MCG tablet Take 1,000 mcg by mouth daily.    [provider]  cholecalciferol 25 MCG  (1000 UT) tablet Take 1,000 Units by mouth daily.    [provider]  dextromethorphan -guaiFENesin  (MUCINEX  DM) 30-600 MG 12hr tablet Take 1 tablet by mouth 2 (two) times daily as needed for cough. 11/21/23   Horton, Kristie M, DO  eplerenone  (INSPRA ) 25 MG tablet Take 1 tablet by mouth once daily 05/22/23   Nishan, Peter C, MD  furosemide  (LASIX ) 20 MG tablet Take 1 tablet (20 mg total) by mouth daily. 06/30/23   Zollie Lowers, MD  gabapentin  (NEURONTIN ) 300 MG capsule Take 2 capsules (600 mg total) by mouth at bedtime. For hip and thigh pain. 06/30/23   Zollie Lowers, MD  HYDROcodone -acetaminophen  (NORCO/VICODIN) 5-325 MG tablet Take 0.5 tablets by mouth every 6 (six) hours. 08/28/23   [provider]  levofloxacin  (LEVAQUIN ) 250 MG tablet Take 3 tablets (750 mg total) by mouth daily for 7 days. 11/20/23 11/27/23  Yolande Lamar BROCKS, MD  lidocaine  (LIDODERM ) 5 % Place 1 patch onto the skin daily. Remove & Discard patch within 12 hours or as directed by MD 03/24/23   Deitra Morton Sebastian Nena, NP  valsartan  (DIOVAN ) 80 MG tablet Take 1 tablet (80 mg total) by mouth daily. 01/05/23   Zollie Lowers, MD    Allergies: Donepezil , Actonel  [risedronate  sodium], Aspirin -caffeine, Codeine , Memantine , Penicillins, and Sulfonamide derivatives    Review of Systems  Constitutional:  Positive for fatigue. Negative for fever.  HENT:  Negative for congestion and sore throat.   Eyes: Negative.   Respiratory:  Positive for cough and shortness of breath. Negative for chest tightness.   Cardiovascular:  Negative for chest pain.  Gastrointestinal:  Positive for vomiting. Negative for abdominal pain and nausea.  Genitourinary: Negative.   Musculoskeletal:  Negative for arthralgias, joint swelling and neck pain.  Skin: Negative.  Negative for rash and wound.  Neurological:  Positive for weakness. Negative for dizziness, light-headedness, numbness and headaches.  Psychiatric/Behavioral: Negative.       Updated Vital Signs BP (!) 146/59   Pulse 66   Temp 99.1 F (37.3 C) (Oral)   Resp 18   Ht 5' 3 (1.6 m)   Wt 45.4 kg   SpO2 94%   BMI 17.71 kg/m   Physical Exam Vitals and nursing note reviewed.  Constitutional:      Appearance: She is well-developed.  HENT:     Head: Normocephalic and atraumatic.  Eyes:     Conjunctiva/sclera: Conjunctivae normal.  Cardiovascular:     Rate and Rhythm: Normal rate and regular rhythm.     Heart sounds: Normal heart sounds.  Pulmonary:     Effort: Pulmonary effort is normal. No prolonged expiration.     Breath sounds: Rhonchi present. No wheezing or rales.     Comments: Rhonchorous breath sounds bilateral lung fields.  Frequent wet sounding cough.  No wheezing is present. Abdominal:     General: Bowel sounds are normal.     Palpations: Abdomen is soft.     Tenderness: There is no abdominal tenderness.  Musculoskeletal:        General: Normal range of motion.     Cervical back: Normal range of motion.  Skin:    General: Skin is warm and dry.  Neurological:     Mental Status: She is alert.     (all labs ordered are listed, but only abnormal results are displayed) Labs Reviewed  CBC WITH DIFFERENTIAL/PLATELET - Abnormal; Notable for the following components:      Result Value   RBC 3.55 (*)    Hemoglobin 11.0 (*)    HCT 33.3 (*)    Lymphs Abs 0.6 (*)    All other components within normal limits  BASIC METABOLIC PANEL WITH GFR - Abnormal; Notable for the following components:   Sodium 134 (*)    Potassium 3.3 (*)    Glucose, Bld 108 (*)    Creatinine, Ser 1.27 (*)    GFR, Estimated 39 (*)    All other components within normal limits  URINALYSIS, ROUTINE W REFLEX MICROSCOPIC    EKG: None  Radiology: DG Chest 2 View Result Date: 11/22/2023 CLINICAL DATA:  Pneumonia. EXAM: CHEST - 2 VIEW COMPARISON:  Chest CT dated 11/20/2023. FINDINGS: No focal consolidation, pleural effusion or pneumothorax. Stable cardiomegaly.  Atherosclerotic calcification of the aorta. Mediastinal calcified granuloma. No acute osseous pathology. IMPRESSION: 1. No active cardiopulmonary disease. 2. Cardiomegaly. Electronically Signed   By: Vanetta Chou M.D.   On: 11/22/2023 17:33     Procedures   Medications Ordered in the ED - No data to display                                  Medical Decision Making Patient was diagnosed with pneumonia and visit to another site 3 days ago, since then has had productive worsening cough and generalized fatigue.  Patient lives  at home with close assistance by family, she has not been able to ambulate today secondary to weakness more than 1 or 2 steps.  We attempted to ambulate her in the department, both to collect a urine sample but also to check oxygen levels.  She was able to to walk halfway across her exam room when she became very weak and had to return to the bed.  She did not become tachypneic or hypoxic however.  Amount and/or Complexity of Data Reviewed Labs: ordered.    Details: Labs reviewed including be met and CBC, she has a hemoglobin of 11.0, her sodium is 134, potassium is 3.3, creatinine 1.27 glucose 108 Radiology: ordered.    Details: Chest x-ray reviewed, no acute findings today.  Given patient's worsening symptoms and pneumonia seen on chest x-ray 2 days ago, suspect pneumonia is still present however. Discussion of management or test interpretation with external provider(s): Patient discussed with Dr. Arrien who accepts patient for admission.  Risk Decision regarding hospitalization.        Final diagnoses:  Community acquired pneumonia of right lung, unspecified part of lung  Weakness    ED Discharge Orders     None          Birdena Mliss RIGGERS 11/22/23 2148    Suzette Pac, MD 11/23/23 1241

## 2023-11-23 ENCOUNTER — Encounter (HOSPITAL_COMMUNITY): Payer: Self-pay | Admitting: Internal Medicine

## 2023-11-23 ENCOUNTER — Telehealth: Payer: Self-pay | Admitting: Family Medicine

## 2023-11-23 DIAGNOSIS — N1831 Chronic kidney disease, stage 3a: Secondary | ICD-10-CM | POA: Diagnosis not present

## 2023-11-23 DIAGNOSIS — I10 Essential (primary) hypertension: Secondary | ICD-10-CM | POA: Diagnosis not present

## 2023-11-23 DIAGNOSIS — E876 Hypokalemia: Secondary | ICD-10-CM

## 2023-11-23 DIAGNOSIS — J189 Pneumonia, unspecified organism: Secondary | ICD-10-CM | POA: Diagnosis not present

## 2023-11-23 DIAGNOSIS — I480 Paroxysmal atrial fibrillation: Secondary | ICD-10-CM | POA: Diagnosis not present

## 2023-11-23 LAB — CBC
HCT: 32.6 % — ABNORMAL LOW (ref 36.0–46.0)
Hemoglobin: 10.8 g/dL — ABNORMAL LOW (ref 12.0–15.0)
MCH: 31.6 pg (ref 26.0–34.0)
MCHC: 33.1 g/dL (ref 30.0–36.0)
MCV: 95.3 fL (ref 80.0–100.0)
Platelets: 167 K/uL (ref 150–400)
RBC: 3.42 MIL/uL — ABNORMAL LOW (ref 3.87–5.11)
RDW: 14.4 % (ref 11.5–15.5)
WBC: 4.5 K/uL (ref 4.0–10.5)
nRBC: 0 % (ref 0.0–0.2)

## 2023-11-23 LAB — BASIC METABOLIC PANEL WITH GFR
Anion gap: 14 (ref 5–15)
BUN: 20 mg/dL (ref 8–23)
CO2: 22 mmol/L (ref 22–32)
Calcium: 8.8 mg/dL — ABNORMAL LOW (ref 8.9–10.3)
Chloride: 101 mmol/L (ref 98–111)
Creatinine, Ser: 1.32 mg/dL — ABNORMAL HIGH (ref 0.44–1.00)
GFR, Estimated: 38 mL/min — ABNORMAL LOW (ref >60)
Glucose, Bld: 117 mg/dL — ABNORMAL HIGH (ref 70–99)
Potassium: 3 mmol/L — ABNORMAL LOW (ref 3.5–5.1)
Sodium: 137 mmol/L (ref 135–145)

## 2023-11-23 MED ORDER — ALBUTEROL SULFATE (2.5 MG/3ML) 0.083% IN NEBU
2.5000 mg | INHALATION_SOLUTION | RESPIRATORY_TRACT | Status: DC | PRN
  Administered 2023-11-23: 2.5 mg via RESPIRATORY_TRACT
  Filled 2023-11-23: qty 3

## 2023-11-23 MED ORDER — SODIUM CHLORIDE 0.9 % IV SOLN
1.0000 g | INTRAVENOUS | Status: DC
  Administered 2023-11-23 – 2023-11-27 (×5): 1 g via INTRAVENOUS
  Filled 2023-11-23 (×5): qty 10

## 2023-11-23 MED ORDER — POTASSIUM CHLORIDE CRYS ER 20 MEQ PO TBCR
40.0000 meq | EXTENDED_RELEASE_TABLET | ORAL | Status: AC
  Administered 2023-11-23 (×2): 40 meq via ORAL
  Filled 2023-11-23 (×2): qty 2

## 2023-11-23 MED ORDER — FLUTICASONE FUROATE-VILANTEROL 100-25 MCG/ACT IN AEPB
1.0000 | INHALATION_SPRAY | Freq: Every day | RESPIRATORY_TRACT | Status: DC
Start: 2023-11-23 — End: 2023-11-27
  Administered 2023-11-24 – 2023-11-27 (×4): 1 via RESPIRATORY_TRACT
  Filled 2023-11-23: qty 28

## 2023-11-23 MED ORDER — GUAIFENESIN 100 MG/5ML PO LIQD
5.0000 mL | ORAL | Status: DC | PRN
  Administered 2023-11-23 – 2023-11-25 (×3): 5 mL via ORAL
  Filled 2023-11-23 (×3): qty 5

## 2023-11-23 MED ORDER — METHYLPREDNISOLONE SODIUM SUCC 40 MG IJ SOLR
40.0000 mg | INTRAMUSCULAR | Status: DC
  Administered 2023-11-23 – 2023-11-24 (×2): 40 mg via INTRAVENOUS
  Filled 2023-11-23 (×2): qty 1

## 2023-11-23 MED ORDER — SODIUM CHLORIDE 0.9 % IV BOLUS
250.0000 mL | Freq: Once | INTRAVENOUS | Status: AC
  Administered 2023-11-23: 250 mL via INTRAVENOUS

## 2023-11-23 MED ORDER — IPRATROPIUM-ALBUTEROL 0.5-2.5 (3) MG/3ML IN SOLN
3.0000 mL | Freq: Four times a day (QID) | RESPIRATORY_TRACT | Status: DC
  Administered 2023-11-23 (×4): 3 mL via RESPIRATORY_TRACT
  Filled 2023-11-23 (×4): qty 3

## 2023-11-23 MED ORDER — IPRATROPIUM-ALBUTEROL 0.5-2.5 (3) MG/3ML IN SOLN
3.0000 mL | Freq: Two times a day (BID) | RESPIRATORY_TRACT | Status: DC
  Administered 2023-11-24 – 2023-11-26 (×6): 3 mL via RESPIRATORY_TRACT
  Filled 2023-11-23 (×5): qty 3

## 2023-11-23 MED ORDER — AZITHROMYCIN 250 MG PO TABS
500.0000 mg | ORAL_TABLET | Freq: Every day | ORAL | Status: DC
  Administered 2023-11-23 – 2023-11-27 (×5): 500 mg via ORAL
  Filled 2023-11-23 (×5): qty 2

## 2023-11-23 NOTE — Progress Notes (Signed)
 Progress Note   Patient: [name] FMW:[id number] DOB: 01-Sep-1928 DOA: 11/22/2023     1 DOS: the patient was seen and examined on 11/23/2023   Brief hospital admission narrative: As per H&P written by Dr. Noralee on 11/22/2023 [name] is a 89 y.o. female with medical history significant of atrial fibrillation, hypertension, and history of CVA, who presented with dyspnea and cough.  She reported having flu like symptoms for the last 5 days, that progressed into dyspnea on exertion and productive cough, associated with generalized weakness. Two days after her symptoms started she suffered a mechanical fall while trying to get out of the bed, she hit her head with the nightstand, but fortunately had no loss of consciousness. The following day she was advised by her primary care provider to be checked at the ED.  09/12 ED visit was noted hypoxemic, chest radiograph was obtained and she was diagnosed with community acquired pneumonia. Decision was made to discharge patient home with oral antibiotic therapy, levofloxacin .    Unfortunately at home she continue to rapidly deteriorate with worsening dyspnea, productive cough and generalized weakness. Not able to ambulate due to weakness and having very poor oral intake. Her daughter brought her to the hospital for further evaluation.   Assessment and plan  Right lower lobe pneumonia - Expressed feeling better - Continue low-dose steroids, IV antibiotics, mucolytic's, bronchodilator management and supportive care - Good saturation on room air appreciated.  Patient reports no nausea, no vomiting and no chest pain currently.  Chronic HFrEF (heart failure with reduced ejection fraction) (HCC) -Echocardiogram from 2022 with reduced LV systolic function 30 to 35%, global hypokinesis, RV with mild reduction in systolic function, RVPS 35 mmHg, mild dilatated LA and RA, mild MR, mild TR and moderate aortic regurgitation.  -Continue to be judicious  regarding fluid resuscitation. - Continue to follow daily weights, strict intake and output and low-sodium diet - Continue home medication and outpatient follow-up with cardiology service - Condition is currently compensated.  Paroxysmal atrial fibrillation (HCC) -Continue treatment with amiodarone  and continue the use of Eliquis  for secondary prevention.  Essential hypertension -Continue to hold ARB medications at the moment - Follow vital signs - Heart healthy/low-sodium diet discussed with patient.  Chronic kidney disease, stage 3a (HCC) -Stable - Continue to follow renal function trend and electrolytes - Maintain adequate hydration.  Hyperlipidemia -Continue statin.  Hypokalemia - Continue electrolyte repletion and follow trend - Will check magnesium level in AM.  Subjective:  Afebrile; expressed feeling better.  Reported intermittent mildly productive coughing spells.  No nausea, no vomiting, good saturation on room air.  Physical Exam: Vitals:   11/23/23 0936 11/23/23 1027 11/23/23 1035 11/23/23 1130  BP:   (!) 128/49 (!) 113/47  Pulse: 69  68   Resp:   16   Temp:  98.4 F (36.9 C)    TempSrc:  Oral    SpO2: 94%  94%   Weight:      Height:       General exam: Alert, awake, oriented x 3; in no major distress.  Reporting feeling weak. Respiratory system: Positive rhonchi bilaterally (right more than left); mild expiratory wheezing appreciated on exam. Cardiovascular system: Rate controlled, no rubs, no gallops, no JVD. Gastrointestinal system: Abdomen is nondistended, soft and nontender. No organomegaly or masses felt. Normal bowel sounds heard. Central nervous system: Moving 4 limbs spontaneously.  No focal neurological deficits. Extremities: No cyanosis or clubbing. Skin: No petechiae. Psychiatry: Judgement and insight appear  normal. Mood & affect appropriate.    Data Reviewed: Basic metabolic panel: Sodium 137, potassium 3.0, chloride 101, bicarb 22, BUN 20,  creatinine 1.32 and GFR 38 CBC: WBCs 4.5, hemoglobin 10.8 and platelet count 167K  Family Communication: Daughter at bedside.  Disposition: Status is: Inpatient Remains inpatient appropriate because: Continue IV antibiotics.  Anticipating discharge back home with home health services.  Time spent: 50 minutes  Author: Eric Nunnery, MD 11/23/2023 1:18 PM  For on call review [email or web address].

## 2023-11-23 NOTE — TOC Initial Note (Signed)
 Transition of Care [facility]) - Initial/Assessment Note    Patient Details  Name: [name] MRN: [id number] Date of Birth: 1928-03-29  [facility]) CM/SW Contact:    Lucie Lunger, LCSWA Phone Number: 11/23/2023, 11:30 AM  Clinical Narrative:                 CSW updated by PT that they are recommending SNF for pt at D/C. CSW spoke with pt and daughter at bedside to complete assessment. Pt is from home and her son lives with her. Pt was previously independent with ADLs. Family provides transportation to appointments when needed. Pt has a cane, walker and bedside commode in the home. CSW spoke with pt and her daughter about SNF placement, pt states she prefers to return home. Pts daughter states they as a family feel they can assist pt as needed and are interested in [facility] services being arranged. CSW reached out to [facility] with [facility] to see if they can accept pt for [facility] PT/OT/RN/aide services, they are able to accept. CSW reached out to MD to place [facility] orders when able. TOC to follow.   Expected Discharge Plan: Home w Home Health Services Barriers to Discharge: Continued Medical Work up   Patient Goals and CMS Choice Patient states their goals for this hospitalization and ongoing recovery are:: return home CMS Medicare.gov Compare Post Acute Care list provided to:: Patient Choice offered to / list presented to : Patient, Adult Children [place] ownership interest in [facility].provided to:: Patient    Expected Discharge Plan and Services In-house Referral: Clinical Social Work Discharge Planning Services: CM Consult Post Acute Care Choice: Home Health Living arrangements for the past 2 months: Single Family Home                           HH Arranged: RN, PT, OT, Nurse's Aide          Prior Living Arrangements/Services Living arrangements for the past 2 months: Single Family Home Lives with:: Adult Children Patient language and need for interpreter  reviewed:: Yes Do you feel safe going back to the place where you live?: Yes      Need for Family Participation in Patient Care: Yes (Comment) Care giver support system in place?: Yes (comment) Current home services: DME (cane, walker, bedside commode) Criminal Activity/Legal Involvement Pertinent to Current Situation/Hospitalization: No - Comment as needed  Activities of Daily Living   ADL Screening (condition at time of admission) Independently performs ADLs?: No Does the patient have a NEW difficulty with bathing/dressing/toileting/self-feeding that is expected to last >3 days?: Yes (Initiates electronic notice to provider for possible OT consult) Does the patient have a NEW difficulty with getting in/out of bed, walking, or climbing stairs that is expected to last >3 days?: Yes (Initiates electronic notice to provider for possible PT consult) Does the patient have a NEW difficulty with communication that is expected to last >3 days?: No Is the patient deaf or have difficulty hearing?: No Does the patient have difficulty seeing, even when wearing glasses/contacts?: No Does the patient have difficulty concentrating, remembering, or making decisions?: No  Permission Sought/Granted                  Emotional Assessment Appearance:: Appears stated age Attitude/Demeanor/Rapport: Engaged Affect (typically observed): Accepting Orientation: : Oriented to Self, Oriented to Place, Oriented to  Time, Oriented to Situation Alcohol / Substance Use: Not Applicable Psych Involvement:  No (comment)  Admission diagnosis:  Pneumonia [J18.9] Patient Active Problem List   Diagnosis Date Noted   Right lower lobe pneumonia 11/22/2023   Bilateral hip joint arthritis 04/20/2023   Left hip pain 03/24/2023   Hyperlipidemia 05/23/2022   Chronic kidney disease, stage 3a (HCC) 05/19/2022   Chronic HFrEF (heart failure with reduced ejection fraction) (HCC) 05/17/2022   Ascending aortic aneurysm (HCC)  05/15/2021   Mild cognitive impairment 02/04/2021   Fever    Senile dementia without behavioral disturbance (HCC) 07/09/2020   Paroxysmal atrial fibrillation (HCC) 04/09/2020   Cardiomyopathy (HCC) 04/09/2020   Iron deficiency anemia due to chronic blood loss 01/12/2017   Hypothyroidism 01/06/2017   RBBB 10/12/2013   Mitral valve failure 10/09/2010   PULMONARY HYPERTENSION, MILD 09/26/2009   DIVERTICULOSIS, COLON 09/26/2009   TRANSIENT ISCHEMIC ATTACKS, HX OF 09/26/2009   History of colonic polyps 09/26/2009   ISCHEMIC COLITIS, HX OF 09/26/2009   Essential hypertension 08/26/2008   PCP:  Zollie Lowers, MD Pharmacy:   [facility] [address] [place] - [address] [place] [address] Phone: [phone] Fax: [phone]     Social Drivers of Health (SDOH) Social History: SDOH Screenings   Food Insecurity: No Food Insecurity (11/23/2023)  Housing: Low Risk  (11/23/2023)  Transportation Needs: No Transportation Needs (11/23/2023)  Utilities: Not At Risk (11/23/2023)  Alcohol Screen: Low Risk  (05/12/2022)  Depression (PHQ2-9): Low Risk  (04/20/2023)  Financial Resource Strain: Low Risk  (05/26/2022)  Physical Activity: Insufficiently Active (05/12/2022)  Social Connections: Moderately Integrated (11/23/2023)  Stress: No Stress Concern Present (05/12/2022)  Tobacco Use: Low Risk  (11/20/2023)   SDOH Interventions:     Readmission Risk Interventions    11/23/2023   11:28 AM  Readmission Risk Prevention Plan  Transportation Screening Complete  Home Care Screening Complete  Medication Review (RN CM) Complete

## 2023-11-23 NOTE — Plan of Care (Signed)
  Problem: Acute Rehab PT Goals(only PT should resolve) Goal: Pt Will Go Supine/Side To Sit Outcome: Progressing Flowsheets (Taken 11/23/2023 1450) Pt will go Supine/Side to Sit:  with contact guard assist  with minimal assist Goal: Patient Will Transfer Sit To/From Stand Outcome: Progressing Flowsheets (Taken 11/23/2023 1450) Patient will transfer sit to/from stand:  with contact guard assist  with minimal assist Goal: Pt Will Transfer Bed To Chair/Chair To Bed Outcome: Progressing Flowsheets (Taken 11/23/2023 1450) Pt will Transfer Bed to Chair/Chair to Bed:  with contact guard assist  with min assist Goal: Pt Will Ambulate Outcome: Progressing Flowsheets (Taken 11/23/2023 1450) Pt will Ambulate:  25 feet  with contact guard assist  with minimal assist  with rolling walker   2:50 PM, 11/23/23 Lynwood Music, MPT Physical Therapist with [facility] 336 [phone] office [phone] mobile phone

## 2023-11-23 NOTE — Evaluation (Signed)
 Physical Therapy Evaluation Patient Details Name: [name] MRN: [id number] DOB: 11-29-1928 Today's Date: 11/23/2023  History of Present Illness  [name] is a 89 y.o. female with medical history significant of atrial fibrillation, hypertension, and history of CVA, who presented with dyspnea and cough.   She reported having flu like symptoms for the last 5 days, that progressed into dyspnea on exertion and productive cough, associated with generalized weakness. Two days after her symptoms started she suffered a mechanical fall while trying to get out of the bed, she hit her head with the nightstand, but fortunately had no loss of consciousness. The following day she was advised by her primary care provider to be checked at the ED.   09/12 ED visit was noted hypoxemic, chest radiograph was obtained and she was diagnosed with community acquired pneumonia. Decision was made to discharge patient home with oral antibiotic therapy, levofloxacin .      Unfortunately at home she continue to rapidly deteriorate with worsening dyspnea, productive cough and generalized weakness. Not able to ambulate due to weakness and having very poor oral intake.  Her daughter brought her to the hospital for further evaluation.   Clinical Impression  Patient demonstrates slow labored movement for sitting up at bedside, once seated required assistance for maintaining balance due to posterior leaning, limited to a few steps forward/backwards at bedside before having to sit due to weakness and fatigue. Patient tolerated sitting up in chair after therapy with her daughter present. Patient will benefit from continued skilled physical therapy in hospital and recommended venue below to increase strength, balance, endurance for safe ADLs and gait.           If plan is discharge home, recommend the following: A lot of help with bathing/dressing/bathroom;A lot of help with walking and/or transfers;Help with stairs or ramp for  entrance;Assist for transportation   Can travel by private vehicle   Yes    Equipment Recommendations None recommended by PT  Recommendations for Other Services       Functional Status Assessment Patient has had a recent decline in their functional status and demonstrates the ability to make significant improvements in function in a reasonable and predictable amount of time.     Precautions / Restrictions Precautions Precautions: Fall Restrictions Weight Bearing Restrictions Per Provider Order: No      Mobility  Bed Mobility Overal bed mobility: Needs Assistance Bed Mobility: Supine to Sit     Supine to sit: Mod assist     General bed mobility comments: increased time, labored movement    Transfers Overall transfer level: Needs assistance Equipment used: None, Rolling walker (2 wheels) Transfers: Sit to/from Stand, Bed to chair/wheelchair/BSC Sit to Stand: Mod assist   Step pivot transfers: Mod assist       General transfer comment: unable to maintain standing balance without AD due to weakness, required use of RW for safety    Ambulation/Gait Ambulation/Gait assistance: Mod assist Gait Distance (Feet): 10 Feet Assistive device: Rolling walker (2 wheels) Gait Pattern/deviations: Decreased step length - right, Decreased step length - left, Decreased stride length Gait velocity: slow     General Gait Details: limited to a few steps forward/backwards at bedside before having to sit due to fatigue and weakness  Stairs            Wheelchair Mobility     Tilt Bed    Modified Rankin (Stroke Patients Only)       Balance Overall balance assessment: Needs  assistance Sitting-balance support: Feet supported, No upper extremity supported Sitting balance-Leahy Scale: Poor Sitting balance - Comments: fair/poor seated at EOB due to posterior leaning   Standing balance support: Reliant on assistive device for balance, During functional activity, Bilateral  upper extremity supported Standing balance-Leahy Scale: Poor Standing balance comment: using RW                             Pertinent Vitals/Pain Pain Assessment Pain Assessment: No/denies pain    Home Living Family/patient expects to be discharged to:: Private residence Living Arrangements: Children Available Help at Discharge: Family;Available 24 hours/day Type of Home: House Home Access: Stairs to enter Entrance Stairs-Rails: Right;Left Entrance Stairs-Number of Steps: 2 in front 4 in the back   Home Layout: One level Home Equipment: Agricultural consultant (2 wheels);Cane - single point;BSC/3in1;Grab bars - toilet;Cane - quad      Prior Function Prior Level of Function : Needs assist       Physical Assist : Mobility (physical);ADLs (physical) Mobility (physical): Bed mobility;Transfers;Gait;Stairs   Mobility Comments: household ambulation using SPC and leaning on nearby objects for support, uses RW for longer distances ADLs Comments: Assisted by family     Extremity/Trunk Assessment   Upper Extremity Assessment Upper Extremity Assessment: Defer to OT evaluation    Lower Extremity Assessment Lower Extremity Assessment: Generalized weakness    Cervical / Trunk Assessment Cervical / Trunk Assessment: Kyphotic  Communication   Communication Communication: No apparent difficulties    Cognition Arousal: Alert Behavior During Therapy: WFL for tasks assessed/performed   PT - Cognitive impairments: No apparent impairments                         Following commands: Intact       Cueing Cueing Techniques: Verbal cues, Tactile cues     General Comments      Exercises     Assessment/Plan    PT Assessment Patient needs continued PT services  PT Problem List Decreased strength;Decreased activity tolerance;Decreased balance;Decreased mobility       PT Treatment Interventions DME instruction;Gait training;Stair training;Functional mobility  training;Therapeutic activities;Therapeutic exercise;Balance training;Patient/family education    PT Goals (Current goals can be found in the Care Plan section)  Acute Rehab PT Goals Patient Stated Goal: return home with family to assist PT Goal Formulation: With patient/family Time For Goal Achievement: 12/07/23 Potential to Achieve Goals: Good    Frequency Min 3X/week     Co-evaluation               AM-PAC PT 6 Clicks Mobility  Outcome Measure Help needed turning from your back to your side while in a flat bed without using bedrails?: A Lot Help needed moving from lying on your back to sitting on the side of a flat bed without using bedrails?: A Lot Help needed moving to and from a bed to a chair (including a wheelchair)?: A Lot Help needed standing up from a chair using your arms (e.g., wheelchair or bedside chair)?: A Lot Help needed to walk in hospital room?: A Lot Help needed climbing 3-5 steps with a railing? : A Lot 6 Click Score: 12    End of Session   Activity Tolerance: Patient tolerated treatment well;Patient limited by fatigue Patient left: in chair;with call bell/phone within reach;with family/visitor present Nurse Communication: Mobility status PT Visit Diagnosis: Unsteadiness on feet (R26.81);Other abnormalities of gait and mobility (R26.89);Muscle weakness (generalized) (  M62.81)    Time: 0937-1000 PT Time Calculation (min) (ACUTE ONLY): 23 min   Charges:   PT Evaluation $PT Eval Moderate Complexity: 1 Mod PT Treatments $Therapeutic Activity: 23-37 mins PT General Charges $$ ACUTE PT VISIT: 1 Visit         2:48 PM, 11/23/23 Lynwood Music, MPT Physical Therapist with [facility] 336 [phone] office [phone] mobile phone

## 2023-11-23 NOTE — Telephone Encounter (Signed)
 fyi

## 2023-11-23 NOTE — Assessment & Plan Note (Signed)
 Continue statin therapy

## 2023-11-23 NOTE — Telephone Encounter (Signed)
 Daughter came in today tbs & told me that her mom is in [address] for a fall.

## 2023-11-23 NOTE — ED Notes (Signed)
Respiratory contacted for duo-neb.

## 2023-11-24 DIAGNOSIS — N1831 Chronic kidney disease, stage 3a: Secondary | ICD-10-CM | POA: Diagnosis not present

## 2023-11-24 DIAGNOSIS — J189 Pneumonia, unspecified organism: Secondary | ICD-10-CM | POA: Diagnosis not present

## 2023-11-24 DIAGNOSIS — I10 Essential (primary) hypertension: Secondary | ICD-10-CM | POA: Diagnosis not present

## 2023-11-24 DIAGNOSIS — I480 Paroxysmal atrial fibrillation: Secondary | ICD-10-CM | POA: Diagnosis not present

## 2023-11-24 LAB — BASIC METABOLIC PANEL WITH GFR
Anion gap: 7 (ref 5–15)
BUN: 31 mg/dL — ABNORMAL HIGH (ref 8–23)
CO2: 22 mmol/L (ref 22–32)
Calcium: 8.5 mg/dL — ABNORMAL LOW (ref 8.9–10.3)
Chloride: 106 mmol/L (ref 98–111)
Creatinine, Ser: 1.61 mg/dL — ABNORMAL HIGH (ref 0.44–1.00)
GFR, Estimated: 30 mL/min — ABNORMAL LOW (ref >60)
Glucose, Bld: 156 mg/dL — ABNORMAL HIGH (ref 70–99)
Potassium: 5 mmol/L (ref 3.5–5.1)
Sodium: 135 mmol/L (ref 135–145)

## 2023-11-24 LAB — MAGNESIUM: Magnesium: 2.2 mg/dL (ref 1.7–2.4)

## 2023-11-24 MED ORDER — EPLERENONE 25 MG PO TABS
12.5000 mg | ORAL_TABLET | Freq: Every day | ORAL | Status: DC
Start: 2023-11-24 — End: 2023-11-25
  Administered 2023-11-24: 12.5 mg via ORAL
  Filled 2023-11-24 (×3): qty 1

## 2023-11-24 MED ORDER — SODIUM CHLORIDE 0.9 % IV BOLUS
250.0000 mL | Freq: Once | INTRAVENOUS | Status: AC
Start: 2023-11-24 — End: 2023-11-24
  Administered 2023-11-24: 250 mL via INTRAVENOUS

## 2023-11-24 NOTE — Plan of Care (Signed)
  Problem: Acute Rehab OT Goals (only OT should resolve) Goal: Pt. Will Perform Grooming Flowsheets (Taken 11/24/2023 1319) Pt Will Perform Grooming:  with modified independence  standing Goal: Pt. Will Perform Upper Body Dressing Flowsheets (Taken 11/24/2023 1319) Pt Will Perform Upper Body Dressing: with modified independence Goal: Pt. Will Perform Lower Body Dressing Flowsheets (Taken 11/24/2023 1319) Pt Will Perform Lower Body Dressing:  with modified independence  sitting/lateral leans Goal: Pt. Will Transfer To Toilet Flowsheets (Taken 11/24/2023 1319) Pt Will Transfer to Toilet:  with modified independence  stand pivot transfer  ambulating Goal: Pt. Will Perform Toileting-Clothing Manipulation Flowsheets (Taken 11/24/2023 1319) Pt Will Perform Toileting - Clothing Manipulation and hygiene: with modified independence Goal: Pt/Caregiver Will Perform Home Exercise Program Flowsheets (Taken 11/24/2023 1319) Pt/caregiver will Perform Home Exercise Program:  Increased strength  Both right and left upper extremity  Independently  Admir Candelas OT, MOT

## 2023-11-24 NOTE — Progress Notes (Signed)
 Progress Note   Patient: [name] FMW:[id number] DOB: June 11, 1928 DOA: 11/22/2023     2 DOS: the patient was seen and examined on 11/24/2023   Brief hospital admission narrative: As per H&P written by Dr. Noralee on 11/22/2023 [name] is a 89 y.o. female with medical history significant of atrial fibrillation, hypertension, and history of CVA, who presented with dyspnea and cough.  She reported having flu like symptoms for the last 5 days, that progressed into dyspnea on exertion and productive cough, associated with generalized weakness. Two days after her symptoms started she suffered a mechanical fall while trying to get out of the bed, she hit her head with the nightstand, but fortunately had no loss of consciousness. The following day she was advised by her primary care provider to be checked at the ED.  09/12 ED visit was noted hypoxemic, chest radiograph was obtained and she was diagnosed with community acquired pneumonia. Decision was made to discharge patient home with oral antibiotic therapy, levofloxacin .    Unfortunately at home she continue to rapidly deteriorate with worsening dyspnea, productive cough and generalized weakness. Not able to ambulate due to weakness and having very poor oral intake. Her daughter brought her to the hospital for further evaluation.   Assessment and plan  Right lower lobe pneumonia - Expressed feeling better - Continue IV antibiotics, mucolytic's, bronchodilator management and supportive care - Good saturation on room air appreciated.  Patient reports no nausea, no vomiting and no chest pain currently. - Hopefully ready for discharge on 11/25/2023  Chronic HFrEF (heart failure with reduced ejection fraction) (HCC) -Echocardiogram from 2022 with reduced LV systolic function 30 to 35%, global hypokinesis, RV with mild reduction in systolic function, RVPS 35 mmHg, mild dilatated LA and RA, mild MR, mild TR and moderate aortic regurgitation.   -Continue to be judicious regarding fluid resuscitation. - Continue to follow daily weights, strict intake and output and low-sodium diet - Continue home medication and outpatient follow-up with cardiology service - Condition is currently compensated and stable.  Paroxysmal atrial fibrillation (HCC) -Continue treatment with amiodarone  and continue the use of Eliquis  for secondary prevention.  Essential hypertension -Continue to hold ARB medications at the moment - Follow vital signs - Heart healthy/low-sodium diet discussed with patient.  Chronic kidney disease, stage 3a (HCC) -Stable - Continue to follow renal function trend and electrolytes - Maintain adequate hydration.  Hyperlipidemia -Continue statin.  Hypokalemia - Continue electrolyte repletion and follow trend - Will check magnesium level in AM.  Jitteriness/tremors - Most likely associated with the steroids - Will wean off the use of Solu-Medrol  - Continue supportive care and follow response.  Subjective:  No fever, no chest pain, no nausea vomiting.  Family reporting some intermittent confusion and patient expressed having some NG tube in and tremors at times preventing her to complete simple tasks (not properly using her fork while eating).  Physical Exam: Vitals:   11/23/23 2344 11/24/23 0309 11/24/23 0903 11/24/23 1417  BP: (!) 105/48 114/67  (!) 82/49  Pulse: 60 61  (!) 55  Resp:  16    Temp:  98.9 F (37.2 C)  98.2 F (36.8 C)  TempSrc:  Oral  Oral  SpO2:  98% 98% 97%  Weight:      Height:       General exam: Alert, awake, oriented x 2; able to follow commands appropriately.  Reporting no fever, still intermittent mildly productive coughing spells and some jittering. Respiratory system: No wheezing,  no using accessory muscles; positive rhonchi bilaterally.  (Good saturation on room air). Cardiovascular system: Rate controlled, no rubs, no gallops, no JVD. Gastrointestinal system: Abdomen is  nondistended, soft and nontender.  Bowel sounds. Central nervous system: Generally weak; moving 4 limbs spontaneously.. No focal neurological deficits. Extremities: No cyanosis or clubbing. Skin: No petechiae. Psychiatry: Judgement and insight appear normal. Mood & affect appropriate.   Latest data Reviewed: Basic metabolic panel: Sodium 135, potassium 5.0, chloride 106, bicarb 22 BUN 31, creatinine 1.6 and GFR 30 Magnesium: 2.2 CBC: WBCs 4.5, hemoglobin 10.8 and platelet count 167K  Family Communication: Daughter at bedside.  Disposition: Status is: Inpatient Remains inpatient appropriate because: Continue IV antibiotics.  Anticipating discharge back home with home health services.  Time spent: 50 minutes  Author: Eric Nunnery, MD 11/24/2023 5:43 PM  For on call review [email or web address].

## 2023-11-24 NOTE — Evaluation (Signed)
 Occupational Therapy Evaluation Patient Details Name: [name] MRN: [id number] DOB: 1928-05-18 Today's Date: 11/24/2023   History of Present Illness   [name] is a 89 y.o. female with medical history significant of atrial fibrillation, hypertension, and history of CVA, who presented with dyspnea and cough.   She reported having flu like symptoms for the last 5 days, that progressed into dyspnea on exertion and productive cough, associated with generalized weakness. Two days after her symptoms started she suffered a mechanical fall while trying to get out of the bed, she hit her head with the nightstand, but fortunately had no loss of consciousness. The following day she was advised by her primary care provider to be checked at the ED.   09/12 ED visit was noted hypoxemic, chest radiograph was obtained and she was diagnosed with community acquired pneumonia. Decision was made to discharge patient home with oral antibiotic therapy, levofloxacin . (per MD)     Clinical Impressions Pt agreeable to OT evaluation. Pt oriented by family reports cognition is not at baseline levels. Extended time and much cuing to follow commands. Pt noted to have  B UE tremors during intentional movement to try and doff socks. Pt unable to doff socks today. B Ue generally weak with deficits in seated and standing balance. Mod A for bed mobility and EOB to chair transfer today. Pt reports sponge bathing at baseline due to difficulty with tub/shower transfer. Educated on use of tub bench. Pt left in the chair with call bell within reach and family present. Pt will benefit from continued OT in the hospital to increase strength, balance, and endurance for safe ADL's.         If plan is discharge home, recommend the following:   A lot of help with walking and/or transfers;A lot of help with bathing/dressing/bathroom;Assistance with cooking/housework;Direct supervision/assist for medications management;Assist for  transportation;Help with stairs or ramp for entrance     Functional Status Assessment   Patient has had a recent decline in their functional status and demonstrates the ability to make significant improvements in function in a reasonable and predictable amount of time.     Equipment Recommendations   Tub/shower bench (Given education on tub bench since pt reported difficulty with tub/shower transfer at home.)             Precautions/Restrictions   Precautions Precautions: Fall Recall of Precautions/Restrictions: Impaired Restrictions Weight Bearing Restrictions Per Provider Order: No     Mobility Bed Mobility Overal bed mobility: Needs Assistance Bed Mobility: Supine to Sit     Supine to sit: Mod assist     General bed mobility comments: increased time, labored movement    Transfers Overall transfer level: Needs assistance Equipment used: Rolling walker (2 wheels) Transfers: Sit to/from Stand, Bed to chair/wheelchair/BSC Sit to Stand: Mod assist     Step pivot transfers: Mod assist     General transfer comment: EOB to chair; mild posterior lean; much time and cuing for steps to chair with assist to manage RW.      Balance Overall balance assessment: Needs assistance Sitting-balance support: Feet supported, No upper extremity supported Sitting balance-Leahy Scale: Poor Sitting balance - Comments: poor to fair seated at EOB   Standing balance support: Reliant on assistive device for balance, During functional activity, Bilateral upper extremity supported Standing balance-Leahy Scale: Poor Standing balance comment: using RW  ADL either performed or assessed with clinical judgement   ADL Overall ADL's : Needs assistance/impaired     Grooming: Minimal assistance;Contact guard assist;Sitting   Upper Body Bathing: Contact guard assist;Minimal assistance;Sitting   Lower Body Bathing: Maximal  assistance;Sitting/lateral leans   Upper Body Dressing : Contact guard assist;Minimal assistance;Sitting   Lower Body Dressing: Maximal assistance;Sitting/lateral leans Lower Body Dressing Details (indicate cue type and reason): Pt unable to doff and don sock seated in chair today. Toilet Transfer: Moderate assistance;Stand-pivot;Rolling walker (2 wheels) Toilet Transfer Details (indicate cue type and reason): EOB to chair with RW Toileting- Clothing Manipulation and Hygiene: Maximal assistance;Moderate assistance;Sitting/lateral lean       Functional mobility during ADLs: Moderate assistance;Rolling walker (2 wheels) General ADL Comments: Able to take 2 to 3 steps forward and backwards from chair with RW.     Vision Baseline Vision/History: 1 Wears glasses Ability to See in Adequate Light: 0 Adequate Patient Visual Report: No change from baseline Vision Assessment?: No apparent visual deficits     Perception Perception: Not tested       Praxis Praxis: Not tested       Pertinent Vitals/Pain Pain Assessment Pain Assessment: No/denies pain     Extremity/Trunk Assessment Upper Extremity Assessment Upper Extremity Assessment: Generalized weakness (B UE tremors.)   Lower Extremity Assessment Lower Extremity Assessment: Defer to PT evaluation   Cervical / Trunk Assessment Cervical / Trunk Assessment: Kyphotic   Communication Communication Communication: No apparent difficulties   Cognition Arousal: Alert Behavior During Therapy: WFL for tasks assessed/performed Cognition: Cognition impaired           Executive functioning impairment (select all impairments): Sequencing OT - Cognition Comments: Pt fully oriented but needed much repetition at times to follow commands.                 Following commands: Impaired Following commands impaired: Follows one step commands with increased time (and increased cuing)     Cueing  General Comments   Cueing  Techniques: Tactile cues;Verbal cues;Gestural cues      Exercises     Shoulder Instructions      Home Living Family/patient expects to be discharged to:: Private residence Living Arrangements: Children Available Help at Discharge: Family;Available 24 hours/day Type of Home: House Home Access: Stairs to enter [employer] of Steps: 2 in front 4 in the back Entrance Stairs-Rails: Right;Left Home Layout: One level     Bathroom Shower/Tub: Tub/shower unit;Sponge bathes at baseline   [employer]: Standard Bathroom Accessibility: Yes   Home Equipment: Agricultural consultant (2 wheels);Cane - single point;BSC/3in1;Grab bars - toilet;Cane - quad          Prior Functioning/Environment Prior Level of Function : Needs assist       Physical Assist : ADLs (physical)   ADLs (physical): IADLs Mobility Comments: household ambulation using SPC and leaning on nearby objects. Reportedly has been  using RW in the house more recently. RW used for long distances. ADLs Comments: Pt reports independence with ADL's and assist for IADL's.    OT Problem List: Decreased strength;Decreased activity tolerance;Impaired balance (sitting and/or standing);Decreased cognition   OT Treatment/Interventions: Self-care/ADL training;Therapeutic exercise;DME and/or AE instruction;Therapeutic activities;Cognitive remediation/compensation;Patient/family education;Balance training      OT Goals(Current goals can be found in the care plan section)   Acute Rehab OT Goals Patient Stated Goal: return home OT Goal Formulation: With patient/family Time For Goal Achievement: 12/08/23 Potential to Achieve Goals: Good   OT Frequency:  Min 3X/week  End of Session Equipment Utilized During Treatment: Rolling walker (2 wheels);Gait belt  Activity Tolerance: Patient tolerated treatment well Patient left: in chair;with call bell/phone within reach;with  family/visitor present  OT Visit Diagnosis: Unsteadiness on feet (R26.81);Other abnormalities of gait and mobility (R26.89);History of falling (Z91.81);Muscle weakness (generalized) (M62.81);Other symptoms and signs involving cognitive function                Time: 8981-8960 OT Time Calculation (min): 21 min Charges:  OT General Charges $OT Visit: 1 Visit OT Evaluation $OT Eval Low Complexity: 1 Low  Kelley Knoth OT, MOT   Jayson Person 11/24/2023, 1:16 PM

## 2023-11-25 DIAGNOSIS — J189 Pneumonia, unspecified organism: Secondary | ICD-10-CM | POA: Diagnosis not present

## 2023-11-25 DIAGNOSIS — I480 Paroxysmal atrial fibrillation: Secondary | ICD-10-CM | POA: Diagnosis not present

## 2023-11-25 DIAGNOSIS — I10 Essential (primary) hypertension: Secondary | ICD-10-CM | POA: Diagnosis not present

## 2023-11-25 DIAGNOSIS — N1831 Chronic kidney disease, stage 3a: Secondary | ICD-10-CM | POA: Diagnosis not present

## 2023-11-25 LAB — URINALYSIS, ROUTINE W REFLEX MICROSCOPIC
Glucose, UA: 100 mg/dL — AB
Ketones, ur: NEGATIVE mg/dL
Nitrite: POSITIVE — AB
Protein, ur: >300 mg/dL — AB
Specific Gravity, Urine: >1.03 — ABNORMAL HIGH (ref 1.005–1.030)
pH: 6.5 (ref 5.0–8.0)

## 2023-11-25 LAB — URINALYSIS, MICROSCOPIC (REFLEX): Squamous Epithelial / HPF: NONE SEEN /HPF (ref 0–5)

## 2023-11-25 LAB — CULTURE, BLOOD (ROUTINE X 2)
Culture: NO GROWTH
Culture: NO GROWTH
Special Requests: ADEQUATE
Special Requests: ADEQUATE

## 2023-11-25 LAB — BASIC METABOLIC PANEL WITH GFR
Anion gap: 7 (ref 5–15)
BUN: 45 mg/dL — ABNORMAL HIGH (ref 8–23)
CO2: 23 mmol/L (ref 22–32)
Calcium: 8.9 mg/dL (ref 8.9–10.3)
Chloride: 105 mmol/L (ref 98–111)
Creatinine, Ser: 2.45 mg/dL — ABNORMAL HIGH (ref 0.44–1.00)
GFR, Estimated: 18 mL/min — ABNORMAL LOW (ref >60)
Glucose, Bld: 108 mg/dL — ABNORMAL HIGH (ref 70–99)
Potassium: 5.3 mmol/L — ABNORMAL HIGH (ref 3.5–5.1)
Sodium: 135 mmol/L (ref 135–145)

## 2023-11-25 MED ORDER — SODIUM CHLORIDE 0.9 % IV SOLN
INTRAVENOUS | Status: AC
  Administered 2023-11-25: 1000 mL via INTRAVENOUS

## 2023-11-25 NOTE — Progress Notes (Signed)
 Progress Note   Patient: [name] FMW:[id number] DOB: [birth year] DOA: 11/22/2023     3 DOS: the patient was seen and examined on 11/25/2023   Brief hospital admission narrative: As per H&P written by Dr. Noralee on 11/22/2023 [name] is a 89 y.o. female with medical history significant of atrial fibrillation, hypertension, and history of CVA, who presented with dyspnea and cough.  She reported having flu like symptoms for the last 5 days, that progressed into dyspnea on exertion and productive cough, associated with generalized weakness. Two days after her symptoms started she suffered a mechanical fall while trying to get out of the bed, she hit her head with the nightstand, but fortunately had no loss of consciousness. The following day she was advised by her primary care provider to be checked at the ED.  09/12 ED visit was noted hypoxemic, chest radiograph was obtained and she was diagnosed with community acquired pneumonia. Decision was made to discharge patient home with oral antibiotic therapy, levofloxacin .    Unfortunately at home she continue to rapidly deteriorate with worsening dyspnea, productive cough and generalized weakness. Not able to ambulate due to weakness and having very poor oral intake. Her daughter brought her to the hospital for further evaluation.   Assessment and plan  Right lower lobe pneumonia - Expressed feeling better - Continue IV antibiotics, mucolytic's, bronchodilator management and supportive care - Good saturation on room air appreciated.  Patient reports no nausea, no vomiting and no chest pain currently. - Hopefully ready for discharge home in the next 24-48 hours.  Chronic HFrEF (heart failure with reduced ejection fraction) (HCC) -Echocardiogram from 2022 with reduced LV systolic function 30 to 35%, global hypokinesis, RV with mild reduction in systolic function, RVPS 35 mmHg, mild dilatated LA and RA, mild MR, mild TR and moderate aortic  regurgitation.  -Continue to be judicious regarding fluid resuscitation. - Continue to follow daily weights, strict intake and output and low-sodium diet - Continue patient follow-up with cardiology service - Holding Inspra  in the setting of acute kidney injury - Condition is currently compensated and stable.  Paroxysmal atrial fibrillation (HCC) -Continue treatment with amiodarone  and continue the use of Eliquis  for secondary prevention.  Essential hypertension -Continue to hold ARB medications at the moment - Continue to follow vital signs. - Heart healthy/low-sodium diet discussed with patient. - Judicious fluid resuscitation will be provided.  Acute kidney injury on chronic kidney disease, stage 3a (HCC) - Minimize nephrotoxic agents, judicious fluid resuscitation will be provided - Bladder scan demonstrated retention and In-N-Out catheterization has been done - Will check urinalysis. - Follow-up renal function trend.  Hyperlipidemia -Continue statin.  Hypokalemia - Continue electrolyte repletion and follow trend - Will check magnesium level in AM.  Jitteriness/tremors - Most likely associated with the use of steroids - After stopping Solu-Medrol  patient reports improvement in her symptoms. - Continue monitoring.  Metabolic encephalopathy - Questionable if secondary to acute infection versus hospital-acquired delirium - Continue constant orientation - Continue supportive care - Continue current IV antibiotics and follow clinical response. - Will minimize sedative agents.  Subjective:  Continues to experience intermittent confusion as per family report; no chest pain, no nausea, no vomiting.  Mildly productive coughing spells and good saturation on room air appreciated.  Physical Exam: Vitals:   11/24/23 2019 11/24/23 2303 11/25/23 0721 11/25/23 1247  BP:  (!) 116/52  110/72  Pulse:  (!) 59  (!) 55  Resp:      Temp:  98 F (36.7 C)  97.8 F (36.6 C)  TempSrc:   Oral  Oral  SpO2: 93% 98% 98% 97%  Weight:      Height:       General exam: Alert, awake, oriented x 2 and following commands appropriately at time of evaluation; was able to recognize family and provide good information/insight of ongoing current condition. Respiratory system: No requiring oxygen supplementation and demonstrating good saturation.  Patient is not using accessory muscles.  Positive rhonchi appreciated bilaterally. Cardiovascular system: Rate controlled, no rubs, no gallops, no JVD. Gastrointestinal system: Abdomen is nondistended, soft and nontender. No organomegaly or masses felt. Normal bowel sounds heard. Central nervous system: Moving 4 limbs spontaneously.  No focal neurological deficits. Extremities: No cyanosis, clubbing or edema. Skin: No petechiae. Psychiatry:  Mood & affect appropriate.    Latest data Reviewed: Basic metabolic panel: Sodium 135, potassium 5.3, chloride 105, bicarb 23, BUN 45, creatinine 2.45 and GFR 18 Magnesium: 2.2 CBC: WBCs 4.5, hemoglobin 10.8 and platelet count 167K  Family Communication: Daughter at bedside.  Disposition: Status is: Inpatient Remains inpatient appropriate because: Continue IV antibiotics.  Anticipating discharge back home with home health services.  Time spent: 50 minutes  Author: Eric Nunnery, MD 11/25/2023 6:21 PM  For on call review [email or web address].

## 2023-11-25 NOTE — Plan of Care (Signed)
   Problem: Education: Goal: Knowledge of General Education information will improve Description Including pain rating scale, medication(s)/side effects and non-pharmacologic comfort measures Outcome: Progressing

## 2023-11-26 DIAGNOSIS — J189 Pneumonia, unspecified organism: Secondary | ICD-10-CM | POA: Diagnosis not present

## 2023-11-26 DIAGNOSIS — I10 Essential (primary) hypertension: Secondary | ICD-10-CM | POA: Diagnosis not present

## 2023-11-26 DIAGNOSIS — I480 Paroxysmal atrial fibrillation: Secondary | ICD-10-CM | POA: Diagnosis not present

## 2023-11-26 DIAGNOSIS — N1831 Chronic kidney disease, stage 3a: Secondary | ICD-10-CM | POA: Diagnosis not present

## 2023-11-26 LAB — BASIC METABOLIC PANEL WITH GFR
Anion gap: 7 (ref 5–15)
BUN: 46 mg/dL — ABNORMAL HIGH (ref 8–23)
CO2: 20 mmol/L — ABNORMAL LOW (ref 22–32)
Calcium: 8.6 mg/dL — ABNORMAL LOW (ref 8.9–10.3)
Chloride: 112 mmol/L — ABNORMAL HIGH (ref 98–111)
Creatinine, Ser: 1.93 mg/dL — ABNORMAL HIGH (ref 0.44–1.00)
GFR, Estimated: 24 mL/min — ABNORMAL LOW (ref >60)
Glucose, Bld: 91 mg/dL (ref 70–99)
Potassium: 4.7 mmol/L (ref 3.5–5.1)
Sodium: 139 mmol/L (ref 135–145)

## 2023-11-26 MED ORDER — POLYETHYLENE GLYCOL 3350 17 G PO PACK
17.0000 g | PACK | Freq: Every day | ORAL | Status: DC
  Administered 2023-11-26 – 2023-11-27 (×2): 17 g via ORAL
  Filled 2023-11-26 (×2): qty 1

## 2023-11-26 MED ORDER — IPRATROPIUM-ALBUTEROL 0.5-2.5 (3) MG/3ML IN SOLN: 3.0000 mL | Freq: Four times a day (QID) | RESPIRATORY_TRACT | Status: DC | PRN

## 2023-11-26 MED ORDER — CHLORHEXIDINE GLUCONATE CLOTH 2 % EX PADS
6.0000 | MEDICATED_PAD | Freq: Every day | CUTANEOUS | Status: DC
  Administered 2023-11-26 – 2023-11-27 (×2): 6 via TOPICAL

## 2023-11-26 MED ORDER — DM-GUAIFENESIN ER 30-600 MG PO TB12
1.0000 | ORAL_TABLET | Freq: Two times a day (BID) | ORAL | Status: DC
  Administered 2023-11-26 – 2023-11-27 (×3): 1 via ORAL
  Filled 2023-11-26 (×3): qty 1

## 2023-11-26 MED ORDER — ALBUTEROL SULFATE (2.5 MG/3ML) 0.083% IN NEBU: 2.5000 mg | INHALATION_SOLUTION | RESPIRATORY_TRACT | Status: DC | PRN

## 2023-11-26 MED ORDER — QUETIAPINE FUMARATE 25 MG PO TABS
12.5000 mg | ORAL_TABLET | Freq: Every day | ORAL | Status: DC
  Administered 2023-11-26: 12.5 mg via ORAL
  Filled 2023-11-26: qty 1

## 2023-11-26 MED ORDER — TAMSULOSIN HCL 0.4 MG PO CAPS
0.4000 mg | ORAL_CAPSULE | Freq: Every day | ORAL | Status: DC
  Administered 2023-11-26: 0.4 mg via ORAL
  Filled 2023-11-26: qty 1

## 2023-11-26 NOTE — Progress Notes (Signed)
   11/26/23 2052  Chest Physiotherapy Tx  CPT Delivery Source Patient refused   Patient is agitated, trying to get out of bed, wants to leave hospital and is confused. She will get appropriate at times, but continues to worry about getting home to her mother. She is unable to perform flutter valve and refused to try the chest vest for CPT. Patient has a good spontaneous cough and breath sounds are clear. On room air and stable.

## 2023-11-26 NOTE — NC FL2 (Signed)
 [place]  MEDICAID FL2 LEVEL OF CARE FORM     IDENTIFICATION  Patient Name: [name] Birthdate: August 26, 1928 Sex: female Admission Date (Current Location): 11/22/2023  [facility] and [place] Number:  [employer] and Address:  [facility],  [address], [place] [address]      Provider Number: [id number]  Attending Physician Name and Address:  Ricky Fines, MD  Relative Name and Phone Number:       Current Level of Care: Hospital Recommended Level of Care: Skilled Nursing Facility Prior Approval Number:    Date Approved/Denied:   PASRR Number: [id number]  Discharge Plan: SNF    Current Diagnoses: Patient Active Problem List   Diagnosis Date Noted   Right lower lobe pneumonia 11/22/2023   Bilateral hip joint arthritis 04/20/2023   Left hip pain 03/24/2023   Hyperlipidemia 05/23/2022   Chronic kidney disease, stage 3a (HCC) 05/19/2022   Chronic HFrEF (heart failure with reduced ejection fraction) (HCC) 05/17/2022   Ascending aortic aneurysm (HCC) 05/15/2021   Mild cognitive impairment 02/04/2021   Fever    Senile dementia without behavioral disturbance (HCC) 07/09/2020   Paroxysmal atrial fibrillation (HCC) 04/09/2020   Cardiomyopathy (HCC) 04/09/2020   Iron deficiency anemia due to chronic blood loss 01/12/2017   Hypothyroidism 01/06/2017   RBBB 10/12/2013   Mitral valve failure 10/09/2010   PULMONARY HYPERTENSION, MILD 09/26/2009   DIVERTICULOSIS, COLON 09/26/2009   TRANSIENT ISCHEMIC ATTACKS, HX OF 09/26/2009   History of colonic polyps 09/26/2009   ISCHEMIC COLITIS, HX OF 09/26/2009   Essential hypertension 08/26/2008    Orientation RESPIRATION BLADDER Height & Weight     Self, Time, Situation, Place  Normal Incontinent Weight: 100 lb 1.4 oz (45.4 kg) Height:  5' 6 (167.6 cm)  BEHAVIORAL SYMPTOMS/MOOD NEUROLOGICAL BOWEL NUTRITION STATUS      Continent Diet (Regular)  AMBULATORY STATUS COMMUNICATION OF NEEDS Skin    Extensive Assist Verbally Normal                       Personal Care Assistance Level of Assistance  Bathing, Dressing, Feeding Bathing Assistance: Limited assistance Feeding assistance: Independent Dressing Assistance: Limited assistance     Functional Limitations Info  Sight, Hearing, Speech Sight Info: Impaired Hearing Info: Adequate Speech Info: Adequate    SPECIAL CARE FACTORS FREQUENCY  OT (By licensed OT), PT (By licensed PT)     PT Frequency: 5 times weekly OT Frequency: 5 times weekly            Contractures Contractures Info: Not present    Additional Factors Info  Code Status, Allergies Code Status Info: DNR-Limited Allergies Info: Donepezil , Actonel  (Risedronate  Sodium), Aspirin -caffeine, Codeine , Memantine , Penicillins, Sulfonamide Derivatives           Current Medications (11/26/2023):  This is the current hospital active medication list Current Facility-Administered Medications  Medication Dose Route Frequency Provider Last Rate Last Admin   acetaminophen  (TYLENOL ) tablet 650 mg  650 mg Oral Q6H PRN Arrien, Elidia Sieving, MD       Or   acetaminophen  (TYLENOL ) suppository 650 mg  650 mg Rectal Q6H PRN Arrien, Elidia Sieving, MD       albuterol  (PROVENTIL ) (2.5 MG/3ML) 0.083% nebulizer solution 2.5 mg  2.5 mg Nebulization Q2H PRN Arrien, Mauricio Daniel, MD   2.5 mg at 11/23/23 0141   amiodarone  (PACERONE ) tablet 200 mg  200 mg Oral Daily Arrien, Mauricio Daniel, MD   200 mg at 11/26/23 [phone]  apixaban  (ELIQUIS ) tablet 2.5 mg  2.5 mg Oral BID Arrien, Mauricio Daniel, MD   2.5 mg at 11/26/23 0913   atorvastatin  (LIPITOR) tablet 10 mg  10 mg Oral Daily Arrien, Elidia Sieving, MD   10 mg at 11/26/23 9087   azithromycin  (ZITHROMAX ) tablet 500 mg  500 mg Oral Daily Arrien, Elidia Sieving, MD   500 mg at 11/26/23 9086   cefTRIAXone  (ROCEPHIN ) 1 g in sodium chloride  0.9 % 100 mL IVPB  1 g Intravenous Q24H Arrien, Mauricio Daniel, MD   Stopped at  11/26/23 0028   fluticasone  furoate-vilanterol (BREO ELLIPTA ) 100-25 MCG/ACT 1 puff  1 puff Inhalation Daily Arrien, Mauricio Daniel, MD   1 puff at 11/26/23 9266   gabapentin  (NEURONTIN ) capsule 600 mg  600 mg Oral QHS Arrien, Mauricio Daniel, MD   600 mg at 11/25/23 2200   guaiFENesin  (ROBITUSSIN) 100 MG/5ML liquid 5 mL  5 mL Oral Q4H PRN Arrien, Elidia Sieving, MD   5 mL at 11/25/23 2200   ipratropium-albuterol  (DUONEB) 0.5-2.5 (3) MG/3ML nebulizer solution 3 mL  3 mL Nebulization BID Ricky Fines, MD   3 mL at 11/26/23 0732   ondansetron  (ZOFRAN ) tablet 4 mg  4 mg Oral Q6H PRN Arrien, Mauricio Daniel, MD       Or   ondansetron  (ZOFRAN ) injection 4 mg  4 mg Intravenous Q6H PRN Arrien, Mauricio Daniel, MD       polyethylene glycol (MIRALAX  / GLYCOLAX ) packet 17 g  17 g Oral Daily Adefeso, Oladapo, DO   17 g at 11/26/23 0911     Discharge Medications: Please see discharge summary for a list of discharge medications.  Relevant Imaging Results:  Relevant Lab Results:   Additional Information SSN: [id number], [place]

## 2023-11-26 NOTE — Progress Notes (Signed)
 Progress Note   Patient: [name] FMW:[id number] DOB: Aug 03, 1928 DOA: 11/22/2023     4 DOS: the patient was seen and examined on 11/26/2023   Brief hospital admission narrative: As per H&P written by Dr. Noralee on 11/22/2023 [name] is a 89 y.o. female with medical history significant of atrial fibrillation, hypertension, and history of CVA, who presented with dyspnea and cough.  She reported having flu like symptoms for the last 5 days, that progressed into dyspnea on exertion and productive cough, associated with generalized weakness. Two days after her symptoms started she suffered a mechanical fall while trying to get out of the bed, she hit her head with the nightstand, but fortunately had no loss of consciousness. The following day she was advised by her primary care provider to be checked at the ED.  09/12 ED visit was noted hypoxemic, chest radiograph was obtained and she was diagnosed with community acquired pneumonia. Decision was made to discharge patient home with oral antibiotic therapy, levofloxacin .    Unfortunately at home she continue to rapidly deteriorate with worsening dyspnea, productive cough and generalized weakness. Not able to ambulate due to weakness and having very poor oral intake. Her daughter brought her to the hospital for further evaluation.   Assessment and plan  Right lower lobe pneumonia/physical deconditioning  - Expressed feeling better - Continue IV antibiotics, mucolytic's, bronchodilator management and supportive care -Chest physiotherapy, MetaNeb and flutter valve initiated. - Good saturation on room air appreciated.  Patient reports no nausea, no vomiting and no chest pain currently. - Hopefully ready for discharge home in the next 24-48 hours. - Patient to be discharged to skilled nursing facility once medically stable.  Chronic HFrEF (heart failure with reduced ejection fraction) (HCC) -Echocardiogram from 2022 with reduced LV systolic  function 30 to 35%, global hypokinesis, RV with mild reduction in systolic function, RVPS 35 mmHg, mild dilatated LA and RA, mild MR, mild TR and moderate aortic regurgitation.  -Continue to be judicious regarding fluid resuscitation. - Continue to follow daily weights, strict intake and output and low-sodium diet - Continue patient follow-up with cardiology service - Holding Inspra  in the setting of acute kidney injury - Condition is currently compensated and stable.  Paroxysmal atrial fibrillation (HCC) -Continue treatment with amiodarone  and continue the use of Eliquis  for secondary prevention.  Essential hypertension -Continue to hold ARB medications at the moment - Continue to follow vital signs. - Heart healthy/low-sodium diet discussed with patient. - Judicious fluid resuscitation will be provided.  Acute kidney injury on chronic kidney disease, stage 3a (HCC) - Minimize nephrotoxic agents, judicious fluid resuscitation will be provided - Bladder scan demonstrated retention and In-N-Out catheterization has been done - Will check urinalysis. - Follow-up renal function trend.  Hyperlipidemia -Continue statin.  Hypokalemia - Continue electrolyte repletion and follow trend - Will check magnesium level in AM.  Jitteriness/tremors - Most likely associated with the use of steroids - After stopping Solu-Medrol  patient reports improvement in her symptoms. - Continue monitoring.  Metabolic encephalopathy - Questionable if secondary to acute infection versus hospital-acquired delirium - Continue constant orientation - Continue supportive care - Continue current IV antibiotics and follow clinical response. - Will minimize sedative agents. - Low-dose seroquel  will be started.  Acute urinary retention - Foley catheter has been placed - Flomax  started - Follow response.  Subjective:  Still experiencing intermittent confusion and disorientation; mildly productive coughing  spells and experiencing acute urinary retention.  Physical Exam: Vitals:   11/25/23  2131 11/26/23 0620 11/26/23 0733 11/26/23 1230  BP: (!) 115/51 (!) 118/53  128/68  Pulse: (!) 58 62  63  Resp: 16     Temp:  98.2 F (36.8 C)  97.9 F (36.6 C)  TempSrc:  Oral  Oral  SpO2: 96% 94% 96% 94%  Weight:      Height:       General exam: Alert, awake, oriented x 2; still with ongoing intermittent confusion and per family members no at bedside.  Afebrile. Respiratory system: Positive rhonchi bilaterally; no wheezing, no using accessory muscles. Cardiovascular system:Rate controlled, no rubs, no gallops.  Gastrointestinal system: Abdomen is nondistended, soft and nontender.  Positive bowel sounds. Central nervous system: No focal neurological deficits. Extremities: No cyanosis, clubbing or edema. Skin: No petechiae Psychiatry: Judgement and insight appear impaired secondary to metabolic encephalopathy.  Mood & affect appropriate.   Latest data Reviewed: CBC: WBCs 4.5, hemoglobin 10.8 and platelet count 167K Basic metabolic panel: Sodium 139, potassium 4.7, chloride 112, bicarb 20, BUN 46, creatinine 1.93 and GFR 24  Family Communication: Daughter at bedside.  Disposition: Status is: Inpatient Remains inpatient appropriate because: Continue IV antibiotics.  Anticipating discharge back home with home health services.  Time spent: 50 minutes  Author: Eric Nunnery, MD 11/26/2023 2:59 PM  For on call review [email or web address].

## 2023-11-26 NOTE — TOC Progression Note (Signed)
 Transition of Care [facility]) - Progression Note    Patient Details  Name: [name] MRN: [id number] Date of Birth: 07-22-1928  Transition of Care [facility]) CM/SW Contact  Lucie Lunger, [place] Phone Number: 11/26/2023, 1:18 PM  Clinical Narrative:    CSW received multiple messages from nursing staff that pts family is requesting SNF placement at this time. CSW spoke with with both of pts daughters who state pt will need SNF placement prior to returning home. They prefer placement at [facility] if possible.   CSW spoke to [place] with [facility] who states they have no beds at this time for SNF placement.  CSW to start insurance auth facility pending and send SNF referral out for review. TOC to follow.   Expected Discharge Plan: Home w Home Health Services Barriers to Discharge: Barriers Resolved               Expected Discharge Plan and Services In-house Referral: Clinical Social Work Discharge Planning Services: CM Consult Post Acute Care Choice: Home Health Living arrangements for the past 2 months: Single Family Home                           HH Arranged: RN, PT, OT, Nurse's Aide HH Agency: [facility] Date [facility] Agency Contacted: 11/26/23   Representative spoke with at [facility] Agency: Darleene   Social Drivers of Health (SDOH) Interventions SDOH Screenings   Food Insecurity: No Food Insecurity (11/23/2023)  Housing: Low Risk  (11/23/2023)  Transportation Needs: No Transportation Needs (11/23/2023)  Utilities: Not At Risk (11/23/2023)  Alcohol Screen: Low Risk  (05/12/2022)  Depression (PHQ2-9): Low Risk  (04/20/2023)  Financial Resource Strain: Low Risk  (05/26/2022)  Physical Activity: Insufficiently Active (05/12/2022)  Social Connections: Moderately Integrated (11/23/2023)  Stress: No Stress Concern Present (05/12/2022)  Tobacco Use: Low Risk  (11/23/2023)    Readmission Risk Interventions    11/23/2023   11:28 AM  Readmission Risk Prevention Plan   Transportation Screening Complete  Home Care Screening Complete  Medication Review (RN CM) Complete

## 2023-11-26 NOTE — TOC Transition Note (Signed)
 Transition of Care [facility]) - Discharge Note   Patient Details  Name: [name] MRN: [id number] Date of Birth: 12-04-1928  Transition of Care [facility]) CM/SW Contact:  Lucie Lunger, LCSWA Phone Number: 11/26/2023, 10:13 AM  Clinical Narrative:    CSW updated that pt will likely be discharged home today. CSW updated Darleene with [facility] of plan for D/C, MD placed HH orders. CSW added [facility] agency info to AVS for pt and daughter to review at D/C. TOC signing off.   Final next level of care: Home w Home Health Services Barriers to Discharge: Barriers Resolved   Patient Goals and CMS Choice Patient states their goals for this hospitalization and ongoing recovery are:: return home CMS Medicare.gov Compare Post Acute Care list provided to:: Patient Choice offered to / list presented to : Patient, Adult Children [place] ownership interest in [facility].provided to:: Patient    Discharge Placement                       Discharge Plan and Services Additional resources added to the After Visit Summary for   In-house Referral: Clinical Social Work Discharge Planning Services: CM Consult Post Acute Care Choice: Home Health                    HH Arranged: RN, PT, OT, Nurse's Aide HH Agency: [facility] Date [facility] Agency Contacted: 11/26/23   Representative spoke with at [facility] Agency: Darleene  Social Drivers of Health (SDOH) Interventions SDOH Screenings   Food Insecurity: No Food Insecurity (11/23/2023)  Housing: Low Risk  (11/23/2023)  Transportation Needs: No Transportation Needs (11/23/2023)  Utilities: Not At Risk (11/23/2023)  Alcohol Screen: Low Risk  (05/12/2022)  Depression (PHQ2-9): Low Risk  (04/20/2023)  Financial Resource Strain: Low Risk  (05/26/2022)  Physical Activity: Insufficiently Active (05/12/2022)  Social Connections: Moderately Integrated (11/23/2023)  Stress: No Stress Concern Present (05/12/2022)  Tobacco Use: Low Risk  (11/23/2023)      Readmission Risk Interventions    11/23/2023   11:28 AM  Readmission Risk Prevention Plan  Transportation Screening Complete  Home Care Screening Complete  Medication Review (RN CM) Complete

## 2023-11-26 NOTE — Progress Notes (Signed)
 Physical Therapy Treatment Patient Details Name: [name] MRN: [id number] DOB: 25-Jul-1928 Today's Date: 11/26/2023   History of Present Illness [name] is a 89 y.o. female with medical history significant of atrial fibrillation, hypertension, and history of CVA, who presented with dyspnea and cough.   She reported having flu like symptoms for the last 5 days, that progressed into dyspnea on exertion and productive cough, associated with generalized weakness. Two days after her symptoms started she suffered a mechanical fall while trying to get out of the bed, she hit her head with the nightstand, but fortunately had no loss of consciousness. The following day she was advised by her primary care provider to be checked at the ED.   09/12 ED visit was noted hypoxemic, chest radiograph was obtained and she was diagnosed with community acquired pneumonia. Decision was made to discharge patient home with oral antibiotic therapy, levofloxacin .    PT Comments  Pt was agreeable to completing today's PT treatment. Today's treatment focused primarily on standing and seated interventions at EOB. She fatigued quickly with today's interventions as evidenced by her increased posterior lean. She was able to stand for approximately 2 minutes prior to having to sit secondary to her fatigue. She required cueing throughout today's interventions to facilitate proper exercise performance. She was left in bed with the call bell within reach, bed alarm set, and with family and visitors present. Patient will benefit from continued skilled physical therapy in hospital and recommended venue below to increase strength, balance, endurance for safe ADLs and gait.     If plan is discharge home, recommend the following: A lot of help with bathing/dressing/bathroom;A lot of help with walking and/or transfers;Help with stairs or ramp for entrance;Assist for transportation   Can travel by private vehicle     Yes  Equipment  Recommendations  None recommended by PT    Recommendations for Other Services       Precautions / Restrictions Precautions Precautions: Fall Recall of Precautions/Restrictions: Impaired Restrictions Weight Bearing Restrictions Per Provider Order: No     Mobility  Bed Mobility Overal bed mobility: Needs Assistance Bed Mobility: Supine to Sit     Supine to sit: Mod assist     General bed mobility comments: increased time, labored movement    Transfers Overall transfer level: Needs assistance Equipment used: Rolling walker (2 wheels) Transfers: Sit to/from Stand Sit to Stand: Mod assist                Ambulation/Gait                   Stairs             Wheelchair Mobility     Tilt Bed    Modified Rankin (Stroke Patients Only)       Balance Overall balance assessment: Needs assistance Sitting-balance support: Feet supported, No upper extremity supported Sitting balance-Leahy Scale: Poor Sitting balance - Comments: seated EOB   Standing balance support: Reliant on assistive device for balance, During functional activity, Bilateral upper extremity supported Standing balance-Leahy Scale: Poor Standing balance comment: using RW                            Communication Communication Communication: No apparent difficulties  Cognition Arousal: Alert Behavior During Therapy: WFL for tasks assessed/performed   PT - Cognitive impairments: No apparent impairments  Following commands: Impaired Following commands impaired: Follows one step commands with increased time    Cueing Cueing Techniques: Tactile cues, Verbal cues, Gestural cues  Exercises General Exercises - Lower Extremity Ankle Circles/Pumps: AROM, Supine, 15 reps Long Arc Quad: Both, 20 reps, Seated Hip Flexion/Marching: Both, 20 reps, Seated Toe Raises: Seated, Both, 20 reps Heel Raises: Seated, Both, 20 reps    General  Comments        Pertinent Vitals/Pain Pain Assessment Pain Assessment: 0-10 Pain Score: 1  Pain Location: right shoulder Pain Descriptors / Indicators: Other (Comment) (Pt was unable to decribe her pain) Pain Intervention(s): Monitored during session, Repositioned    Home Living                          Prior Function            PT Goals (current goals can now be found in the care plan section) Acute Rehab PT Goals Patient Stated Goal: return home with family to assist PT Goal Formulation: With patient/family Time For Goal Achievement: 11/26/23 Potential to Achieve Goals: Good Progress towards PT goals: Progressing toward goals    Frequency    Min 3X/week      PT Plan      Co-evaluation              AM-PAC PT 6 Clicks Mobility   Outcome Measure  Help needed turning from your back to your side while in a flat bed without using bedrails?: A Lot Help needed moving from lying on your back to sitting on the side of a flat bed without using bedrails?: A Lot Help needed moving to and from a bed to a chair (including a wheelchair)?: A Lot Help needed standing up from a chair using your arms (e.g., wheelchair or bedside chair)?: A Lot Help needed to walk in hospital room?: A Lot Help needed climbing 3-5 steps with a railing? : A Lot 6 Click Score: 12    End of Session Equipment Utilized During Treatment: Gait belt Activity Tolerance: Patient limited by fatigue Patient left: in bed;with call bell/phone within reach;with bed alarm set;with family/visitor present   PT Visit Diagnosis: Unsteadiness on feet (R26.81);Other abnormalities of gait and mobility (R26.89);Muscle weakness (generalized) (M62.81)     Time: 8988-8955 PT Time Calculation (min) (ACUTE ONLY): 33 min  Charges:    $Therapeutic Exercise: 8-22 mins $Therapeutic Activity: 8-22 mins PT General Charges $$ ACUTE PT VISIT: 1 Visit                     Lacinda Fass, PT, DPT   11/26/2023, 12:13 PM

## 2023-11-27 DIAGNOSIS — J189 Pneumonia, unspecified organism: Secondary | ICD-10-CM | POA: Diagnosis not present

## 2023-11-27 DIAGNOSIS — N1831 Chronic kidney disease, stage 3a: Secondary | ICD-10-CM | POA: Diagnosis not present

## 2023-11-27 DIAGNOSIS — I10 Essential (primary) hypertension: Secondary | ICD-10-CM | POA: Diagnosis not present

## 2023-11-27 DIAGNOSIS — I480 Paroxysmal atrial fibrillation: Secondary | ICD-10-CM | POA: Diagnosis not present

## 2023-11-27 MED ORDER — ALPRAZOLAM 0.25 MG PO TABS: 0.2500 mg | ORAL_TABLET | Freq: Two times a day (BID) | ORAL | 0 refills | Status: DC | PRN

## 2023-11-27 MED ORDER — TRAZODONE HCL 50 MG PO TABS: 50.0000 mg | ORAL_TABLET | Freq: Every day | ORAL | 1 refills | Status: DC

## 2023-11-27 MED ORDER — TAMSULOSIN HCL 0.4 MG PO CAPS: 0.4000 mg | ORAL_CAPSULE | Freq: Every day | ORAL | 0 refills | Status: DC

## 2023-11-27 MED ORDER — FUROSEMIDE 20 MG PO TABS: 20.0000 mg | ORAL_TABLET | ORAL | 1 refills | Status: DC

## 2023-11-27 MED ORDER — ACETAMINOPHEN 325 MG PO TABS: 650.0000 mg | ORAL_TABLET | Freq: Four times a day (QID) | ORAL | Status: AC | PRN

## 2023-11-27 MED ORDER — GABAPENTIN 300 MG PO CAPS: 300.0000 mg | ORAL_CAPSULE | Freq: Every day | ORAL | 2 refills | Status: DC

## 2023-11-27 NOTE — TOC Transition Note (Signed)
 Transition of Care [facility]) - Discharge Note   Patient Details  Name: [name] MRN: [id number] Date of Birth: Sep 27, 1928  Transition of Care [facility]) CM/SW Contact:  Lucie Lunger, LCSWA Phone Number: 11/27/2023, 2:45 PM  Clinical Narrative:    CSW updated by Tammy with [facility] that insurance shara has been approved (ID: [id number]). CSW spoke to [place] with [facility] who states pt can arrive to facility today. CSW updated MD, D/C to be completed at this time. CSW to send D/C clinicals to facility via HUB. CSW updated pts daughter of plan for D/C today. RN to be provided with room and report once D/C is completed. TOC signing off.   Final next level of care: Skilled Nursing Facility Barriers to Discharge: Barriers Resolved   Patient Goals and CMS Choice Patient states their goals for this hospitalization and ongoing recovery are:: go to SNF CMS Medicare.gov Compare Post Acute Care list provided to:: Patient Choice offered to / list presented to : Patient, Adult Children [place] ownership interest in [facility].provided to:: Patient    Discharge Placement              Patient chooses bed at: [facility] Patient to be transferred to facility by: facility staff Name of family member notified: daughter Patient and family notified of of transfer: 11/27/23  Discharge Plan and Services Additional resources added to the After Visit Summary for   In-house Referral: Clinical Social Work Discharge Planning Services: CM Consult Post Acute Care Choice: Home Health                    HH Arranged: RN, PT, OT, Nurse's Aide HH Agency: [facility] Date [facility] Agency Contacted: 11/26/23   Representative spoke with at [facility] Agency: Darleene  Social Drivers of Health (SDOH) Interventions SDOH Screenings   Food Insecurity: No Food Insecurity (11/23/2023)  Housing: Low Risk  (11/23/2023)  Transportation Needs: No Transportation Needs (11/23/2023)  Utilities: Not At Risk (11/23/2023)   Alcohol Screen: Low Risk  (05/12/2022)  Depression (PHQ2-9): Low Risk  (04/20/2023)  Financial Resource Strain: Low Risk  (05/26/2022)  Physical Activity: Insufficiently Active (05/12/2022)  Social Connections: Moderately Integrated (11/23/2023)  Stress: No Stress Concern Present (05/12/2022)  Tobacco Use: Low Risk  (11/23/2023)     Readmission Risk Interventions    11/23/2023   11:28 AM  Readmission Risk Prevention Plan  Transportation Screening Complete  Home Care Screening Complete  Medication Review (RN CM) Complete

## 2023-11-27 NOTE — Discharge Instructions (Signed)
 1)Avoid ibuprofen/Advil/Aleve/Motrin/Goody Powders/Naproxen/BC powders/Meloxicam/Diclofenac/Indomethacin and other Nonsteroidal anti-inflammatory medications as these will make you more likely to bleed and can cause stomach ulcers, can also cause Kidney problems.   2)Watch for bleeding while on Blood Thinners--watch for blood in your stool which can make your stool black, maroon, mahogany or red---, blood in your urine which can make your urine pink or red, nosebleeds , also watch for possible bruising -You are taking Apixaban /Eliquis --- which is a blood thinner--- be careful to avoid injury or falls  3)Please follow-up with Urologist Dr. Sherrilee in about 10 to 14 days in his office --- for Foley catheter removal and voiding trial due to acute urinary retention----[facility], [address], [place] [address] Phone Number----[phone]  4)Routine Foley Catheter Care

## 2023-11-27 NOTE — Progress Notes (Signed)
 Report called to [facility].

## 2023-11-27 NOTE — Discharge Summary (Signed)
 [name], is a 89 y.o. female  DOB December 31, 1928  MRN [id number].  Admission date:  11/22/2023  Admitting Physician  Mauricio Toribio Furnace, MD  Discharge Date:  11/27/2023   Primary MD  Zollie Lowers, MD  Recommendations for primary care physician for things to follow:  1)Avoid ibuprofen/Advil/Aleve/Motrin/Goody Powders/Naproxen/BC powders/Meloxicam/Diclofenac/Indomethacin and other Nonsteroidal anti-inflammatory medications as these will make you more likely to bleed and can cause stomach ulcers, can also cause Kidney problems.   2)Watch for bleeding while on Blood Thinners--watch for blood in your stool which can make your stool black, maroon, mahogany or red---, blood in your urine which can make your urine pink or red, nosebleeds , also watch for possible bruising -You are taking Apixaban /Eliquis --- which is a blood thinner--- be careful to avoid injury or falls  3)Please follow-up with Urologist Dr. Sherrilee in about 10 to 14 days in his office --- for Foley catheter removal and voiding trial due to acute urinary retention----[facility], [address], [address], [place] [address] Phone Number----[phone]  4)Routine Foley Catheter Care  Admission Diagnosis  Pneumonia [J18.9] Weakness [R53.1] Community acquired pneumonia of right lung, unspecified part of lung [J18.9]   Discharge Diagnosis  Pneumonia [J18.9] Weakness [R53.1] Community acquired pneumonia of right lung, unspecified part of lung [J18.9]    Principal Problem:   Right lower lobe pneumonia Active Problems:   Chronic HFrEF (heart failure with reduced ejection fraction) (HCC)   Paroxysmal atrial fibrillation (HCC)   Essential hypertension   Chronic kidney disease, stage 3a (HCC)   Hyperlipidemia      Past Medical History:  Diagnosis Date   Acute kidney injury superimposed on chronic kidney disease stage IIIa  (HCC) 05/23/2022   Arthritis    Atrial fibrillation (HCC)    postoperative   COVID-19    CVA (cerebral vascular accident) (HCC)    HTN (hypertension)    MR (mitral regurgitation)    severe   NSTEMI (non-ST elevated myocardial infarction) (HCC)    Orthostatic hypotension 11/06/2011   Pericarditis    postoperative   PNA (pneumonia)     Past Surgical History:  Procedure Laterality Date   ANTERIOR AND POSTERIOR VAGINAL REPAIR  10/15/04   Dr. Rosalynn    anterior repair with perigee graft  04/16/06   posterior repair with apogee graft; lynx mid urethral sling; sacrospinous ligamnet suspension and cystoscopy; Dr. Tonlbin   MITRAL VALVE REPLACEMENT     sacral spinous ligament suspension of vagina     suprapubic cystectomy     TOTAL ABDOMINAL HYSTERECTOMY W/ BILATERAL SALPINGOOPHORECTOMY       HPI  from the history and physical done on the day of admission:   HPI: [name] is a 89 y.o. female with medical history significant of atrial fibrillation, hypertension, and history of CVA, who presented with dyspnea and cough.  She reported having flu like symptoms for the last 5 days, that progressed into dyspnea on exertion and productive cough, associated with generalized weakness. Two days after  her symptoms started she suffered a mechanical fall while trying to get out of the bed, she hit her head with the nightstand, but fortunately had no loss of consciousness. The following day she was advised by her primary care provider to be checked at the ED.  09/12 ED visit was noted hypoxemic, chest radiograph was obtained and she was diagnosed with community acquired pneumonia. Decision was made to discharge patient home with oral antibiotic therapy, levofloxacin .    Unfortunately at home she continue to rapidly deteriorate with worsening dyspnea, productive cough and generalized weakness. Not able to ambulate due to weakness and having very poor oral intake. Her daughter brought her to the hospital  for further evaluation.    Review of Systems: As mentioned in the history of present illness. All other systems reviewed and are negative.    Hospital Course:     1)Right lower lobe pneumonia---CAP---POA-- Patient completed 5 days of azithromycin /Rocephin  combo - She was also treated with mucolytics and bronchodilators =-Overall much improved from a pulmonary standpoint  - No oxygen requirement  - 2)Chronic HFrEF (heart failure with reduced ejection fraction) (HCC) -Echocardiogram from 2022 with reduced LV systolic function 30 to 35%, global hypokinesis, RV with mild reduction in systolic function, RVPS 35 mmHg, mild dilatated LA and RA, mild MR, mild TR and moderate aortic regurgitation.  Stable and compensated overall - Okay to change Lasix  to 20 mg on Mondays and Fridays only until oral intake is much better then may increase frequency of Lasix    3)Paroxysmal atrial fibrillation (HCC) -Stable, continue Eliquis  for stroke prophylaxis adjusted dose for age and renal function -Continue amiodarone  for rate control   4)Essential hypertension -Resume valsartan , Lasix  adjusted as above #2   5)Acute kidney injury on chronic kidney disease, stage 3a  - creatinine on admission= 1.27  ,  baseline creatinine = 1.2    ,  creatinine is now trending down from a peak of 2.45 on 11/25/2023 -- renally adjust medications, avoid nephrotoxic agents / dehydration  / hypotension -- Renal function should continue to improve after Foley placement on 11/26/2023 to address acute urinary retention which is probably contributing to worsening renal function    Hyperlipidemia -Continue statin.    Jitteriness/tremors - Most likely associated with the use of steroids - After stopping Solu-Medrol  patient reports improvement in her symptoms. - Continue monitoring.   Metabolic encephalopathy - Questionable if secondary to acute infection versus hospital-acquired delirium - Continue constant orientation -  Continue supportive care - Overall improved   Acute urinary retention - Foley placed on 11/26/2023 after In-N-Out catheterization and persistent acute urinary retention - Continue Flomax  - Keep Foley in and follow-up with Dr. Sherrilee urologist as outpatient in about 10 days or so for removal of Foley and voiding trial at that time   Generalized weakness/physical deconditioning -- -PT eval appreciated recommends SNF rehab  Anxiety disorder/insomnia--trazodone  nightly and Xanax  as needed  Discharge Condition: Stable without hypoxia  Follow UP   Contact information for follow-up providers     [facility], [facility] Follow up.   Specialty: Home Health Services Why: Agency will call to set up first home health visit. Contact information: [address] [place] [address] [phone]              Contact information for after-discharge care     Destination     [facility] .   Service: Skilled Nursing Contact information: [address] [place]   [phone]  [phone]                    Diet and Activity recommendation:  As advised  Discharge Instructions    Discharge Instructions     Call MD for:  difficulty breathing, headache or visual disturbances   Complete by: As directed    Call MD for:  persistant dizziness or light-headedness   Complete by: As directed    Call MD for:  persistant nausea and vomiting   Complete by: As directed    Call MD for:  temperature >100.4   Complete by: As directed    Diet - low sodium heart healthy   Complete by: As directed    Discharge instructions   Complete by: As directed    1)Avoid ibuprofen/Advil/Aleve/Motrin/Goody Powders/Naproxen/BC powders/Meloxicam/Diclofenac/Indomethacin and other Nonsteroidal anti-inflammatory medications as these will make you more likely to bleed and can cause stomach ulcers, can also cause Kidney problems.   2)Watch for bleeding while on  Blood Thinners--watch for blood in your stool which can make your stool black, maroon, mahogany or red---, blood in your urine which can make your urine pink or red, nosebleeds , also watch for possible bruising -You are taking Apixaban /Eliquis --- which is a blood thinner--- be careful to avoid injury or falls  3)Please follow-up with Urologist Dr. Sherrilee in about 10 to 14 days in his office --- for Foley catheter removal and voiding trial due to acute urinary retention----[facility], [address], [place] [address] Phone Number----[phone]  4)Routine Foley Catheter Care   Increase activity slowly   Complete by: As directed         Discharge Medications     Allergies as of 11/27/2023       Reactions   Donepezil  Other (See Comments)   bradycardia   Actonel  [risedronate  Sodium] Nausea And Vomiting   Aspirin -caffeine Other (See Comments)   Upset stomach   Codeine  Other (See Comments)   Upset stomach   Memantine  Other (See Comments)   Unknown    Penicillins Other (See Comments)   Unknown    Sulfonamide Derivatives Other (See Comments)   Swollen tongue        Medication List     STOP taking these medications    levofloxacin  250 MG tablet Commonly known as: LEVAQUIN        TAKE these medications    acetaminophen  325 MG tablet Commonly known as: TYLENOL  Take 2 tablets (650 mg total) by mouth every 6 (six) hours as needed for mild pain (pain score 1-3) or fever (or Fever >/= 101). What changed:  medication strength how much to take when to take this reasons to take this   ALPRAZolam  0.25 MG tablet Commonly known as: Xanax  Take 1 tablet (0.25 mg total) by mouth 2 (two) times daily as needed for anxiety.   amiodarone  200 MG tablet Commonly known as: PACERONE  Take 1 tablet by mouth once daily   ammonium lactate 12 % cream Commonly known as: AMLACTIN Apply 1 Application topically as needed for dry skin.   atorvastatin  10 MG  tablet Commonly known as: LIPITOR Take 1 tablet by mouth once daily   Biotin 1000 MCG tablet Take 1,000 mcg by mouth daily.   cholecalciferol 25 MCG (1000 UNIT) tablet Commonly known as: VITAMIN D3 Take 1,000 Units by mouth daily.   dextromethorphan -guaiFENesin  30-600 MG 12hr tablet Commonly known as: MUCINEX  DM Take 1 tablet by mouth 2 (two) times daily as needed for cough.  Eliquis  2.5 MG Tabs tablet Generic drug: apixaban  Take 1 tablet by mouth twice daily   eplerenone  25 MG tablet Commonly known as: INSPRA  Take 1 tablet by mouth once daily   furosemide  20 MG tablet Commonly known as: LASIX  Take 1 tablet (20 mg total) by mouth See admin instructions. On Monday and Friday Mornings What changed:  when to take this additional instructions   gabapentin  300 MG capsule Commonly known as: NEURONTIN  Take 1 capsule (300 mg total) by mouth at bedtime. For hip and thigh pain. What changed: how much to take   tamsulosin  0.4 MG Caps capsule Commonly known as: FLOMAX  Take 1 capsule (0.4 mg total) by mouth daily after supper.   traZODone  50 MG tablet Commonly known as: DESYREL  Take 1 tablet (50 mg total) by mouth at bedtime.   valsartan  80 MG tablet Commonly known as: DIOVAN  Take 1 tablet (80 mg total) by mouth daily.        Major procedures and Radiology Reports - PLEASE review detailed and final reports for all details, in brief -    DG Chest 2 View Result Date: 11/22/2023 CLINICAL DATA:  Pneumonia. EXAM: CHEST - 2 VIEW COMPARISON:  Chest CT dated 11/20/2023. FINDINGS: No focal consolidation, pleural effusion or pneumothorax. Stable cardiomegaly. Atherosclerotic calcification of the aorta. Mediastinal calcified granuloma. No acute osseous pathology. IMPRESSION: 1. No active cardiopulmonary disease. 2. Cardiomegaly. Electronically Signed   By: Vanetta Chou M.D.   On: 11/22/2023 17:33   DG Hip Unilat W or Wo Pelvis 2-3 Views Left Result Date: 11/20/2023 CLINICAL  DATA:  cough, fall EXAM: DG HIP (WITH OR WITHOUT PELVIS) 2-3V LEFT COMPARISON:  March 23, 2023 FINDINGS: No evidence of pelvic fracture or diastasis.No acute hip fracture or dislocation.Mild bilateral hip osteoarthritis, unchanged. Multilevel degenerative disc disease of the spine. Diffuse aortoiliac and peripheral vascular atherosclerosis. Surgical clips in the right hip region. IMPRESSION: No acute fracture, pelvic bone diastasis, or dislocation. Electronically Signed   By: Rogelia Myers M.D.   On: 11/20/2023 19:33   DG Chest 2 View Result Date: 11/20/2023 CLINICAL DATA:  cough, fall EXAM: CHEST - 2 VIEW COMPARISON:  May 23, 2022 FINDINGS: Patchy opacities in the right middle lobe. No pleural effusion or pneumothorax. Mild cardiomegaly. Left upper lobe calcified granuloma with calcified hilar lymph nodes. Tortuous aorta with aortic atherosclerosis. No acute fracture or destructive lesions. Multilevel thoracic osteophytosis. IMPRESSION: Patchy opacities in the right middle lobe, which may represent atelectasis or a developing bronchopneumonia, in the correct clinical context. Electronically Signed   By: Rogelia Myers M.D.   On: 11/20/2023 19:31   CT Cervical Spine Wo Contrast Result Date: 11/20/2023 EXAM: CT CERVICAL SPINE WITHOUT CONTRAST 11/20/2023 06:41:44 PM TECHNIQUE: CT of the cervical spine was performed without the administration of intravenous contrast. Multiplanar reformatted images are provided for review. Automated exposure control, iterative reconstruction, and/or weight based adjustment of the mA/kV was utilized to reduce the radiation dose to as low as reasonably achievable. COMPARISON: None available. CLINICAL HISTORY: Fall. Pt w family, advises she fell yesterday morning approx 0230. Bruising noted to L periorbital area, hematoma to L side of posterior head. Pt presents w productive cough, advises she got it at another office. FINDINGS: CERVICAL SPINE: BONES AND ALIGNMENT:  Straightening of the normal cervical lordosis. 2 mm anterolisthesis of C7 on T1. Trace anterolisthesis of T1 on T2 and T2 on T3. Mild levo curvature of the cervical spine. DEGENERATIVE CHANGES: Intervertebral disc space narrowing at multiple levels, most pronounced  at C6-7. Uncovertebral hypertrophy most pronounced at C6-7 and on the right at C3-4 and C4-5. Disc osteophyte complexes at multiple levels. No high-grade osseous spinal canal stenosis. Facet arthrosis and uncovertebral hypertrophy contributing to foraminal stenosis at multiple levels, most pronounced on the right at C3-4 and C4-5. SOFT TISSUES: Thyroid  nodules measuring up to 1.0 cm. LUNGS: Calcified nodule in the left lung apex likely reflecting a calcified granuloma. Biapical pleural parenchymal scarring. IMPRESSION: 1. No acute abnormality of the cervical spine. 2. Degenerative changes as above. Electronically signed by: Donnice Mania MD 11/20/2023 07:02 PM EDT RP Workstation: [id number]   CT Maxillofacial Wo Contrast Result Date: 11/20/2023 EXAM: CT OF THE FACE WITHOUT CONTRAST 11/20/2023 06:41:44 PM TECHNIQUE: CT of the face was performed without the administration of intravenous contrast. Multiplanar reformatted images are provided for review. Automated exposure control, iterative reconstruction, and/or weight based adjustment of the mA/kV was utilized to reduce the radiation dose to as low as reasonably achievable. COMPARISON: None available. CLINICAL HISTORY: Fall L temporal injury. Pt w family, advises she fell yesterday morning approx 0230. Bruising noted to L periorbital area, hematoma to L side of posterior head. Pt presents w productive cough, advises she got it at another office. FINDINGS: FACIAL BONES: No acute maxillofacial fracture. Multiple absent maxillary teeth with associated chronic bone loss. No mandibular dislocation. ORBITS: Bilateral lens replacement. Globes are intact. The extraocular muscles and optic nerve sheath complexes  are unremarkable. Normal appearance of the intraorbital fat. SINUSES AND MASTOIDS: Mucosal thickening in the maxillary sinuses, more pronounced on the left. Mucosal thickening in the bilateral ethmoid sinuses. No air-fluid levels. SOFT TISSUES: No acute abnormality. IMPRESSION: 1. No acute maxillofacial fracture. Electronically signed by: Donnice Mania MD 11/20/2023 06:55 PM EDT RP Workstation: [id number]   CT Head Wo Contrast Result Date: 11/20/2023 EXAM: CT HEAD WITHOUT CONTRAST 11/20/2023 06:41:44 PM TECHNIQUE: CT of the head was performed without the administration of intravenous contrast. Automated exposure control, iterative reconstruction, and/or weight based adjustment of the mA/kV was utilized to reduce the radiation dose to as low as reasonably achievable. COMPARISON: MRI head 09/28/2020 CLINICAL HISTORY: Head trauma, minor (Age >= 65y). Pt w family, advises she fell yesterday morning approx 0230. Bruising noted to L periorbital area, hematoma to L side of posterior head. Pt presents w productive cough, advises she got it at another office. FINDINGS: BRAIN AND VENTRICLES: Nonspecific hypoattenuation in the periventricular and subcortical white matter, most likely representing chronic microvascular ischemic changes. Mild parenchymal volume loss. Remote lacunar infarcts in the bilateral basal ganglia. ORBITS: Bilateral lens replacement. SINUSES: Mild mucosal thickening in the ethmoid sinuses. SOFT TISSUES AND SKULL: Mild soft tissue swelling in the left parietal scalp near midline. No skull fracture. VASCULATURE: Atherosclerosis of the carotid siphon. IMPRESSION: 1. No acute intracranial abnormality related to the reported head trauma. 2. Mild soft tissue swelling in the left parietal scalp near midline. Electronically signed by: Donnice Mania MD 11/20/2023 06:49 PM EDT RP Workstation: [id number]    Micro Results    Recent Results (from the past 240 hours)  Culture, blood (Routine x 2)     Status:  None   Collection Time: 11/20/23  6:09 PM   Specimen: BLOOD  Result Value Ref Range Status   Specimen Description   Final    BLOOD LEFT ANTECUBITAL Performed at [facility], [address] [place], [place] [address]    Special Requests   Final    Blood Culture adequate volume BOTTLES DRAWN AEROBIC AND ANAEROBIC Performed  at [facility], [address], [place], [place] [address]    Culture   Final    NO GROWTH 5 DAYS Performed at [facility], [address], [place], [place] [address]    Report Status 11/25/2023 FINAL  Final  Culture, blood (Routine x 2)     Status: None   Collection Time: 11/20/23  6:14 PM   Specimen: BLOOD RIGHT HAND  Result Value Ref Range Status   Specimen Description   Final    BLOOD RIGHT HAND Performed at [facility], [address], [place], [place] [address]    Special Requests   Final    Blood Culture adequate volume BOTTLES DRAWN AEROBIC AND ANAEROBIC Performed at [facility], [address], [place], [place] [address]    Culture   Final    NO GROWTH 5 DAYS Performed at [facility], [address], [place], [place] [address]    Report Status 11/25/2023 FINAL  Final  Resp panel by RT-PCR (RSV, Flu A&B, Covid) Anterior Nasal Swab     Status: None   Collection Time: 11/20/23  7:21 PM   Specimen: Anterior Nasal Swab  Result Value Ref Range Status   SARS Coronavirus 2 by RT PCR NEGATIVE NEGATIVE Final    Comment: (NOTE) SARS-CoV-2 target nucleic acids are NOT DETECTED.  The SARS-CoV-2 RNA is generally detectable in upper respiratory specimens during the acute phase of infection. The lowest concentration of SARS-CoV-2 viral copies this assay can detect is 138 copies/mL. A negative result does not preclude SARS-Cov-2 infection and should not be used as the sole basis for treatment or other patient management decisions. A negative result may occur with  improper  specimen collection/handling, submission of specimen other than nasopharyngeal swab, presence of viral mutation(s) within the areas targeted by this assay, and inadequate number of viral copies(<138 copies/mL). A negative result must be combined with clinical observations, patient history, and epidemiological information. The expected result is Negative.  Fact Sheet for Patients:  BloggerCourse.com  Fact Sheet for Healthcare Providers:  SeriousBroker.it  This test is no t yet approved or cleared by the [place]  FDA and  has been authorized for detection and/or diagnosis of SARS-CoV-2 by FDA under an Emergency Use Authorization (EUA). This EUA will remain  in effect (meaning this test can be used) for the duration of the COVID-19 declaration under Section 564(b)(1) of the Act, 21 U.S.C.section 360bbb-3(b)(1), unless the authorization is terminated  or revoked sooner.       Influenza A by PCR NEGATIVE NEGATIVE Final   Influenza B by PCR NEGATIVE NEGATIVE Final    Comment: (NOTE) The Xpert Xpress SARS-CoV-2/FLU/RSV plus assay is intended as an aid in the diagnosis of influenza from Nasopharyngeal swab specimens and should not be used as a sole basis for treatment. Nasal washings and aspirates are unacceptable for Xpert Xpress SARS-CoV-2/FLU/RSV testing.  Fact Sheet for Patients: BloggerCourse.com  Fact Sheet for Healthcare Providers: SeriousBroker.it  This test is not yet approved or cleared by the [place]  FDA and has been authorized for detection and/or diagnosis of SARS-CoV-2 by FDA under an Emergency Use Authorization (EUA). This EUA will remain in effect (meaning this test can be used) for the duration of the COVID-19 declaration under Section 564(b)(1) of the Act, 21 U.S.C. section 360bbb-3(b)(1), unless the authorization is terminated or revoked.     Resp  Syncytial Virus by PCR NEGATIVE NEGATIVE Final    Comment: (NOTE) Fact  Sheet for Patients: BloggerCourse.com  Fact Sheet for Healthcare Providers: SeriousBroker.it  This test is not yet approved or cleared by the [place]  FDA and has been authorized for detection and/or diagnosis of SARS-CoV-2 by FDA under an Emergency Use Authorization (EUA). This EUA will remain in effect (meaning this test can be used) for the duration of the COVID-19 declaration under Section 564(b)(1) of the Act, 21 U.S.C. section 360bbb-3(b)(1), unless the authorization is terminated or revoked.  Performed at [facility], [address], [place], [place] [address]     Today   Subjective    [name] today has no new complaints No fever  Or chills   No Nausea, Vomiting or Diarrhea -- Oral intake is fair , but not great RN Cicely at bedside         Patient has been seen and examined prior to discharge   Objective   Blood pressure (!) 139/56, pulse 65, temperature 98.6 F (37 C), temperature source Oral, resp. rate 18, height 5' 6 (1.676 m), weight 45.4 kg, SpO2 92%.   Intake/Output Summary (Last 24 hours) at 11/27/2023 1527 Last data filed at 11/27/2023 1008 Gross per 24 hour  Intake 360 ml  Output 900 ml  Net -540 ml    Exam Gen:- Awake Alert, no acute distress, cooperative HEENT:- .AT, No sclera icterus Neck-Supple Neck,No JVD,.  Lungs-fair symmetrical air movement, no significant wheezing or rhonchi  CV- S1, S2 normal, irregular Abd-  +ve B.Sounds, Abd Soft, No tenderness,    Extremity/Skin:- No  edema,   good pulses Psych-affect is appropriate, oriented x3, coherent and cooperative Neuro-generalized weakness, no new focal deficits, mild tremors GU-Foley in situ with clear urine   Data Review   CBC w Diff:  Lab Results  Component Value Date   WBC 4.5 11/23/2023   HGB 10.8 (L) 11/23/2023   HGB 12.3  06/30/2023   HCT 32.6 (L) 11/23/2023   HCT 37.3 06/30/2023   PLT 167 11/23/2023   PLT 203 06/30/2023   LYMPHOPCT 9 11/22/2023   MONOPCT 8 11/22/2023   EOSPCT 0 11/22/2023   BASOPCT 0 11/22/2023   CMP:  Lab Results  Component Value Date   NA 139 11/26/2023   NA 144 06/30/2023   K 4.7 11/26/2023   CL 112 (H) 11/26/2023   CO2 20 (L) 11/26/2023   BUN 46 (H) 11/26/2023   BUN 20 06/30/2023   CREATININE 1.93 (H) 11/26/2023   PROT 6.8 11/20/2023   PROT 6.4 06/30/2023   ALBUMIN 4.1 11/20/2023   ALBUMIN 4.2 06/30/2023   BILITOT 1.0 11/20/2023   BILITOT 0.6 06/30/2023   ALKPHOS 76 11/20/2023   AST 131 (H) 11/20/2023   ALT 109 (H) 11/20/2023   Total Discharge time is about 33 minutes  Rendall Carwin M.D on 11/27/2023 at 3:27 PM  Go to [email or web address] -  for contact info  [facility] - Office  [phone]

## 2023-11-27 NOTE — Plan of Care (Signed)
  Problem: Clinical Measurements: Goal: Cardiovascular complication will be avoided Outcome: Progressing   Problem: Pain Managment: Goal: General experience of comfort will improve and/or be controlled Outcome: Progressing   Problem: Safety: Goal: Ability to remain free from injury will improve Outcome: Progressing

## 2023-11-29 ENCOUNTER — Non-Acute Institutional Stay (SKILLED_NURSING_FACILITY): Payer: Self-pay | Admitting: Internal Medicine

## 2023-11-29 ENCOUNTER — Encounter: Payer: Self-pay | Admitting: Internal Medicine

## 2023-11-29 DIAGNOSIS — J189 Pneumonia, unspecified organism: Secondary | ICD-10-CM | POA: Diagnosis not present

## 2023-11-29 DIAGNOSIS — N1831 Chronic kidney disease, stage 3a: Secondary | ICD-10-CM

## 2023-11-29 DIAGNOSIS — I5022 Chronic systolic (congestive) heart failure: Secondary | ICD-10-CM | POA: Diagnosis not present

## 2023-11-29 DIAGNOSIS — F0390 Unspecified dementia without behavioral disturbance: Secondary | ICD-10-CM | POA: Diagnosis not present

## 2023-11-29 NOTE — Patient Instructions (Signed)
 See assessment and plan under each diagnosis in the problem list and acutely for this visit

## 2023-11-29 NOTE — Assessment & Plan Note (Signed)
 CHF clinically compensated; no NVD or peripheral edema.  These potassium sparing diuretic will be discontinued because of the CKD stage IV.  Low-dose furosemide  will be employed Monday, Wednesday, and Friday with daily weight monitor.

## 2023-11-29 NOTE — Progress Notes (Unsigned)
 NURSING HOME LOCATION:  [facility] ROOM NUMBER:  136 P  CODE STATUS: DNR  PCP: Butler Der MD  This is a comprehensive admission note to this SNFperformed on this date less than 30 days from date of admission. Included are preadmission medical/surgical history; reconciled medication list; family history; social history and comprehensive review of systems.  Corrections and additions to the records were documented. Comprehensive physical exam was also performed. Additionally a clinical summary was entered for each active diagnosis pertinent to this admission in the Problem List to enhance continuity of care.  HPI: She was hospitalized 9/14 - 11/27/2023 with community-acquired pneumonia.   Actually her symptoms initially began approximately 9/7 as dyspnea and cough  with associated weakness.  2 days after the symptoms began she sustained a mechanical fall trying to get out of bed and hit her head on the nightstand.  There was no associated loss of consciousness.  She contacted her PCP who recommended ED evaluation. She was actually seen in the ED 9/12 for the symptoms and was noted to be hypoxemic; imaging revealed a right lower lobe pneumonia.  White blood count was 10,400.  She was discharged home on Levaquin . At home she continued to rapidly deteriorate with worsening dyspnea, productive cough, generalized weakness.  She was unable to ambulate due to weakness and poor oral intake.  Her daughter brought her back to the hospital for reevaluation. She completed 5 days of azithromycin /Rocephin  antibiotic combination.  She also received pulmonary toilet with mucolytics, steroids, and bronchodilators with subsequent clinical improvement.  She exhibited some jitteriness and tremors which was attributed to the use of steroids.  The symptoms improved after the Solu-Medrol  was discontinued. While hospitalized peak creatinine was 2.45 with a GFR of 18 on 9/17; creatinine had been 1.23  with a GFR of 41 when initially seen in the ED.  This was suggestive of baseline stage IIIb CKD.  Final creatinine on 9/18 was 1.93 with a GFR 24 indicating CKD stage IV. Metabolic encephalopathy was question to be secondary to the acute infection versus hospital-acquired delirium. Her diuretic employed for chronic heart failure with reduced ejection fraction was changed to 20 mg furosemide  on Mondays and Fridays until her oral intake returned to baseline. Course was complicated by urinary retention for which Foley catheter placement was necessary on 9/18.  Flomax  was continued. Urology follow-up with Dr. Sherrilee was to be 10-14 days postdischarge for Foley catheter removal and voiding trial. Normochromic, normocytic anemia was present with final H/H of 10.8/32.6.  Serially there was minimal progression of the anemia. She has a diagnosis of hypothyroidism; the most recent TSH was 2.610 on 06/30/2023. PT/OT consulted and recommended SNF placement for rehab.   Past medical and surgical history: Includes prior history of AKI superimposed on CKD stage IIIa; atrial fibrillation; history of stroke; hypertension; CAD with NSTEMI; history of pericarditis; chronic heart failure with reduced ejection fraction; and mitral regurgitation. Surgeries and procedures include anterior/posterior vaginal repair; mitral valve replacement; TAH with BSO; and suprapubic cystectomy.  Family history: reviewed, non contributory due to advanced age.  Social history: Nondrinker; non-smoker.   Review of systems: Initially she seemed to confabulate stating cannot get in my head why they moved me from over there to hear.  She initially told me she had been hospitalized because of the stroke but then subsequently corrected the diagnosis to pneumonia.  She states that she is producing clear sputum.  She denies any chest pain or shortness of breath.  She does describe occasional constipation.  She denies any dysphagia.  She has had  some discomfort in her legs when they have changed her position i.e. rolling over in bed.  She uses a rolling walker at home to ambulate.  Constitutional: No fever, significant weight change, fatigue  Eyes: No redness, discharge, pain, vision change ENT/mouth: No nasal congestion, purulent discharge, earache, change in hearing, sore throat  Cardiovascular: No chest pain, palpitations, paroxysmal nocturnal dyspnea, claudication, edema  Respiratory: No cough, sputum production, hemoptysis, DOE, significant snoring, apnea Gastrointestinal: No heartburn, dysphagia, abdominal pain, nausea /vomiting, rectal bleeding, melena, change in bowels Genitourinary: No dysuria, hematuria, pyuria, incontinence, nocturia Musculoskeletal: No joint stiffness, joint swelling, weakness, pain Dermatologic: No rash, pruritus, change in appearance of skin Neurologic: No dizziness, headache, syncope, seizures, numbness, tingling Psychiatric: No significant anxiety, depression, insomnia, anorexia Endocrine: No change in hair/skin/nails, excessive thirst, excessive hunger, excessive urination  Hematologic/lymphatic: No significant bruising, lymphadenopathy, abnormal bleeding Allergy/immunology: No itchy/watery eyes, significant sneezing, urticaria, angioedema  Physical exam:  Pertinent or positive findings: She appears her age and somewhat chronically ill.  She is exhibiting a coarse, nonproductive cough.  Her voice is gravelly and weak and breaks intermittently.  Eyebrow density is decreased.  She appears to have some caries of the remaining maxillary teeth; she is not wearing upper partial.  She is wearing the lower partial.  She has coarse rhonchi greater on the right than the left.  There is a grade 1.5 systolic crescendo/decrescendo murmur.  Second heart sound is increased.  Dorsalis pedis pulses are stronger than posterior tibial pulses.  There is no edema present.  She has severe interosseous wasting of the hands as  well as marked limb atrophy of the upper and lower extremities.  There are marked arthritic changes of the fingers in the right and a variable distribution.  Keratoses are noted at the left ankle.  There is a prominent venous pattern over the right shin more so than the left.  General appearance: Adequately nourished; no acute distress, increased work of breathing is present.   Lymphatic: No lymphadenopathy about the head, neck, axilla. Eyes: No conjunctival inflammation or lid edema is present. There is no scleral icterus. Ears:  External ear exam shows no significant lesions or deformities.   Nose:  External nasal examination shows no deformity or inflammation. Nasal mucosa are pink and moist without lesions, exudates Oral exam: Lips and gums are healthy appearing.There is no oropharyngeal erythema or exudate. Neck:  No thyromegaly, masses, tenderness noted.    Heart:  Normal rate and regular rhythm. S1 and S2 normal without gallop, murmur, click, rub.  Lungs: Chest clear to auscultation without wheezes, rhonchi, rales, rubs. Abdomen: Bowel sounds are normal.  Abdomen is soft and nontender with no organomegaly, hernias, masses. GU: Deferred  Extremities:  No cyanosis, clubbing, edema. Neurologic exam:  Strength equal  in upper & lower extremities. Balance, Rhomberg, finger to nose testing could not be completed due to clinical state Deep tendon reflexes are equal Skin: Warm & dry w/o tenting. No significant lesions or rash.  See clinical summary under each active problem in the Problem List with associated updated therapeutic plan

## 2023-11-29 NOTE — Assessment & Plan Note (Addendum)
 Note: 11/20/2023 chest films suggested patchy opacities in the right middle lobe which could be atelectasis or developing pneumonia.  The films 9/14 revealed no active pulmonary process. On exam she exhibits coarse rhonchi and nonproductive cough.  Despite this, O2 sats are excellent on room air and she denies shortness of breath.  DuoNeb treatments will be initiated.

## 2023-11-29 NOTE — Assessment & Plan Note (Signed)
 Potassium sparing diuretic held.  Furosemide , low-dose changed to Monday, Wednesday, and Friday.  Daily weights will be performed.   (NOTE: BMET was repeated at the SNF on 9/22 and revealed CKD stage IIIa with a GFR of 50 and creatinine of 1.05)

## 2023-11-30 ENCOUNTER — Other Ambulatory Visit (HOSPITAL_COMMUNITY)
Admission: RE | Admit: 2023-11-30 | Discharge: 2023-11-30 | Disposition: A | Discharge status: Home / Self Care | Source: Skilled Nursing Facility | Attending: Adult Health | Admitting: Adult Health

## 2023-11-30 DIAGNOSIS — I130 Hypertensive heart and chronic kidney disease with heart failure and stage 1 through stage 4 chronic kidney disease, or unspecified chronic kidney disease: Secondary | ICD-10-CM | POA: Insufficient documentation

## 2023-11-30 LAB — CBC
HCT: 35.4 % — ABNORMAL LOW (ref 36.0–46.0)
Hemoglobin: 11.6 g/dL — ABNORMAL LOW (ref 12.0–15.0)
MCH: 31.3 pg (ref 26.0–34.0)
MCHC: 32.8 g/dL (ref 30.0–36.0)
MCV: 95.4 fL (ref 80.0–100.0)
Platelets: 244 K/uL (ref 150–400)
RBC: 3.71 MIL/uL — ABNORMAL LOW (ref 3.87–5.11)
RDW: 14.8 % (ref 11.5–15.5)
WBC: 12.7 K/uL — ABNORMAL HIGH (ref 4.0–10.5)
nRBC: 0 % (ref 0.0–0.2)

## 2023-11-30 LAB — BASIC METABOLIC PANEL WITH GFR
Anion gap: 11 (ref 5–15)
BUN: 18 mg/dL (ref 8–23)
CO2: 21 mmol/L — ABNORMAL LOW (ref 22–32)
Calcium: 8.2 mg/dL — ABNORMAL LOW (ref 8.9–10.3)
Chloride: 106 mmol/L (ref 98–111)
Creatinine, Ser: 1.05 mg/dL — ABNORMAL HIGH (ref 0.44–1.00)
GFR, Estimated: 50 mL/min — ABNORMAL LOW (ref >60)
Glucose, Bld: 77 mg/dL (ref 70–99)
Potassium: 3.5 mmol/L (ref 3.5–5.1)
Sodium: 138 mmol/L (ref 135–145)

## 2023-11-30 LAB — TSH: TSH: 3.927 u[IU]/mL (ref 0.350–4.500)

## 2023-11-30 NOTE — Assessment & Plan Note (Signed)
 Initially she stated she had been hospitalized for stroke but then corrected the presumed diagnosis to pneumonia.  She also expressed lack of understanding of why I was moved from over there to over here indicating she did not understand she had been admitted to the SNF for rehab.Speech therapy. MMSE will be performed at the SNF by

## 2023-12-09 ENCOUNTER — Non-Acute Institutional Stay (SKILLED_NURSING_FACILITY): Payer: Self-pay | Admitting: Adult Health

## 2023-12-09 ENCOUNTER — Encounter: Payer: Self-pay | Admitting: Adult Health

## 2023-12-09 DIAGNOSIS — R634 Abnormal weight loss: Secondary | ICD-10-CM

## 2023-12-09 NOTE — Progress Notes (Signed)
 Location:  [facility] Room Number: 136 Place of Service:  SNF (31)   CODE STATUS: dnr   Allergies  Allergen Reactions   Donepezil  Other (See Comments)    bradycardia   Actonel  [Risedronate  Sodium] Nausea And Vomiting   Aspirin -Caffeine Other (See Comments)    Upset stomach    Codeine  Other (See Comments)    Upset stomach   Memantine  Other (See Comments)    Unknown    Penicillins Other (See Comments)    Unknown    Sulfonamide Derivatives Other (See Comments)    Swollen tongue    Chief Complaint  Patient presents with   Acute Visit    Weight loss     HPI:  She is experiencing weight loss. Her weight on 11-24-23 was 100.6 pounds with her current weight of 95.4 pounds. She has a poor appetite; and does not feel hungry. There are no reports of pain present.   Past Medical History:  Diagnosis Date   Acute kidney injury superimposed on chronic kidney disease stage IIIa (HCC) 05/23/2022   Arthritis    Atrial fibrillation (HCC)    postoperative   COVID-19    CVA (cerebral vascular accident) (HCC)    HTN (hypertension)    MR (mitral regurgitation)    severe   NSTEMI (non-ST elevated myocardial infarction) (HCC)    Orthostatic hypotension 11/06/2011   Pericarditis    postoperative   PNA (pneumonia)     Past Surgical History:  Procedure Laterality Date   ANTERIOR AND POSTERIOR VAGINAL REPAIR  10/15/04   Dr. Rosalynn    anterior repair with perigee graft  04/16/06   posterior repair with apogee graft; lynx mid urethral sling; sacrospinous ligamnet suspension and cystoscopy; Dr. Tonlbin   MITRAL VALVE REPLACEMENT     sacral spinous ligament suspension of vagina     suprapubic cystectomy     TOTAL ABDOMINAL HYSTERECTOMY W/ BILATERAL SALPINGOOPHORECTOMY      Social History   Socioeconomic History   Marital status: Widowed    Spouse name: Not on file   Number of children: 3   Years of education: Not on file   Highest education level: 9th grade   Occupational History   Occupation: Retired  Tobacco Use   Smoking status: Never   Smokeless tobacco: Never  Vaping Use   Vaping status: Never Used  Substance and Sexual Activity   Alcohol use: No   Drug use: No   Sexual activity: Not on file  Other Topics Concern   Not on file  Social History Narrative   Lives with son -  2 daughters live nearby and visit frequently, they help with grocery shopping, etc   Right Handed    Drinks 2-3  cups caffeine daily   Social Drivers of Health   Financial Resource Strain: Low Risk  (05/26/2022)   Overall Financial Resource Strain (CARDIA)    Difficulty of Paying Living Expenses: Not very hard  Food Insecurity: No Food Insecurity (11/23/2023)   Hunger Vital Sign    Worried About Running Out of Food in the Last Year: Never true    Ran Out of Food in the Last Year: Never true  Transportation Needs: No Transportation Needs (11/23/2023)   PRAPARE - Administrator, Civil Service (Medical): No    Lack of Transportation (Non-Medical): No  Physical Activity: Insufficiently Active (05/12/2022)   Exercise Vital Sign    Days of Exercise per Week: 3 days    Minutes  of Exercise per Session: 30 min  Stress: No Stress Concern Present (05/12/2022)   [employer] of Occupational Health - Occupational Stress Questionnaire    Feeling of Stress : Only a little  Social Connections: Moderately Integrated (11/23/2023)   Social Connection and Isolation Panel    Frequency of Communication with Friends and Family: More than three times a week    Frequency of Social Gatherings with Friends and Family: More than three times a week    Attends Religious Services: More than 4 times per year    Active Member of [employer] or Organizations: Yes    Attends Banker Meetings: More than 4 times per year    Marital Status: Widowed  Intimate Partner Violence: Not At Risk (11/23/2023)   Humiliation, Afraid, Rape, and Kick questionnaire    Fear of Current or  Ex-Partner: No    Emotionally Abused: No    Physically Abused: No    Sexually Abused: No   Family History  Problem Relation Age of Onset   Heart disease Father    Lupus Father    Heart attack Father    Heart disease Mother    Colon cancer Brother    Heart disease Brother       VITAL SIGNS BP (!) 142/50   Pulse 70   Temp 98 F (36.7 C)   Resp 17   Ht 5' 3.5 (1.613 m)   Wt 95 lb 6.4 oz (43.3 kg)   SpO2 96%   BMI 16.63 kg/m   Outpatient Encounter Medications as of 12/09/2023  Medication Sig   acetaminophen  (TYLENOL ) 325 MG tablet Take 2 tablets (650 mg total) by mouth every 6 (six) hours as needed for mild pain (pain score 1-3) or fever (or Fever >/= 101).   ALPRAZolam  (XANAX ) 0.25 MG tablet Take 1 tablet (0.25 mg total) by mouth 2 (two) times daily as needed for anxiety.   amiodarone  (PACERONE ) 200 MG tablet Take 1 tablet by mouth once daily   ammonium lactate (AMLACTIN) 12 % cream Apply 1 Application topically as needed for dry skin.   apixaban  (ELIQUIS ) 2.5 MG TABS tablet Take 1 tablet by mouth twice daily   atorvastatin  (LIPITOR) 10 MG tablet Take 1 tablet by mouth once daily   Biotin 1000 MCG tablet Take 1,000 mcg by mouth daily.   cholecalciferol 25 MCG (1000 UT) tablet Take 1,000 Units by mouth daily.   dextromethorphan -guaiFENesin  (MUCINEX  DM) 30-600 MG 12hr tablet Take 1 tablet by mouth 2 (two) times daily as needed for cough.   eplerenone  (INSPRA ) 25 MG tablet Take 1 tablet by mouth once daily   furosemide  (LASIX ) 20 MG tablet Take 1 tablet (20 mg total) by mouth See admin instructions. On Monday and Friday Mornings   gabapentin  (NEURONTIN ) 300 MG capsule Take 1 capsule (300 mg total) by mouth at bedtime. For hip and thigh pain.   tamsulosin  (FLOMAX ) 0.4 MG CAPS capsule Take 1 capsule (0.4 mg total) by mouth daily after supper.   traZODone  (DESYREL ) 50 MG tablet Take 1 tablet (50 mg total) by mouth at bedtime.   valsartan  (DIOVAN ) 80 MG tablet Take 1 tablet (80 mg  total) by mouth daily.   No facility-administered encounter medications on file as of 12/09/2023.     SIGNIFICANT DIAGNOSTIC EXAMS  Review of Systems  Constitutional:  Negative for malaise/fatigue.  Respiratory:  Negative for cough and shortness of breath.   Cardiovascular:  Negative for chest pain, palpitations and leg swelling.  Gastrointestinal:  Negative for abdominal pain, constipation and heartburn.  Musculoskeletal:  Negative for back pain, joint pain and myalgias.  Skin: Negative.   Neurological:  Negative for dizziness.  Psychiatric/Behavioral:  The patient is not nervous/anxious.     Physical Exam Constitutional:      General: She is not in acute distress.    Appearance: She is cachectic. She is not diaphoretic.  Neck:     Thyroid : No thyromegaly.  Cardiovascular:     Rate and Rhythm: Normal rate and regular rhythm.     Heart sounds: Normal heart sounds.  Pulmonary:     Effort: Pulmonary effort is normal. No respiratory distress.     Breath sounds: Normal breath sounds.  Abdominal:     General: Bowel sounds are normal. There is no distension.     Palpations: Abdomen is soft.     Tenderness: There is no abdominal tenderness.  Musculoskeletal:        General: Normal range of motion.     Cervical back: Neck supple.     Right lower leg: No edema.     Left lower leg: No edema.  Lymphadenopathy:     Cervical: No cervical adenopathy.  Skin:    General: Skin is warm and dry.  Neurological:     Mental Status: She is alert. Mental status is at baseline.  Psychiatric:        Mood and Affect: Mood normal.        ASSESSMENT/ PLAN:   TODAY  She continues to experience weight loss: will begin her on remeron 7.5 mg through 01-09-24 will monitor her status.   Barnie Seip NP [facility]   call [phone]

## 2023-12-11 ENCOUNTER — Encounter: Payer: Self-pay | Admitting: Adult Health

## 2023-12-11 ENCOUNTER — Non-Acute Institutional Stay (SKILLED_NURSING_FACILITY): Payer: Self-pay | Admitting: Adult Health

## 2023-12-11 DIAGNOSIS — F0390 Unspecified dementia without behavioral disturbance: Secondary | ICD-10-CM

## 2023-12-11 DIAGNOSIS — N1831 Chronic kidney disease, stage 3a: Secondary | ICD-10-CM

## 2023-12-11 DIAGNOSIS — I10 Essential (primary) hypertension: Secondary | ICD-10-CM | POA: Diagnosis not present

## 2023-12-11 NOTE — Progress Notes (Signed)
 Location:  [facility] Room Number: 136-P Place of Service:  SNF (31) Provider: Barnie Seip, NP  CODE STATUS: FULL CODE  Allergies  Allergen Reactions   Donepezil  Other (See Comments)    bradycardia   Actonel  [Risedronate  Sodium] Nausea And Vomiting   Aspirin -Caffeine Other (See Comments)    Upset stomach    Codeine  Other (See Comments)    Upset stomach   Memantine  Other (See Comments)    Unknown    Penicillins Other (See Comments)    Unknown    Sulfonamide Derivatives Other (See Comments)    Swollen tongue    Chief Complaint  Patient presents with   Care plan meeting    HPI:  We have come together for her care plan meeting. Family present. BIMS 15/15 mood 9/30: not eating well, decreased energy, nervous, some depression. Using wheelchair without falls. She requires moderate to dependent assist with her adl care. She is frequently incontinent of bowel; has foley did fail voiding trial. Dietary: D2 setup with meals; appetite poor; weight is 95.4 pounds. Gets magic cup twice daily and ensure daily. Therapy: ambulate 75 feet with rolling walker with contact guard; upper body min assist; lower body mod assist and cognition. Activities: bingo. She will continue to be followed for her chronic illnesses including:    Essential hypertension    Senile dementia without behavioral disturbance     Chronic kidney disease stage 3a  Past Medical History:  Diagnosis Date   Acute kidney injury superimposed on chronic kidney disease stage IIIa (HCC) 05/23/2022   Arthritis    Atrial fibrillation (HCC)    postoperative   COVID-19    CVA (cerebral vascular accident) (HCC)    HTN (hypertension)    MR (mitral regurgitation)    severe   NSTEMI (non-ST elevated myocardial infarction) (HCC)    Orthostatic hypotension 11/06/2011   Pericarditis    postoperative   PNA (pneumonia)     Past Surgical History:  Procedure Laterality Date   ANTERIOR AND POSTERIOR VAGINAL  REPAIR  10/15/04   Dr. Rosalynn    anterior repair with perigee graft  04/16/06   posterior repair with apogee graft; lynx mid urethral sling; sacrospinous ligamnet suspension and cystoscopy; Dr. Tonlbin   MITRAL VALVE REPLACEMENT     sacral spinous ligament suspension of vagina     suprapubic cystectomy     TOTAL ABDOMINAL HYSTERECTOMY W/ BILATERAL SALPINGOOPHORECTOMY      Social History   Socioeconomic History   Marital status: Widowed    Spouse name: Not on file   Number of children: 3   Years of education: Not on file   Highest education level: 9th grade  Occupational History   Occupation: Retired  Tobacco Use   Smoking status: Never   Smokeless tobacco: Never  Vaping Use   Vaping status: Never Used  Substance and Sexual Activity   Alcohol use: No   Drug use: No   Sexual activity: Not on file  Other Topics Concern   Not on file  Social History Narrative   Lives with son -  2 daughters live nearby and visit frequently, they help with grocery shopping, etc   Right Handed    Drinks 2-3  cups caffeine daily   Social Drivers of Health   Financial Resource Strain: Low Risk  (05/26/2022)   Overall Financial Resource Strain (CARDIA)    Difficulty of Paying Living Expenses: Not very hard  Food Insecurity: No Food Insecurity (11/23/2023)  Hunger Vital Sign    Worried About Running Out of Food in the Last Year: Never true    Ran Out of Food in the Last Year: Never true  Transportation Needs: No Transportation Needs (11/23/2023)   PRAPARE - Administrator, Civil Service (Medical): No    Lack of Transportation (Non-Medical): No  Physical Activity: Insufficiently Active (05/12/2022)   Exercise Vital Sign    Days of Exercise per Week: 3 days    Minutes of Exercise per Session: 30 min  Stress: No Stress Concern Present (05/12/2022)   [employer] of Occupational Health - Occupational Stress Questionnaire    Feeling of Stress : Only a little  Social Connections:  Moderately Integrated (11/23/2023)   Social Connection and Isolation Panel    Frequency of Communication with Friends and Family: More than three times a week    Frequency of Social Gatherings with Friends and Family: More than three times a week    Attends Religious Services: More than 4 times per year    Active Member of [employer] or Organizations: Yes    Attends Banker Meetings: More than 4 times per year    Marital Status: Widowed  Intimate Partner Violence: Not At Risk (11/23/2023)   Humiliation, Afraid, Rape, and Kick questionnaire    Fear of Current or Ex-Partner: No    Emotionally Abused: No    Physically Abused: No    Sexually Abused: No   Family History  Problem Relation Age of Onset   Heart disease Father    Lupus Father    Heart attack Father    Heart disease Mother    Colon cancer Brother    Heart disease Brother       VITAL SIGNS BP (!) 123/50   Pulse (!) 59   Temp 97.9 F (36.6 C)   Resp 20   Ht 5' 3.5 (1.613 m)   Wt 95 lb 8 oz (43.3 kg)   SpO2 96%   BMI 16.65 kg/m   Outpatient Encounter Medications as of 12/11/2023  Medication Sig   acetaminophen  (TYLENOL ) 325 MG tablet Take 2 tablets (650 mg total) by mouth every 6 (six) hours as needed for mild pain (pain score 1-3) or fever (or Fever >/= 101).   amiodarone  (PACERONE ) 200 MG tablet Take 1 tablet by mouth once daily   ammonium lactate (AMLACTIN) 12 % cream Apply 1 Application topically as needed for dry skin.   apixaban  (ELIQUIS ) 2.5 MG TABS tablet Take 1 tablet by mouth twice daily   atorvastatin  (LIPITOR) 10 MG tablet Take 1 tablet by mouth once daily   cholecalciferol 25 MCG (1000 UT) tablet Take 1,000 Units by mouth daily.   dextromethorphan -guaiFENesin  (MUCINEX  DM) 30-600 MG 12hr tablet Take 1 tablet by mouth 2 (two) times daily as needed for cough.   furosemide  (LASIX ) 20 MG tablet Take 1 tablet (20 mg total) by mouth See admin instructions. On Monday and Friday Mornings   gabapentin   (NEURONTIN ) 100 MG capsule Take 100 mg by mouth at bedtime.   mirtazapine (REMERON) 7.5 MG tablet Take 7.5 mg by mouth at bedtime.   tamsulosin  (FLOMAX ) 0.4 MG CAPS capsule Take 1 capsule (0.4 mg total) by mouth daily after supper.   traZODone  (DESYREL ) 50 MG tablet Take 1 tablet (50 mg total) by mouth at bedtime.   valsartan  (DIOVAN ) 80 MG tablet Take 1 tablet (80 mg total) by mouth daily.   ALPRAZolam  (XANAX ) 0.25 MG tablet Take 1 tablet (  0.25 mg total) by mouth 2 (two) times daily as needed for anxiety. (Patient not taking: Reported on 12/11/2023)   Biotin 1000 MCG tablet Take 1,000 mcg by mouth daily. (Patient not taking: Reported on 12/11/2023)   eplerenone  (INSPRA ) 25 MG tablet Take 1 tablet by mouth once daily (Patient not taking: Reported on 12/11/2023)   gabapentin  (NEURONTIN ) 300 MG capsule Take 1 capsule (300 mg total) by mouth at bedtime. For hip and thigh pain. (Patient not taking: Reported on 12/11/2023)   No facility-administered encounter medications on file as of 12/11/2023.     SIGNIFICANT DIAGNOSTIC EXAMS  Review of Systems  Constitutional:  Negative for malaise/fatigue.  Respiratory:  Negative for cough and shortness of breath.   Cardiovascular:  Negative for chest pain, palpitations and leg swelling.  Gastrointestinal:  Negative for abdominal pain, constipation and heartburn.  Musculoskeletal:  Negative for back pain, joint pain and myalgias.  Skin: Negative.   Neurological:  Negative for dizziness.  Psychiatric/Behavioral:  The patient is not nervous/anxious.     Physical Exam Constitutional:      General: She is not in acute distress.    Appearance: She is underweight. She is not diaphoretic.  Neck:     Thyroid : No thyromegaly.  Cardiovascular:     Rate and Rhythm: Normal rate and regular rhythm.     Heart sounds: Normal heart sounds.  Pulmonary:     Effort: Pulmonary effort is normal. No respiratory distress.     Breath sounds: Normal breath sounds.   Abdominal:     General: Bowel sounds are normal. There is no distension.     Palpations: Abdomen is soft.     Tenderness: There is no abdominal tenderness.  Musculoskeletal:        General: Normal range of motion.     Cervical back: Neck supple.     Right lower leg: No edema.     Left lower leg: No edema.  Lymphadenopathy:     Cervical: No cervical adenopathy.  Skin:    General: Skin is warm and dry.  Neurological:     Mental Status: She is alert. Mental status is at baseline.  Psychiatric:        Mood and Affect: Mood normal.      ASSESSMENT/ PLAN:  TODAY  Essential hypertension Senile dementia without behavioral disturbance Chronic kidney disease stage 3a   Will continue current medications Will continue therapy as directed Will continue to monitor her status   Time spent with patient: 35 minutes: medications; therapy; dietary.     Barnie Seip NP [facility]   call [phone]

## 2023-12-17 DIAGNOSIS — R634 Abnormal weight loss: Secondary | ICD-10-CM | POA: Insufficient documentation

## 2023-12-23 ENCOUNTER — Non-Acute Institutional Stay (SKILLED_NURSING_FACILITY): Payer: Self-pay | Admitting: Adult Health

## 2023-12-23 ENCOUNTER — Other Ambulatory Visit: Payer: Self-pay | Admitting: Adult Health

## 2023-12-23 ENCOUNTER — Encounter: Payer: Self-pay | Admitting: Adult Health

## 2023-12-23 DIAGNOSIS — J189 Pneumonia, unspecified organism: Secondary | ICD-10-CM

## 2023-12-23 DIAGNOSIS — I480 Paroxysmal atrial fibrillation: Secondary | ICD-10-CM

## 2023-12-23 DIAGNOSIS — E039 Hypothyroidism, unspecified: Secondary | ICD-10-CM

## 2023-12-23 DIAGNOSIS — I5022 Chronic systolic (congestive) heart failure: Secondary | ICD-10-CM

## 2023-12-23 MED ORDER — TAMSULOSIN HCL 0.4 MG PO CAPS: 0.4000 mg | ORAL_CAPSULE | Freq: Every day | ORAL | 0 refills | Status: DC

## 2023-12-23 MED ORDER — GABAPENTIN 100 MG PO CAPS: 100.0000 mg | ORAL_CAPSULE | Freq: Every day | ORAL | 0 refills | Status: DC

## 2023-12-23 MED ORDER — ATORVASTATIN CALCIUM 10 MG PO TABS: 10.0000 mg | ORAL_TABLET | Freq: Every day | ORAL | 0 refills | Status: DC

## 2023-12-23 MED ORDER — LOSARTAN POTASSIUM 50 MG PO TABS: 50.0000 mg | ORAL_TABLET | Freq: Every day | ORAL | 0 refills | Status: DC

## 2023-12-23 MED ORDER — AMIODARONE HCL 200 MG PO TABS: 200.0000 mg | ORAL_TABLET | Freq: Every day | ORAL | 0 refills | Status: DC

## 2023-12-23 MED ORDER — MIRTAZAPINE 7.5 MG PO TABS: 7.5000 mg | ORAL_TABLET | Freq: Every day | ORAL | 0 refills | Status: DC

## 2023-12-23 MED ORDER — APIXABAN 2.5 MG PO TABS: 2.5000 mg | ORAL_TABLET | Freq: Two times a day (BID) | ORAL | 0 refills | Status: DC

## 2023-12-23 MED ORDER — FUROSEMIDE 20 MG PO TABS: 20.0000 mg | ORAL_TABLET | ORAL | 0 refills | Status: DC

## 2023-12-23 MED ORDER — AMMONIUM LACTATE 12 % EX CREA: 1.0000 | TOPICAL_CREAM | CUTANEOUS | 0 refills | Status: AC | PRN

## 2023-12-23 MED ORDER — TRAZODONE HCL 50 MG PO TABS: 50.0000 mg | ORAL_TABLET | Freq: Every day | ORAL | 0 refills | Status: DC

## 2023-12-23 NOTE — Progress Notes (Signed)
 Location:  [facility] Room Number: 136 Place of Service:  SNF (31)   CODE STATUS: dnr   Allergies  Allergen Reactions   Donepezil  Other (See Comments)    bradycardia   Actonel  [Risedronate  Sodium] Nausea And Vomiting   Aspirin -Caffeine Other (See Comments)    Upset stomach    Codeine  Other (See Comments)    Upset stomach   Memantine  Other (See Comments)    Unknown    Penicillins Other (See Comments)    Unknown    Sulfonamide Derivatives Other (See Comments)    Swollen tongue    Chief Complaint  Patient presents with   Discharge Note    HPI:  She is being discharged to home with home health for pt/ot/rn. She will need a wheelchair; will need her prescriptions and will follow up with her medical provider. She had been hospitalized for pneumonia. She was admitted to this facility for short term rehab. Therapy: ambulate 150 feet with walker; upper and lower body min assist; brp: min assist; 5 steps with bilateral rails and contact guard.   Past Medical History:  Diagnosis Date   Acute kidney injury superimposed on chronic kidney disease stage IIIa (HCC) 05/23/2022   Arthritis    Atrial fibrillation (HCC)    postoperative   COVID-19    CVA (cerebral vascular accident) (HCC)    HTN (hypertension)    MR (mitral regurgitation)    severe   NSTEMI (non-ST elevated myocardial infarction) (HCC)    Orthostatic hypotension 11/06/2011   Pericarditis    postoperative   PNA (pneumonia)     Past Surgical History:  Procedure Laterality Date   ANTERIOR AND POSTERIOR VAGINAL REPAIR  10/15/04   Dr. Rosalynn    anterior repair with perigee graft  04/16/06   posterior repair with apogee graft; lynx mid urethral sling; sacrospinous ligamnet suspension and cystoscopy; Dr. Tonlbin   MITRAL VALVE REPLACEMENT     sacral spinous ligament suspension of vagina     suprapubic cystectomy     TOTAL ABDOMINAL HYSTERECTOMY W/ BILATERAL SALPINGOOPHORECTOMY      Social  History   Socioeconomic History   Marital status: Widowed    Spouse name: Not on file   Number of children: 3   Years of education: Not on file   Highest education level: 9th grade  Occupational History   Occupation: Retired  Tobacco Use   Smoking status: Never   Smokeless tobacco: Never  Vaping Use   Vaping status: Never Used  Substance and Sexual Activity   Alcohol use: No   Drug use: No   Sexual activity: Not on file  Other Topics Concern   Not on file  Social History Narrative   Lives with son -  2 daughters live nearby and visit frequently, they help with grocery shopping, etc   Right Handed    Drinks 2-3  cups caffeine daily   Social Drivers of Health   Financial Resource Strain: Low Risk  (05/26/2022)   Overall Financial Resource Strain (CARDIA)    Difficulty of Paying Living Expenses: Not very hard  Food Insecurity: No Food Insecurity (11/23/2023)   Hunger Vital Sign    Worried About Running Out of Food in the Last Year: Never true    Ran Out of Food in the Last Year: Never true  Transportation Needs: No Transportation Needs (11/23/2023)   PRAPARE - Administrator, Civil Service (Medical): No    Lack of Transportation (Non-Medical): No  Physical Activity: Insufficiently Active (05/12/2022)   Exercise Vital Sign    Days of Exercise per Week: 3 days    Minutes of Exercise per Session: 30 min  Stress: No Stress Concern Present (05/12/2022)   [employer] of Occupational Health - Occupational Stress Questionnaire    Feeling of Stress : Only a little  Social Connections: Moderately Integrated (11/23/2023)   Social Connection and Isolation Panel    Frequency of Communication with Friends and Family: More than three times a week    Frequency of Social Gatherings with Friends and Family: More than three times a week    Attends Religious Services: More than 4 times per year    Active Member of [employer] or Organizations: Yes    Attends Banker  Meetings: More than 4 times per year    Marital Status: Widowed  Intimate Partner Violence: Not At Risk (11/23/2023)   Humiliation, Afraid, Rape, and Kick questionnaire    Fear of Current or Ex-Partner: No    Emotionally Abused: No    Physically Abused: No    Sexually Abused: No   Family History  Problem Relation Age of Onset   Heart disease Father    Lupus Father    Heart attack Father    Heart disease Mother    Colon cancer Brother    Heart disease Brother       VITAL SIGNS BP (!) 123/92   Pulse 99   Temp 98 F (36.7 C)   Resp 20   Ht 5' 3.5 (1.613 m)   Wt 95 lb 6.4 oz (43.3 kg)   SpO2 97%   BMI 16.63 kg/m   Outpatient Encounter Medications as of 12/23/2023  Medication Sig   acetaminophen  (TYLENOL ) 325 MG tablet Take 2 tablets (650 mg total) by mouth every 6 (six) hours as needed for mild pain (pain score 1-3) or fever (or Fever >/= 101).   amiodarone  (PACERONE ) 200 MG tablet Take 1 tablet (200 mg total) by mouth daily.   ammonium lactate (AMLACTIN) 12 % cream Apply 1 Application topically as needed for dry skin.   apixaban  (ELIQUIS ) 2.5 MG TABS tablet Take 1 tablet (2.5 mg total) by mouth 2 (two) times daily.   atorvastatin  (LIPITOR) 10 MG tablet Take 1 tablet (10 mg total) by mouth daily.   cholecalciferol 25 MCG (1000 UT) tablet Take 1,000 Units by mouth daily.   dextromethorphan -guaiFENesin  (MUCINEX  DM) 30-600 MG 12hr tablet Take 1 tablet by mouth 2 (two) times daily as needed for cough.   furosemide  (LASIX ) 20 MG tablet Take 1 tablet (20 mg total) by mouth See admin instructions. On Monday, Wednesday  and Friday Mornings   gabapentin  (NEURONTIN ) 100 MG capsule Take 1 capsule (100 mg total) by mouth at bedtime.   losartan  (COZAAR ) 50 MG tablet Take 1 tablet (50 mg total) by mouth daily.   [START ON 12/26/2023] mirtazapine (REMERON) 7.5 MG tablet Take 1 tablet (7.5 mg total) by mouth at bedtime for 17 days.   tamsulosin  (FLOMAX ) 0.4 MG CAPS capsule Take 1 capsule  (0.4 mg total) by mouth daily after supper.   traZODone  (DESYREL ) 50 MG tablet Take 1 tablet (50 mg total) by mouth at bedtime.   No facility-administered encounter medications on file as of 12/23/2023.     SIGNIFICANT DIAGNOSTIC EXAMS  Review of Systems  Constitutional:  Negative for malaise/fatigue.  Respiratory:  Negative for cough and shortness of breath.   Cardiovascular:  Negative for chest pain, palpitations and  leg swelling.  Gastrointestinal:  Negative for abdominal pain, constipation and heartburn.  Musculoskeletal:  Negative for back pain, joint pain and myalgias.  Skin: Negative.   Neurological:  Negative for dizziness.  Psychiatric/Behavioral:  The patient is not nervous/anxious.      Physical Exam Constitutional:      General: She is not in acute distress.    Appearance: She is well-developed. She is not diaphoretic.  Neck:     Thyroid : No thyromegaly.  Cardiovascular:     Rate and Rhythm: Normal rate and regular rhythm.     Heart sounds: Normal heart sounds.  Pulmonary:     Effort: Pulmonary effort is normal. No respiratory distress.     Breath sounds: Normal breath sounds.  Abdominal:     General: Bowel sounds are normal. There is no distension.     Palpations: Abdomen is soft.     Tenderness: There is no abdominal tenderness.  Musculoskeletal:        General: Normal range of motion.     Cervical back: Neck supple.     Right lower leg: No edema.     Left lower leg: No edema.  Lymphadenopathy:     Cervical: No cervical adenopathy.  Skin:    General: Skin is warm and dry.  Neurological:     Mental Status: She is alert and oriented to person, place, and time.       ASSESSMENT/ PLAN:  Patient is being discharged with the following home health services:  pt/ot/rn: to evaluate and treat as indicated for gait balance strength adl training medication management.   Patient is being discharged with the following durable medical equipment:  wheelchair    Patient has been advised to f/u with their PCP in 1-2 weeks to for a transitions of care visit.  Social services at their facility was responsible for arranging this appointment.  Pt was provided with adequate prescriptions of noncontrolled medications to reach the scheduled appointment .  For controlled substances, a limited supply was provided as appropriate for the individual patient.  If the pt normally receives these medications from a pain clinic or has a contract with another physician, these medications should be received from that clinic or physician only).    A 30 day supply of her prescription medications have been sent to [facility] [place]  Time spent with patient: 40 minutes: medications; dme; home health    Barnie Seip NP [facility]  call [phone]

## 2023-12-25 ENCOUNTER — Telehealth: Payer: Self-pay

## 2023-12-25 NOTE — Telephone Encounter (Unsigned)
 Copied from CRM #[id number]. Topic: Clinical - Home Health Verbal Orders >> Dec 25, 2023  4:49 PM Mercer PEDLAR wrote: Caller/Agency: Laymon GLENWOOD Oris Corean Salome  Callback Number: [phone] Service Requested: Skilled Nursing Frequency: 1 week 8 Any new concerns about the patient? Yes [place] is asking if patient still needs to take furosemide  (LASIX ) 20 MG tablet?

## 2023-12-28 NOTE — Telephone Encounter (Signed)
 Left message on secure line per office protocol.

## 2023-12-28 NOTE — Telephone Encounter (Signed)
 Verbal orders given for skilled nursing. Can you verify if pt still needs to take furosemide ?

## 2023-12-28 NOTE — Telephone Encounter (Signed)
 Yes I can, and yes she should.

## 2023-12-29 ENCOUNTER — Telehealth: Payer: Self-pay

## 2023-12-29 ENCOUNTER — Inpatient Hospital Stay: Admitting: Family Medicine

## 2023-12-29 NOTE — Telephone Encounter (Signed)
 Called and left detailed message for [name], giving her verbal approval for PT with a frequency of 1x per week for 8 weeks.   Copied from CRM #[id number]. Topic: Clinical - Home Health Verbal Orders >> Dec 29, 2023  2:24 PM Myrick T wrote: Caller/Agency: [name] from [facility] Callback Number: [phone] Service Requested: Physical Therapy Frequency: 1x8w Any new concerns about the patient? No

## 2024-01-08 ENCOUNTER — Ambulatory Visit

## 2024-01-11 ENCOUNTER — Ambulatory Visit

## 2024-01-11 DIAGNOSIS — I4891 Unspecified atrial fibrillation: Secondary | ICD-10-CM

## 2024-01-11 DIAGNOSIS — I130 Hypertensive heart and chronic kidney disease with heart failure and stage 1 through stage 4 chronic kidney disease, or unspecified chronic kidney disease: Secondary | ICD-10-CM

## 2024-01-11 DIAGNOSIS — J181 Lobar pneumonia, unspecified organism: Secondary | ICD-10-CM | POA: Diagnosis not present

## 2024-01-11 DIAGNOSIS — I252 Old myocardial infarction: Secondary | ICD-10-CM

## 2024-01-11 DIAGNOSIS — N184 Chronic kidney disease, stage 4 (severe): Secondary | ICD-10-CM | POA: Diagnosis not present

## 2024-01-11 DIAGNOSIS — F0390 Unspecified dementia without behavioral disturbance: Secondary | ICD-10-CM

## 2024-01-11 DIAGNOSIS — D631 Anemia in chronic kidney disease: Secondary | ICD-10-CM

## 2024-01-11 DIAGNOSIS — E039 Hypothyroidism, unspecified: Secondary | ICD-10-CM

## 2024-01-11 DIAGNOSIS — I5022 Chronic systolic (congestive) heart failure: Secondary | ICD-10-CM | POA: Diagnosis not present

## 2024-01-11 DIAGNOSIS — Z8616 Personal history of COVID-19: Secondary | ICD-10-CM

## 2024-01-11 DIAGNOSIS — I319 Disease of pericardium, unspecified: Secondary | ICD-10-CM

## 2024-01-11 DIAGNOSIS — Z7901 Long term (current) use of anticoagulants: Secondary | ICD-10-CM

## 2024-01-12 ENCOUNTER — Ambulatory Visit: Payer: Self-pay | Admitting: Family Medicine

## 2024-01-14 ENCOUNTER — Other Ambulatory Visit: Payer: Self-pay | Admitting: *Deleted

## 2024-01-14 ENCOUNTER — Telehealth: Payer: Self-pay | Admitting: Family Medicine

## 2024-01-14 ENCOUNTER — Other Ambulatory Visit: Payer: Self-pay | Admitting: Cardiovascular Disease

## 2024-01-14 MED ORDER — MIRTAZAPINE 7.5 MG PO TABS: 7.5000 mg | ORAL_TABLET | Freq: Every day | ORAL | 0 refills | Status: DC

## 2024-01-14 NOTE — Telephone Encounter (Signed)
 VM from [facility] pharmacy Pt starting Pill pack w/ [facility], will not have enough of her Mirtazipine till her appt w/ PCP on 01/21/24. Requesting refill till then.

## 2024-01-20 ENCOUNTER — Telehealth: Payer: Self-pay

## 2024-01-20 ENCOUNTER — Ambulatory Visit: Admitting: Family Medicine

## 2024-01-20 ENCOUNTER — Encounter: Payer: Self-pay | Admitting: Family Medicine

## 2024-01-20 VITALS — BP 153/78 | HR 62 | Temp 97.5°F | Ht 63.5 in

## 2024-01-20 DIAGNOSIS — W19XXXA Unspecified fall, initial encounter: Secondary | ICD-10-CM | POA: Diagnosis not present

## 2024-01-20 DIAGNOSIS — S51811A Laceration without foreign body of right forearm, initial encounter: Secondary | ICD-10-CM

## 2024-01-20 DIAGNOSIS — S81812A Laceration without foreign body, left lower leg, initial encounter: Secondary | ICD-10-CM | POA: Diagnosis not present

## 2024-01-20 DIAGNOSIS — Z23 Encounter for immunization: Secondary | ICD-10-CM

## 2024-01-20 NOTE — Telephone Encounter (Signed)
 Spoke with pt's daughter per dpr. Informed her that pt has 2 skin tears from fall and that we put a clean bandage on them and that Dr Zollie will check them again tomorrow during her visit. Pt's daughter verbalized understanding no further questions.

## 2024-01-20 NOTE — Progress Notes (Signed)
 Subjective:  Patient ID: [name], female    DOB: 08-05-1928, 89 y.o.   MRN: [id number]  Patient Care Team: Zollie Lowers, MD as PCP - General (Family Medicine) Delford Maude BROCKS, MD as PCP - Cardiology (Cardiology) Delford Maude BROCKS, MD as Consulting Physician (Cardiology) Rosemarie Eather RAMAN, MD as Consulting Physician (Neurology) Jude Harden GAILS, MD as Consulting Physician (Pulmonary Disease) Lloyd Savannah, MD as Referring Physician (Dermatology)   Chief Complaint:  Fall (Fall this morning after sliding out of bed.) and Laceration (Laceration/skin tear to right wrist and left lower leg)   HPI: [name] is a 89 y.o. female presenting on 01/20/2024 for Fall (Fall this morning after sliding out of bed.) and Laceration (Laceration/skin tear to right wrist and left lower leg)   [name] is a 89 year old female who presents with skin tears on the left leg and right forearm after a fall.  Traumatic skin tears - Sustained skin tears on the left leg and right forearm after a fall while retrieving a flashlight. - Incident involved sliding off the bed and hitting a rail. - Left leg skin tear is tender.  Fall event - Fell from bed while attempting to retrieve a flashlight. - No head injury or loss of consciousness during the fall.  Bleeding risk - Currently taking blood thinners, increasing risk of bleeding with injury.  Previous falls and injuries - History of previous fall resulting in arm injury while playing in church and falling from a truck.  Immunization status - Last tetanus vaccination administered in 2019.          Relevant past medical, surgical, family, and social history reviewed and updated as indicated.  Allergies and medications reviewed and updated. Data reviewed: Chart in Epic.   Past Medical History:  Diagnosis Date   Acute kidney injury superimposed on chronic kidney disease stage IIIa (HCC) 05/23/2022   Arthritis    Atrial fibrillation  (HCC)    postoperative   COVID-19    CVA (cerebral vascular accident) (HCC)    HTN (hypertension)    MR (mitral regurgitation)    severe   NSTEMI (non-ST elevated myocardial infarction) (HCC)    Orthostatic hypotension 11/06/2011   Pericarditis    postoperative   PNA (pneumonia)     Past Surgical History:  Procedure Laterality Date   ANTERIOR AND POSTERIOR VAGINAL REPAIR  10/15/04   Dr. Rosalynn    anterior repair with perigee graft  04/16/06   posterior repair with apogee graft; lynx mid urethral sling; sacrospinous ligamnet suspension and cystoscopy; Dr. Tonlbin   MITRAL VALVE REPLACEMENT     sacral spinous ligament suspension of vagina     suprapubic cystectomy     TOTAL ABDOMINAL HYSTERECTOMY W/ BILATERAL SALPINGOOPHORECTOMY      Social History   Socioeconomic History   Marital status: Widowed    Spouse name: Not on file   Number of children: 3   Years of education: Not on file   Highest education level: 9th grade  Occupational History   Occupation: Retired  Tobacco Use   Smoking status: Never   Smokeless tobacco: Never  Vaping Use   Vaping status: Never Used  Substance and Sexual Activity   Alcohol use: No   Drug use: No   Sexual activity: Not on file  Other Topics Concern   Not on file  Social History Narrative   Lives with son -  2 daughters live nearby and visit  frequently, they help with grocery shopping, etc   Right Handed    Drinks 2-3  cups caffeine daily   Social Drivers of Health   Financial Resource Strain: Low Risk  (05/26/2022)   Overall Financial Resource Strain (CARDIA)    Difficulty of Paying Living Expenses: Not very hard  Food Insecurity: No Food Insecurity (11/23/2023)   Hunger Vital Sign    Worried About Running Out of Food in the Last Year: Never true    Ran Out of Food in the Last Year: Never true  Transportation Needs: No Transportation Needs (11/23/2023)   PRAPARE - Administrator, Civil Service (Medical): No    Lack of  Transportation (Non-Medical): No  Physical Activity: Insufficiently Active (05/12/2022)   Exercise Vital Sign    Days of Exercise per Week: 3 days    Minutes of Exercise per Session: 30 min  Stress: No Stress Concern Present (05/12/2022)   [employer] of Occupational Health - Occupational Stress Questionnaire    Feeling of Stress : Only a little  Social Connections: Moderately Integrated (11/23/2023)   Social Connection and Isolation Panel    Frequency of Communication with Friends and Family: More than three times a week    Frequency of Social Gatherings with Friends and Family: More than three times a week    Attends Religious Services: More than 4 times per year    Active Member of [employer] or Organizations: Yes    Attends Banker Meetings: More than 4 times per year    Marital Status: Widowed  Intimate Partner Violence: Not At Risk (11/23/2023)   Humiliation, Afraid, Rape, and Kick questionnaire    Fear of Current or Ex-Partner: No    Emotionally Abused: No    Physically Abused: No    Sexually Abused: No    Outpatient Encounter Medications as of 01/20/2024  Medication Sig   acetaminophen  (TYLENOL ) 325 MG tablet Take 2 tablets (650 mg total) by mouth every 6 (six) hours as needed for mild pain (pain score 1-3) or fever (or Fever >/= 101).   amiodarone  (PACERONE ) 200 MG tablet Take 1 tablet by mouth once daily   ammonium lactate (AMLACTIN) 12 % cream Apply 1 Application topically as needed for dry skin.   apixaban  (ELIQUIS ) 2.5 MG TABS tablet Take 1 tablet (2.5 mg total) by mouth 2 (two) times daily.   atorvastatin  (LIPITOR) 10 MG tablet Take 1 tablet (10 mg total) by mouth daily.   cholecalciferol 25 MCG (1000 UT) tablet Take 1,000 Units by mouth daily.   dextromethorphan -guaiFENesin  (MUCINEX  DM) 30-600 MG 12hr tablet Take 1 tablet by mouth 2 (two) times daily as needed for cough.   furosemide  (LASIX ) 20 MG tablet Take 1 tablet (20 mg total) by mouth See admin  instructions. On Monday, Wednesday  and Friday Mornings   gabapentin  (NEURONTIN ) 100 MG capsule Take 1 capsule (100 mg total) by mouth at bedtime.   losartan  (COZAAR ) 50 MG tablet Take 1 tablet (50 mg total) by mouth daily.   mirtazapine (REMERON) 7.5 MG tablet Take 1 tablet (7.5 mg total) by mouth at bedtime for 17 days.   tamsulosin  (FLOMAX ) 0.4 MG CAPS capsule Take 1 capsule (0.4 mg total) by mouth daily after supper.   traZODone  (DESYREL ) 50 MG tablet Take 1 tablet (50 mg total) by mouth at bedtime.   No facility-administered encounter medications on file as of 01/20/2024.    Allergies  Allergen Reactions   Donepezil  Other (See Comments)  bradycardia   Actonel  [Risedronate  Sodium] Nausea And Vomiting   Aspirin -Caffeine Other (See Comments)    Upset stomach    Codeine  Other (See Comments)    Upset stomach   Memantine  Other (See Comments)    Unknown    Penicillins Other (See Comments)    Unknown    Sulfonamide Derivatives Other (See Comments)    Swollen tongue    Pertinent ROS per HPI, otherwise unremarkable      Objective:  BP (!) 153/78   Pulse 62   Temp (!) 97.5 F (36.4 C) (Temporal)   Ht 5' 3.5 (1.613 m)   SpO2 95%   BMI 16.63 kg/m    Wt Readings from Last 3 Encounters:  12/23/23 95 lb 6.4 oz (43.3 kg)  12/11/23 95 lb 8 oz (43.3 kg)  12/09/23 95 lb 6.4 oz (43.3 kg)    Physical Exam Vitals and nursing note reviewed.  Constitutional:      General: She is not in acute distress.    Appearance: She is not ill-appearing, toxic-appearing or diaphoretic.     Comments: Frail elderly   HENT:     Head: Normocephalic and atraumatic.     Jaw: There is normal jaw occlusion.     Nose: Nose normal.     Mouth/Throat:     Mouth: Mucous membranes are moist.  Eyes:     General: Lids are normal. No visual field deficit.    Extraocular Movements: Extraocular movements intact.     Conjunctiva/sclera: Conjunctivae normal.     Pupils: Pupils are equal, round, and  reactive to light.  Cardiovascular:     Rate and Rhythm: Normal rate and regular rhythm.     Heart sounds: Normal heart sounds.  Pulmonary:     Effort: Pulmonary effort is normal.     Breath sounds: Normal breath sounds.  Musculoskeletal:     Cervical back: Normal range of motion and neck supple. No rigidity or tenderness.     Right lower leg: No edema.     Left lower leg: No edema.  Skin:    General: Skin is warm and dry.     Capillary Refill: Capillary refill takes less than 2 seconds.     Findings: Bruising, ecchymosis and wound present.      Neurological:     General: No focal deficit present.     Mental Status: She is alert and oriented to person, place, and time.     Gait: Gait abnormal (slow, using walker).  Psychiatric:        Behavior: Behavior is cooperative.      Results for orders placed or performed during the hospital encounter of 11/30/23  Basic metabolic panel   Collection Time: 11/30/23  8:00 AM  Result Value Ref Range   Sodium 138 135 - 145 mmol/L   Potassium 3.5 3.5 - 5.1 mmol/L   Chloride 106 98 - 111 mmol/L   CO2 21 (L) 22 - 32 mmol/L   Glucose, Bld 77 70 - 99 mg/dL   BUN 18 8 - 23 mg/dL   Creatinine, Ser 8.94 (H) 0.44 - 1.00 mg/dL   Calcium  8.2 (L) 8.9 - 10.3 mg/dL   GFR, Estimated 50 (L) >60 mL/min   Anion gap 11 5 - 15  CBC   Collection Time: 11/30/23  8:00 AM  Result Value Ref Range   WBC 12.7 (H) 4.0 - 10.5 K/uL   RBC 3.71 (L) 3.87 - 5.11 MIL/uL   Hemoglobin 11.6 (L) 12.0 -  15.0 g/dL   HCT 64.5 (L) 63.9 - 53.9 %   MCV 95.4 80.0 - 100.0 fL   MCH 31.3 26.0 - 34.0 pg   MCHC 32.8 30.0 - 36.0 g/dL   RDW 85.1 88.4 - 84.4 %   Platelets 244 150 - 400 K/uL   nRBC 0.0 0.0 - 0.2 %  TSH   Collection Time: 11/30/23  8:00 AM  Result Value Ref Range   TSH 3.927 0.350 - 4.500 uIU/mL       Pertinent labs & imaging results that were available during my care of the patient were reviewed by me and considered in my medical decision  making.  Assessment & Plan:  Aavya Shafer was seen today for fall and laceration.  Diagnoses and all orders for this visit:  Fall, initial encounter -     Td : Tetanus/diphtheria >7yo Preservative  free  Noninfected skin tear of left leg, initial encounter -     Td : Tetanus/diphtheria >7yo Preservative  free  Skin tear of right forearm without complication, initial encounter -     Td : Tetanus/diphtheria >7yo Preservative  free  Immunization due -     Td : Tetanus/diphtheria >7yo Preservative  free      Skin tears of left leg and right forearm Acute skin tears on the left leg and right forearm due to a fall. The left leg tissue is likely necrotic and will require debridement. The right forearm tissue is intact but healing will be prolonged due to inability to reposition the flap. No active bleeding. - Applied Adaptic dressing to both skin tears. - Wrapped with nonstick gauze and Coban. - Scheduled follow-up for dressing change and re-evaluation tomorrow. - Notified Doctor Stacks for re-evaluation tomorrow.  Tetanus vaccination update Tetanus vaccination last received in 2019, over five years ago. Due for an update. - Administered tetanus booster.          Continue all other maintenance medications.  Follow up plan: Return in 1 week (on 01/27/2024), or if symptoms worsen or fail to improve, for Wound Recheck.   Continue healthy lifestyle choices, including diet (rich in fruits, vegetables, and lean proteins, and low in salt and simple carbohydrates) and exercise (at least 30 minutes of moderate physical activity daily).   The above assessment and management plan was discussed with the patient. The patient verbalized understanding of and has agreed to the management plan. Patient is aware to call the clinic if they develop any new symptoms or if symptoms persist or worsen. Patient is aware when to return to the clinic for a follow-up visit. Patient educated on when it is  appropriate to go to the emergency department.   Rosaline Bruns, FNP-C [facility] [phone]

## 2024-01-20 NOTE — Telephone Encounter (Signed)
 Copied from CRM [id number]. Topic: General - Other >> Jan 20, 2024 11:44 AM Wess RAMAN wrote: Reason for CRM: Patient's daughter, Verneita, would like to know if her mother is okay after her recent visit today.  Callback #: [id number]

## 2024-01-21 ENCOUNTER — Ambulatory Visit (INDEPENDENT_AMBULATORY_CARE_PROVIDER_SITE_OTHER): Admitting: Family Medicine

## 2024-01-21 VITALS — BP 134/76 | HR 78 | Temp 97.7°F | Ht 63.5 in | Wt 95.0 lb

## 2024-01-21 DIAGNOSIS — W19XXXA Unspecified fall, initial encounter: Secondary | ICD-10-CM

## 2024-01-21 DIAGNOSIS — S51811D Laceration without foreign body of right forearm, subsequent encounter: Secondary | ICD-10-CM

## 2024-01-21 DIAGNOSIS — I480 Paroxysmal atrial fibrillation: Secondary | ICD-10-CM

## 2024-01-21 DIAGNOSIS — S81812A Laceration without foreign body, left lower leg, initial encounter: Secondary | ICD-10-CM

## 2024-01-21 DIAGNOSIS — S81812D Laceration without foreign body, left lower leg, subsequent encounter: Secondary | ICD-10-CM | POA: Diagnosis not present

## 2024-01-21 DIAGNOSIS — S51811A Laceration without foreign body of right forearm, initial encounter: Secondary | ICD-10-CM

## 2024-01-21 DIAGNOSIS — I10 Essential (primary) hypertension: Secondary | ICD-10-CM | POA: Diagnosis not present

## 2024-01-21 DIAGNOSIS — W19XXXD Unspecified fall, subsequent encounter: Secondary | ICD-10-CM | POA: Diagnosis not present

## 2024-01-21 DIAGNOSIS — M545 Low back pain, unspecified: Secondary | ICD-10-CM

## 2024-01-21 MED ORDER — MUPIROCIN 2 % EX OINT: TOPICAL_OINTMENT | CUTANEOUS | 2 refills | Status: AC

## 2024-01-21 MED ORDER — PREGABALIN 75 MG PO CAPS: 75.0000 mg | ORAL_CAPSULE | Freq: Every day | ORAL | 1 refills | Status: DC

## 2024-01-21 MED ORDER — APIXABAN 2.5 MG PO TABS: 2.5000 mg | ORAL_TABLET | Freq: Two times a day (BID) | ORAL | 3 refills | Status: AC

## 2024-01-21 MED ORDER — FUROSEMIDE 20 MG PO TABS: 20.0000 mg | ORAL_TABLET | ORAL | 3 refills | Status: AC

## 2024-01-21 MED ORDER — MIRTAZAPINE 7.5 MG PO TABS: 7.5000 mg | ORAL_TABLET | Freq: Every day | ORAL | 0 refills | Status: DC

## 2024-01-21 MED ORDER — AMIODARONE HCL 200 MG PO TABS: 200.0000 mg | ORAL_TABLET | Freq: Every day | ORAL | 2 refills | Status: AC

## 2024-01-21 MED ORDER — LOSARTAN POTASSIUM 50 MG PO TABS: 50.0000 mg | ORAL_TABLET | Freq: Every day | ORAL | 0 refills | Status: DC

## 2024-01-21 NOTE — Progress Notes (Signed)
 Subjective:  Patient ID: [name], female    DOB: [birth year]  Age: 89 y.o. MRN: [id number]  CC: Medical Management of Chronic Issues (Left leg and right arm abrasion. Saw michelle yesterday. Clemens out of the bed Wednesday morning. )   HPI  History of Present Illness  Atrial fibrillation follow up. Pt. is treated with rate control and anticoagulation. Pt.  denies palpitations, rapid rate, chest pain, dyspnea and edema. There has been no bleeding from nose or gums. Pt. has not noticed blood with urine or stool.  Although there is routine bruising easily, it is not excessive.  Skin tears noted on right forearm and left leg. Pt. Uncomfortable dressing them herself.   Back pain is continual , getting worse.          04/20/2023   11:19 AM 04/01/2023    2:41 PM 03/27/2023    4:16 PM  Depression screen PHQ 2/9  Decreased Interest 0 0 0  Down, Depressed, Hopeless 0 0 0  PHQ - 2 Score 0 0 0  Altered sleeping 0 0 0  Tired, decreased energy  0 0  Change in appetite  0 0  Feeling bad or failure about yourself   0 0  Trouble concentrating  0 0  Moving slowly or fidgety/restless  0 0  Suicidal thoughts  0 0  PHQ-9 Score 0  0  0   Difficult doing work/chores  Not difficult at all Not difficult at all     Data saved with a previous flowsheet row definition    History [name] has a past medical history of Acute kidney injury superimposed on chronic kidney disease stage IIIa (HCC) (05/23/2022), Arthritis, Atrial fibrillation (HCC), COVID-19, CVA (cerebral vascular accident) (HCC), HTN (hypertension), MR (mitral regurgitation), NSTEMI (non-ST elevated myocardial infarction) (HCC), Orthostatic hypotension (11/06/2011), Pericarditis, and PNA (pneumonia).   She has a past surgical history that includes anterior repair with perigee graft (04/16/06); Anterior and posterior vaginal repair (10/15/04); sacral spinous ligament suspension of vagina; suprapubic cystectomy; Total abdominal hysterectomy w/  bilateral salpingoophorectomy; and Mitral valve replacement.   Her family history includes Colon cancer in her brother; Heart attack in her father; Heart disease in her brother, father, and mother; Lupus in her father.She reports that she has never smoked. She has never used smokeless tobacco. She reports that she does not drink alcohol and does not use drugs.    ROS Review of Systems  Constitutional: Negative.   HENT:  Negative for congestion.   Eyes:  Negative for visual disturbance.  Respiratory:  Negative for shortness of breath.   Cardiovascular:  Negative for chest pain.  Gastrointestinal:  Negative for abdominal pain, constipation, diarrhea, nausea and vomiting.  Genitourinary:  Negative for difficulty urinating.  Musculoskeletal:  Positive for arthralgias and back pain. Negative for myalgias.  Neurological:  Negative for headaches.  Psychiatric/Behavioral:  Negative for sleep disturbance.     Objective:  BP 134/76   Pulse 78   Temp 97.7 F (36.5 C)   Ht 5' 3.5 (1.613 m)   Wt 95 lb (43.1 kg)   SpO2 92%   BMI 16.56 kg/m   BP Readings from Last 3 Encounters:  01/22/24 (!) 153/61  01/21/24 134/76  01/20/24 (!) 153/78    Wt Readings from Last 3 Encounters:  01/22/24 98 lb (44.5 kg)  01/21/24 95 lb (43.1 kg)  12/23/23 95 lb 6.4 oz (43.3 kg)     Physical Exam Constitutional:      General: She  is not in acute distress.    Appearance: She is well-developed.  Cardiovascular:     Rate and Rhythm: Normal rate and regular rhythm.  Pulmonary:     Breath sounds: Normal breath sounds.  Musculoskeletal:        General: Normal range of motion.  Skin:    General: Skin is warm and dry.  Neurological:     Mental Status: She is alert and oriented to person, place, and time.    Physical Exam    Assessment & Plan:  Essential hypertension -     Furosemide ; Take 1 tablet (20 mg total) by mouth See admin instructions. On Monday, Wednesday  and Friday Mornings   Dispense: 90 tablet; Refill: 3 -     Losartan  Potassium; Take 1 tablet (50 mg total) by mouth daily.  Dispense: 30 tablet; Refill: 0  Paroxysmal atrial fibrillation (HCC) -     Amiodarone  HCl; Take 1 tablet (200 mg total) by mouth daily.  Dispense: 90 tablet; Refill: 2 -     Apixaban ; Take 1 tablet (2.5 mg total) by mouth 2 (two) times daily.  Dispense: 180 tablet; Refill: 3 -     Furosemide ; Take 1 tablet (20 mg total) by mouth See admin instructions. On Monday, Wednesday  and Friday Mornings  Dispense: 90 tablet; Refill: 3  Fall, initial encounter -     Furosemide ; Take 1 tablet (20 mg total) by mouth See admin instructions. On Monday, Wednesday  and Friday Mornings  Dispense: 90 tablet; Refill: 3  Noninfected skin tear of left leg, initial encounter -     Furosemide ; Take 1 tablet (20 mg total) by mouth See admin instructions. On Monday, Wednesday  and Friday Mornings  Dispense: 90 tablet; Refill: 3 -     Mupirocin; Apply and cover with bandage twice daily  Dispense: 30 g; Refill: 2  Skin tear of right forearm without complication, initial encounter -     Furosemide ; Take 1 tablet (20 mg total) by mouth See admin instructions. On Monday, Wednesday  and Friday Mornings  Dispense: 90 tablet; Refill: 3 -     Mupirocin; Apply and cover with bandage twice daily  Dispense: 30 g; Refill: 2  Lumbar pain -     Furosemide ; Take 1 tablet (20 mg total) by mouth See admin instructions. On Monday, Wednesday  and Friday Mornings  Dispense: 90 tablet; Refill: 3 -     Pregabalin; Take 1 capsule (75 mg total) by mouth at bedtime.  Dispense: 90 capsule; Refill: 1  Other orders -     Mirtazapine; Take 1 tablet (7.5 mg total) by mouth at bedtime for 17 days.  Dispense: 17 tablet; Refill: 0    Assessment and Plan Assessment & Plan        Follow-up: Return in about 1 week (around 01/28/2024).  Butler Der, M.D.

## 2024-01-22 ENCOUNTER — Ambulatory Visit (INDEPENDENT_AMBULATORY_CARE_PROVIDER_SITE_OTHER): Admitting: Family Medicine

## 2024-01-22 VITALS — BP 153/61 | HR 66 | Temp 97.9°F | Ht 63.0 in | Wt 98.0 lb

## 2024-01-22 DIAGNOSIS — S51811D Laceration without foreign body of right forearm, subsequent encounter: Secondary | ICD-10-CM

## 2024-01-22 DIAGNOSIS — S81812D Laceration without foreign body, left lower leg, subsequent encounter: Secondary | ICD-10-CM

## 2024-01-22 NOTE — Progress Notes (Signed)
   Acute Office Visit  Subjective:     Patient ID: [name], female    DOB: 03/29/1928, 89 y.o.   MRN: [id number]  Chief Complaint  Patient presents with   ABRASION FOLLOW UP    1 day abrasion follow up on lower left leg and right arm/wrist    HPI Patient is in today for f/u skin tear to R forearm and L leg.  Patient fell on 01/20/2024 while trying to retrieve a flashlight.  Wound to right forearm and left leg was cleaned and dressed in office.  01/21/2024-patient was seen again in office yesterday for dressing changes. Was prescribed bactroban.   Today the patient reports that she has not had any subsequent falls.    ROS      Objective:    BP (!) 153/61   Pulse 66   Temp 97.9 F (36.6 C)   Ht 5' 3 (1.6 m)   Wt 98 lb (44.5 kg)   SpO2 93%   BMI 17.36 kg/m    Physical Exam Vitals reviewed.  Constitutional:      Appearance: Normal appearance.  HENT:     Head: Normocephalic and atraumatic.  Eyes:     Extraocular Movements: Extraocular movements intact.     Conjunctiva/sclera: Conjunctivae normal.     Pupils: Pupils are equal, round, and reactive to light.  Cardiovascular:     Rate and Rhythm: Normal rate and regular rhythm.     Pulses: Normal pulses.     Heart sounds: Normal heart sounds. No murmur heard. Pulmonary:     Effort: Pulmonary effort is normal. No respiratory distress.     Breath sounds: Normal breath sounds.  Musculoskeletal:        General: Normal range of motion.     Cervical back: Normal range of motion.  Skin:    Capillary Refill: Capillary refill takes less than 2 seconds.      Neurological:     General: No focal deficit present.     Mental Status: She is alert and oriented to person, place, and time.  Psychiatric:        Mood and Affect: Mood normal.        Behavior: Behavior normal.     No results found for any visits on 01/22/24.      Assessment & Plan:   Problem List Items Addressed This Visit   None Visit Diagnoses        Skin tear of right forearm without complication, subsequent encounter    -  Primary     Noninfected skin tear of left lower extremity, subsequent encounter          Skin tears to R forearm and LLE - no signs of infection. Wounds redressed today.   F/u 01/25/24 for wound recheck and redressing or sooner for any concerns.   No orders of the defined types were placed in this encounter.   Return in 3 days (on 01/25/2024) for dressing changes.  Oneil LELON Severin, FNP

## 2024-01-24 ENCOUNTER — Encounter: Payer: Self-pay | Admitting: Family Medicine

## 2024-01-25 ENCOUNTER — Ambulatory Visit (INDEPENDENT_AMBULATORY_CARE_PROVIDER_SITE_OTHER): Admitting: Family Medicine

## 2024-01-25 ENCOUNTER — Encounter: Payer: Self-pay | Admitting: Family Medicine

## 2024-01-25 VITALS — BP 146/61 | HR 61 | Temp 98.0°F | Wt 95.0 lb

## 2024-01-25 DIAGNOSIS — M545 Low back pain, unspecified: Secondary | ICD-10-CM

## 2024-01-25 DIAGNOSIS — S81812D Laceration without foreign body, left lower leg, subsequent encounter: Secondary | ICD-10-CM | POA: Diagnosis not present

## 2024-01-25 DIAGNOSIS — T50905A Adverse effect of unspecified drugs, medicaments and biological substances, initial encounter: Secondary | ICD-10-CM | POA: Diagnosis not present

## 2024-01-25 DIAGNOSIS — S51811D Laceration without foreign body of right forearm, subsequent encounter: Secondary | ICD-10-CM

## 2024-01-25 NOTE — Progress Notes (Signed)
 Subjective:  Patient ID: [name], female    DOB: Dec 17, 1928  Age: 89 y.o. MRN: [id number]  CC: No chief complaint on file.   HPI  Discussed the use of AI scribe software for clinical note transcription with the patient, who gave verbal consent to proceed.  History of Present Illness [name] is a 89 year old female who presents with issues related to wound care management.  She is experiencing difficulties with wound care management, specifically with the bandage on her wound being difficult to remove and causing slight bleeding. Over the weekend, she was unable to change the bandage herself, and it was changed by a nurse on Friday.  She is allergic to sulfa, which limits the use of certain topical treatments like Silvadene. The current bandage material is sticky and causes discomfort when removed. She has been using non-stick Telfa bandages, which seem to work better. There is a discussion about using sterile water or saline for cleaning the wound.  She had a negative reaction to a medication prescribed for her back pain, which made her unable to walk or talk properly, and she felt disoriented.  She typically takes her nighttime medications with supper, which she finds beneficial. There is a concern about taking medications that cause drowsiness too close to bedtime, but she reports not taking any such medications in the evening.          04/20/2023   11:19 AM 04/01/2023    2:41 PM 03/27/2023    4:16 PM  Depression screen PHQ 2/9  Decreased Interest 0 0 0  Down, Depressed, Hopeless 0 0 0  PHQ - 2 Score 0 0 0  Altered sleeping 0 0 0  Tired, decreased energy  0 0  Change in appetite  0 0  Feeling bad or failure about yourself   0 0  Trouble concentrating  0 0  Moving slowly or fidgety/restless  0 0  Suicidal thoughts  0 0  PHQ-9 Score 0  0  0   Difficult doing work/chores  Not difficult at all Not difficult at all     Data saved with a previous flowsheet row  definition    History [name] has a past medical history of Acute kidney injury superimposed on chronic kidney disease stage IIIa (HCC) (05/23/2022), Arthritis, Atrial fibrillation (HCC), COVID-19, CVA (cerebral vascular accident) (HCC), HTN (hypertension), MR (mitral regurgitation), NSTEMI (non-ST elevated myocardial infarction) (HCC), Orthostatic hypotension (11/06/2011), Pericarditis, and PNA (pneumonia).   She has a past surgical history that includes anterior repair with perigee graft (04/16/06); Anterior and posterior vaginal repair (10/15/04); sacral spinous ligament suspension of vagina; suprapubic cystectomy; Total abdominal hysterectomy w/ bilateral salpingoophorectomy; and Mitral valve replacement.   Her family history includes Colon cancer in her brother; Heart attack in her father; Heart disease in her brother, father, and mother; Lupus in her father.She reports that she has never smoked. She has never used smokeless tobacco. She reports that she does not drink alcohol and does not use drugs.    ROS Review of Systems  Constitutional: Negative.   HENT: Negative.    Eyes:  Negative for visual disturbance.  Respiratory:  Negative for shortness of breath.   Cardiovascular:  Negative for chest pain.  Gastrointestinal:  Negative for abdominal pain.  Musculoskeletal:  Positive for back pain. Negative for arthralgias.  Psychiatric/Behavioral:  Positive for confusion (caused by pregabalin. Had to DC after 2 doses).     Objective:  BP (!) 146/61  Pulse 61   Temp 98 F (36.7 C)   Wt 95 lb (43.1 kg)   SpO2 97%   BMI 16.83 kg/m   BP Readings from Last 3 Encounters:  01/25/24 (!) 146/61  01/22/24 (!) 153/61  01/21/24 134/76    Wt Readings from Last 3 Encounters:  01/25/24 95 lb (43.1 kg)  01/22/24 98 lb (44.5 kg)  01/21/24 95 lb (43.1 kg)     Physical Exam Physical Exam GENERAL: Alert, cooperative, well developed, no acute distress HEENT: Normocephalic, normal oropharynx,  moist mucous membranes CHEST: Clear to auscultation bilaterally, No wheezes, rhonchi, or crackles CARDIOVASCULAR: Normal heart rate and rhythm, S1 and S2 normal without murmurs ABDOMEN: Soft, non-tender, non-distended, without organomegaly, Normal bowel sounds EXTREMITIES: No cyanosis or edema at the right forearm there is a 6 cm skin tear with crescent moon shape. Dressing removed revealing deep dermis and early formation of granulation tissue. Similarly the left shin tear is crescent shaped and 7.5 cm in length. Granulation again noted. No S&S infection noted.  NEUROLOGICAL: Cranial nerves grossly intact, Moves all extremities without gross motor or sensory deficit   Assessment & Plan:  Skin tear of right forearm without complication, subsequent encounter  Noninfected skin tear of left lower extremity, subsequent encounter  Lumbar pain  Medication reaction, initial encounter    Assessment and Plan Assessment & Plan Noninfected skin tears of left leg and right forearm   Noninfected skin tears on the left leg and right forearm present challenges with bandage changes due to stickiness and bleeding. An allergy to sulfa precludes the use of Silvadene. Home health nurse involvement is uncertain due to coverage issues. Use nonstick Telfa for bandaging to prevent stickiness. Clean the area with sterile water or saline before bandaging. Coordinated with home health nurse Nathanel for daily bandage changes. A bandage change appointment is scheduled for tomorrow at 4:00 PM or later if necessary.  Lumbar pain   Lumbar pain is managed with pregabalin 75 mg at bedtime. Previous adverse effects included drowsiness and impaired mobility. Taking the medication with supper is preferred for better tolerance. . Monitor for drowsiness and ensure safety when moving around, especially at night. Discontinue pregabalin due to confusion    Home health to start making dressing changes. Orders sent.   Follow-up:  Return in about 3 days (around 01/28/2024).  Butler Der, M.D.

## 2024-01-25 NOTE — Progress Notes (Signed)
 [facility] called & VO given for wound care instructions

## 2024-01-26 ENCOUNTER — Ambulatory Visit (INDEPENDENT_AMBULATORY_CARE_PROVIDER_SITE_OTHER): Admitting: Family Medicine

## 2024-01-26 VITALS — BP 131/74 | HR 69 | Temp 97.6°F | Wt 95.0 lb

## 2024-01-26 DIAGNOSIS — S51811D Laceration without foreign body of right forearm, subsequent encounter: Secondary | ICD-10-CM | POA: Diagnosis not present

## 2024-01-26 DIAGNOSIS — S81812D Laceration without foreign body, left lower leg, subsequent encounter: Secondary | ICD-10-CM | POA: Diagnosis not present

## 2024-01-26 NOTE — Progress Notes (Signed)
 Subjective:  Patient ID: [name], female    DOB: 25-Mar-1928  Age: 89 y.o. MRN: [id number]  CC: No chief complaint on file.   HPI  Discussed the use of AI scribe software for clinical note transcription with the patient, who gave verbal consent to proceed.  History of Present Illness [name] is a 89 year old female who presents with concerns about wound care management.  She is concerned about the management of her wound care, as the nurse has not yet visited her home despite the order being placed. She is uncertain about the frequency of the nurse's visits and notes that she may not occur daily as she had hoped.  She experiences aching and pain and has been changing the dressings herself. She is worried about causing further damage when removing the dressings, as she did the previous day.          04/20/2023   11:19 AM 04/01/2023    2:41 PM 03/27/2023    4:16 PM  Depression screen PHQ 2/9  Decreased Interest 0 0 0  Down, Depressed, Hopeless 0 0 0  PHQ - 2 Score 0 0 0  Altered sleeping 0 0 0  Tired, decreased energy  0 0  Change in appetite  0 0  Feeling bad or failure about yourself   0 0  Trouble concentrating  0 0  Moving slowly or fidgety/restless  0 0  Suicidal thoughts  0 0  PHQ-9 Score 0  0  0   Difficult doing work/chores  Not difficult at all Not difficult at all     Data saved with a previous flowsheet row definition    History [name] has a past medical history of Acute kidney injury superimposed on chronic kidney disease stage IIIa (HCC) (05/23/2022), Arthritis, Atrial fibrillation (HCC), COVID-19, CVA (cerebral vascular accident) (HCC), HTN (hypertension), MR (mitral regurgitation), NSTEMI (non-ST elevated myocardial infarction) (HCC), Orthostatic hypotension (11/06/2011), Pericarditis, and PNA (pneumonia).   She has a past surgical history that includes anterior repair with perigee graft (04/16/06); Anterior and posterior vaginal repair  (10/15/04); sacral spinous ligament suspension of vagina; suprapubic cystectomy; Total abdominal hysterectomy w/ bilateral salpingoophorectomy; and Mitral valve replacement.   Her family history includes Colon cancer in her brother; Heart attack in her father; Heart disease in her brother, father, and mother; Lupus in her father.She reports that she has never smoked. She has never used smokeless tobacco. She reports that she does not drink alcohol and does not use drugs.    ROS Review of Systems  Objective:  BP 131/74   Pulse 69   Temp 97.6 F (36.4 C)   Wt 95 lb (43.1 kg)   SpO2 97%   BMI 16.83 kg/m   BP Readings from Last 3 Encounters:  01/26/24 131/74  01/25/24 (!) 146/61  01/22/24 (!) 153/61    Wt Readings from Last 3 Encounters:  01/26/24 95 lb (43.1 kg)  01/25/24 95 lb (43.1 kg)  01/22/24 98 lb (44.5 kg)     Physical Exam Physical Exam GENERAL: Alert, cooperative, well developed, no acute distress. Open skin tears below. Noted are punctate areas of granulation. No s&s infection.      Assessment & Plan:  Skin tear of right forearm without complication, subsequent encounter  Noninfected skin tear of left lower extremity, subsequent encounter    Assessment and Plan Assessment & Plan Noninfected skin tears of left leg and right forearm   Healing slowly with new skin  formation and no infection. Requires careful management to prevent further tearing. Clean wounds with warm soapy water, rinse thoroughly, and pat dry. Apply a thick layer of prescribed ointment. Cover with Adaptic dressing and wrap securely.     Home health to do dressings starting tomorrow  Follow-up: Return in about 1 week (around 02/02/2024).  Butler Der, M.D.

## 2024-01-27 ENCOUNTER — Other Ambulatory Visit: Payer: Self-pay | Admitting: Family Medicine

## 2024-01-27 ENCOUNTER — Encounter: Payer: Self-pay | Admitting: Family Medicine

## 2024-01-27 ENCOUNTER — Ambulatory Visit: Admitting: Family Medicine

## 2024-01-27 ENCOUNTER — Ambulatory Visit: Admitting: Urology

## 2024-01-28 ENCOUNTER — Telehealth: Payer: Self-pay | Admitting: Family Medicine

## 2024-01-28 NOTE — Telephone Encounter (Unsigned)
 Copied from CRM [id number]. Topic: Appointments - Appointment Info/Confirmation >> Jan 28, 2024  7:58 AM Deaijah H wrote: Patients son called in to find out what time his mom nurse visit to fix wound is for 11/21. No info in system please call to confirm Please call [phone]. Would like to know if they can come again tomorrow 1pm

## 2024-01-28 NOTE — Telephone Encounter (Signed)
 Son was informed home health is taking over the wound care. LS

## 2024-01-29 ENCOUNTER — Telehealth: Payer: Self-pay

## 2024-01-29 NOTE — Telephone Encounter (Signed)
 Copied from CRM [id number]. Topic: Clinical - Medical Advice >> Jan 29, 2024  1:39 PM Everette C wrote: Reason for CRM: The patient's home health nurse Brittany with [facility] has called for additional clarity regarding directions and continuation of the patient's prescription for mirtazapine  (REMERON ) 7.5 MG tablet [[id number]]  Brittany would also like to further discuss the patient's recent orders for wound care and potential medications from traditional bandaging to an order for a MedFlex foam treatment.   Please contact further at [phone]

## 2024-02-01 NOTE — Telephone Encounter (Signed)
 Left message on voice for a returned call. LS

## 2024-02-02 ENCOUNTER — Telehealth: Payer: Self-pay

## 2024-02-02 NOTE — Telephone Encounter (Signed)
 Attempted to call daughter back. Calling because pt is needing to sign a paper where she received a tetanus shot from her recent fall. Paper will be mailed and it can be brought back to office.

## 2024-02-02 NOTE — Telephone Encounter (Signed)
 Copied from CRM #[id number]. Topic: General - Call Back - No Documentation >> Feb 02, 2024  2:12 PM Tobias L wrote: Reason for CRM: Daughter returning call to office. Daughter unsure what the call was in regards to will try to call back tomorrow during her break.

## 2024-02-03 NOTE — Telephone Encounter (Signed)
 Patient's daughter returning call. Advised of message. Will have patient sign and return paper to office.

## 2024-02-08 NOTE — Telephone Encounter (Signed)
Noted  -LS

## 2024-02-09 ENCOUNTER — Other Ambulatory Visit: Payer: Self-pay | Admitting: Family Medicine

## 2024-02-09 NOTE — Telephone Encounter (Unsigned)
 Copied from CRM #[id number]. Topic: Clinical - Medication Refill >> Feb 09, 2024  9:06 AM Cherylann RAMAN wrote: Medication: mirtazapine  (REMERON ) 7.5 MG tablet  Has the patient contacted their pharmacy? Yes (Agent: If no, request that the patient contact the pharmacy for the refill. If patient does not wish to contact the pharmacy document the reason why and proceed with request.) (Agent: If yes, when and what did the pharmacy advise?)  This is the patient's preferred pharmacy:  [facility] [place], [place] - [address] [place] [address] Phone: [phone] Fax: [phone]  Is this the correct pharmacy for this prescription? Yes If no, delete pharmacy and type the correct one.   Has the prescription been filled recently? No  Is the patient out of the medication? Yes  Has the patient been seen for an appointment in the last year OR does the patient have an upcoming appointment? Yes  Can we respond through MyChart? No  Agent: Please be advised that Rx refills may take up to 3 business days. We ask that you follow-up with your pharmacy.

## 2024-02-10 ENCOUNTER — Other Ambulatory Visit: Payer: Self-pay | Admitting: Family Medicine

## 2024-02-10 DIAGNOSIS — I10 Essential (primary) hypertension: Secondary | ICD-10-CM

## 2024-02-12 ENCOUNTER — Telehealth: Payer: Self-pay

## 2024-02-12 NOTE — Telephone Encounter (Signed)
 Copied from CRM [id number]. Topic: Clinical - Home Health Verbal Orders >> Feb 12, 2024 10:29 AM Sophia H wrote: Caller/Agency: [facility] Callback Number: [phone] (vmail secured) Service Requested: Physical Therapy Frequency:  Re certify and extend - 1x week for 6 weeks  Any new concerns about the patient? No

## 2024-02-12 NOTE — Telephone Encounter (Signed)
 Left message to call back.

## 2024-02-12 NOTE — Telephone Encounter (Signed)
 Contacted Allie - Physical Therapist - [facility]. Left message on secured voicemail giving verbal orders to recertify and extend 1week6.

## 2024-02-17 ENCOUNTER — Ambulatory Visit: Admitting: Family Medicine

## 2024-02-17 ENCOUNTER — Encounter: Payer: Self-pay | Admitting: Family Medicine

## 2024-02-17 VITALS — BP 133/68 | HR 66 | Temp 97.1°F | Ht 63.0 in | Wt 100.0 lb

## 2024-02-17 DIAGNOSIS — S51811D Laceration without foreign body of right forearm, subsequent encounter: Secondary | ICD-10-CM

## 2024-02-17 DIAGNOSIS — S81812D Laceration without foreign body, left lower leg, subsequent encounter: Secondary | ICD-10-CM

## 2024-02-17 DIAGNOSIS — E782 Mixed hyperlipidemia: Secondary | ICD-10-CM

## 2024-02-17 NOTE — Progress Notes (Signed)
 Subjective:  Patient ID: [name], female    DOB: 15-Sep-1928  Age: 89 y.o. MRN: [id number]  CC: wound care (Right arm wound care)   HPI  Discussed the use of AI scribe software for clinical note transcription with the patient, who gave verbal consent to proceed.  History of Present Illness [name] is a 89 year old female who presents for follow-up of her arm and leg wounds.  Her leg wound is reportedly healing well. The skin on her leg is 'rolling up' and may require clipping.  The arm wound presents similar concerns regarding the skin potentially needing to be clipped. It is expected that the wound might dry up and crust over, facilitating easier management in the future. The current approach is to monitor the healing process and reassess in a couple of weeks.  She is currently using Neosporin and gauze on the wounds, with dressing changes advised daily for one wound and every other day for the other. She maintains wound hygiene with warm soapy water and uses a tubular net to secure the dressing.  Regarding her medication, she has been taking rosuvastatin daily. There is mention of atorvastatin  and fenofibrate, but it is unclear if she is currently taking these medications.          02/17/2024    9:42 AM 04/20/2023   11:19 AM 04/01/2023    2:41 PM  Depression screen PHQ 2/9  Decreased Interest 0 0 0  Down, Depressed, Hopeless 0 0 0  PHQ - 2 Score 0 0 0  Altered sleeping 0 0 0  Tired, decreased energy 1  0  Change in appetite 0  0  Feeling bad or failure about yourself  0  0  Trouble concentrating 0  0  Moving slowly or fidgety/restless 0  0  Suicidal thoughts 0  0  PHQ-9 Score 1 0  0   Difficult doing work/chores Not difficult at all  Not difficult at all     Data saved with a previous flowsheet row definition    History [name] has a past medical history of Acute kidney injury superimposed on chronic kidney disease stage IIIa (HCC) (05/23/2022),  Arthritis, Atrial fibrillation (HCC), COVID-19, CVA (cerebral vascular accident) (HCC), HTN (hypertension), MR (mitral regurgitation), NSTEMI (non-ST elevated myocardial infarction) (HCC), Orthostatic hypotension (11/06/2011), Pericarditis, and PNA (pneumonia).   She has a past surgical history that includes anterior repair with perigee graft (04/16/06); Anterior and posterior vaginal repair (10/15/04); sacral spinous ligament suspension of vagina; suprapubic cystectomy; Total abdominal hysterectomy w/ bilateral salpingoophorectomy; and Mitral valve replacement.   Her family history includes Colon cancer in her brother; Heart attack in her father; Heart disease in her brother, father, and mother; Lupus in her father.She reports that she has never smoked. She has never used smokeless tobacco. She reports that she does not drink alcohol and does not use drugs.    ROS Review of Systems  Constitutional: Negative.   HENT:  Negative for congestion.   Eyes:  Negative for visual disturbance.  Respiratory:  Negative for shortness of breath.   Cardiovascular:  Negative for chest pain.  Gastrointestinal:  Negative for abdominal pain, constipation, diarrhea, nausea and vomiting.  Genitourinary:  Negative for difficulty urinating.  Musculoskeletal:  Negative for arthralgias and myalgias.  Neurological:  Negative for headaches.  Psychiatric/Behavioral:  Negative for sleep disturbance.     Objective:  BP 133/68   Pulse 66   Temp (!) 97.1 F (36.2 C)  Ht 5' 3 (1.6 m)   Wt 100 lb (45.4 kg)   SpO2 92%   BMI 17.71 kg/m   BP Readings from Last 3 Encounters:  02/17/24 133/68  01/26/24 131/74  01/25/24 (!) 146/61    Wt Readings from Last 3 Encounters:  02/17/24 100 lb (45.4 kg)  01/26/24 95 lb (43.1 kg)  01/25/24 95 lb (43.1 kg)     Physical Exam Cardiovascular:     Rate and Rhythm: Normal rate and regular rhythm.  Skin:    Findings: Lesion present.    Physical Exam GENERAL: Alert,  cooperative, well developed, no acute distress. HEENT: Normocephalic, normal oropharynx, moist mucous membranes. CHEST: Clear to auscultation bilaterally, no wheezes, rhonchi, or crackles. CARDIOVASCULAR: Normal heart rate and rhythm, S1 and S2 normal without murmurs. ABDOMEN: Soft, non-tender, non-distended, without organomegaly, normal bowel sounds. EXTREMITIES: No cyanosis or edema. Skin on leg and arm with rolling edges, advised against immediate clipping. NEUROLOGICAL: Cranial nerves grossly intact, moves all extremities without gross motor or sensory deficit.   Assessment & Plan:  There are no diagnoses linked to this encounter.  Assessment and Plan Assessment & Plan Noninfected skin tears of left leg and right forearm   The skin tear on her left leg is healing well, but the right forearm tear is not ready for clipping due to its premature status. The skin on the forearm is rolling up and is expected to dry and crust over, allowing for painless clipping. The dressing on her leg is too tight and may cause skin damage. She has a follow-up appointment on December 23rd at 2:25 PM to reassess the skin tears. Change the dressing on the right forearm daily and on the left leg every other day. Clean wounds with warm soapy water, rinse with clear water, pat dry, apply Neosporin, and cover with thick gauze. Ensure the dressing is not too tight to prevent skin damage. Consider using a tubular net to hold the dressing in place.  Hyperlipidemia   She is currently taking rosuvastatin daily. Fenofibrate has not been started. Lipid levels remain elevated, which may require medication adjustments. Continue rosuvastatin daily and consider medication adjustments if lipid levels remain elevated.       Follow-up: No follow-ups on file.  Butler Der, M.D.

## 2024-02-18 NOTE — Telephone Encounter (Signed)
 Left message to call back. 3rd attempt. Phone encounter closed

## 2024-02-22 NOTE — Telephone Encounter (Signed)
 closed

## 2024-02-23 ENCOUNTER — Telehealth: Payer: Self-pay | Admitting: Family Medicine

## 2024-02-23 ENCOUNTER — Other Ambulatory Visit: Payer: Self-pay | Admitting: Family Medicine

## 2024-02-23 DIAGNOSIS — J309 Allergic rhinitis, unspecified: Secondary | ICD-10-CM

## 2024-02-23 MED ORDER — MIRTAZAPINE 7.5 MG PO TABS: 7.5000 mg | ORAL_TABLET | Freq: Every day | ORAL | 1 refills | Status: AC

## 2024-02-23 MED ORDER — FLUTICASONE PROPIONATE 50 MCG/ACT NA SUSP: 2.0000 | Freq: Every day | NASAL | 6 refills | Status: AC

## 2024-02-23 NOTE — Telephone Encounter (Signed)
 Please let the patient know that I sent their prescription to their pharmacy. Thanks, WS

## 2024-02-23 NOTE — Telephone Encounter (Signed)
 For what purpose is she taking it? I Dced it tat the last visit because pt. & caregiver didn't know.

## 2024-02-23 NOTE — Telephone Encounter (Signed)
 Copied from CRM #[id number]. Topic: Clinical - Medication Question >> Feb 23, 2024  9:40 AM Mia F wrote: Reason for CRM: Nurse Brittany from [facility] Pt Flomax  says she does not have and refills left and the mirtazapine  (REMERON ) 7.5 MG tablet was not appropriate to fill. According to the notes on the refill requests pt need to contact provider first. Please contact pt or nurse Brittany regarding this medication refill request    [phone] (Brittany)

## 2024-02-23 NOTE — Telephone Encounter (Signed)
 Spoke to home health. They need flomax  sent in as well.

## 2024-02-24 NOTE — Telephone Encounter (Signed)
 Left message for a call back to see why flomax  was needed? Discontinued during last visit. LS

## 2024-02-29 NOTE — Telephone Encounter (Signed)
 Apt tomorrow. LS

## 2024-03-01 ENCOUNTER — Encounter: Payer: Self-pay | Admitting: Family Medicine

## 2024-03-01 ENCOUNTER — Ambulatory Visit: Admitting: Family Medicine

## 2024-03-01 ENCOUNTER — Other Ambulatory Visit: Payer: Self-pay | Admitting: Family Medicine

## 2024-03-01 ENCOUNTER — Other Ambulatory Visit (HOSPITAL_COMMUNITY): Payer: Self-pay

## 2024-03-01 VITALS — BP 177/78 | HR 60 | Temp 97.2°F | Ht 63.0 in | Wt 98.0 lb

## 2024-03-01 DIAGNOSIS — S81812D Laceration without foreign body, left lower leg, subsequent encounter: Secondary | ICD-10-CM

## 2024-03-01 DIAGNOSIS — S51811D Laceration without foreign body of right forearm, subsequent encounter: Secondary | ICD-10-CM | POA: Diagnosis not present

## 2024-03-01 NOTE — Progress Notes (Signed)
 Chief Complaint  Patient presents with   Wound Check    Pt reports she is doing good.  Should she abe taking flomax ?   night terrors    Having night terrors and sleep walked once.Wakes up panicked.     HPI  Patient presents today for Discussed the use of AI scribe software for clinical note transcription with the patient, who gave verbal consent to proceed.  History of Present Illness [name] is a 89 year old female who presents for a wound check. She is accompanied by her daughter.  She has been managing a wound on her right wrist by wrapping it daily, and it is reportedly doing well. The left knee has also been wrapped, but the wrappings frequently fall off. She inquires if she can discontinue the wrappings on the left knee. Both areas are not painful and are not causing any symptoms, as she is waiting for the healing to complete.  She has recently stopped taking mirtazapine  and has experienced an episode of sleepwalking. During this episode, she recalls walking around trying to find her mother, who passed away many years ago. Her daughter is concerned and inquires if medication is needed for this issue. She mentions that this was a one-time occurrence. She occasionally has trouble sleeping but manages it by taking Tylenol , which helps her relax and sleep through the night.  There is a question regarding the use of tamsulosin , which was prescribed during a hospital stay.     PMH: Smoking status noted Review of Systems  Constitutional: Negative.   HENT:  Negative for congestion.   Eyes:  Negative for visual disturbance.  Respiratory:  Negative for shortness of breath.   Cardiovascular:  Negative for chest pain.  Gastrointestinal:  Negative for abdominal pain, constipation, diarrhea, nausea and vomiting.  Genitourinary:  Negative for difficulty urinating.  Musculoskeletal:  Negative for arthralgias and myalgias.  Neurological:  Negative for headaches.   Psychiatric/Behavioral:  Negative for sleep disturbance.     Objective: BP (!) 177/78   Pulse 60   Temp (!) 97.2 F (36.2 C)   Ht 5' 3 (1.6 m)   Wt 98 lb (44.5 kg)   SpO2 94%   BMI 17.36 kg/m  Gen: NAD, alert, cooperative with exam HEENT: NCAT, EOMI, PERRL CV: RRR, good S1/S2, no murmur Resp: CTABL, no wheezes, non-labored Ext: No edema, warm Neuro: Alert and oriented, No gross deficits  Skin tear of right forearm without complication, subsequent encounter  Noninfected skin tear of left lower extremity, subsequent encounter    Assessment and Plan Assessment & Plan Noninfected skin tears of left leg and right forearm   The right wrist wound has a dry brown crust with dried slough, but fresh skin is present underneath. The left knee wound shows fresh skin and is healing well. No signs of infection in either wound. Apply Neosporin and gauze to the right wrist. Discontinue dressings at the left knee as it is healing well.  Sleepwalking   She experienced a single episode of sleepwalking, possibly related to discontinuation of mirtazapine . She has difficulty sleeping at times but manages with Tylenol . Continue current management with Tylenol  for sleep disturbances.    Tamsulosin  DCed  Butler Der, MD

## 2024-03-30 ENCOUNTER — Ambulatory Visit

## 2024-03-30 DIAGNOSIS — J181 Lobar pneumonia, unspecified organism: Secondary | ICD-10-CM

## 2024-03-30 DIAGNOSIS — I252 Old myocardial infarction: Secondary | ICD-10-CM | POA: Diagnosis not present

## 2024-03-30 DIAGNOSIS — N184 Chronic kidney disease, stage 4 (severe): Secondary | ICD-10-CM

## 2024-03-30 DIAGNOSIS — S81802D Unspecified open wound, left lower leg, subsequent encounter: Secondary | ICD-10-CM

## 2024-03-30 DIAGNOSIS — I130 Hypertensive heart and chronic kidney disease with heart failure and stage 1 through stage 4 chronic kidney disease, or unspecified chronic kidney disease: Secondary | ICD-10-CM | POA: Diagnosis not present

## 2024-03-30 DIAGNOSIS — D631 Anemia in chronic kidney disease: Secondary | ICD-10-CM

## 2024-03-30 DIAGNOSIS — S51801D Unspecified open wound of right forearm, subsequent encounter: Secondary | ICD-10-CM

## 2024-03-30 DIAGNOSIS — F0390 Unspecified dementia without behavioral disturbance: Secondary | ICD-10-CM

## 2024-03-30 DIAGNOSIS — I4891 Unspecified atrial fibrillation: Secondary | ICD-10-CM | POA: Diagnosis not present

## 2024-03-30 DIAGNOSIS — I319 Disease of pericardium, unspecified: Secondary | ICD-10-CM

## 2024-03-30 DIAGNOSIS — I951 Orthostatic hypotension: Secondary | ICD-10-CM | POA: Diagnosis not present

## 2024-03-30 DIAGNOSIS — I5022 Chronic systolic (congestive) heart failure: Secondary | ICD-10-CM

## 2024-04-11 ENCOUNTER — Other Ambulatory Visit: Payer: Self-pay | Admitting: Family Medicine

## 2024-04-19 ENCOUNTER — Ambulatory Visit: Admitting: Physician Assistant

## 2024-05-10 ENCOUNTER — Ambulatory Visit

## 2024-05-16 ENCOUNTER — Ambulatory Visit

## 2024-05-18 ENCOUNTER — Ambulatory Visit: Payer: Self-pay
